# Patient Record
Sex: Male | Born: 1950 | Race: White | Hispanic: No | Marital: Married | Smoking: Current every day smoker
Health system: Southern US, Community
[De-identification: ages and names within clinical notes are randomized; demographics above are authoritative.]

## PROBLEM LIST (undated history)

## (undated) DIAGNOSIS — M1990 Unspecified osteoarthritis, unspecified site: Secondary | ICD-10-CM

## (undated) DIAGNOSIS — R1110 Vomiting, unspecified: Secondary | ICD-10-CM

## (undated) DIAGNOSIS — Z8049 Family history of malignant neoplasm of other genital organs: Secondary | ICD-10-CM

## (undated) DIAGNOSIS — S88112A Complete traumatic amputation at level between knee and ankle, left lower leg, initial encounter: Secondary | ICD-10-CM

## (undated) DIAGNOSIS — C801 Malignant (primary) neoplasm, unspecified: Secondary | ICD-10-CM

## (undated) DIAGNOSIS — R011 Cardiac murmur, unspecified: Secondary | ICD-10-CM

## (undated) DIAGNOSIS — D62 Acute posthemorrhagic anemia: Secondary | ICD-10-CM

## (undated) DIAGNOSIS — R739 Hyperglycemia, unspecified: Secondary | ICD-10-CM

## (undated) DIAGNOSIS — M2550 Pain in unspecified joint: Secondary | ICD-10-CM

## (undated) DIAGNOSIS — I10 Essential (primary) hypertension: Secondary | ICD-10-CM

## (undated) DIAGNOSIS — E119 Type 2 diabetes mellitus without complications: Secondary | ICD-10-CM

## (undated) HISTORY — PX: TONSILLECTOMY: SUR1361

## (undated) HISTORY — DX: Pain in unspecified joint: M25.50

## (undated) HISTORY — PX: ELBOW SURGERY: SHX618

## (undated) HISTORY — DX: Family history of malignant neoplasm of other genital organs: Z80.49

## (undated) HISTORY — DX: Vomiting, unspecified: R11.10

## (undated) HISTORY — DX: Essential (primary) hypertension: I10

## (undated) HISTORY — DX: Hyperglycemia, unspecified: R73.9

## (undated) HISTORY — DX: Acute posthemorrhagic anemia: D62

## (undated) HISTORY — DX: Complete traumatic amputation at level between knee and ankle, left lower leg, initial encounter: S88.112A

---

## 2006-12-21 ENCOUNTER — Emergency Department (HOSPITAL_COMMUNITY): Admission: EM | Admit: 2006-12-21 | Discharge: 2006-12-22 | Payer: Self-pay | Admitting: Emergency Medicine

## 2007-03-12 ENCOUNTER — Emergency Department (HOSPITAL_COMMUNITY): Admission: EM | Admit: 2007-03-12 | Discharge: 2007-03-12 | Payer: Self-pay | Admitting: Emergency Medicine

## 2008-02-29 ENCOUNTER — Observation Stay (HOSPITAL_COMMUNITY): Admission: EM | Admit: 2008-02-29 | Discharge: 2008-03-03 | Payer: Self-pay | Admitting: Emergency Medicine

## 2008-09-26 ENCOUNTER — Emergency Department (HOSPITAL_COMMUNITY): Admission: EM | Admit: 2008-09-26 | Discharge: 2008-09-26 | Payer: Self-pay | Admitting: Emergency Medicine

## 2011-02-07 NOTE — H&P (Signed)
NAME:  [name], [name] NO.:  [id number]   MEDICAL RECORD NO.:  [id number]          PATIENT TYPE:  EMS   LOCATION:  ED                            FACILITY:  [facility]   PHYSICIAN:  Osvaldo Shipper, MD     DATE OF BIRTH:  1951/01/12   DATE OF ADMISSION:  02/29/2008  DATE OF DISCHARGE:  LH                              HISTORY & PHYSICAL   PRIMARY CARE PHYSICIAN:  None.   ADMISSION DIAGNOSES:  1. Fall, resulting in left 7th rib fracture.  2. Possible bilateral pneumonia.   CHIEF COMPLAINT:  Fall and left-sided chest pain.   HISTORY OF PRESENT ILLNESS:  The patient is a 60 year old Caucasian male  who has really no past medical history.  He was in his usual state of  health yesterday when he was getting out of his bathtub and he slipped  and fell in his bathroom.  There was no syncopal episode associated.  He  fell on his left chest.  His pain started sometime overnight, and he  decided to come in today for further evaluation.  The patient admits to  having pain in the left side, about 8 or 10 in intensity.  This is worse  with deep breathing.  He admits to having a cough with expectoration.  He does admit to some shortness of breath.  Denies any fever or chills.  No sick contacts.   MEDICATIONS AT HOME:  None.   ALLERGIES:  NO KNOWN DRUG ALLERGIES.   PAST MEDICAL HISTORY:  Really none.  He has had surgery for a left elbow  fracture many years ago.   SOCIAL HISTORY:  He lives alone in [place].  He works as a Designer, fashion/clothing.  He smokes half a pack of cigarettes on a daily basis.  He consumes a 40  ounce bottle of beer on a daily basis; his last consumption was last  night.  He denies any illicit drug use.   FAMILY HISTORY:  Sister died of unknown cancer.   REVIEW OF SYSTEMS:  GENERAL:  Positive for weakness.  HEENT:  Unremarkable. CARDIOVASCULAR:  Unremarkable.  RESPIRATORY:  As in HPI.  GI:  Unremarkable.  GU:  Unremarkable.  MUSCULOSKELETAL:  Unremarkable.  NEUROLOGIC:  Unremarkable.  PSYCHIATRIC:  Unremarkable.   PHYSICAL EXAMINATION:  VITAL SIGNS:  Temperature 99.4, blood pressure  145/84, heart rate initially 124 and subsequently 91, respiratory rate  22, saturations 92% on room air and 99% on 2 liters.  GENERAL:  This is a well developed, well nourished white male in some  discomfort but in no distress.  HEENT:  There is no pallor.  No icterus.  Oral mucosa moist.  No oral  lesions are noted.  NECK:  Soft and supple.  No thyromegaly is appreciated.  LUNGS:  A few rhonchi bilaterally, but mostly clear to auscultation.  CARDIOVASCULAR:  S1 and S2 normal.  Regular.  No murmurs are  appreciated.  ABDOMEN:  Soft, nontender, nondistended.  Bowel sounds are present.  No  mass or organomegaly is appreciated.  EXTREMITIES:  Show no edema.  Peripheral pulses are palpable.  No  calf  tenderness is present.  NEUROLOGIC:  He is alert and oriented x3.  No focal neurological  deficits are present.   LABORATORY STUDIES:  White count 14,300, hemoglobin 16.6, platelet count  198, 90% neutrophils.  Sodium 134, glucose 188.  Chest x-ray showed mild  chronic bronchitic changes, nothing acute.  He had a CT done of his  abdomen and pelvis as well, which showed nondisplaced left lateral 7th  rib fracture, patchy airspace opacity in both lower lobes and in  appearance most compatible with pneumonia.   DIFFERENTIAL DIAGNOSIS:  Pulmonary contusion.  Minimal diffuse fatty  infiltration of the liver noted.  Moderate-sized right inguinal hernia  containing fluid.  No acute pelvic abnormalities.   ASSESSMENT AND PLAN:  This is a 60 year old Caucasian male who is fairly  healthy, though he does have some element of tobacco and alcohol abuse.  He fell yesterday on his left chest wall and was complaining of left  chest pain.  1. Evidence for left 7th rib fracture.  This is a result of the fall.      No pneumothorax is noted, though there could be some evidence  of      pulmonary contusion.  I think this warrants an observation      overnight to make sure he remains stable. A chest x-ray will be      repeated tomorrow morning, to make sure there is no pneumothorax.      Pain control with oxycodone will be provided.  Pain control will be      important to make sure he does not take shallow breaths, which      would only worsen his pneumonia. I will obviously put him on      Levaquin for this presumed pneumonia.  2. Alcoholism.  He consumes beer on a daily basis (40 ounce bottle).      I will put him on some Ativan and thiamine.  Will check his serum      alcohol levels.  3. Tobacco Abuse.  Put him on nicotine patch.   Sequential compressive devices will be utilized.  I hesitate in using  Lovenox because of the possibility of pulmonary contusion.  Labs will be  rechecked tomorrow morning.  Saturations will be checked on room air  tomorrow.   If he is stable, I think he can go home tomorrow.      Osvaldo Shipper, MD  Electronically Signed     GK/MEDQ  D:  02/29/2008  T:  02/29/2008  Job:  [id number]

## 2011-02-07 NOTE — Group Therapy Note (Signed)
NAME:  [name], [name] NO.:  [id number]   MEDICAL RECORD NO.:  [id number]          PATIENT TYPE:  OBV   LOCATION:  A308                          FACILITY:  [facility]   PHYSICIAN:  Margaretmary Dys, M.D.DATE OF BIRTH:  Dec 22, 1950   DATE OF PROCEDURE:  03/02/2008  DATE OF DISCHARGE:                                 PROGRESS NOTE   SUBJECTIVE:  The patient continues to have some pain and is having  difficulty coughing up sputum.  I am concerned that patient may develop  post atelectatic pneumonia if he does not improve his cough.   OBJECTIVE:  Conscious, alert, comfortable, not in acute distress.  Patient was generally on unkempt.  VITAL SIGNS:  Blood pressure is 127/83.  Pulse of 77.  Respiration 24.  Temperature 97 degrees Fahrenheit.  Oxygen saturation was 96% on room  air.  HEENT:  Normocephalic, atraumatic.  Oral mucosa was moist with no  exudates.  NECK:  Supple.  No JVD, no lymphadenopathy.  LUNGS:  Reduced air entry bilaterally.  Occasional crackles at the  bases.  HEART:  S1-S2 regular.  No S3, S4, gallops or rubs.  ABDOMEN:  Soft, nontender.  Bowel sounds positive.  EXTREMITIES:  No edema.   LABORATORIES/DIAGNOSTIC DATA:  White blood cell count is 9, hemoglobin  13.9, hematocrit 39.2, platelet count was 171, neutrophils 69%, sodium  136, potassium 3.3, chloride 100, CO2 was 31, glucose 170, BUN of 6,  creatinine was 0.65.  Blood cultures have remained negative thus far.   ASSESSMENT:  1. Bilateral lower lobe opacities from lung atelectasis due to      splinting of the chest after fracture ribs.  2. Chronic alcoholism and is generally unkempt patient.   PLAN:  1. We will continue antibiotic therapy with Levaquin.  His white blood      cell count has improved.  2. I again encouraged incentive spirometry.  3. We will continue with pain control.  I will continue with IV fluid      hydration.   DISPOSITION:  The patient will likely be discharged home  tomorrow  morning.      Margaretmary Dys, M.D.  Electronically Signed     AM/MEDQ  D:  03/02/2008  T:  03/02/2008  Job:  [id number]

## 2011-02-07 NOTE — Group Therapy Note (Signed)
NAME:  [name], [name] NO.:  [id number]   MEDICAL RECORD NO.:  [id number]          PATIENT TYPE:  OBV   LOCATION:  A308                          FACILITY:  [facility]   PHYSICIAN:  Margaretmary Dys, M.D.DATE OF BIRTH:  1951-03-05   DATE OF PROCEDURE:  03/01/2008  DATE OF DISCHARGE:                                 PROGRESS NOTE   SUBJECTIVE:  The patient feels slightly better.  He says there is some  more pain when he coughs.  The patient was admitted with a fractured  seventh rib and pneumonia.  The patient has significant atelectasis at  the bases which I suspect is due to splinting due to his fractured ribs.  The patient has been encouraged to use incentive spirometry.   OBJECTIVE:  Conscious, alert, comfortable, not in acute distress.  The  patient was generally unkempt.  VITAL SIGNS:  Blood pressure 137/72 with a pulse of 81, respirations 18,  temperature 98.2 degrees Fahrenheit, oxygen saturation ws 925 on room  air.  HEENT EXAM:  Normocephalic, atraumatic.  Oral mucosa was moist.  No  exudates.  NECK:  Supple.  No JVD or lymphadenopathy.  LUNGS:  Clear clinically.  HEART:  S1, S2, regular.  No S3, S4, gallops or rubs.  ABDOMEN:  Soft, nontender, bowel sounds are positive.  EXTREMITIES:  No edema.   LABORATORY/DIAGNOSTIC DATA:  His chest x-ray shows increasing bibasilar  opacities, possibly atelectasis.  White blood cell count is 15.7,  hemoglobin 14.5, hematocrit 40.6, platelet count was 178, with 81%  neutrophils.  Sodium was 137, potassium 3.7, chloride of 101, CO2 31,  glucose 185, BUN of 7, creatinine was 0.82.   ASSESSMENT AND PLAN:  1. Bilateral lower lobe opacities, possibly atelectasis from splinting      of the chest.  2. Chronic alcoholism.  3. The patient is generally unkempt.   PLAN:  1. Will continue current antibiotic therapy with Levaquin.  2. Will encourage incentive spirometry.  3. Will continue with pain control with oxycodone.  4.  Continue IV fluid hydration.  5. I discussed smoking cessation with him.   DISPOSITION:  The patient will likely be discharged home in the next 24-  48 hours.      Margaretmary Dys, M.D.  Electronically Signed     AM/MEDQ  D:  03/01/2008  T:  03/01/2008  Job:  [id number]

## 2011-04-22 ENCOUNTER — Emergency Department (HOSPITAL_COMMUNITY)
Admission: EM | Admit: 2011-04-22 | Discharge: 2011-04-22 | Disposition: A | Discharge status: Home / Self Care | Payer: Self-pay | Attending: Emergency Medicine | Admitting: Emergency Medicine

## 2011-04-22 ENCOUNTER — Encounter: Payer: Self-pay | Admitting: Emergency Medicine

## 2011-04-22 DIAGNOSIS — F17200 Nicotine dependence, unspecified, uncomplicated: Secondary | ICD-10-CM | POA: Insufficient documentation

## 2011-04-22 DIAGNOSIS — L723 Sebaceous cyst: Secondary | ICD-10-CM | POA: Insufficient documentation

## 2011-04-22 MED ORDER — DOXYCYCLINE HYCLATE 100 MG PO CAPS: 100.0000 mg | ORAL_CAPSULE | Freq: Two times a day (BID) | ORAL | Status: AC

## 2011-04-22 MED ORDER — DOXYCYCLINE HYCLATE 100 MG PO TABS
100.0000 mg | ORAL_TABLET | Freq: Once | ORAL | Status: AC
  Administered 2011-04-22: 100 mg via ORAL
  Filled 2011-04-22: qty 1

## 2011-04-22 NOTE — ED Notes (Signed)
Pt has a large abscess noted on left mid back. Pt first noted 2 weeks ago and has progressed into large painful abscess.

## 2011-04-22 NOTE — ED Provider Notes (Signed)
History     Chief Complaint  Patient presents with  . Abscess   Patient is a 60 y.o. male presenting with abscess. The history is provided by the patient. No language interpreter was used.  Abscess  This is a new problem. The current episode started more than one week ago. The onset was gradual. The problem has been gradually worsening. The abscess is present on the back. The problem is moderate. The abscess is characterized by swelling and painfulness. It is unknown what he was exposed to. The abscess first occurred at home.    History reviewed. No pertinent past medical history.  Past Surgical History  Procedure Date  . Elbow surgery     Bone graft to repair elbow    Family History  Problem Relation Age of Onset  . Cancer Sister     History  Substance Use Topics  . Smoking status: Current Everyday Smoker -- 0.5 packs/day    Types: Cigarettes  . Smokeless tobacco: Former Neurosurgeon    Types: Snuff    Quit date: 04/21/1989  . Alcohol Use: 7.2 oz/week    12 Cans of beer per week      Review of Systems  Skin:       Swollen area to L lower back.  All other systems reviewed and are negative.    Physical Exam  BP 154/96  Pulse 73  Temp(Src) 98 F (36.7 C) (Oral)  Resp 18  Ht 5\' 8"  (1.727 m)  Wt 194 lb (87.998 kg)  BMI 29.50 kg/m2  SpO2 97%  Physical Exam  Nursing note and vitals reviewed. Constitutional: He is oriented to person, place, and time. Vital signs are normal. He appears well-developed and well-nourished.  HENT:  Head: Normocephalic and atraumatic.  Right Ear: External ear normal.  Left Ear: External ear normal.  Nose: Nose normal.  Mouth/Throat: No oropharyngeal exudate.  Eyes: Conjunctivae and EOM are normal. Pupils are equal, round, and reactive to light. Right eye exhibits no discharge. Left eye exhibits no discharge. No scleral icterus.  Neck: Normal range of motion. Neck supple. No JVD present. No tracheal deviation present. No thyromegaly  present.  Cardiovascular: Normal rate, regular rhythm, normal heart sounds, intact distal pulses and normal pulses.  Exam reveals no gallop and no friction rub.   No murmur heard. Pulmonary/Chest: Effort normal and breath sounds normal. No stridor. No respiratory distress. He has no wheezes. He has no rales. He exhibits no tenderness.  Abdominal: Soft. Normal appearance and bowel sounds are normal. He exhibits no distension and no mass. There is no tenderness. There is no rebound and no guarding.  Musculoskeletal: Normal range of motion. He exhibits no edema and no tenderness.  Lymphadenopathy:    He has no cervical adenopathy.  Neurological: He is alert and oriented to person, place, and time. He has normal reflexes. No cranial nerve deficit. Coordination normal. GCS eye subscore is 4. GCS verbal subscore is 5. GCS motor subscore is 6.  Reflex Scores:      Tricep reflexes are 2+ on the right side and 2+ on the left side.      Bicep reflexes are 2+ on the right side and 2+ on the left side.      Brachioradialis reflexes are 2+ on the right side and 2+ on the left side.      Patellar reflexes are 2+ on the right side and 2+ on the left side.      Achilles reflexes are 2+ on  the right side and 2+ on the left side. Skin: Skin is warm and dry. No rash noted. He is not diaphoretic.     Psychiatric: He has a normal mood and affect. His speech is normal and behavior is normal. Judgment and thought content normal. Cognition and memory are normal.    ED Course  Procedures  MDM No distress      Worthy Rancher, PA 04/22/11 1818  Worthy Rancher, PA 04/22/11 1819  Worthy Rancher, PA 04/22/11 [phone]

## 2011-04-22 NOTE — ED Notes (Signed)
Patient c/o "knot" on back that appeared 1 week ago and has progressively gotten worse. Abscess not on mid left side of back. Patient denies any drainage or fevers. Area red and warm to touch.

## 2011-05-08 NOTE — ED Provider Notes (Signed)
Medical screening examination/treatment/procedure(s) were performed by non-physician practitioner and as supervising physician I was immediately available for consultation/collaboration.    Madicyn Mesina L Sherron Mapp, MD 05/08/11 0131 

## 2011-06-22 LAB — BASIC METABOLIC PANEL
CO2: 21
CO2: 31
Calcium: 8.6
Chloride: 101
Chloride: 102
Creatinine, Ser: 0.78
GFR calc Af Amer: >60
GFR calc Af Amer: >60
Glucose, Bld: 170 — ABNORMAL HIGH
Glucose, Bld: 188 — ABNORMAL HIGH
Potassium: 3.7
Sodium: 136
Sodium: 137

## 2011-06-22 LAB — CBC
HCT: 40.6
Hemoglobin: 13.9
Hemoglobin: 14.5
Hemoglobin: 16.6
MCHC: 35.5
MCHC: 36.1 — ABNORMAL HIGH
MCV: 91.8
MCV: 92.8
RBC: 4.37
RBC: 5.02
RDW: 13.3
RDW: 13.4
WBC: 15.7 — ABNORMAL HIGH

## 2011-06-22 LAB — DIFFERENTIAL
Basophils Absolute: 0.1
Basophils Relative: 0
Basophils Relative: 1
Eosinophils Absolute: 0
Eosinophils Absolute: 0.1
Eosinophils Relative: 1
Eosinophils Relative: 3
Lymphs Abs: 1.7
Monocytes Absolute: 0.8
Monocytes Absolute: 0.9
Monocytes Absolute: 1.2 — ABNORMAL HIGH
Monocytes Relative: 6
Monocytes Relative: 8
Neutro Abs: 6.2
Neutrophils Relative %: 81 — ABNORMAL HIGH

## 2011-06-22 LAB — CULTURE, BLOOD (ROUTINE X 2): Report Status: 6112009

## 2011-07-12 LAB — RAPID URINE DRUG SCREEN, HOSP PERFORMED
Opiates: NOT DETECTED
Tetrahydrocannabinol: NOT DETECTED

## 2011-07-12 LAB — POCT CARDIAC MARKERS
CKMB, poc: 3.4
Myoglobin, poc: 55.3
Operator id: 240821
Troponin i, poc: <0.05

## 2013-01-05 ENCOUNTER — Emergency Department (HOSPITAL_COMMUNITY)
Admission: EM | Admit: 2013-01-05 | Discharge: 2013-01-05 | Disposition: A | Discharge status: Home / Self Care | Payer: No Typology Code available for payment source | Attending: Emergency Medicine | Admitting: Emergency Medicine

## 2013-01-05 ENCOUNTER — Encounter (HOSPITAL_COMMUNITY): Payer: Self-pay | Admitting: *Deleted

## 2013-01-05 ENCOUNTER — Emergency Department (HOSPITAL_COMMUNITY): Payer: No Typology Code available for payment source

## 2013-01-05 DIAGNOSIS — R739 Hyperglycemia, unspecified: Secondary | ICD-10-CM

## 2013-01-05 DIAGNOSIS — R1110 Vomiting, unspecified: Secondary | ICD-10-CM | POA: Insufficient documentation

## 2013-01-05 DIAGNOSIS — I10 Essential (primary) hypertension: Secondary | ICD-10-CM

## 2013-01-05 DIAGNOSIS — M129 Arthropathy, unspecified: Secondary | ICD-10-CM | POA: Insufficient documentation

## 2013-01-05 DIAGNOSIS — Z9889 Other specified postprocedural states: Secondary | ICD-10-CM | POA: Insufficient documentation

## 2013-01-05 DIAGNOSIS — F17200 Nicotine dependence, unspecified, uncomplicated: Secondary | ICD-10-CM | POA: Insufficient documentation

## 2013-01-05 DIAGNOSIS — M1990 Unspecified osteoarthritis, unspecified site: Secondary | ICD-10-CM

## 2013-01-05 DIAGNOSIS — R7309 Other abnormal glucose: Secondary | ICD-10-CM | POA: Insufficient documentation

## 2013-01-05 DIAGNOSIS — R197 Diarrhea, unspecified: Secondary | ICD-10-CM | POA: Insufficient documentation

## 2013-01-05 DIAGNOSIS — R011 Cardiac murmur, unspecified: Secondary | ICD-10-CM

## 2013-01-05 LAB — CBC
MCH: 32.7 pg (ref 26.0–34.0)
MCHC: 35.3 g/dL (ref 30.0–36.0)
Platelets: 138 10*3/uL — ABNORMAL LOW (ref 150–400)
RBC: 5.6 MIL/uL (ref 4.22–5.81)
RDW: 14 % (ref 11.5–15.5)

## 2013-01-05 LAB — BASIC METABOLIC PANEL
Calcium: 9.5 mg/dL (ref 8.4–10.5)
Creatinine, Ser: 0.87 mg/dL (ref 0.50–1.35)
GFR calc Af Amer: >90 mL/min (ref >90)
GFR calc non Af Amer: >90 mL/min (ref >90)
Sodium: 137 mEq/L (ref 135–145)

## 2013-01-05 MED ORDER — HYDROCHLOROTHIAZIDE 12.5 MG PO CAPS
25.0000 mg | ORAL_CAPSULE | Freq: Every day | ORAL | Status: DC
  Filled 2013-01-05 (×2): qty 2

## 2013-01-05 MED ORDER — HYDROCHLOROTHIAZIDE 25 MG PO TABS: 25.0000 mg | ORAL_TABLET | Freq: Every day | ORAL | Status: DC

## 2013-01-05 MED ORDER — NAPROXEN 500 MG PO TABS: 500.0000 mg | ORAL_TABLET | Freq: Two times a day (BID) | ORAL | Status: DC

## 2013-01-05 MED ORDER — IBUPROFEN 400 MG PO TABS
600.0000 mg | ORAL_TABLET | Freq: Once | ORAL | Status: AC
  Administered 2013-01-05: 600 mg via ORAL
  Filled 2013-01-05: qty 2

## 2013-01-05 MED ORDER — HYDROCODONE-ACETAMINOPHEN 5-325 MG PO TABS: 1.0000 | ORAL_TABLET | ORAL | Status: DC | PRN

## 2013-01-05 MED ORDER — HYDROCHLOROTHIAZIDE 25 MG PO TABS
25.0000 mg | ORAL_TABLET | Freq: Once | ORAL | Status: AC
  Administered 2013-01-05: 25 mg via ORAL
  Filled 2013-01-05: qty 1

## 2013-01-05 MED ORDER — METFORMIN HCL 500 MG PO TABS: 500.0000 mg | ORAL_TABLET | Freq: Two times a day (BID) | ORAL | Status: DC

## 2013-01-05 MED ORDER — OXYCODONE-ACETAMINOPHEN 5-325 MG PO TABS
1.0000 | ORAL_TABLET | Freq: Once | ORAL | Status: AC
  Administered 2013-01-05: 1 via ORAL
  Filled 2013-01-05: qty 1

## 2013-01-05 NOTE — ED Provider Notes (Signed)
History  This chart was scribed for Lyanne Co, MD, by Candelaria Stagers, ED Scribe. This patient was seen in room APA12/AP[facility]12 and the patient's care was started at 1:23 PM   CSN: [id number]  Arrival date & time 01/05/13  1301   First MD Initiated Contact with Patient 01/05/13 1309      Chief Complaint  Patient presents with  . Joint Pain  . Emesis     The history is provided by the patient. No language interpreter was used.   [name] is a 62 y.o. male who presents to the Emergency Department complaining of bilateral knee pain, bilateral hip pain, and left shoulder pain that started several months ago.  Pt also reports experiencing diarrhea and vomiting over the last three weeks.  He denies any weight loss.  Pt reports the hip and knee pain is worse when getting up from a seated position.  Pt has h/o left elbow surgery with a bone graft from the right hip.  He has tried nothing to alleviate the pain.    History reviewed. No pertinent past medical history.  Past Surgical History  Procedure Laterality Date  . Elbow surgery      Bone graft to repair elbow    Family History  Problem Relation Age of Onset  . Cancer Sister     History  Substance Use Topics  . Smoking status: Current Every Day Smoker -- 0.50 packs/day    Types: Cigarettes  . Smokeless tobacco: Former Neurosurgeon    Types: Snuff    Quit date: 04/21/1989  . Alcohol Use: 7.2 oz/week    12 Cans of beer per week      Review of Systems  Gastrointestinal: Positive for vomiting and diarrhea.  Musculoskeletal: Positive for arthralgias (bilateral knee, bilateral hip, and left shoulder pain).  All other systems reviewed and are negative.    Allergies  Review of patient's allergies indicates no known allergies.  Home Medications  No current outpatient prescriptions on file.  BP 158/100  Pulse 96  Temp(Src) 98.2 F (36.8 C) (Oral)  Resp 20  Ht 5\' 11"  (1.803 m)  Wt 182 lb (82.555 kg)  BMI 25.4 kg/m2   SpO2 98%  Physical Exam  Nursing note and vitals reviewed. Constitutional: He is oriented to person, place, and time. He appears well-developed and well-nourished. No distress.  HENT:  Head: Normocephalic and atraumatic.  Eyes: EOM are normal. Pupils are equal, round, and reactive to light.  Neck: Neck supple. No tracheal deviation present.  Cardiovascular: Normal rate.   Murmur (systolic) heard. Pulmonary/Chest: Effort normal. No respiratory distress.  Abdominal: Soft. He exhibits no distension.  Musculoskeletal: Normal range of motion. He exhibits no edema.  Bull ROM of bilateral knee and bilateral hips.  No obvious joint effusion to bilateral knees. Good distal pulses.   Neurological: He is alert and oriented to person, place, and time. No sensory deficit.  Skin: Skin is warm and dry.  Psychiatric: He has a normal mood and affect. His behavior is normal.    ED Course  Procedures   DIAGNOSTIC STUDIES: Oxygen Saturation is 98% on room air, normal by my interpretation.    COORDINATION OF CARE:  1:28 PM Discussed course of care with pt which includes basic lab work, pain medication, pelvis and chest xray.  Pt understands and agrees.   2:16 PM Pt states that he ate ice cream about two hours ago.  Will discharge.  Images of pelvis are negative.  No lung  masses.   Labs Reviewed  CBC - Abnormal; Notable for the following:    Hemoglobin 18.3 (*)    Platelets 138 (*)    All other components within normal limits  BASIC METABOLIC PANEL - Abnormal; Notable for the following:    Glucose, Bld 299 (*)    All other components within normal limits   Dg Chest 2 View  01/05/2013  *RADIOLOGY REPORT*  Clinical Data: Smoker, nausea/vomiting, left hip pain  CHEST - 2 VIEW  Comparison: 03/01/2008  Findings: Lungs are clear. No pleural effusion or pneumothorax.  Cardiomediastinal silhouette is within normal limits.  Mild degenerative changes of the visualized thoracolumbar spine.  IMPRESSION: No  evidence of acute cardiopulmonary disease.   Original Report Authenticated By: Charline Bills, M.D.    Dg Pelvis 1-2 Views  01/05/2013  *RADIOLOGY REPORT*  Clinical Data: Left hip pain, prior right pelvic bone harvest  PELVIS - 1-2 VIEW  Comparison: None.  Findings: No fracture or dislocation is seen.  Visualized bony pelvis appears intact.  Mild symmetric joint space narrowing of the bilateral hips.  No changes of the lower lumbar spine.  Vascular calcifications.  IMPRESSION: No fracture or dislocation is seen.  Mild degenerative changes of the bilateral hips.   Original Report Authenticated By: Charline Bills, M.D.    I personally reviewed the imaging tests through PACS system I reviewed available ER/hospitalization records through the EMR   1. Arthritis   2. Hypertension   3. Hyperglycemia   4. Murmur, cardiac       MDM  The patient has not seen a physician in 40 years.  He appears to have uncontrolled hypertension with a blood pressure of 150/100.  He also appears to be a diabetic with a glucose of 299.  He did the 2 hours ago.  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  The patient will need a primary care physician.  Home on metformin and hydrochlorothiazide.  He understands the importance of her primary care physician.    I personally performed the services described in this documentation, which was scribed in my presence. The recorded information has been reviewed and is accurate.          Lyanne Co, MD 01/05/13 1440

## 2013-01-05 NOTE — ED Notes (Signed)
Pain to bilateral knees, hips and shoulders x 2 weeks. Vomiting intermittently x 1 week, usually vomiting twice a day. Vomited x 2 today with diarrhea today.

## 2013-01-30 ENCOUNTER — Encounter (HOSPITAL_COMMUNITY): Payer: Self-pay | Admitting: Dietician

## 2013-01-30 NOTE — Progress Notes (Signed)
[facility] Diabetes Class Completion  Date:Jan 30, 2013  Time: 1730  Pt attended [facility]'s Diabetes Group Education Class on Jan 30, 2013.   Patient was educated on the following topics: survival skills (signs and symptoms of hyperglycemia and hypoglycemia, treatment for hypoglycemia, ideal levels for fasting and postprandial blood sugars, goal Hgb A1c level, foot care basics), recommendations for physical activity, carbohydrate metabolism in relation to diabetes, and meal planning (sources of carbohydrate, carbohydrate counting, meal planning strategies, food label reading, and portion control).   Melody Haver, RD, LDN

## 2013-01-31 ENCOUNTER — Other Ambulatory Visit (HOSPITAL_COMMUNITY): Payer: Self-pay | Admitting: Cardiovascular Disease

## 2013-01-31 DIAGNOSIS — R011 Cardiac murmur, unspecified: Secondary | ICD-10-CM

## 2013-01-31 DIAGNOSIS — R0989 Other specified symptoms and signs involving the circulatory and respiratory systems: Secondary | ICD-10-CM

## 2013-02-05 ENCOUNTER — Other Ambulatory Visit (HOSPITAL_COMMUNITY): Payer: Self-pay | Admitting: Cardiovascular Disease

## 2013-02-05 DIAGNOSIS — R0989 Other specified symptoms and signs involving the circulatory and respiratory systems: Secondary | ICD-10-CM

## 2013-02-12 ENCOUNTER — Ambulatory Visit (HOSPITAL_COMMUNITY): Payer: No Typology Code available for payment source

## 2013-02-12 ENCOUNTER — Ambulatory Visit (HOSPITAL_COMMUNITY)
Admission: RE | Admit: 2013-02-12 | Discharge: 2013-02-12 | Disposition: A | Discharge status: Home / Self Care | Payer: No Typology Code available for payment source | Source: Ambulatory Visit | Attending: Cardiovascular Disease | Admitting: Cardiovascular Disease

## 2013-02-12 DIAGNOSIS — I359 Nonrheumatic aortic valve disorder, unspecified: Secondary | ICD-10-CM | POA: Insufficient documentation

## 2013-02-12 DIAGNOSIS — R011 Cardiac murmur, unspecified: Secondary | ICD-10-CM | POA: Insufficient documentation

## 2013-02-12 DIAGNOSIS — E119 Type 2 diabetes mellitus without complications: Secondary | ICD-10-CM | POA: Insufficient documentation

## 2013-02-12 DIAGNOSIS — R0989 Other specified symptoms and signs involving the circulatory and respiratory systems: Secondary | ICD-10-CM

## 2013-02-12 DIAGNOSIS — F17200 Nicotine dependence, unspecified, uncomplicated: Secondary | ICD-10-CM | POA: Insufficient documentation

## 2013-02-12 DIAGNOSIS — I517 Cardiomegaly: Secondary | ICD-10-CM | POA: Insufficient documentation

## 2013-02-12 DIAGNOSIS — I10 Essential (primary) hypertension: Secondary | ICD-10-CM | POA: Insufficient documentation

## 2013-02-12 DIAGNOSIS — I079 Rheumatic tricuspid valve disease, unspecified: Secondary | ICD-10-CM | POA: Insufficient documentation

## 2013-02-12 NOTE — Progress Notes (Signed)
Carotid Duplex Completed. Ephrem Carrick, RDMS, RVT  

## 2013-02-12 NOTE — Progress Notes (Signed)
[facility]   2D echo completed 02/12/2013.   Cindy Wynelle Dreier, RDCS  

## 2013-02-14 ENCOUNTER — Telehealth: Payer: Self-pay | Admitting: *Deleted

## 2013-02-14 NOTE — Telephone Encounter (Signed)
pts only contact # has been disconnected

## 2013-02-14 NOTE — Telephone Encounter (Signed)
Message copied by Vita Barley on Fri Feb 14, 2013  5:48 PM ------      Message from: Thurmon Fair      Created: Fri Feb 14, 2013  9:37 AM       Moderate aortic stenosis (new diagnosis). Will review yearly as long as asymptomatic (call us sooner for effort-related chest pain, dyspnea or syncope) ------

## 2013-02-21 ENCOUNTER — Telehealth: Payer: Self-pay | Admitting: Cardiovascular Disease

## 2013-02-21 NOTE — Telephone Encounter (Signed)
**  [facility] patient**  Call to [facility] at [place] office and chart pulled.  Stated Rx is for losartan-hctz 50-25mg .  Dr. Royann Shivers paged for clarification.  Page returned and Dr. Royann Shivers scrubbed in for a procedure.  Call to pharmacy and informed unable to give a response until Monday when provider in office.  Verbalized understanding.  Will forward message to Dr. Royann Shivers for review.

## 2013-02-21 NOTE — Telephone Encounter (Signed)
Office visit note on triage cart.

## 2013-02-21 NOTE — Telephone Encounter (Signed)
[name] from [facility] has a question about the dosage on Losartan hctz was written for a dosage that does not exist please call    Thanks

## 2013-02-24 MED ORDER — LOSARTAN POTASSIUM 50 MG PO TABS: 50.0000 mg | ORAL_TABLET | Freq: Every day | ORAL | Status: DC

## 2013-02-24 MED ORDER — HYDROCHLOROTHIAZIDE 25 MG PO TABS: 25.0000 mg | ORAL_TABLET | Freq: Every day | ORAL | Status: DC

## 2013-02-24 NOTE — Telephone Encounter (Signed)
Per Dr. Royann Shivers, send Rxs for losartan 50mg  daily and HCTZ 25mg  daily to pharmacy.  Call to pt and spoke w/ pt's daughter, Mat Carne, who stated pt does not have a phone.  Informed her of Rx changes and agreed to inform pt.  Daughter also with questions about [place] office closing.  Informed appts will need to be scheduled in the [place] office or we can help coordinate transferring records to another provider.  Verbalized understanding.  Rx sent to pharmacy.

## 2013-02-25 ENCOUNTER — Telehealth: Payer: Self-pay | Admitting: Cardiovascular Disease

## 2013-02-25 NOTE — Telephone Encounter (Signed)
They have a prescription for Losartan HCTZ  50/25-It does not come in that strength-Please call asap-pt is there waiting!

## 2013-02-26 ENCOUNTER — Other Ambulatory Visit: Payer: Self-pay | Admitting: *Deleted

## 2013-02-26 MED ORDER — LOSARTAN POTASSIUM 50 MG PO TABS: 50.0000 mg | ORAL_TABLET | Freq: Every day | ORAL | Status: DC

## 2013-02-26 MED ORDER — HYDROCHLOROTHIAZIDE 25 MG PO TABS: 25.0000 mg | ORAL_TABLET | Freq: Every day | ORAL | Status: DC

## 2013-02-26 NOTE — Telephone Encounter (Signed)
Medication was re-ordered and sent to the appropriate pharmacy

## 2013-02-26 NOTE — Telephone Encounter (Signed)
[facility] i[facility] calling-need strenght of his Losartan-Please call-pt needs his medicine!

## 2013-02-26 NOTE — Telephone Encounter (Signed)
rx sent to [facility] in [place]

## 2013-02-28 ENCOUNTER — Telehealth: Payer: Self-pay | Admitting: Cardiovascular Disease

## 2013-02-28 NOTE — Telephone Encounter (Signed)
Returning your call from 6-4-concerning test results! ca

## 2013-03-01 ENCOUNTER — Emergency Department (HOSPITAL_COMMUNITY)
Admission: EM | Admit: 2013-03-01 | Discharge: 2013-03-01 | Disposition: A | Discharge status: Home / Self Care | Payer: No Typology Code available for payment source | Attending: Emergency Medicine | Admitting: Emergency Medicine

## 2013-03-01 ENCOUNTER — Encounter (HOSPITAL_COMMUNITY): Payer: Self-pay | Admitting: *Deleted

## 2013-03-01 DIAGNOSIS — Z79899 Other long term (current) drug therapy: Secondary | ICD-10-CM | POA: Insufficient documentation

## 2013-03-01 DIAGNOSIS — L0291 Cutaneous abscess, unspecified: Secondary | ICD-10-CM

## 2013-03-01 DIAGNOSIS — R110 Nausea: Secondary | ICD-10-CM | POA: Insufficient documentation

## 2013-03-01 DIAGNOSIS — F17200 Nicotine dependence, unspecified, uncomplicated: Secondary | ICD-10-CM | POA: Insufficient documentation

## 2013-03-01 DIAGNOSIS — L02219 Cutaneous abscess of trunk, unspecified: Secondary | ICD-10-CM | POA: Insufficient documentation

## 2013-03-01 DIAGNOSIS — R011 Cardiac murmur, unspecified: Secondary | ICD-10-CM | POA: Insufficient documentation

## 2013-03-01 DIAGNOSIS — L03319 Cellulitis of trunk, unspecified: Secondary | ICD-10-CM | POA: Insufficient documentation

## 2013-03-01 DIAGNOSIS — E1169 Type 2 diabetes mellitus with other specified complication: Secondary | ICD-10-CM | POA: Insufficient documentation

## 2013-03-01 DIAGNOSIS — Z8739 Personal history of other diseases of the musculoskeletal system and connective tissue: Secondary | ICD-10-CM | POA: Insufficient documentation

## 2013-03-01 DIAGNOSIS — E119 Type 2 diabetes mellitus without complications: Secondary | ICD-10-CM

## 2013-03-01 HISTORY — DX: Unspecified osteoarthritis, unspecified site: M19.90

## 2013-03-01 HISTORY — DX: Type 2 diabetes mellitus without complications: E11.9

## 2013-03-01 HISTORY — DX: Cardiac murmur, unspecified: R01.1

## 2013-03-01 LAB — CBC WITH DIFFERENTIAL/PLATELET
Basophils Absolute: 0.1 10*3/uL (ref 0.0–0.1)
Basophils Relative: 1 % (ref 0–1)
Lymphocytes Relative: 20 % (ref 12–46)
MCHC: 34.6 g/dL (ref 30.0–36.0)
Neutro Abs: 7.2 10*3/uL (ref 1.7–7.7)
Platelets: 188 10*3/uL (ref 150–400)
RDW: 13.5 % (ref 11.5–15.5)
WBC: 10.6 10*3/uL — ABNORMAL HIGH (ref 4.0–10.5)

## 2013-03-01 LAB — BASIC METABOLIC PANEL
CO2: 24 mEq/L (ref 19–32)
Calcium: 9.3 mg/dL (ref 8.4–10.5)
Chloride: 102 mEq/L (ref 96–112)
Creatinine, Ser: 1.02 mg/dL (ref 0.50–1.35)
GFR calc Af Amer: >90 mL/min (ref >90)
Sodium: 136 mEq/L (ref 135–145)

## 2013-03-01 LAB — GLUCOSE, CAPILLARY: Glucose-Capillary: 257 mg/dL — ABNORMAL HIGH (ref 70–99)

## 2013-03-01 MED ORDER — LIDOCAINE HCL (PF) 1 % IJ SOLN
INTRAMUSCULAR | Status: AC
  Administered 2013-03-01: 23:00:00
  Filled 2013-03-01: qty 5

## 2013-03-01 MED ORDER — SULFAMETHOXAZOLE-TRIMETHOPRIM 800-160 MG PO TABS: ORAL_TABLET | ORAL | Status: DC

## 2013-03-01 NOTE — ED Notes (Signed)
MD at bedside. 

## 2013-03-01 NOTE — ED Provider Notes (Signed)
History    This chart was scribed for John Lennert, MD by Leone Payor, ED Scribe. This patient was seen in room APA11/[facility]11 and the patient's care was started 5:51 PM.   CSN: [id number]  Arrival date & time 03/01/13  1646   First MD Initiated Contact with Patient 03/01/13 1749      Chief Complaint  Patient presents with  . Hyperglycemia     The history is provided by the patient. No language interpreter was used.    HPI Comments: [name] is a 62 y.o. male who presents to the Emergency Department complaining of hyperglycemia that he noticed today. Pt reports he wasn't feeling well when he checked his blood sugar to be 560. He has associated nausea at this time. He also complains of an abscess to upper back. He denies fever, chills, cough, runny nose, abdominal pain. Pt has h/o DM. Pt is a current everyday smoker and occasional alcohol user.    Past Medical History  Diagnosis Date  . Diabetes mellitus without complication   . Heart murmur   . Arthritis     Past Surgical History  Procedure Laterality Date  . Elbow surgery      Bone graft to repair elbow    Family History  Problem Relation Age of Onset  . Cancer Sister     History  Substance Use Topics  . Smoking status: Current Every Day Smoker -- 0.50 packs/day    Types: Cigarettes  . Smokeless tobacco: Former Neurosurgeon    Types: Snuff    Quit date: 04/21/1989  . Alcohol Use: 7.2 oz/week    12 Cans of beer per week     Comment: 2-3 x per wk      Review of Systems  Constitutional: Negative for fever, chills, appetite change and fatigue.  HENT: Negative for congestion, sinus pressure and ear discharge.   Eyes: Negative for discharge.  Respiratory: Negative for cough.   Cardiovascular: Negative for chest pain.  Gastrointestinal: Positive for nausea. Negative for abdominal pain and diarrhea.  Genitourinary: Negative for frequency and hematuria.  Musculoskeletal: Negative for back pain.  Skin: Positive for  wound (abscess). Negative for rash.  Neurological: Negative for seizures and headaches.  Psychiatric/Behavioral: Negative for hallucinations.    Allergies  Review of patient's allergies indicates no known allergies.  Home Medications   Current Outpatient Rx  Name  Route  Sig  Dispense  Refill  . hydrochlorothiazide (HYDRODIURIL) 25 MG tablet   Oral   Take 1 tablet (25 mg total) by mouth daily.   30 tablet   6   . losartan (COZAAR) 50 MG tablet   Oral   Take 1 tablet (50 mg total) by mouth daily.   30 tablet   6   . metFORMIN (GLUCOPHAGE) 500 MG tablet   Oral   Take 1 tablet (500 mg total) by mouth 2 (two) times daily with a meal.   60 tablet   0     BP 101/83  Pulse 116  Temp(Src) 98.3 F (36.8 C) (Oral)  Resp 16  Ht 5\' 10"  (1.778 m)  Wt 180 lb (81.647 kg)  BMI 25.83 kg/m2  SpO2 96%  Physical Exam  Nursing note and vitals reviewed. Constitutional: He is oriented to person, place, and time. He appears well-developed.  HENT:  Head: Normocephalic.  Eyes: Conjunctivae and EOM are normal. No scleral icterus.  Neck: Neck supple. No thyromegaly present.  Cardiovascular: Normal rate and regular rhythm.  Exam reveals  no gallop and no friction rub.   No murmur heard. Pulmonary/Chest: No stridor. He has no wheezes. He has no rales. He exhibits no tenderness.  Abdominal: He exhibits no distension. There is no tenderness. There is no rebound.  Musculoskeletal: Normal range of motion. He exhibits no edema.  Lymphadenopathy:    He has no cervical adenopathy.  Neurological: He is oriented to person, place, and time. Coordination normal.  Skin: No rash noted. No erythema.  2 cm in diameter abscess that is reddened and tender to middle upper back.  Psychiatric: He has a normal mood and affect. His behavior is normal.    ED Course  INCISION AND DRAINAGE Date/Time: 03/01/2013 10:18 PM Performed by: Marlane Hirschmann L Authorized by: Bethann Berkshire L Comments: Pt had an  abscess to his upper back.  Pt numbed with lidocaine no epi.  #11 blade  Used to make incission and puss remove.  Packing placed #   (including critical care time)  DIAGNOSTIC STUDIES: Oxygen Saturation is 96% on room air, adequate by my interpretation.    COORDINATION OF CARE: 5:50 PM Discussed treatment plan with pt at bedside and pt agreed to plan.   Labs Reviewed  GLUCOSE, CAPILLARY - Abnormal; Notable for the following:    Glucose-Capillary 257 (*)    All other components within normal limits  CBC WITH DIFFERENTIAL - Abnormal; Notable for the following:    WBC 10.6 (*)    All other components within normal limits  BASIC METABOLIC PANEL - Abnormal; Notable for the following:    Glucose, Bld 346 (*)    GFR calc non Af Amer 78 (*)    All other components within normal limits   No results found.   No diagnosis found.    MDM     The chart was scribed for me under my direct supervision.  I personally performed the history, physical, and medical decision making and all procedures in the evaluation of this patient.John Lennert, MD 03/01/13 2219

## 2013-03-01 NOTE — ED Notes (Signed)
Approximately  3" round abscess mid back, reddened, tender.

## 2013-03-01 NOTE — ED Notes (Signed)
hyperglycemia today with home reading of 560.  Pt reports nausea at this time.  Also c/o abscess to mid back.

## 2013-03-02 ENCOUNTER — Telehealth: Payer: Self-pay | Admitting: *Deleted

## 2013-03-02 DIAGNOSIS — I350 Nonrheumatic aortic (valve) stenosis: Secondary | ICD-10-CM

## 2013-03-02 NOTE — Telephone Encounter (Signed)
Order placed for repeat echo on 1 year

## 2013-03-02 NOTE — Telephone Encounter (Signed)
Results reviewed with clay

## 2013-03-02 NOTE — Telephone Encounter (Signed)
Message copied by Marella Bile on Sun Mar 02, 2013  2:30 PM ------      Message from: Thurmon Fair      Created: Fri Feb 14, 2013  9:37 AM       Moderate aortic stenosis (new diagnosis). Will review yearly as long as asymptomatic (call us sooner for effort-related chest pain, dyspnea or syncope) ------

## 2013-03-03 ENCOUNTER — Emergency Department (HOSPITAL_COMMUNITY)
Admission: EM | Admit: 2013-03-03 | Discharge: 2013-03-03 | Disposition: A | Discharge status: Home / Self Care | Payer: No Typology Code available for payment source | Attending: Emergency Medicine | Admitting: Emergency Medicine

## 2013-03-03 ENCOUNTER — Encounter (HOSPITAL_COMMUNITY): Payer: Self-pay

## 2013-03-03 DIAGNOSIS — Z8739 Personal history of other diseases of the musculoskeletal system and connective tissue: Secondary | ICD-10-CM | POA: Insufficient documentation

## 2013-03-03 DIAGNOSIS — L0291 Cutaneous abscess, unspecified: Secondary | ICD-10-CM

## 2013-03-03 DIAGNOSIS — Y939 Activity, unspecified: Secondary | ICD-10-CM | POA: Insufficient documentation

## 2013-03-03 DIAGNOSIS — Y929 Unspecified place or not applicable: Secondary | ICD-10-CM | POA: Insufficient documentation

## 2013-03-03 DIAGNOSIS — F17200 Nicotine dependence, unspecified, uncomplicated: Secondary | ICD-10-CM | POA: Insufficient documentation

## 2013-03-03 DIAGNOSIS — S30860A Insect bite (nonvenomous) of lower back and pelvis, initial encounter: Secondary | ICD-10-CM | POA: Insufficient documentation

## 2013-03-03 DIAGNOSIS — Z4801 Encounter for change or removal of surgical wound dressing: Secondary | ICD-10-CM | POA: Insufficient documentation

## 2013-03-03 DIAGNOSIS — R011 Cardiac murmur, unspecified: Secondary | ICD-10-CM | POA: Insufficient documentation

## 2013-03-03 DIAGNOSIS — Z79899 Other long term (current) drug therapy: Secondary | ICD-10-CM | POA: Insufficient documentation

## 2013-03-03 DIAGNOSIS — W57XXXA Bitten or stung by nonvenomous insect and other nonvenomous arthropods, initial encounter: Secondary | ICD-10-CM | POA: Insufficient documentation

## 2013-03-03 DIAGNOSIS — E119 Type 2 diabetes mellitus without complications: Secondary | ICD-10-CM | POA: Insufficient documentation

## 2013-03-03 LAB — GLUCOSE, CAPILLARY: Glucose-Capillary: 220 mg/dL — ABNORMAL HIGH (ref 70–99)

## 2013-03-03 NOTE — ED Provider Notes (Signed)
History     CSN: [id number]  Arrival date & time 03/03/13  1243   First MD Initiated Contact with Patient 03/03/13 1317      Chief Complaint  Patient presents with  . packing removal     (Consider location/radiation/quality/duration/timing/severity/associated sxs/prior treatment) HPI Comments: [name] is a 62 y.o. male who presents to the Emergency Department requesting recheck and packing removal of an abscess that was drained 2 days ago.  States the pain to the area has improved.  He states he has not gotten the prescribed antibiotic filled yet but intends to do so today.  He denies fever, vomiting , increased redness or swelling ot his back.  He also c/o tick to his back and requests removal.  Pt has hx of DM, denies increased thirst, tachypnea, shortness of breath or generalized weakness.     Past Medical History  Diagnosis Date  . Diabetes mellitus without complication   . Heart murmur   . Arthritis     Past Surgical History  Procedure Laterality Date  . Elbow surgery      Bone graft to repair elbow    Family History  Problem Relation Age of Onset  . Cancer Sister     History  Substance Use Topics  . Smoking status: Current Every Day Smoker -- 0.50 packs/day    Types: Cigarettes  . Smokeless tobacco: Former Neurosurgeon    Types: Snuff    Quit date: 04/21/1989  . Alcohol Use: 7.2 oz/week    12 Cans of beer per week     Comment: daily      Review of Systems  Constitutional: Negative for fever and chills.  Gastrointestinal: Negative for nausea and vomiting.  Musculoskeletal: Negative for joint swelling and arthralgias.  Skin: Positive for color change.       Abscess   Hematological: Negative for adenopathy.  All other systems reviewed and are negative.    Allergies  Review of patient's allergies indicates no known allergies.  Home Medications   Current Outpatient Rx  Name  Route  Sig  Dispense  Refill  . hydrochlorothiazide (HYDRODIURIL) 25 MG  tablet   Oral   Take 1 tablet (25 mg total) by mouth daily.   30 tablet   6   . losartan (COZAAR) 50 MG tablet   Oral   Take 1 tablet (50 mg total) by mouth daily.   30 tablet   6   . metFORMIN (GLUCOPHAGE) 500 MG tablet   Oral   Take 1 tablet (500 mg total) by mouth 2 (two) times daily with a meal.   60 tablet   0   . sulfamethoxazole-trimethoprim (BACTRIM DS,SEPTRA DS) 800-160 MG per tablet      One po bid   14 tablet   0     BP 122/61  Pulse 89  Temp(Src) 97.8 F (36.6 C) (Oral)  Resp 17  Ht 5\' 10"  (1.778 m)  Wt 180 lb (81.647 kg)  BMI 25.83 kg/m2  SpO2 100%  Physical Exam  Nursing note and vitals reviewed. Constitutional: He is oriented to person, place, and time. He appears well-developed and well-nourished. No distress.  HENT:  Head: Normocephalic and atraumatic.  Cardiovascular: Normal rate, regular rhythm, normal heart sounds and intact distal pulses.   No murmur heard. Pulmonary/Chest: Effort normal and breath sounds normal. No respiratory distress.  Musculoskeletal: Normal range of motion.  Neurological: He is alert and oriented to person, place, and time. He exhibits normal muscle tone.  Coordination normal.  Skin: Skin is warm. There is erythema.  Abscess to the mid back.  Packing in place.  No drainage or surrounding erythema, no red streaks. Appears to be healing well.    ED Course  Procedures (including critical care time)  Labs Reviewed  GLUCOSE, CAPILLARY - Abnormal; Notable for the following:    Glucose-Capillary 220 (*)    All other components within normal limits     1. Abscess       MDM   Previous ED chart reviewed.     Packing to the abscess was removed by me w/o difficulty.  Pt tolerated procedure well.  Area was then bandaged.    Patient with abscess to the mid back and previous I&D.  Has not gotten antibiotics filled yet because "we haven't been back to town since we were here Saturday night."  States he will get the  medication filled today.  Agrees to warm water soaks.  return here if needed.  Abscess appears to be healing.  No surrounding erythema or red streaks.  No significant drainage.     Tick attached to skin of the back was removed by me completely using by fingers.  Viewed with magnification and no remaining tick parts seen.    Sevyn Markham L. Trisha Mangle, PA-C 03/03/13 2307

## 2013-03-03 NOTE — ED Notes (Signed)
Pt reports has not been able to get his antibiotics filled yet.  Also has tick on back beside dressing.

## 2013-03-03 NOTE — ED Notes (Signed)
Pt reports had abscess drained Saturday and here today for packing removal.

## 2013-03-03 NOTE — ED Notes (Signed)
Here to have packing removed from abscess to back,  Pt has not started the antibiotic.says he feels better.  Alert.NAD, A tick is present to back also

## 2013-03-04 NOTE — ED Provider Notes (Signed)
Medical screening examination/treatment/procedure(s) were performed by non-physician practitioner and as supervising physician I was immediately available for consultation/collaboration.   Glynn Octave, MD 03/04/13 [phone]

## 2015-05-20 ENCOUNTER — Encounter: Payer: Self-pay | Admitting: Cardiovascular Disease

## 2016-03-01 ENCOUNTER — Encounter (HOSPITAL_COMMUNITY): Payer: Self-pay | Admitting: Emergency Medicine

## 2016-03-01 ENCOUNTER — Emergency Department (HOSPITAL_COMMUNITY): Payer: No Typology Code available for payment source

## 2016-03-01 ENCOUNTER — Emergency Department (HOSPITAL_COMMUNITY)
Admission: EM | Admit: 2016-03-01 | Discharge: 2016-03-01 | Disposition: A | Discharge status: Home / Self Care | Payer: No Typology Code available for payment source | Attending: Emergency Medicine | Admitting: Emergency Medicine

## 2016-03-01 DIAGNOSIS — F10920 Alcohol use, unspecified with intoxication, uncomplicated: Secondary | ICD-10-CM

## 2016-03-01 DIAGNOSIS — F10220 Alcohol dependence with intoxication, uncomplicated: Secondary | ICD-10-CM | POA: Insufficient documentation

## 2016-03-01 DIAGNOSIS — L509 Urticaria, unspecified: Secondary | ICD-10-CM | POA: Insufficient documentation

## 2016-03-01 DIAGNOSIS — M1990 Unspecified osteoarthritis, unspecified site: Secondary | ICD-10-CM | POA: Insufficient documentation

## 2016-03-01 DIAGNOSIS — F17210 Nicotine dependence, cigarettes, uncomplicated: Secondary | ICD-10-CM | POA: Insufficient documentation

## 2016-03-01 DIAGNOSIS — E1165 Type 2 diabetes mellitus with hyperglycemia: Secondary | ICD-10-CM | POA: Insufficient documentation

## 2016-03-01 DIAGNOSIS — R739 Hyperglycemia, unspecified: Secondary | ICD-10-CM

## 2016-03-01 DIAGNOSIS — R4182 Altered mental status, unspecified: Secondary | ICD-10-CM | POA: Insufficient documentation

## 2016-03-01 DIAGNOSIS — Z7984 Long term (current) use of oral hypoglycemic drugs: Secondary | ICD-10-CM | POA: Insufficient documentation

## 2016-03-01 DIAGNOSIS — T7840XA Allergy, unspecified, initial encounter: Secondary | ICD-10-CM | POA: Insufficient documentation

## 2016-03-01 LAB — CBC WITH DIFFERENTIAL/PLATELET
BASOS ABS: 0.1 10*3/uL (ref 0.0–0.1)
BASOS PCT: 1 %
EOS ABS: 0.5 10*3/uL (ref 0.0–0.7)
EOS PCT: 3 %
HEMATOCRIT: 50 % (ref 39.0–52.0)
Hemoglobin: 17.3 g/dL — ABNORMAL HIGH (ref 13.0–17.0)
Lymphocytes Relative: 48 %
Lymphs Abs: 6.9 10*3/uL — ABNORMAL HIGH (ref 0.7–4.0)
MCH: 30.9 pg (ref 26.0–34.0)
MCHC: 34.6 g/dL (ref 30.0–36.0)
MCV: 89.3 fL (ref 78.0–100.0)
MONO ABS: 1.2 10*3/uL — AB (ref 0.1–1.0)
Monocytes Relative: 8 %
Neutro Abs: 5.8 10*3/uL (ref 1.7–7.7)
Neutrophils Relative %: 40 %
PLATELETS: 260 10*3/uL (ref 150–400)
RBC: 5.6 MIL/uL (ref 4.22–5.81)
RDW: 13.9 % (ref 11.5–15.5)
WBC: 14.5 10*3/uL — AB (ref 4.0–10.5)

## 2016-03-01 LAB — BASIC METABOLIC PANEL
Anion gap: 8 (ref 5–15)
BUN: 6 mg/dL (ref 6–20)
CALCIUM: 8.3 mg/dL — AB (ref 8.9–10.3)
CO2: 24 mmol/L (ref 22–32)
CREATININE: 0.85 mg/dL (ref 0.61–1.24)
Chloride: 106 mmol/L (ref 101–111)
Glucose, Bld: 390 mg/dL — ABNORMAL HIGH (ref 65–99)
Potassium: 3.4 mmol/L — ABNORMAL LOW (ref 3.5–5.1)
SODIUM: 138 mmol/L (ref 135–145)

## 2016-03-01 LAB — HEPATIC FUNCTION PANEL
ALBUMIN: 3.4 g/dL — AB (ref 3.5–5.0)
ALK PHOS: 87 U/L (ref 38–126)
ALT: 42 U/L (ref 17–63)
AST: 51 U/L — ABNORMAL HIGH (ref 15–41)
BILIRUBIN TOTAL: 0.5 mg/dL (ref 0.3–1.2)
Bilirubin, Direct: 0.1 mg/dL (ref 0.1–0.5)
Indirect Bilirubin: 0.4 mg/dL (ref 0.3–0.9)
TOTAL PROTEIN: 6.9 g/dL (ref 6.5–8.1)

## 2016-03-01 LAB — ETHANOL: ALCOHOL ETHYL (B): 215 mg/dL — AB (ref <5)

## 2016-03-01 LAB — RAPID URINE DRUG SCREEN, HOSP PERFORMED
AMPHETAMINES: NOT DETECTED
Barbiturates: NOT DETECTED
Benzodiazepines: NOT DETECTED
Cocaine: NOT DETECTED
OPIATES: NOT DETECTED
Tetrahydrocannabinol: POSITIVE — AB

## 2016-03-01 LAB — TROPONIN I: Troponin I: <0.03 ng/mL (ref <0.031)

## 2016-03-01 MED ORDER — SODIUM CHLORIDE 0.9 % IV BOLUS (SEPSIS)
1000.0000 mL | Freq: Once | INTRAVENOUS | Status: AC
  Administered 2016-03-01: 1000 mL via INTRAVENOUS

## 2016-03-01 MED ORDER — IPRATROPIUM-ALBUTEROL 0.5-2.5 (3) MG/3ML IN SOLN
3.0000 mL | Freq: Once | RESPIRATORY_TRACT | Status: AC
  Administered 2016-03-01: 3 mL via RESPIRATORY_TRACT
  Filled 2016-03-01: qty 3

## 2016-03-01 MED ORDER — FAMOTIDINE IN NACL 20-0.9 MG/50ML-% IV SOLN
20.0000 mg | Freq: Once | INTRAVENOUS | Status: AC
  Administered 2016-03-01: 20 mg via INTRAVENOUS
  Filled 2016-03-01: qty 50

## 2016-03-01 MED ORDER — THIAMINE HCL 100 MG/ML IJ SOLN
100.0000 mg | Freq: Every day | INTRAMUSCULAR | Status: DC
  Administered 2016-03-01: 100 mg via INTRAVENOUS
  Filled 2016-03-01: qty 2

## 2016-03-01 MED ORDER — LOSARTAN POTASSIUM 50 MG PO TABS: 50.0000 mg | ORAL_TABLET | Freq: Every day | ORAL | Status: DC

## 2016-03-01 MED ORDER — METHYLPREDNISOLONE SODIUM SUCC 125 MG IJ SOLR
125.0000 mg | Freq: Once | INTRAMUSCULAR | Status: AC
  Administered 2016-03-01: 125 mg via INTRAVENOUS
  Filled 2016-03-01: qty 2

## 2016-03-01 MED ORDER — INSULIN ASPART 100 UNIT/ML ~~LOC~~ SOLN
10.0000 [IU] | Freq: Once | SUBCUTANEOUS | Status: AC
  Administered 2016-03-01: 10 [IU] via INTRAVENOUS
  Filled 2016-03-01: qty 1

## 2016-03-01 MED ORDER — HYDROCHLOROTHIAZIDE 25 MG PO TABS: 25.0000 mg | ORAL_TABLET | Freq: Every day | ORAL | Status: DC

## 2016-03-01 MED ORDER — DIPHENHYDRAMINE HCL 50 MG/ML IJ SOLN
25.0000 mg | Freq: Once | INTRAMUSCULAR | Status: AC
  Administered 2016-03-01: 25 mg via INTRAVENOUS
  Filled 2016-03-01: qty 1

## 2016-03-01 MED ORDER — METFORMIN HCL 500 MG PO TABS: 500.0000 mg | ORAL_TABLET | Freq: Two times a day (BID) | ORAL | Status: DC

## 2016-03-01 NOTE — ED Notes (Signed)
Patient easily aroused with verbal stimuli. Breakfast provided.

## 2016-03-01 NOTE — ED Provider Notes (Signed)
CSN: [id number]     Arrival date & time 03/01/16  0510 History   First MD Initiated Contact with Patient 03/01/16 [phone]     Chief Complaint  Patient presents with  . Chest Pain     (Consider location/radiation/quality/duration/timing/severity/associated sxs/prior Treatment) HPI Comments: Brought to the emergency department from home. Patient initially called EMS for chest pain. EMS report upon arrival to the house, however, they ascertained that the patient was experiencing an allergic reaction. He was experiencing diffuse urticaria and had some wheezing. Patient was given Benadryl, albuterol and epinephrine during transport. At arrival to the ER patient is not answering questions appropriately as he is extremely intoxicated. Level V Caveat due to intoxication.  Patient is a 65 y.o. male presenting with chest pain.  Chest Pain   Past Medical History  Diagnosis Date  . Diabetes mellitus without complication ([place])   . Heart murmur   . Arthritis   . Hyperglycemia   . Joint pain   . Emesis    Past Surgical History  Procedure Laterality Date  . Elbow surgery      Bone graft to repair elbow   Family History  Problem Relation Age of Onset  . Cancer Sister    Social History  Substance Use Topics  . Smoking status: Current Every Day Smoker -- 0.50 packs/day    Types: Cigarettes  . Smokeless tobacco: Former Systems developer    Types: Snuff    Quit date: 04/21/1989  . Alcohol Use: 7.2 oz/week    12 Cans of beer per week     Comment: daily    Review of Systems  Unable to perform ROS: Mental status change  Cardiovascular: Positive for chest pain.      Allergies  Review of patient's allergies indicates no known allergies.  Home Medications   Prior to Admission medications   Medication Sig Start Date End Date Taking? Authorizing Provider  hydrochlorothiazide (HYDRODIURIL) 25 MG tablet Take 1 tablet (25 mg total) by mouth daily. 02/26/13   Mihai Croitoru, MD  losartan (COZAAR) 50 MG  tablet Take 1 tablet (50 mg total) by mouth daily. 02/26/13   Mihai Croitoru, MD  metFORMIN (GLUCOPHAGE) 500 MG tablet Take 1 tablet (500 mg total) by mouth 2 (two) times daily with a meal. 01/05/13   Jola Schmidt, MD  sulfamethoxazole-trimethoprim (BACTRIM DS,SEPTRA DS) 800-160 MG per tablet One po bid 03/01/13   Milton Ferguson, MD   BP 124/82 mmHg  Pulse 79  Resp 20  SpO2 93% Physical Exam  Constitutional: He appears well-developed and well-nourished. He appears listless. No distress.  HENT:  Head: Normocephalic and atraumatic.  Right Ear: Hearing normal.  Left Ear: Hearing normal.  Nose: Nose normal.  Mouth/Throat: Oropharynx is clear and moist and mucous membranes are normal.  Eyes: Conjunctivae and EOM are normal. Pupils are equal, round, and reactive to light.  Periorbital edema  Neck: Normal range of motion. Neck supple.  Cardiovascular: Regular rhythm, S1 normal and S2 normal.  Exam reveals no gallop and no friction rub.   No murmur heard. Pulmonary/Chest: Effort normal. No respiratory distress. He has wheezes. He exhibits no tenderness.  Abdominal: Soft. Normal appearance and bowel sounds are normal. There is no hepatosplenomegaly. There is no tenderness. There is no rebound, no guarding, no tenderness at McBurney's point and negative Murphy's sign. No hernia.  Musculoskeletal: Normal range of motion.  Neurological: He has normal strength. He appears listless. No cranial nerve deficit or sensory deficit. Coordination normal. GCS eye subscore  is 4. GCS verbal subscore is 4. GCS motor subscore is 6.  Skin: Skin is warm, dry and intact. Rash noted. Rash is urticarial. No cyanosis.  Psychiatric: His speech is slurred.  Nursing note and vitals reviewed.   ED Course  Procedures (including critical care time) Labs Review Labs Reviewed  CBC WITH DIFFERENTIAL/PLATELET - Abnormal; Notable for the following:    WBC 14.5 (*)    Hemoglobin 17.3 (*)    Lymphs Abs 6.9 (*)    Monocytes  Absolute 1.2 (*)    All other components within normal limits  BASIC METABOLIC PANEL - Abnormal; Notable for the following:    Potassium 3.4 (*)    Glucose, Bld 390 (*)    Calcium 8.3 (*)    All other components within normal limits  ETHANOL - Abnormal; Notable for the following:    Alcohol, Ethyl (B) 215 (*)    All other components within normal limits  HEPATIC FUNCTION PANEL - Abnormal; Notable for the following:    Albumin 3.4 (*)    AST 51 (*)    All other components within normal limits  TROPONIN I  URINE RAPID DRUG SCREEN, HOSP PERFORMED    Imaging Review Dg Chest Port 1 View  03/01/2016  CLINICAL DATA:  Initial evaluation for acute shortness of breath. EXAM: PORTABLE CHEST 1 VIEW COMPARISON:  Prior radiograph from 01/05/2013. FINDINGS: Cardiac and mediastinal silhouettes are stable in size and contour, and remain within normal limits. Lungs are mildly hypoinflated with elevation left hemidiaphragm. No focal infiltrate, pulmonary edema, or pleural effusion. No pneumothorax. No acute osseus abnormality. IMPRESSION: No active cardiopulmonary disease. Electronically Signed   By: Jeannine Boga M.D.   On: 03/01/2016 05:59   I have personally reviewed and evaluated these images and lab results as part of my medical decision-making.   EKG Interpretation   Date/Time:  Wednesday March 01 2016 [id number] EDT Ventricular Rate:  90 PR Interval:  134 QRS Duration: 84 QT Interval:  375 QTC Calculation: 459 R Axis:   47 Text Interpretation:  Sinus rhythm Normal ECG Confirmed by Tharon Bomar  MD,  Ramona Ruark [id number]) on 03/01/2016 5:32:17 AM      MDM   Final diagnoses:  Alcohol intoxication, uncomplicated (HCC)  Urticaria  Allergic reaction, initial encounter  Hyperglycemia    Patient presents to the ER for evaluation of allergic reaction. Patient has experienced urticaria and allergic reaction to unknown substance. He had facial swelling, diffuse urticaria, wheezing upon arrival of  EMS to his home. He was treated with Benadryl, albuterol and epinephrine. Patient administered additional Benadryl, Pepcid and Solu-Medrol here in the ER. He has been monitored and his urticaria have resolved and his facial swelling is also resolved. He has not had any hypotension.  Patient found to be hyperglycemic. He admits to being out of his medications. He will need refills. Patient missed her insulin and IV fluids for treatment of acute hyperglycemia. Bicarbonate is normal, no concern for DKA.  He did initially complain of chest pain, although at arrival to the ER he is not experiencing chest pain. It's not clear if he ever truly had chest pain. His EKG is normal. Troponin is negative.  She was intoxicated. He has a long history of alcohol abuse and has been drinking heavily for several days. He will be offered resources for help with his alcohol abuse.    Orpah Greek, MD 03/01/16 [phone]

## 2016-03-01 NOTE — ED Notes (Signed)
Patient with no complaints at this time. Respirations even and unlabored. Skin warm/dry. Discharge instructions reviewed with patient at this time. Patient given opportunity to voice concerns/ask questions. IV removed per policy and band-aid applied to site. Patient discharged at this time and left Emergency Department with steady gait.  

## 2016-03-01 NOTE — ED Provider Notes (Signed)
CSN: [id number]     Arrival date & time 03/01/16  0510 History   First MD Initiated Contact with Patient 03/01/16 [phone]     Chief Complaint  Patient presents with  . Chest Pain     (Consider location/radiation/quality/duration/timing/severity/associated sxs/prior Treatment) HPI  Past Medical History  Diagnosis Date  . Diabetes mellitus without complication ([place])   . Heart murmur   . Arthritis   . Hyperglycemia   . Joint pain   . Emesis    Past Surgical History  Procedure Laterality Date  . Elbow surgery      Bone graft to repair elbow   Family History  Problem Relation Age of Onset  . Cancer Sister    Social History  Substance Use Topics  . Smoking status: Current Every Day Smoker -- 0.50 packs/day    Types: Cigarettes  . Smokeless tobacco: Former Systems developer    Types: Snuff    Quit date: 04/21/1989  . Alcohol Use: 7.2 oz/week    12 Cans of beer per week     Comment: daily    Review of Systems    Allergies  Tea  Home Medications   Prior to Admission medications   Medication Sig Start Date End Date Taking? Authorizing Provider  aspirin EC 81 MG tablet Take 81 mg by mouth daily as needed (headache).   Yes Historical Provider, MD  hydrochlorothiazide (HYDRODIURIL) 25 MG tablet Take 1 tablet (25 mg total) by mouth daily. 03/01/16   Orpah Greek, MD  losartan (COZAAR) 50 MG tablet Take 1 tablet (50 mg total) by mouth daily. 03/01/16   Orpah Greek, MD  metFORMIN (GLUCOPHAGE) 500 MG tablet Take 1 tablet (500 mg total) by mouth 2 (two) times daily with a meal. 03/01/16   Orpah Greek, MD   BP 122/74 mmHg  Pulse 79  Resp 14  SpO2 93% Physical Exam  ED Course  Procedures (including critical care time) Labs Review Labs Reviewed  CBC WITH DIFFERENTIAL/PLATELET - Abnormal; Notable for the following:    WBC 14.5 (*)    Hemoglobin 17.3 (*)    Lymphs Abs 6.9 (*)    Monocytes Absolute 1.2 (*)    All other components within normal limits  BASIC  METABOLIC PANEL - Abnormal; Notable for the following:    Potassium 3.4 (*)    Glucose, Bld 390 (*)    Calcium 8.3 (*)    All other components within normal limits  ETHANOL - Abnormal; Notable for the following:    Alcohol, Ethyl (B) 215 (*)    All other components within normal limits  URINE RAPID DRUG SCREEN, HOSP PERFORMED - Abnormal; Notable for the following:    Tetrahydrocannabinol POSITIVE (*)    All other components within normal limits  HEPATIC FUNCTION PANEL - Abnormal; Notable for the following:    Albumin 3.4 (*)    AST 51 (*)    All other components within normal limits  TROPONIN I    Imaging Review Dg Chest Port 1 View  03/01/2016  CLINICAL DATA:  Initial evaluation for acute shortness of breath. EXAM: PORTABLE CHEST 1 VIEW COMPARISON:  Prior radiograph from 01/05/2013. FINDINGS: Cardiac and mediastinal silhouettes are stable in size and contour, and remain within normal limits. Lungs are mildly hypoinflated with elevation left hemidiaphragm. No focal infiltrate, pulmonary edema, or pleural effusion. No pneumothorax. No acute osseus abnormality. IMPRESSION: No active cardiopulmonary disease. Electronically Signed   By: Jeannine Boga M.D.   On: 03/01/2016 05:59  I have personally reviewed and evaluated these images and lab results as part of my medical decision-making.   EKG Interpretation   Date/Time:  Wednesday March 01 2016 [id number] EDT Ventricular Rate:  90 PR Interval:  134 QRS Duration: 84 QT Interval:  375 QTC Calculation: 459 R Axis:   47 Text Interpretation:  Sinus rhythm Normal ECG Confirmed by POLLINA  MD,  CHRISTOPHER ([id number]) on 03/01/2016 5:32:17 AM      MDM  Pt is now awake and alert.  He is able to eat and walk.  He feels ready to go.  He has no cp and his swelling has improved.   Final diagnoses:  Alcohol intoxication, uncomplicated ([place])  Urticaria  Allergic reaction, initial encounter  Hyperglycemia      Isla Pence, MD 03/01/16  [phone]

## 2016-03-01 NOTE — ED Notes (Signed)
Per ems pt has been drinking heavy since June 1st, pt c/o rash to the abd and chest pain. Pt's blood sugar was 450. Pt was given benadryl and albuterol enroute.

## 2016-03-01 NOTE — ED Notes (Signed)
Patient ate 100% of breakfast. Alert/oriented. States called wife and she is on way to get pt. Able to walk.

## 2016-03-01 NOTE — Discharge Instructions (Signed)
Allergies An allergy is an abnormal reaction to a substance by the body's defense system (immune system). Allergies can develop at any age. WHAT CAUSES ALLERGIES? An allergic reaction happens when the immune system mistakenly reacts to a normally harmless substance, called an allergen, as if it were harmful. The immune system releases antibodies to fight the substance. Antibodies eventually release a chemical called histamine into the bloodstream. The release of histamine is meant to protect the body from infection, but it also causes discomfort. An allergic reaction can be triggered by:  Eating an allergen.  Inhaling an allergen.  Touching an allergen. WHAT TYPES OF ALLERGIES ARE THERE? There are many types of allergies. Common types include:  Seasonal allergies. People with this type of allergy are usually allergic to substances that are only present during certain seasons, such as molds and pollens.  Food allergies.  Drug allergies.  Insect allergies.  Animal dander allergies. WHAT ARE SYMPTOMS OF ALLERGIES? Possible allergy symptoms include:  Swelling of the lips, face, tongue, mouth, or throat.  Sneezing, coughing, or wheezing.  Nasal congestion.  Tingling in the mouth.  Rash.  Itching.  Itchy, red, swollen areas of skin (hives).  Watery eyes.  Vomiting.  Diarrhea.  Dizziness.  Lightheadedness.  Fainting.  Trouble breathing or swallowing.  Chest tightness.  Rapid heartbeat. HOW ARE ALLERGIES DIAGNOSED? Allergies are diagnosed with a medical and family history and one or more of the following:  Skin tests.  Blood tests.  A food diary. A food diary is a record of all the foods and drinks you have in a day and of all the symptoms you experience.  The results of an elimination diet. An elimination diet involves eliminating foods from your diet and then adding them back in one by one to find out if a certain food causes an allergic reaction. HOW ARE  ALLERGIES TREATED? There is no cure for allergies, but allergic reactions can be treated with medicine. Severe reactions usually need to be treated at a hospital. HOW CAN REACTIONS BE PREVENTED? The best way to prevent an allergic reaction is by avoiding the substance you are allergic to. Allergy shots and medicines can also help prevent reactions in some cases. People with severe allergic reactions may be able to prevent a life-threatening reaction called anaphylaxis with a medicine given right after exposure to the allergen.   This information is not intended to replace advice given to you by your health care provider. Make sure you discuss any questions you have with your health care provider.   Document Released: 12/05/2002 Document Revised: 10/02/2014 Document Reviewed: 06/23/2014 Elsevier Interactive Patient Education 2016 [employer].  Alcohol Intoxication Alcohol intoxication occurs when the amount of alcohol that a person has consumed impairs his or her ability to mentally and physically function. Alcohol directly impairs the normal chemical activity of the brain. Drinking large amounts of alcohol can lead to changes in mental function and behavior, and it can cause many physical effects that can be harmful.  Alcohol intoxication can range in severity from mild to very severe. Various factors can affect the level of intoxication that occurs, such as the person's age, gender, weight, frequency of alcohol consumption, and the presence of other medical conditions (such as diabetes, seizures, or heart conditions). Dangerous levels of alcohol intoxication may occur when people drink large amounts of alcohol in a short period (binge drinking). Alcohol can also be especially dangerous when combined with certain prescription medicines or "recreational" drugs. SIGNS AND SYMPTOMS Some  common signs and symptoms of mild alcohol intoxication include:  Loss of coordination.  Changes in mood and  behavior.  Impaired judgment.  Slurred speech. As alcohol intoxication progresses to more severe levels, other signs and symptoms will appear. These may include:  Vomiting.  Confusion and impaired memory.  Slowed breathing.  Seizures.  Loss of consciousness. DIAGNOSIS  Your health care provider will take a medical history and perform a physical exam. You will be asked about the amount and type of alcohol you have consumed. Blood tests will be done to measure the concentration of alcohol in your blood. In many places, your blood alcohol level must be lower than 80 mg/dL (0.08%) to legally drive. However, many dangerous effects of alcohol can occur at much lower levels.  TREATMENT  People with alcohol intoxication often do not require treatment. Most of the effects of alcohol intoxication are temporary, and they go away as the alcohol naturally leaves the body. Your health care provider will monitor your condition until you are stable enough to go home. Fluids are sometimes given through an IV access tube to help prevent dehydration.  HOME CARE INSTRUCTIONS  Do not drive after drinking alcohol.  Stay hydrated. Drink enough water and fluids to keep your urine clear or pale yellow. Avoid caffeine.   Only take over-the-counter or prescription medicines as directed by your health care provider.  SEEK MEDICAL CARE IF:   You have persistent vomiting.   You do not feel better after a few days.  You have frequent alcohol intoxication. Your health care provider can help determine if you should see a substance use treatment counselor. SEEK IMMEDIATE MEDICAL CARE IF:   You become shaky or tremble when you try to stop drinking.   You shake uncontrollably (seizure).   You throw up (vomit) blood. This may be bright red or may look like black coffee grounds.   You have blood in your stool. This may be bright red or may appear as a black, tarry, bad smelling stool.   You become  lightheaded or faint.  MAKE SURE YOU:   Understand these instructions.  Will watch your condition.  Will get help right away if you are not doing well or get worse.   This information is not intended to replace advice given to you by your health care provider. Make sure you discuss any questions you have with your health care provider.   Document Released: 06/21/2005 Document Revised: 05/14/2013 Document Reviewed: 02/14/2013 Elsevier Interactive Patient Education 2016 Elsevier Inc.  Hyperglycemia High blood sugar (hyperglycemia) means that the level of sugar in your blood is higher than it should be. Signs of high blood sugar include:  Feeling thirsty.  Frequent peeing (urinating).  Feeling tired or sleepy.  Dry mouth.  Vision changes.  Feeling weak.  Feeling hungry but losing weight.  Numbness and tingling in your hands or feet.  Headache. When you ignore these signs, your blood sugar may keep going up. These problems may get worse, and other problems may begin. HOME CARE  Check your blood sugars as told by your doctor. Write down the numbers with the date and time.  Take the right amount of insulin or diabetes pills at the right time. Write down the dose with date and time.  Refill your insulin or diabetes pills before running out.  Watch what you eat. Follow your meal plan.  Drink liquids without sugar, such as water. Check with your doctor if you have kidney or heart  disease.  Follow your doctor's orders for exercise. Exercise at the same time of day.  Keep your doctor's appointments. GET HELP RIGHT AWAY IF:   You have trouble thinking or are confused.  You have fast breathing with fruity smelling breath.  You pass out (faint).  You have 2 to 3 days of high blood sugars and you do not know why.  You have chest pain.  You are feeling sick to your stomach (nauseous) or throwing up (vomiting).  You have sudden vision changes. MAKE SURE YOU:    Understand these instructions.  Will watch your condition.  Will get help right away if you are not doing well or get worse.   This information is not intended to replace advice given to you by your health care provider. Make sure you discuss any questions you have with your health care provider.   Document Released: 07/09/2009 Document Revised: 10/02/2014 Document Reviewed: 05/18/2015 Elsevier Interactive Patient Education 2016 [employer]. Substance Abuse Treatment Programs  Intensive Outpatient Programs [facility] Services     [address], [place]       [facility] [address] [place], [place]  [facility]     (Inpatient and outpatient)     [address]           [place] [phone] (Suboxone and Methadone)  [place], [place] [address]      [place] [address] [phone] [place], [place]  [facility] [place] (Outpatient/Inpatient, [place])    (insurance only) [phone]             [facility] ([place]) [place], [place]     [facility]     [address]     [name], [place]       [facility] (for caregivers and family) [phone] [place]een [place]. [place], [place]      Residential Treatment Programs [facility]      [address], [place], [place] [address]  [phone]       [facility] [address] [place], [place] [address] [phone]  [facility] [place]        [phone]       [facility] [place] [phone]  [facility] ([place]             [place], [place] or [place] of  [place], [address] [phone]  [facility]    [address]      [place], [place]       [facility] [address] [place], [place]  [place]   [address] [place], [place] [address]     [phone]      Admissions: 8am-3pm M-F  [facility] (RTS) [address] [place], [place]  [facility] Program: Residential Program [phone] Days)   [place], [place] or [phone]     ADATC: [facility] [place], [place] (Walk in Hours over the weekend or by referral)  [facility] [place], [place], [place] [address] [phone]  Crisis Mobile: [facility]:  [phone] (for crisis response 24 hours a day) [facility] Hotline:      [phone] Outpatient Psychiatry and Counseling  [facility]: Mobile Crisis Management 24 hours:  [phone]  [facility] of the [employer] sliding scale fee and walk in schedule: M-F 8am-12pm/1pm-3pm [place], [place] [address] [place], [place] [address] [phone]  [facility] ([facility] known as [facility])- new patient walk-in appointments available Monday - Friday 8am -3pm.          [address] [place], [place] [address] [phone] or crisis line- [facility]/ Intensive Outpatient Therapy Program [place], [place] [address] [place]      [phone] [address] [place], [place] [address]                 [place]   [facility] [phone]. [place], [place] [address]   [employer] of  Care          [address] Johnette Abraham  [place], [place] [address]       [phone]  [facility] [address], [place], [place] [address] [phone]  [facility]    [address] [place], [place] [address]     [place], [place] Joycelyn Man     [place] [address]     [phone]       [facility] [place] [address]  [facility]     [address], [place], [place], [place] [address] [place]     [address]     [place], [place] [address]     [phone]       [facility] [address] [place], [place] [address] [phone] Resources for Temporary Residential Assistance/Crisis [facility]) M-F 8am-3pm   [address] [place],  [address]   [phone] Services include: laundry, barbering, support groups, case management, phone  & computer access, showers, AA/NA mtgs, mental health/substance abuse nurse, job  skills class, disability information, VA assistance, spiritual classes, etc.   [facility]   [address], [place]     [place]              [facility] (women and children)       [place]. [place], [place] [address] [phone] [email or web address] for application and process Application Required  [facility]   [address]    [place] [address]     [phone]                    [place], [place] [address] [id number] application appt.) Application Required  [facility] (women only)    [address]     [place], [place] [address]     [phone]      Intake starts 6pm daily Need valid ID, SSC, & Police report [employer] [address] [place], [place] [id number] Application Required  [employer] (men only)     [place].      [place], [place]       [place] (Pregnant women only) [address] [place], [place]  [facility]      [place] [address].      [place], [place] [address]     [phone]             [facility] [address] [place], [place] 90 day commitment/SA/Application process  [facility](men only)     [address]     [place], [place]       Check-in at [facility] [address] [place], [place] [address] [phone] Men/Women/Women and Children must be there by 7 pm  [place], [place]

## 2018-04-23 ENCOUNTER — Emergency Department (HOSPITAL_COMMUNITY)
Admission: EM | Admit: 2018-04-23 | Discharge: 2018-04-24 | Disposition: A | Discharge status: Home / Self Care | Payer: Self-pay | Attending: Emergency Medicine | Admitting: Emergency Medicine

## 2018-04-23 ENCOUNTER — Other Ambulatory Visit: Payer: Self-pay

## 2018-04-23 ENCOUNTER — Emergency Department (HOSPITAL_COMMUNITY): Payer: Self-pay

## 2018-04-23 ENCOUNTER — Encounter (HOSPITAL_COMMUNITY): Payer: Self-pay | Admitting: Emergency Medicine

## 2018-04-23 DIAGNOSIS — M79672 Pain in left foot: Secondary | ICD-10-CM | POA: Insufficient documentation

## 2018-04-23 DIAGNOSIS — I10 Essential (primary) hypertension: Secondary | ICD-10-CM | POA: Insufficient documentation

## 2018-04-23 DIAGNOSIS — M5412 Radiculopathy, cervical region: Secondary | ICD-10-CM | POA: Insufficient documentation

## 2018-04-23 DIAGNOSIS — F17210 Nicotine dependence, cigarettes, uncomplicated: Secondary | ICD-10-CM | POA: Insufficient documentation

## 2018-04-23 DIAGNOSIS — E119 Type 2 diabetes mellitus without complications: Secondary | ICD-10-CM | POA: Insufficient documentation

## 2018-04-23 HISTORY — DX: Essential (primary) hypertension: I10

## 2018-04-23 LAB — BASIC METABOLIC PANEL WITH GFR
Anion gap: 8 (ref 5–15)
BUN: 5 mg/dL — ABNORMAL LOW (ref 8–23)
CO2: 27 mmol/L (ref 22–32)
Calcium: 8.9 mg/dL (ref 8.9–10.3)
Chloride: 108 mmol/L (ref 98–111)
Creatinine, Ser: 0.66 mg/dL (ref 0.61–1.24)
GFR calc Af Amer: >60 mL/min (ref >60)
GFR calc non Af Amer: >60 mL/min (ref >60)
Glucose, Bld: 121 mg/dL — ABNORMAL HIGH (ref 70–99)
Potassium: 3.7 mmol/L (ref 3.5–5.1)
Sodium: 143 mmol/L (ref 135–145)

## 2018-04-23 LAB — CBC WITH DIFFERENTIAL/PLATELET
BASOS PCT: 1 %
Basophils Absolute: 0 10*3/uL (ref 0.0–0.1)
EOS ABS: 0.2 10*3/uL (ref 0.0–0.7)
EOS PCT: 4 %
HCT: 43.9 % (ref 39.0–52.0)
HEMOGLOBIN: 15.3 g/dL (ref 13.0–17.0)
LYMPHS ABS: 3 10*3/uL (ref 0.7–4.0)
Lymphocytes Relative: 49 %
MCH: 31.5 pg (ref 26.0–34.0)
MCHC: 34.9 g/dL (ref 30.0–36.0)
MCV: 90.5 fL (ref 78.0–100.0)
MONOS PCT: 9 %
Monocytes Absolute: 0.5 10*3/uL (ref 0.1–1.0)
NEUTROS PCT: 37 %
Neutro Abs: 2.2 10*3/uL (ref 1.7–7.7)
PLATELETS: 202 10*3/uL (ref 150–400)
RBC: 4.85 MIL/uL (ref 4.22–5.81)
RDW: 14.6 % (ref 11.5–15.5)
WBC: 5.9 10*3/uL (ref 4.0–10.5)

## 2018-04-23 NOTE — ED Provider Notes (Signed)
[facility] EMERGENCY DEPARTMENT Provider Note   CSN: [id number] Arrival date & time: 04/23/18  2011     History   Chief Complaint Chief Complaint  Patient presents with  . Weakness    HPI [name] is a 67 y.o. male.  HPI  [name] is a 67 y.o. male who presents to the Emergency Department complaining of burning, tingling, and numbness of his left foot and tingling and stinging, tingling of his left neck that radiate into his left shoulder.  His neck and shoulder symptoms are associated with movement, but  does not radiate into his left arm or hand.  His symptoms have been present for one month.  He comes to the ER for evaluation tonight stating that his symptoms are worsening which prompted him to seek evaluation.  He denies known injury, headache, dizziness, visual changes, chest pain or shortness of breath.      Past Medical History:  Diagnosis Date  . Arthritis   . Diabetes mellitus without complication ([place])   . Emesis   . Heart murmur   . Hyperglycemia   . Hypertension   . Joint pain     There are no active problems to display for this patient.   Past Surgical History:  Procedure Laterality Date  . ELBOW SURGERY     Bone graft to repair elbow      Home Medications    Prior to Admission medications   Not on File    Family History Family History  Problem Relation Age of Onset  . Cancer Sister     Social History Social History   Tobacco Use  . Smoking status: Current Every Day Smoker    Packs/day: 0.50    Types: Cigarettes  . Smokeless tobacco: Former Systems developer    Types: Snuff    Quit date: 04/21/1989  Substance Use Topics  . Alcohol use: Yes    Alcohol/week: 7.2 oz    Types: 12 Cans of beer per week  . Drug use: No     Allergies   Tea   Review of Systems Review of Systems  Constitutional: Negative for chills and fever.  Eyes: Negative for visual disturbance.  Respiratory: Negative for cough, chest tightness and shortness  of breath.   Cardiovascular: Negative for chest pain.  Gastrointestinal: Negative for abdominal pain, nausea and vomiting.  Genitourinary: Negative for difficulty urinating and dysuria.  Musculoskeletal: Positive for arthralgias and neck pain. Negative for joint swelling and neck stiffness.  Skin: Negative for color change, rash and wound.  Neurological: Negative for dizziness, seizures, syncope, facial asymmetry, speech difficulty, weakness, numbness (numbness and tingling of left neck and left foot.  ) and headaches.  All other systems reviewed and are negative.    Physical Exam Updated Vital Signs BP 131/75 (BP Location: Right Arm)   Pulse 70   Temp 98.4 F (36.9 C) (Oral)   Resp (!) 21   Ht 5\' 11"  (1.803 m)   Wt 77.1 kg (170 lb)   SpO2 99%   BMI 23.71 kg/m   Physical Exam  Constitutional: He is oriented to person, place, and time. No distress.  Pt appears unkept   HENT:  Head: Atraumatic.  Mouth/Throat: Oropharynx is clear and moist.  Eyes: Pupils are equal, round, and reactive to light. EOM are normal.  Neck: Normal range of motion and phonation normal. No JVD present.    ttp of the left cervical paraspinal and trapezius muscles.  No bony tenderness  Cardiovascular: Normal  rate, regular rhythm and intact distal pulses.  No murmur heard. Pulmonary/Chest: Effort normal and breath sounds normal. No respiratory distress.  Abdominal: Soft. He exhibits no distension. There is no tenderness. There is no guarding.  Neurological: He is alert and oriented to person, place, and time. He has normal strength. No sensory deficit. Coordination normal. GCS eye subscore is 4. GCS verbal subscore is 5. GCS motor subscore is 6.  CN III-XII grossly intact.  Speech clear, no facial weakness,  no pronator drift, nml heel/shin, finger-nose testing.    Skin: Skin is warm. Capillary refill takes less than 2 seconds. No rash noted.  Psychiatric: He has a normal mood and affect.  Nursing note  and vitals reviewed.    ED Treatments / Results  Labs (all labs ordered are listed, but only abnormal results are displayed) Labs Reviewed  BASIC METABOLIC PANEL - Abnormal; Notable for the following components:      Result Value   Glucose, Bld 121 (*)    BUN 5 (*)    All other components within normal limits  CBC WITH DIFFERENTIAL/PLATELET    EKG None  Radiology Ct Cervical Spine Wo Contrast  Result Date: 04/23/2018 CLINICAL DATA:  Initial evaluation for left shoulder/neck tingling, numbness for 1 month. EXAM: CT CERVICAL SPINE WITHOUT CONTRAST TECHNIQUE: Multidetector CT imaging of the cervical spine was performed without intravenous contrast. Multiplanar CT image reconstructions were also generated. COMPARISON:  None. FINDINGS: Alignment: Straightening of the normal cervical lordosis. Trace anterolisthesis of C3 on C4. Skull base and vertebrae: Skull base intact normal C1-2 articulations are preserved in the dens is intact. Vertebral body heights maintained. No discrete or worrisome osseous lesions. No acute fracture. No discrete lytic or blastic osseous lesions. Soft tissues and spinal canal: Soft tissues of the neck demonstrate no acute finding. No abnormal prevertebral edema. Vascular calcifications present about the carotid bifurcations. Disc levels: C2-3: Mild uncovertebral and facet hypertrophy. No significant stenosis. C3-4: Trace anterolisthesis. Mild disc bulge. Severe left with mild right facet hypertrophy. No significant spinal stenosis. Moderate left with mild right C4 foraminal narrowing. C4-5: Mild disc bulge with uncovertebral hypertrophy, greater on the left. Advanced left-sided facet hypertrophy. Severe left C5 foraminal narrowing. Right neural foramen remains patent. Posterior disc osteophyte mildly effaces the ventral thecal sac without significant spinal stenosis. C5-6: Mild circumferential disc osteophyte with intervertebral disc space narrowing. Flattening of the  ventral thecal sac without significant spinal stenosis. Mild bilateral C6 foraminal stenosis. C6-7: Circumferential disc osteophyte with intervertebral disc space narrowing and bilateral uncovertebral hypertrophy. Mild facet hypertrophy. Flattening of the ventral thecal sac without significant spinal stenosis. Moderate bilateral C7 foraminal stenosis. C7-T1: Mild uncovertebral hypertrophy.  No stenosis. Upper chest: Visualized upper chest demonstrates no significant finding. Visualized lung apices are clear. Other: None. IMPRESSION: 1. No acute abnormality within the cervical spine. 2. Moderate to severe left C4 and C5 foraminal narrowing due to uncovertebral and facet hypertrophy, which could result in left-sided radicular symptoms. 3. Prominent left-sided facet arthrosis at C3-4 and C4-5, which could contribute to left-sided neck pain. Electronically Signed   By: Jeannine Boga M.D.   On: 04/23/2018 23:33    Procedures Procedures (including critical care time)  Medications Ordered in ED Medications - No data to display   Initial Impression / Assessment and Plan / ED Course  I have reviewed the triage vital signs and the nursing notes.  Pertinent labs & imaging results that were available during my care of the patient were reviewed by  me and considered in my medical decision making (see chart for details).     Pt with left sided radicular pain.  No concerning sx's for emergent neurological process.  NV intact.  No motor weakness on my exam.  Ambulatory with a steady gait.  Pt agrees to tx plan and given clinic referral info to establish primary care.  Return precautions discussed.   Final Clinical Impressions(s) / ED Diagnoses   Final diagnoses:  Cervical radicular pain  Left foot pain    ED Discharge Orders    None       Bufford Lope 04/24/18 Clayton, Nathan, MD 04/24/18 2318

## 2018-04-23 NOTE — ED Triage Notes (Signed)
Pt c/o right foot tingling/numbness, right shoulder/neck tingling /numbness x one month. Per ems pt's blood sugar was 132.

## 2018-04-24 MED ORDER — CYCLOBENZAPRINE HCL 5 MG PO TABS: 5.0000 mg | ORAL_TABLET | Freq: Three times a day (TID) | ORAL | 0 refills | Status: DC | PRN

## 2018-04-24 MED ORDER — PREDNISONE 10 MG PO TABS: ORAL_TABLET | ORAL | 0 refills | Status: DC

## 2018-04-24 NOTE — Discharge Instructions (Addendum)
Alternate ice and heat to your neck.  Call the clinic listed to arrange a follow-up appt.

## 2018-07-19 ENCOUNTER — Encounter (HOSPITAL_COMMUNITY): Payer: Self-pay | Admitting: *Deleted

## 2018-07-19 ENCOUNTER — Other Ambulatory Visit: Payer: Self-pay

## 2018-07-19 ENCOUNTER — Inpatient Hospital Stay (HOSPITAL_COMMUNITY)
Admission: EM | Admit: 2018-07-19 | Discharge: 2018-08-05 | DRG: 239 | Disposition: A | Discharge status: Rehabilitation Facility | Payer: Medicare Other | Attending: Family Medicine | Admitting: Family Medicine

## 2018-07-19 DIAGNOSIS — E8770 Fluid overload, unspecified: Secondary | ICD-10-CM | POA: Diagnosis not present

## 2018-07-19 DIAGNOSIS — K5900 Constipation, unspecified: Secondary | ICD-10-CM | POA: Diagnosis not present

## 2018-07-19 DIAGNOSIS — M79605 Pain in left leg: Secondary | ICD-10-CM

## 2018-07-19 DIAGNOSIS — Z6821 Body mass index (BMI) 21.0-21.9, adult: Secondary | ICD-10-CM

## 2018-07-19 DIAGNOSIS — E1152 Type 2 diabetes mellitus with diabetic peripheral angiopathy with gangrene: Principal | ICD-10-CM | POA: Diagnosis present

## 2018-07-19 DIAGNOSIS — E119 Type 2 diabetes mellitus without complications: Secondary | ICD-10-CM

## 2018-07-19 DIAGNOSIS — R9389 Abnormal findings on diagnostic imaging of other specified body structures: Secondary | ICD-10-CM

## 2018-07-19 DIAGNOSIS — I70208 Unspecified atherosclerosis of native arteries of extremities, other extremity: Secondary | ICD-10-CM | POA: Diagnosis present

## 2018-07-19 DIAGNOSIS — L03119 Cellulitis of unspecified part of limb: Secondary | ICD-10-CM

## 2018-07-19 DIAGNOSIS — E876 Hypokalemia: Secondary | ICD-10-CM | POA: Diagnosis not present

## 2018-07-19 DIAGNOSIS — Z720 Tobacco use: Secondary | ICD-10-CM

## 2018-07-19 DIAGNOSIS — E871 Hypo-osmolality and hyponatremia: Secondary | ICD-10-CM | POA: Diagnosis not present

## 2018-07-19 DIAGNOSIS — D62 Acute posthemorrhagic anemia: Secondary | ICD-10-CM

## 2018-07-19 DIAGNOSIS — F17210 Nicotine dependence, cigarettes, uncomplicated: Secondary | ICD-10-CM | POA: Diagnosis present

## 2018-07-19 DIAGNOSIS — I7092 Chronic total occlusion of artery of the extremities: Secondary | ICD-10-CM | POA: Diagnosis present

## 2018-07-19 DIAGNOSIS — L03116 Cellulitis of left lower limb: Secondary | ICD-10-CM | POA: Diagnosis present

## 2018-07-19 DIAGNOSIS — L0390 Cellulitis, unspecified: Secondary | ICD-10-CM | POA: Diagnosis present

## 2018-07-19 DIAGNOSIS — Z23 Encounter for immunization: Secondary | ICD-10-CM

## 2018-07-19 DIAGNOSIS — Z599 Problem related to housing and economic circumstances, unspecified: Secondary | ICD-10-CM

## 2018-07-19 DIAGNOSIS — R0602 Shortness of breath: Secondary | ICD-10-CM

## 2018-07-19 DIAGNOSIS — E43 Unspecified severe protein-calorie malnutrition: Secondary | ICD-10-CM | POA: Diagnosis present

## 2018-07-19 DIAGNOSIS — I998 Other disorder of circulatory system: Secondary | ICD-10-CM | POA: Diagnosis present

## 2018-07-19 DIAGNOSIS — R011 Cardiac murmur, unspecified: Secondary | ICD-10-CM | POA: Diagnosis not present

## 2018-07-19 DIAGNOSIS — I739 Peripheral vascular disease, unspecified: Secondary | ICD-10-CM

## 2018-07-19 DIAGNOSIS — I10 Essential (primary) hypertension: Secondary | ICD-10-CM | POA: Diagnosis present

## 2018-07-19 DIAGNOSIS — E11628 Type 2 diabetes mellitus with other skin complications: Secondary | ICD-10-CM | POA: Diagnosis present

## 2018-07-19 DIAGNOSIS — Z91018 Allergy to other foods: Secondary | ICD-10-CM

## 2018-07-19 DIAGNOSIS — J189 Pneumonia, unspecified organism: Secondary | ICD-10-CM

## 2018-07-19 DIAGNOSIS — J9601 Acute respiratory failure with hypoxia: Secondary | ICD-10-CM | POA: Diagnosis not present

## 2018-07-19 DIAGNOSIS — E785 Hyperlipidemia, unspecified: Secondary | ICD-10-CM | POA: Diagnosis present

## 2018-07-19 DIAGNOSIS — F17200 Nicotine dependence, unspecified, uncomplicated: Secondary | ICD-10-CM

## 2018-07-19 DIAGNOSIS — I96 Gangrene, not elsewhere classified: Secondary | ICD-10-CM | POA: Diagnosis present

## 2018-07-19 NOTE — ED Triage Notes (Signed)
States he feels like he has gangrene in his left leg

## 2018-07-20 ENCOUNTER — Encounter (HOSPITAL_COMMUNITY): Payer: Self-pay | Admitting: Internal Medicine

## 2018-07-20 ENCOUNTER — Emergency Department (HOSPITAL_COMMUNITY): Payer: Medicare Other

## 2018-07-20 DIAGNOSIS — I96 Gangrene, not elsewhere classified: Secondary | ICD-10-CM | POA: Diagnosis present

## 2018-07-20 DIAGNOSIS — L0390 Cellulitis, unspecified: Secondary | ICD-10-CM | POA: Diagnosis present

## 2018-07-20 DIAGNOSIS — E119 Type 2 diabetes mellitus without complications: Secondary | ICD-10-CM

## 2018-07-20 DIAGNOSIS — Z23 Encounter for immunization: Secondary | ICD-10-CM | POA: Diagnosis not present

## 2018-07-20 DIAGNOSIS — E871 Hypo-osmolality and hyponatremia: Secondary | ICD-10-CM | POA: Diagnosis not present

## 2018-07-20 DIAGNOSIS — M79605 Pain in left leg: Secondary | ICD-10-CM | POA: Diagnosis present

## 2018-07-20 DIAGNOSIS — Z6821 Body mass index (BMI) 21.0-21.9, adult: Secondary | ICD-10-CM | POA: Diagnosis not present

## 2018-07-20 DIAGNOSIS — J9601 Acute respiratory failure with hypoxia: Secondary | ICD-10-CM | POA: Diagnosis not present

## 2018-07-20 DIAGNOSIS — E11628 Type 2 diabetes mellitus with other skin complications: Secondary | ICD-10-CM

## 2018-07-20 DIAGNOSIS — Z599 Problem related to housing and economic circumstances, unspecified: Secondary | ICD-10-CM | POA: Diagnosis not present

## 2018-07-20 DIAGNOSIS — I998 Other disorder of circulatory system: Secondary | ICD-10-CM | POA: Diagnosis not present

## 2018-07-20 DIAGNOSIS — L03116 Cellulitis of left lower limb: Secondary | ICD-10-CM | POA: Diagnosis not present

## 2018-07-20 DIAGNOSIS — E43 Unspecified severe protein-calorie malnutrition: Secondary | ICD-10-CM | POA: Diagnosis not present

## 2018-07-20 DIAGNOSIS — I10 Essential (primary) hypertension: Secondary | ICD-10-CM | POA: Diagnosis not present

## 2018-07-20 DIAGNOSIS — E1152 Type 2 diabetes mellitus with diabetic peripheral angiopathy with gangrene: Secondary | ICD-10-CM | POA: Diagnosis not present

## 2018-07-20 DIAGNOSIS — F17210 Nicotine dependence, cigarettes, uncomplicated: Secondary | ICD-10-CM | POA: Diagnosis not present

## 2018-07-20 DIAGNOSIS — I739 Peripheral vascular disease, unspecified: Secondary | ICD-10-CM | POA: Diagnosis present

## 2018-07-20 DIAGNOSIS — L03119 Cellulitis of unspecified part of limb: Secondary | ICD-10-CM

## 2018-07-20 DIAGNOSIS — Z91018 Allergy to other foods: Secondary | ICD-10-CM | POA: Diagnosis not present

## 2018-07-20 DIAGNOSIS — E8770 Fluid overload, unspecified: Secondary | ICD-10-CM | POA: Diagnosis not present

## 2018-07-20 DIAGNOSIS — E876 Hypokalemia: Secondary | ICD-10-CM | POA: Diagnosis not present

## 2018-07-20 DIAGNOSIS — E785 Hyperlipidemia, unspecified: Secondary | ICD-10-CM | POA: Diagnosis not present

## 2018-07-20 DIAGNOSIS — K5900 Constipation, unspecified: Secondary | ICD-10-CM | POA: Diagnosis not present

## 2018-07-20 DIAGNOSIS — R011 Cardiac murmur, unspecified: Secondary | ICD-10-CM | POA: Diagnosis not present

## 2018-07-20 DIAGNOSIS — I7092 Chronic total occlusion of artery of the extremities: Secondary | ICD-10-CM | POA: Diagnosis not present

## 2018-07-20 DIAGNOSIS — I70208 Unspecified atherosclerosis of native arteries of extremities, other extremity: Secondary | ICD-10-CM | POA: Diagnosis not present

## 2018-07-20 HISTORY — DX: Type 2 diabetes mellitus with other skin complications: E11.628

## 2018-07-20 HISTORY — DX: Gangrene, not elsewhere classified: I96

## 2018-07-20 HISTORY — DX: Cellulitis, unspecified: L03.90

## 2018-07-20 HISTORY — DX: Type 2 diabetes mellitus without complications: E11.9

## 2018-07-20 LAB — I-STAT CG4 LACTIC ACID, ED
LACTIC ACID, VENOUS: 2.44 mmol/L — AB (ref 0.5–1.9)
Lactic Acid, Venous: 2.09 mmol/L (ref 0.5–1.9)

## 2018-07-20 LAB — BASIC METABOLIC PANEL
ANION GAP: 9 (ref 5–15)
BUN: 5 mg/dL — ABNORMAL LOW (ref 8–23)
CALCIUM: 8.6 mg/dL — AB (ref 8.9–10.3)
CO2: 22 mmol/L (ref 22–32)
Chloride: 109 mmol/L (ref 98–111)
Creatinine, Ser: 0.63 mg/dL (ref 0.61–1.24)
GFR calc Af Amer: >60 mL/min (ref >60)
GLUCOSE: 100 mg/dL — AB (ref 70–99)
Potassium: 3.6 mmol/L (ref 3.5–5.1)
Sodium: 140 mmol/L (ref 135–145)

## 2018-07-20 LAB — CBC WITH DIFFERENTIAL/PLATELET
ABS IMMATURE GRANULOCYTES: 0.03 10*3/uL (ref 0.00–0.07)
Basophils Absolute: 0.1 10*3/uL (ref 0.0–0.1)
Basophils Relative: 1 %
EOS ABS: 0.2 10*3/uL (ref 0.0–0.5)
Eosinophils Relative: 2 %
HEMATOCRIT: 46.8 % (ref 39.0–52.0)
HEMOGLOBIN: 15.8 g/dL (ref 13.0–17.0)
IMMATURE GRANULOCYTES: 0 %
LYMPHS ABS: 2.5 10*3/uL (ref 0.7–4.0)
Lymphocytes Relative: 28 %
MCH: 30.7 pg (ref 26.0–34.0)
MCHC: 33.8 g/dL (ref 30.0–36.0)
MCV: 90.9 fL (ref 80.0–100.0)
MONOS PCT: 8 %
Monocytes Absolute: 0.7 10*3/uL (ref 0.1–1.0)
NEUTROS PCT: 61 %
Neutro Abs: 5.3 10*3/uL (ref 1.7–7.7)
Platelets: 198 10*3/uL (ref 150–400)
RBC: 5.15 MIL/uL (ref 4.22–5.81)
RDW: 13.1 % (ref 11.5–15.5)
WBC: 8.7 10*3/uL (ref 4.0–10.5)
nRBC: 0 % (ref 0.0–0.2)

## 2018-07-20 LAB — GLUCOSE, CAPILLARY
Glucose-Capillary: 152 mg/dL — ABNORMAL HIGH (ref 70–99)
Glucose-Capillary: 154 mg/dL — ABNORMAL HIGH (ref 70–99)
Glucose-Capillary: 88 mg/dL (ref 70–99)

## 2018-07-20 LAB — COMPREHENSIVE METABOLIC PANEL
ALK PHOS: 85 U/L (ref 38–126)
ALT: 38 U/L (ref 0–44)
ANION GAP: 9 (ref 5–15)
AST: 39 U/L (ref 15–41)
Albumin: 3.6 g/dL (ref 3.5–5.0)
BUN: 7 mg/dL — ABNORMAL LOW (ref 8–23)
CALCIUM: 8.7 mg/dL — AB (ref 8.9–10.3)
CO2: 24 mmol/L (ref 22–32)
Chloride: 105 mmol/L (ref 98–111)
Creatinine, Ser: 0.6 mg/dL — ABNORMAL LOW (ref 0.61–1.24)
GFR calc Af Amer: >60 mL/min (ref >60)
GFR calc non Af Amer: >60 mL/min (ref >60)
GLUCOSE: 200 mg/dL — AB (ref 70–99)
Potassium: 3 mmol/L — ABNORMAL LOW (ref 3.5–5.1)
SODIUM: 138 mmol/L (ref 135–145)
Total Bilirubin: 0.6 mg/dL (ref 0.3–1.2)
Total Protein: 7.3 g/dL (ref 6.5–8.1)

## 2018-07-20 LAB — URINALYSIS, ROUTINE W REFLEX MICROSCOPIC
BACTERIA UA: NONE SEEN
Bilirubin Urine: NEGATIVE
Glucose, UA: >=500 mg/dL — AB
HGB URINE DIPSTICK: NEGATIVE
KETONES UR: NEGATIVE mg/dL
Leukocytes, UA: NEGATIVE
Nitrite: NEGATIVE
PH: 6 (ref 5.0–8.0)
PROTEIN: NEGATIVE mg/dL
Specific Gravity, Urine: 1.008 (ref 1.005–1.030)

## 2018-07-20 LAB — HEMOGLOBIN A1C
Hgb A1c MFr Bld: 7.3 % — ABNORMAL HIGH (ref 4.8–5.6)
Mean Plasma Glucose: 162.81 mg/dL

## 2018-07-20 LAB — HEPARIN LEVEL (UNFRACTIONATED): HEPARIN UNFRACTIONATED: 0.43 [IU]/mL (ref 0.30–0.70)

## 2018-07-20 LAB — PROTIME-INR
INR: 0.87
Prothrombin Time: 11.8 seconds (ref 11.4–15.2)

## 2018-07-20 LAB — CBG MONITORING, ED
GLUCOSE-CAPILLARY: 161 mg/dL — AB (ref 70–99)
GLUCOSE-CAPILLARY: 103 mg/dL — AB (ref 70–99)
Glucose-Capillary: 125 mg/dL — ABNORMAL HIGH (ref 70–99)
Glucose-Capillary: 109 mg/dL — ABNORMAL HIGH (ref 70–99)

## 2018-07-20 LAB — LACTIC ACID, PLASMA
LACTIC ACID, VENOUS: 1.2 mmol/L (ref 0.5–1.9)
LACTIC ACID, VENOUS: 1 mmol/L (ref 0.5–1.9)

## 2018-07-20 LAB — APTT: APTT: 35 s (ref 24–36)

## 2018-07-20 MED ORDER — INSULIN ASPART 100 UNIT/ML ~~LOC~~ SOLN
0.0000 [IU] | SUBCUTANEOUS | Status: DC
  Administered 2018-07-20: 1 [IU] via SUBCUTANEOUS
  Administered 2018-07-20 (×2): 2 [IU] via SUBCUTANEOUS
  Administered 2018-07-21: 3 [IU] via SUBCUTANEOUS
  Administered 2018-07-21 (×3): 2 [IU] via SUBCUTANEOUS

## 2018-07-20 MED ORDER — HYDRALAZINE HCL 20 MG/ML IJ SOLN
10.0000 mg | Freq: Three times a day (TID) | INTRAMUSCULAR | Status: DC | PRN
  Administered 2018-07-22 – 2018-07-26 (×3): 10 mg via INTRAVENOUS
  Filled 2018-07-20 (×2): qty 1

## 2018-07-20 MED ORDER — HEPARIN BOLUS VIA INFUSION
4000.0000 [IU] | Freq: Once | INTRAVENOUS | Status: AC
  Administered 2018-07-20: 4000 [IU] via INTRAVENOUS

## 2018-07-20 MED ORDER — ONDANSETRON HCL 4 MG/2ML IJ SOLN: 4.0000 mg | Freq: Four times a day (QID) | INTRAMUSCULAR | Status: DC | PRN

## 2018-07-20 MED ORDER — POTASSIUM CHLORIDE IN NACL 20-0.9 MEQ/L-% IV SOLN
INTRAVENOUS | Status: DC
  Administered 2018-07-20: 05:00:00 via INTRAVENOUS
  Filled 2018-07-20: qty 1000

## 2018-07-20 MED ORDER — TRAMADOL HCL 50 MG PO TABS
50.0000 mg | ORAL_TABLET | Freq: Four times a day (QID) | ORAL | Status: DC | PRN
  Administered 2018-07-22 – 2018-07-29 (×9): 50 mg via ORAL
  Filled 2018-07-20 (×11): qty 1

## 2018-07-20 MED ORDER — NICOTINE 21 MG/24HR TD PT24
21.0000 mg | MEDICATED_PATCH | Freq: Once | TRANSDERMAL | Status: DC
  Filled 2018-07-20: qty 1

## 2018-07-20 MED ORDER — SODIUM CHLORIDE 0.9 % IV SOLN
2.0000 g | Freq: Two times a day (BID) | INTRAVENOUS | Status: DC
  Administered 2018-07-20 – 2018-08-02 (×27): 2 g via INTRAVENOUS
  Filled 2018-07-20 (×28): qty 2

## 2018-07-20 MED ORDER — LACTATED RINGERS IV BOLUS
1000.0000 mL | Freq: Once | INTRAVENOUS | Status: AC
  Administered 2018-07-20: 1000 mL via INTRAVENOUS

## 2018-07-20 MED ORDER — CLINDAMYCIN PHOSPHATE 600 MG/50ML IV SOLN
600.0000 mg | Freq: Once | INTRAVENOUS | Status: AC
  Administered 2018-07-20: 600 mg via INTRAVENOUS
  Filled 2018-07-20: qty 50

## 2018-07-20 MED ORDER — SODIUM CHLORIDE 0.9 % IV SOLN
INTRAVENOUS | Status: DC
  Administered 2018-07-20 – 2018-07-24 (×7): via INTRAVENOUS

## 2018-07-20 MED ORDER — POTASSIUM CHLORIDE IN NACL 20-0.9 MEQ/L-% IV SOLN
INTRAVENOUS | Status: DC
  Administered 2018-07-20: 13:00:00 via INTRAVENOUS

## 2018-07-20 MED ORDER — HEPARIN (PORCINE) IN NACL 100-0.45 UNIT/ML-% IJ SOLN
1400.0000 [IU]/h | INTRAMUSCULAR | Status: DC
  Administered 2018-07-20: 1200 [IU]/h via INTRAVENOUS
  Administered 2018-07-21: 1400 [IU]/h via INTRAVENOUS
  Administered 2018-07-21: 1200 [IU]/h via INTRAVENOUS
  Administered 2018-07-22 – 2018-07-24 (×3): 1400 [IU]/h via INTRAVENOUS
  Filled 2018-07-20 (×8): qty 250

## 2018-07-20 MED ORDER — ACETAMINOPHEN 650 MG RE SUPP: 650.0000 mg | Freq: Four times a day (QID) | RECTAL | Status: DC | PRN

## 2018-07-20 MED ORDER — ALBUTEROL SULFATE HFA 108 (90 BASE) MCG/ACT IN AERS
2.0000 | INHALATION_SPRAY | Freq: Once | RESPIRATORY_TRACT | Status: DC
  Filled 2018-07-20: qty 6.7

## 2018-07-20 MED ORDER — VANCOMYCIN HCL IN DEXTROSE 1-5 GM/200ML-% IV SOLN
1000.0000 mg | Freq: Two times a day (BID) | INTRAVENOUS | Status: AC
  Administered 2018-07-20 – 2018-07-23 (×8): 1000 mg via INTRAVENOUS
  Filled 2018-07-20 (×8): qty 200

## 2018-07-20 MED ORDER — ACETAMINOPHEN 325 MG PO TABS
650.0000 mg | ORAL_TABLET | Freq: Four times a day (QID) | ORAL | Status: DC | PRN
  Administered 2018-07-23 – 2018-07-24 (×2): 650 mg via ORAL
  Filled 2018-07-20 (×2): qty 2

## 2018-07-20 NOTE — ED Provider Notes (Signed)
Emergency Department Provider Note   I have reviewed the triage vital signs and the nursing notes.   HISTORY  Chief Complaint Leg Pain   HPI John Westry Sr. is a 67 y.o. male who presents the emergency department today with left foot pain.  States that about a month ago he dropped a furnace on his left midfoot and had some pain initially but then over the last few days he has had some progressively worsening erythema around it did start to spread up his leg very tender.  No fevers or other systemic symptoms.  Has not seen anyone for the symptoms yet.  No other injuries.  Unknown tetanus. No other associated or modifying symptoms.    Past Medical History:  Diagnosis Date  . Arthritis   . Diabetes mellitus without complication ([place])   . Emesis   . Heart murmur   . Hyperglycemia   . Hypertension   . Joint pain     Patient Active Problem List   Diagnosis Date Noted  . PAD (peripheral artery disease) ([place]) 07/20/2018  . Cellulitis in diabetic foot ([place]) 07/20/2018  . Diabetes mellitus, type II ([place]) 07/20/2018  . Cellulitis 07/20/2018    Past Surgical History:  Procedure Laterality Date  . ELBOW SURGERY     Bone graft to repair elbow  . TONSILLECTOMY      Current Outpatient Rx  . Order #: [id number] Class: Print  . Order #: [id number] Class: Print    Allergies Tea  Family History  Problem Relation Age of Onset  . Cancer Sister     Social History Social History   Tobacco Use  . Smoking status: Current Every Day Smoker    Packs/day: 0.50    Types: Cigarettes  . Smokeless tobacco: Former Systems developer    Types: Snuff    Quit date: 04/21/1989  Substance Use Topics  . Alcohol use: Yes    Alcohol/week: 12.0 standard drinks    Types: 12 Cans of beer per week  . Drug use: No    Review of Systems  All other systems negative except as documented in the HPI. All pertinent positives and negatives as reviewed in the  HPI. ____________________________________________   PHYSICAL EXAM:  VITAL SIGNS: ED Triage Vitals  Enc Vitals Group     BP 07/19/18 2242 (!) 133/97     Pulse Rate 07/19/18 2242 (!) 105     Resp 07/19/18 2242 20     Temp 07/19/18 2242 97.8 F (36.6 C)     Temp Source 07/19/18 2242 Oral     SpO2 07/19/18 2242 100 %    Constitutional: Alert and oriented. Well appearing and in no acute distress. Eyes: Conjunctivae are normal. PERRL. EOMI. Head: Atraumatic. Nose: No congestion/rhinnorhea. Mouth/Throat: Mucous membranes are moist.  Oropharynx non-erythematous. Neck: No stridor.  No meningeal signs.   Cardiovascular: Normal rate, regular rhythm. Good peripheral circulation. Grossly normal heart sounds.   Respiratory: Normal respiratory effort.  No retractions. Lungs CTAB. Gastrointestinal: Soft and nontender. No distention.  Musculoskeletal: No lower extremity tenderness nor edema. No gross deformities of extremities. Neurologic:  Normal speech and language. No gross focal neurologic deficits are appreciated.  Skin:  Skin is warm, dry and intact. No rash noted.  See picture below for details on skin and foot exam.        ____________________________________________   LABS (all labs ordered are listed, but only abnormal results are displayed)  Labs Reviewed  COMPREHENSIVE METABOLIC PANEL - Abnormal; Notable for the following  components:      Result Value   Potassium 3.0 (*)    Glucose, Bld 200 (*)    BUN 7 (*)    Creatinine, Ser 0.60 (*)    Calcium 8.7 (*)    All other components within normal limits  URINALYSIS, ROUTINE W REFLEX MICROSCOPIC - Abnormal; Notable for the following components:   Glucose, UA >=500 (*)    All other components within normal limits  I-STAT CG4 LACTIC ACID, ED - Abnormal; Notable for the following components:   Lactic Acid, Venous 2.09 (*)    All other components within normal limits  I-STAT CG4 LACTIC ACID, ED - Abnormal; Notable for the  following components:   Lactic Acid, Venous 2.44 (*)    All other components within normal limits  CBG MONITORING, ED - Abnormal; Notable for the following components:   Glucose-Capillary 161 (*)    All other components within normal limits  CULTURE, BLOOD (ROUTINE X 2)  CULTURE, BLOOD (ROUTINE X 2)  CBC WITH DIFFERENTIAL/PLATELET  APTT  PROTIME-INR  HIV ANTIBODY (ROUTINE TESTING W REFLEX)  HEMOGLOBIN A1C   ____________________________________________  EKG   EKG Interpretation  Date/Time:  Saturday July 20 2018 00:23:06 EDT Ventricular Rate:  86 PR Interval:    QRS Duration: 88 QT Interval:  369 QTC Calculation: 442 R Axis:   20 Text Interpretation:  Sinus rhythm Atrial premature complex Borderline T wave abnormalities improved TWI in multiple leads compared to july 2019 Confirmed by Merrily Pew [id number]) on 07/20/2018 1:47:41 AM       ____________________________________________  RADIOLOGY  Dg Tibia/fibula Left  Result Date: 07/20/2018 CLINICAL DATA:  LEFT foot discoloration.  History of diabetes. EXAM: LEFT FOOT - COMPLETE 3+ VIEW; LEFT TIBIA AND FIBULA - 2 VIEW COMPARISON:  None. FINDINGS: LEFT tibia and fibula: No destructive bony lesions. Mild degenerative changes the included knee with faint periarticular calcifications. Moderate vascular calcifications. No destructive bony lesions. Soft tissue planes are non suspicious. LEFT foot: No fracture deformity or dislocation. 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 10 mm geographic lucent lesion first metatarsal head. Severe vascular calcifications. Soft tissue planes are non suspicious. IMPRESSION: 1. No fracture deformity or dislocation. No radiographic findings of osteomyelitis. 2. Cystic lesion first metatarsal head may reflect gout or geode. 3. Severe vascular calcifications. Electronically Signed   By: Elon Alas M.D.   On:  07/20/2018 00:35   Dg Foot Complete Left  Result Date: 07/20/2018 CLINICAL DATA:  LEFT foot discoloration.  History of diabetes. EXAM: LEFT FOOT - COMPLETE 3+ VIEW; LEFT TIBIA AND FIBULA - 2 VIEW COMPARISON:  None. FINDINGS: LEFT tibia and fibula: No destructive bony lesions. Mild degenerative changes the included knee with faint periarticular calcifications. Moderate vascular calcifications. No destructive bony lesions. Soft tissue planes are non suspicious. LEFT foot: No fracture deformity or dislocation. 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 10 mm geographic lucent lesion first metatarsal head. Severe vascular calcifications. Soft tissue planes are non suspicious. IMPRESSION: 1. No fracture deformity or dislocation. No radiographic findings of osteomyelitis. 2. Cystic lesion first metatarsal head may reflect gout or geode. 3. Severe vascular calcifications. Electronically Signed   By: Elon Alas M.D.   On: 07/20/2018 00:35    ____________________________________________   PROCEDURES  Procedure(s) performed:   Procedures  CRITICAL CARE Performed by: Merrily Pew Total critical care time: 35 minutes Critical care time was exclusive of  separately billable procedures and treating other patients. Critical care was necessary to treat or prevent imminent or life-threatening deterioration. Critical care was time spent personally by me on the following activities: development of treatment plan with patient and/or surrogate as well as nursing, discussions with consultants, evaluation of patient's response to treatment, examination of patient, obtaining history from patient or surrogate, ordering and performing treatments and interventions, ordering and review of laboratory studies, ordering and review of radiographic studies, pulse oximetry and re-evaluation of patient's  condition.  ____________________________________________   INITIAL IMPRESSION / ASSESSMENT AND PLAN / ED COURSE  67 year old male here with left foot pain.  Patient states that about a month ago he dropped something on it and a few days later started hurting so he thought it was related to that trauma.  He stated the pain is not gotten any better and seemed to get worse and started to spread up his leg.  After further discussion it does sound he has some claudication type symptoms for approximately a month prior to this.  States everything is been progressively worsening the last 3 to 4 weeks.  No dopplerable pulse in his left foot. at this time it seems like patient has chronic occlusion. Discussed with Dr. Carlis Abbott with vascular who recommends transfer to [facility] on medicine service for evaluation by vascular surgery for further management/workup. Discussed with hospitalist regarding the same.   Pertinent labs & imaging results that were available during my care of the patient were reviewed by me and considered in my medical decision making (see chart for details).  ____________________________________________  FINAL CLINICAL IMPRESSION(S) / ED DIAGNOSES  Final diagnoses:  Left leg pain     MEDICATIONS GIVEN DURING THIS VISIT:  Medications  heparin bolus via infusion 4,000 Units (4,000 Units Intravenous Bolus from Bag 07/20/18 0510)    Followed by  heparin ADULT infusion 100 units/mL (25000 units/233mL sodium chloride 0.45%) (1,200 Units/hr Intravenous New Bag/Given 07/20/18 0508)  0.9 % NaCl with KCl 20 mEq/ L  infusion ( Intravenous New Bag/Given 07/20/18 0504)  acetaminophen (TYLENOL) tablet 650 mg (has no administration in time range)    Or  acetaminophen (TYLENOL) suppository 650 mg (has no administration in time range)  traMADol (ULTRAM) tablet 50 mg (has no administration in time range)  ondansetron (ZOFRAN) injection 4 mg (has no administration in time range)  insulin aspart  (novoLOG) injection 0-9 Units (2 Units Subcutaneous Given 07/20/18 0530)  vancomycin (VANCOCIN) IVPB 1000 mg/200 mL premix (1,000 mg Intravenous New Bag/Given 07/20/18 0537)  ceFEPIme (MAXIPIME) 2 g in sodium chloride 0.9 % 100 mL IVPB (2 g Intravenous New Bag/Given 07/20/18 0532)  lactated ringers bolus 1,000 mL (0 mLs Intravenous Stopped 07/20/18 0156)  lactated ringers bolus 1,000 mL (0 mLs Intravenous Stopped 07/20/18 0411)  clindamycin (CLEOCIN) IVPB 600 mg (0 mg Intravenous Stopped 07/20/18 0303)     NEW OUTPATIENT MEDICATIONS STARTED DURING THIS VISIT:  New Prescriptions   No medications on file    Note:  This note was prepared with assistance of Dragon voice recognition software. Occasional wrong-word or sound-a-like substitutions may have occurred due to the inherent limitations of voice recognition software.   Abrial Arrighi, Corene Cornea, MD 07/20/18 [phone]

## 2018-07-20 NOTE — ED Notes (Signed)
Hospitalist at bedside 

## 2018-07-20 NOTE — Progress Notes (Signed)
[place] for heparin Indication: arterial occlusion  Allergies  Allergen Reactions  . Tea Itching and Rash    Patient Measurements: Height: 5\' 11"  (180.3 cm) Weight: 165 lb (74.8 kg) IBW/kg (Calculated) : 75.3 HEPARIN DW (KG): 74.8   Vital Signs: BP: 169/77 (10/26 1107) Pulse Rate: 69 (10/26 1107)  Labs: Recent Labs    07/20/18 0039 07/20/18 1227  HGB 15.8  --   HCT 46.8  --   PLT 198  --   APTT 35  --   LABPROT 11.8  --   INR 0.87  --   HEPARINUNFRC  --  0.43  CREATININE 0.60*  --    Estimated Creatinine Clearance: 96.1 mL/min (A) (by C-G formula based on SCr of 0.6 mg/dL (L)).  Medical History: Past Medical History:  Diagnosis Date  . Arthritis   . Diabetes mellitus without complication ([place])   . Emesis   . Heart murmur   . Hyperglycemia   . Hypertension   . Joint pain     Medications:  Scheduled:  . insulin aspart  0-9 Units Subcutaneous Q4H   Infusions:  . 0.9 % NaCl with KCl 20 mEq / L 65 mL/hr at 07/20/18 1249  . ceFEPime (MAXIPIME) IV Stopped (07/20/18 2992)  . heparin 1,200 Units/hr (07/20/18 0508)  . vancomycin Stopped (07/20/18 0711)   PRN:   Assessment: 67 yo male presented with left foot pain thought to be from trauma of dropping furnace on it a month ago.  No dopplerable pulse in foot, starting heparin for arterial occlusion and transfer to [facility] for vascular surgery consult.    Goal of Therapy:  Heparin level 0.3-0.7 units/ml  Monitor platelets by anticoagulation protocol: Yes   Plan:  Continue heparin infusion at 1200 units/hr Check anti-Xa level daily while on heparin Continue to monitor H&H and platelets.  Margot Ables, PharmD Clinical Pharmacist 07/20/2018 1:17 PM

## 2018-07-20 NOTE — Progress Notes (Signed)
ANTICOAGULATION CONSULT NOTE - Preliminary  Pharmacy Consult for heparin Indication: arterial occlusion  Allergies  Allergen Reactions  . Tea Itching and Rash    Patient Measurements: Height: 5\' 11"  (180.3 cm) Weight: 165 lb (74.8 kg) IBW/kg (Calculated) : 75.3 HEPARIN DW (KG): 74.8   Vital Signs: Temp: 97.8 F (36.6 C) (10/25 2242) Temp Source: Oral (10/25 2242) BP: 173/89 (10/26 0330) Pulse Rate: 88 (10/26 0213)  Labs: Recent Labs    07/20/18 0039  HGB 15.8  HCT 46.8  PLT 198  CREATININE 0.60*   Estimated Creatinine Clearance: 96.1 mL/min (A) (by C-G formula based on SCr of 0.6 mg/dL (L)).  Medical History: Past Medical History:  Diagnosis Date  . Arthritis   . Diabetes mellitus without complication ([place])   . Emesis   . Heart murmur   . Hyperglycemia   . Hypertension   . Joint pain     Medications:  Scheduled:  . heparin  4,000 Units Intravenous Once   Infusions:  . heparin     PRN:   Assessment: 67 yo male presented with left foot pain thought to be from trauma of dropping furnace on it a month ago.  No dopplerable pulse in foot, starting heparin for arterial occlusion and transfer to [facility] for vascular surgery consult.    Goal of Therapy:  Heparin level 0.3-0.7 units/ml   Plan:  Give 4000 units bolus x 1 Start heparin infusion at 1200 units/hr Check anti-Xa level in 6 hours and daily while on heparin Continue to monitor H&H and platelets Dorris Vangorder Scarlett, RPH 07/20/2018,4:15 AM

## 2018-07-20 NOTE — Progress Notes (Signed)
ANTIBIOTIC CONSULT NOTE-Preliminary  Pharmacy Consult for vancomycin and cefepime Indication: cellulitis  Allergies  Allergen Reactions  . Tea Itching and Rash    Patient Measurements: Height: 5\' 11"  (180.3 cm) Weight: 165 lb (74.8 kg) IBW/kg (Calculated) : 75.3 Adjusted Body Weight:   Vital Signs: Temp: 97.8 F (36.6 C) (10/25 2242) Temp Source: Oral (10/25 2242) BP: 178/95 (10/26 0511) Pulse Rate: 85 (10/26 0511)  Labs: Recent Labs    07/20/18 0039  WBC 8.7  HGB 15.8  PLT 198  CREATININE 0.60*    Estimated Creatinine Clearance: 96.1 mL/min (A) (by C-G formula based on SCr of 0.6 mg/dL (L)).  No results for input(s): VANCOTROUGH, VANCOPEAK, VANCORANDOM, GENTTROUGH, GENTPEAK, GENTRANDOM, TOBRATROUGH, TOBRAPEAK, TOBRARND, AMIKACINPEAK, AMIKACINTROU, AMIKACIN in the last 72 hours.   Microbiology: No results found for this or any previous visit (from the past 720 hour(s)).  Medical History: Past Medical History:  Diagnosis Date  . Arthritis   . Diabetes mellitus without complication ([place])   . Emesis   . Heart murmur   . Hyperglycemia   . Hypertension   . Joint pain     Medications:  Anti-infectives (From admission, onward)   Start     Dose/Rate Route Frequency Ordered Stop   07/20/18 0515  vancomycin (VANCOCIN) IVPB 1000 mg/200 mL premix     1,000 mg 200 mL/hr over 60 Minutes Intravenous Every 12 hours 07/20/18 0501     07/20/18 0515  ceFEPIme (MAXIPIME) 2 g in sodium chloride 0.9 % 100 mL IVPB     2 g 200 mL/hr over 30 Minutes Intravenous Every 12 hours 07/20/18 0501     07/20/18 0245  clindamycin (CLEOCIN) IVPB 600 mg     600 mg 100 mL/hr over 30 Minutes Intravenous  Once 07/20/18 0232 07/20/18 0303      Assessment: 67 yo male with T2DM came to ED complaining of left foot pain he attributed to dropping furnace on foot several months ago.  XRay shows severe vascular calcifications.  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  Vascular surgery consulted.  Pt is being transferred to [facility] for vascular consult.    Goal of Therapy:  Vancomycin trough level 10-15 mcg/ml  Plan:  Vancomycin 1 gram Q12 Cefepime 2 grams Q12  Shelanda Duvall Scarlett, RPH 07/20/2018,5:29 AM

## 2018-07-20 NOTE — Progress Notes (Signed)
1715 Received pt from [facility] via [place]. Pt is A&O x4. Left great toe grayish to black, cool to touch, redness noted at top of left foot. Faint pedal pulse with the use of doppler. Numbness noted to left toes.

## 2018-07-20 NOTE — Progress Notes (Signed)
07/19/2018 10:59 PM  07/20/2018 11:58 AM  John Laurence Sr. was seen and examined.  The H&P by the admitting provider, orders, imaging was reviewed.  The patient is waiting for transfer to [facility] for subspecialty consultation.  He is in no distress and appears comfortable.    Vitals reviewed and stable.   Vitals:   07/20/18 0800 07/20/18 1107  BP: (!) 166/85 (!) 169/77  Pulse: 80 69  Resp: (!) 22 18  Temp:    SpO2:  96%   Results for orders placed or performed during the hospital encounter of 07/19/18  Blood Culture (routine x 2)  Result Value Ref Range   Specimen Description BLOOD    Special Requests NONE    Culture      NO GROWTH < 12 HOURS Performed at [facility], [address] [place], [place] [address]    Report Status PENDING   Blood Culture (routine x 2)  Result Value Ref Range   Specimen Description BLOOD    Special Requests NONE    Culture      NO GROWTH < 12 HOURS Performed at [facility], [address] [place],  [address]    Report Status PENDING   Comprehensive metabolic panel  Result Value Ref Range   Sodium 138 135 - 145 mmol/L   Potassium 3.0 (L) 3.5 - 5.1 mmol/L   Chloride 105 98 - 111 mmol/L   CO2 24 22 - 32 mmol/L   Glucose, Bld 200 (H) 70 - 99 mg/dL   BUN 7 (L) 8 - 23 mg/dL   Creatinine, Ser 0.60 (L) 0.61 - 1.24 mg/dL   Calcium 8.7 (L) 8.9 - 10.3 mg/dL   Total Protein 7.3 6.5 - 8.1 g/dL   Albumin 3.6 3.5 - 5.0 g/dL   AST 39 15 - 41 U/L   ALT 38 0 - 44 U/L   Alkaline Phosphatase 85 38 - 126 U/L   Total Bilirubin 0.6 0.3 - 1.2 mg/dL   GFR calc non Af Amer >60 >60 mL/min   GFR calc Af Amer >60 >60 mL/min   Anion gap 9 5 - 15  CBC WITH DIFFERENTIAL  Result Value Ref Range   WBC 8.7 4.0 - 10.5 K/uL   RBC 5.15 4.22 - 5.81 MIL/uL   Hemoglobin 15.8 13.0 - 17.0 g/dL   HCT 46.8 39.0 - 52.0 %   MCV 90.9 80.0 - 100.0 fL   MCH 30.7 26.0 - 34.0 pg   MCHC 33.8 30.0 - 36.0 g/dL   RDW 13.1 11.5 - 15.5 %   Platelets 198 150 - 400 K/uL    nRBC 0.0 0.0 - 0.2 %   Neutrophils Relative % 61 %   Neutro Abs 5.3 1.7 - 7.7 K/uL   Lymphocytes Relative 28 %   Lymphs Abs 2.5 0.7 - 4.0 K/uL   Monocytes Relative 8 %   Monocytes Absolute 0.7 0.1 - 1.0 K/uL   Eosinophils Relative 2 %   Eosinophils Absolute 0.2 0.0 - 0.5 K/uL   Basophils Relative 1 %   Basophils Absolute 0.1 0.0 - 0.1 K/uL   Immature Granulocytes 0 %   Abs Immature Granulocytes 0.03 0.00 - 0.07 K/uL  Urinalysis, Routine w reflex microscopic  Result Value Ref Range   Color, Urine YELLOW YELLOW   APPearance CLEAR CLEAR   Specific Gravity, Urine 1.008 1.005 - 1.030   pH 6.0 5.0 - 8.0   Glucose, UA >=500 (A) NEGATIVE mg/dL   Hgb urine dipstick  NEGATIVE NEGATIVE   Bilirubin Urine NEGATIVE NEGATIVE   Ketones, ur NEGATIVE NEGATIVE mg/dL   Protein, ur NEGATIVE NEGATIVE mg/dL   Nitrite NEGATIVE NEGATIVE   Leukocytes, UA NEGATIVE NEGATIVE   RBC / HPF 0-5 0 - 5 RBC/hpf   WBC, UA 0-5 0 - 5 WBC/hpf   Bacteria, UA NONE SEEN NONE SEEN  APTT  Result Value Ref Range   aPTT 35 24 - 36 seconds  Protime-INR  Result Value Ref Range   Prothrombin Time 11.8 11.4 - 15.2 seconds   INR 0.87   Hemoglobin A1c  Result Value Ref Range   Hgb A1c MFr Bld 7.3 (H) 4.8 - 5.6 %   Mean Plasma Glucose 162.81 mg/dL  I-Stat CG4 Lactic Acid, ED  (not at  [facility])  Result Value Ref Range   Lactic Acid, Venous 2.09 (HH) 0.5 - 1.9 mmol/L   Comment NOTIFIED PHYSICIAN   I-Stat CG4 Lactic Acid, ED  (not at  [facility])  Result Value Ref Range   Lactic Acid, Venous 2.44 (HH) 0.5 - 1.9 mmol/L   Comment NOTIFIED PHYSICIAN   CBG monitoring, ED  Result Value Ref Range   Glucose-Capillary 161 (H) 70 - 99 mg/dL   Comment 1 Notify RN   CBG monitoring, ED  Result Value Ref Range   Glucose-Capillary 125 (H) 70 - 99 mg/dL    Murvin Natal, MD [facility]

## 2018-07-20 NOTE — ED Notes (Signed)
Date and time results received: 07/20/18 0053   Test: I-Stat Lactic Critical Value: 2.09  Name of Provider Notified: [place], MD

## 2018-07-20 NOTE — H&P (Signed)
TRH H&P   Patient Demographics:    [name], is a 67 y.o. male  MRN: [id number]   DOB - [birth year]  Admit Date - 07/19/2018  Outpatient Primary MD for the patient is Patient, No Pcp Per  Referring MD/NP/PA: Merrily Pew  Outpatient Specialists:      Patient coming from: home  Chief Complaint  Patient presents with  . Leg Pain      HPI:    [name]  is a 67 y.o. male, w dm2, who c/o left foot pain x 3 months with tip of 1st digit left foot black and discolored.  Pt apparently presented to ED due to redness on the dorsum of the left foot. Pt notes trauma about 67months ago when he dropped something on his foot.  Pt denies fever, chills, cp, palp, sob, n/v, diarrhea, brbpr.  Pt can't afford diabetic medication but can apparently afford cigarettes.    In ED,  T 97.8 P 88 Bp 173/89  Pox 98% on RA  Xray L foot IMPRESSION: 1. No fracture deformity or dislocation. No radiographic findings of osteomyelitis. 2. Cystic lesion first metatarsal head may reflect gout or geode. 3. Severe vascular calcifications.  Na 138, K 3.0, Bun 7, Creatinine 0.60 Ast 39, Alt 38  Glucose 200 Lactic acid 2.09 Urinalysis negative  Wbc 8.7, Hgb 15.8, Plt 198  Pt will be admitted for cellulitis and dry gangrene tip of 1st toe left foot due to presumed PAD   Review of systems:    In addition to the HPI above,  No Fever-chills, No Headache, No changes with Vision or hearing, No problems swallowing food or Liquids, No Chest pain, Cough or Shortness of Breath, No Abdominal pain, No Nausea or Vommitting, Bowel movements are regular, No Blood in stool or Urine, No dysuria,  , No new joints pains-aches,  No new weakness, tingling, numbness in any extremity, No recent weight gain or loss, No polyuria, polydypsia or polyphagia, No significant Mental Stressors.  A full 10 point  Review of Systems was done, except as stated above, all other Review of Systems were negative.   With Past History of the following :    Past Medical History:  Diagnosis Date  . Arthritis   . Diabetes mellitus without complication ([place])   . Emesis   . Heart murmur   . Hyperglycemia   . Hypertension   . Joint pain       Past Surgical History:  Procedure Laterality Date  . ELBOW SURGERY     Bone graft to repair elbow  . TONSILLECTOMY        Social History:     Social History   Tobacco Use  . Smoking status: Current Every Day Smoker    Packs/day: 0.50    Types: Cigarettes  . Smokeless tobacco: Former Systems developer    Types: Snuff    Quit  date: 04/21/1989  Substance Use Topics  . Alcohol use: Yes    Alcohol/week: 12.0 standard drinks    Types: 12 Cans of beer per week     Lives - at home  Mobility - walks by self   Family History :     Family History  Problem Relation Age of Onset  . Cancer Sister        Home Medications:   Prior to Admission medications   Medication Sig Start Date End Date Taking? Authorizing Provider  cyclobenzaprine (FLEXERIL) 5 MG tablet Take 1 tablet (5 mg total) by mouth 3 (three) times daily as needed. 04/24/18   Triplett, Tammy, PA-C  predniSONE (DELTASONE) 10 MG tablet Take 6 tablets day one, 5 tablets day two, 4 tablets day three, 3 tablets day four, 2 tablets day five, then 1 tablet day six 04/24/18   Triplett, Tammy, PA-C     Allergies:     Allergies  Allergen Reactions  . Tea Itching and Rash     Physical Exam:   Vitals  Blood pressure (!) 173/89, pulse 88, temperature 97.8 F (36.6 C), temperature source Oral, resp. rate (!) 26, height 5\' 11"  (1.803 m), weight 74.8 kg, SpO2 98 %.   1. General  lying in bed in NAD,   2. Normal affect and insight, Not Suicidal or Homicidal, Awake Alert, Oriented X 3.  3. No F.N deficits, ALL C.Nerves Intact, Strength 5/5 all 4 extremities, Sensation intact all 4 extremities, Plantars  down going.  4. Ears and Eyes appear Normal, Conjunctivae clear, PERRLA. Moist Oral Mucosa.  5. Supple Neck, No JVD, No cervical lymphadenopathy appriciated, No Carotid Bruits.  6. Symmetrical Chest wall movement, Good air movement bilaterally, CTAB.  7. RRR, No Gallops, Rubs or Murmurs, No Parasternal Heave.  8. Positive Bowel Sounds, Abdomen Soft, No tenderness, No organomegaly appriciated,No rebound -guarding or rigidity.  9.  No Cyanosis,  dp pulse not palpable, pt pulse not palpable  Erythema over the dorsum of the left foot   10. Good muscle tone,  joints appear normal , no effusions, Normal ROM.  11. No Palpable Lymph Nodes in Neck or Axillae     Data Review:    CBC Recent Labs  Lab 07/20/18 0039  WBC 8.7  HGB 15.8  HCT 46.8  PLT 198  MCV 90.9  MCH 30.7  MCHC 33.8  RDW 13.1  LYMPHSABS 2.5  MONOABS 0.7  EOSABS 0.2  BASOSABS 0.1   ------------------------------------------------------------------------------------------------------------------  Chemistries  Recent Labs  Lab 07/20/18 0039  NA 138  K 3.0*  CL 105  CO2 24  GLUCOSE 200*  BUN 7*  CREATININE 0.60*  CALCIUM 8.7*  AST 39  ALT 38  ALKPHOS 85  BILITOT 0.6   ------------------------------------------------------------------------------------------------------------------ estimated creatinine clearance is 96.1 mL/min (A) (by C-G formula based on SCr of 0.6 mg/dL (L)). ------------------------------------------------------------------------------------------------------------------ No results for input(s): TSH, T4TOTAL, T3FREE, THYROIDAB in the last 72 hours.  Invalid input(s): FREET3  Coagulation profile No results for input(s): INR, PROTIME in the last 168 hours. ------------------------------------------------------------------------------------------------------------------- No results for input(s): DDIMER in the last 72  hours. -------------------------------------------------------------------------------------------------------------------  Cardiac Enzymes No results for input(s): CKMB, TROPONINI, MYOGLOBIN in the last 168 hours.  Invalid input(s): CK ------------------------------------------------------------------------------------------------------------------ No results found for: BNP   ---------------------------------------------------------------------------------------------------------------  Urinalysis    Component Value Date/Time   COLORURINE YELLOW 07/20/2018 0210   APPEARANCEUR CLEAR 07/20/2018 0210   LABSPEC 1.008 07/20/2018 0210   PHURINE 6.0 07/20/2018 0210   GLUCOSEU >=500 (  A) 07/20/2018 0210   HGBUR NEGATIVE 07/20/2018 0210   BILIRUBINUR NEGATIVE 07/20/2018 0210   KETONESUR NEGATIVE 07/20/2018 0210   PROTEINUR NEGATIVE 07/20/2018 0210   NITRITE NEGATIVE 07/20/2018 0210   LEUKOCYTESUR NEGATIVE 07/20/2018 0210    ----------------------------------------------------------------------------------------------------------------   Imaging Results:    Dg Tibia/fibula Left  Result Date: 07/20/2018 CLINICAL DATA:  LEFT foot discoloration.  History of diabetes. EXAM: LEFT FOOT - COMPLETE 3+ VIEW; LEFT TIBIA AND FIBULA - 2 VIEW COMPARISON:  None. FINDINGS: LEFT tibia and fibula: No destructive bony lesions. Mild degenerative changes the included knee with faint periarticular calcifications. Moderate vascular calcifications. No destructive bony lesions. Soft tissue planes are non suspicious. LEFT foot: No fracture deformity or dislocation. 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 10 mm geographic lucent lesion first metatarsal head. Severe vascular calcifications. Soft tissue planes are non suspicious. IMPRESSION: 1. No fracture deformity or dislocation. No radiographic findings of osteomyelitis. 2. Cystic  lesion first metatarsal head may reflect gout or geode. 3. Severe vascular calcifications. Electronically Signed   By: Elon Alas M.D.   On: 07/20/2018 00:35   Dg Foot Complete Left  Result Date: 07/20/2018 CLINICAL DATA:  LEFT foot discoloration.  History of diabetes. EXAM: LEFT FOOT - COMPLETE 3+ VIEW; LEFT TIBIA AND FIBULA - 2 VIEW COMPARISON:  None. FINDINGS: LEFT tibia and fibula: No destructive bony lesions. Mild degenerative changes the included knee with faint periarticular calcifications. Moderate vascular calcifications. No destructive bony lesions. Soft tissue planes are non suspicious. LEFT foot: No fracture deformity or dislocation. 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 10 mm geographic lucent lesion first metatarsal head. Severe vascular calcifications. Soft tissue planes are non suspicious. IMPRESSION: 1. No fracture deformity or dislocation. No radiographic findings of osteomyelitis. 2. Cystic lesion first metatarsal head may reflect gout or geode. 3. Severe vascular calcifications. Electronically Signed   By: Elon Alas M.D.   On: 07/20/2018 00:35    ekg nsr at 85, nl axis, no st-t changes c/w ischemia   Assessment & Plan:    Principal Problem:   PAD (peripheral artery disease) (HCC) Active Problems:   Cellulitis in diabetic foot ([place])   Diabetes mellitus, type II ([place])    Cellulitis Blood culture x2 vanco iv, cefepime iv pharmacy to dose  Ischemic 1st toe left foot, dry gangrene PAD (presumed) NPO Start heparin iv pharmacy to dose Please call vascular surgery when patient arrives. They have been consulted by ED,   Hypertension uncontrolled Hydralazine 10mg  iv q6h prn   Dm2 fsbs q4h, ISS   DVT Prophylaxis Heparin -  SCDs   AM Labs Ordered, also please review Full Orders  Family Communication: Admission, patients condition and plan of care including tests being  ordered have been discussed with the patient  who indicate understanding and agree with the plan and Code Status.  Code Status FULL CODE  Likely DC to  home  Condition GUARDED    Consults called: vascular by ED, please call them once patient arrives at [facility], and they will consult  Admission status: observation, pt will be hospitalized for iv abx, and for management of ischemic 1st toe left foot by vascular surgery, depending upon clinical progress and w/up by vascular surgery may require inpatient status  Time spent in minutes : 70   Jani Gravel M.D on 07/20/2018 at 4:24 AM  Between 7am to 7pm - Pager - [phone]  After 7pm go to [email or web address] - password [facility]  [facility] - Office  [phone]

## 2018-07-20 NOTE — Consult Note (Signed)
Hospital Consult    Reason for Consult:  PAD left leg Referring Physician:  [name] MRN #:  [id number]  History of Present Illness: This is a 67 y.o. male with history of hypertension, hyperlipidemia, diabetes that presents as a transfer from [facility] for evaluation of a left foot wound and concern for underlying PAD.  Patient reportedly was seen in the ED early this morning for evaluation of left foot pain.  Patient states he has had left foot pain for nearly 2 months with  numbness in the foot.  Prior to this he had claudication.  He states his left toe and the top of his foot became discolored over a month ago and he has not sought any medical care.  At the time of my evaluation he has some numbness in the foot but states he can still move the foot and the numbness has been present for several months.  He was placed on heparin drip prior to transfer given there was initial concern that he had no signals in his foot.  He does smoke 1 PPD.  Denies any prior lower extremity revascularizations.   Past Medical History:  Diagnosis Date  . Arthritis   . Diabetes mellitus without complication ([place])   . Emesis   . Heart murmur   . Hyperglycemia   . Hypertension   . Joint pain     Past Surgical History:  Procedure Laterality Date  . ELBOW SURGERY     Bone graft to repair elbow  . TONSILLECTOMY      Allergies  Allergen Reactions  . Tea Itching and Rash    Prior to Admission medications   Not on File    Social History   Socioeconomic History  . Marital status: Married    Spouse name: Not on file  . Number of children: Not on file  . Years of education: Not on file  . Highest education level: Not on file  Occupational History  . Not on file  Social Needs  . Financial resource strain: Not on file  . Food insecurity:    Worry: Not on file    Inability: Not on file  . Transportation needs:    Medical: Not on file    Non-medical: Not on file  Tobacco Use  . Smoking  status: Current Every Day Smoker    Packs/day: 0.50    Types: Cigarettes  . Smokeless tobacco: Former Systems developer    Types: Snuff    Quit date: 04/21/1989  Substance and Sexual Activity  . Alcohol use: Yes    Alcohol/week: 12.0 standard drinks    Types: 12 Cans of beer per week  . Drug use: No  . Sexual activity: Not Currently  Lifestyle  . Physical activity:    Days per week: Not on file    Minutes per session: Not on file  . Stress: Not on file  Relationships  . Social connections:    Talks on phone: Not on file    Gets together: Not on file    Attends religious service: Not on file    Active member of club or organization: Not on file    Attends meetings of clubs or organizations: Not on file    Relationship status: Not on file  . Intimate partner violence:    Fear of current or ex partner: Not on file    Emotionally abused: Not on file    Physically abused: Not on file    Forced sexual activity:  Not on file  Other Topics Concern  . Not on file  Social History Narrative  . Not on file     Family History  Problem Relation Age of Onset  . Cancer Sister     ROS: [x]  Positive   [ ]  Negative   [ ]  All sytems reviewed and are negative  Cardiovascular: []  chest pain/pressure []  palpitations []  SOB lying flat []  DOE []  pain in legs while walking [x]  pain in legs at rest []  pain in legs at night [x]  non-healing ulcers []  hx of DVT []  swelling in legs  Pulmonary: []  productive cough []  asthma/wheezing []  home O2  Neurologic: []  weakness in []  arms []  legs []  numbness in []  arms []  legs []  hx of CVA []  mini stroke [] difficulty speaking or slurred speech []  temporary loss of vision in one eye []  dizziness  Hematologic: []  hx of cancer []  bleeding problems []  problems with blood clotting easily  Endocrine:   []  diabetes []  thyroid disease  GI []  vomiting blood []  blood in stool  GU: []  CKD/renal failure []  HD--[]  M/W/F or []  T/T/S []  burning with  urination []  blood in urine  Psychiatric: []  anxiety []  depression  Musculoskeletal: []  arthritis []  joint pain  Integumentary: []  rashes []  ulcers  Constitutional: []  fever []  chills   Physical Examination  Vitals:   07/20/18 1600 07/20/18 1718  BP: (!) 172/75 (!) 171/94  Pulse: 65 69  Resp: 16 18  Temp:    SpO2: 99% 100%   Body mass index is 19.99 kg/m.  General:  NAD, resting in bed Gait: Not observed HENT: WNL, normocephalic, poor dentition Pulmonary: normal non-labored breathing, without Rales, rhonchi,  wheezing Cardiac: regular, without  Murmurs, rubs or gallops Abdomen: soft, NT/ND, no masses      Vascular Exam/Pulses:  Right Left  Radial 2+ (normal) 2+ (normal)  Ulnar    Femoral 2+ (normal) 2+ (normal)  Popliteal    DP 2+ (normal) Monophasic signal  PT Triphasic signal Not audible   Extremities: with ischemic changes left great toe and top of foot with cellulitis Musculoskeletal: no muscle wasting or atrophy  Neurologic: A&O X 3; Appropriate Affect ; SENSATION: numbness to left foot; MOTOR FUNCTION:  moving all extremities equally. Speech is fluent/normal   CBC    Component Value Date/Time   WBC 8.7 07/20/2018 0039   RBC 5.15 07/20/2018 0039   HGB 15.8 07/20/2018 0039   HCT 46.8 07/20/2018 0039   PLT 198 07/20/2018 0039   MCV 90.9 07/20/2018 0039   MCH 30.7 07/20/2018 0039   MCHC 33.8 07/20/2018 0039   RDW 13.1 07/20/2018 0039   LYMPHSABS 2.5 07/20/2018 0039   MONOABS 0.7 07/20/2018 0039   EOSABS 0.2 07/20/2018 0039   BASOSABS 0.1 07/20/2018 0039    BMET    Component Value Date/Time   NA 138 07/20/2018 0039   K 3.0 (L) 07/20/2018 0039   CL 105 07/20/2018 0039   CO2 24 07/20/2018 0039   GLUCOSE 200 (H) 07/20/2018 0039   BUN 7 (L) 07/20/2018 0039   CREATININE 0.60 (L) 07/20/2018 0039   CALCIUM 8.7 (L) 07/20/2018 0039   GFRNONAA >60 07/20/2018 0039   GFRAA >60 07/20/2018 0039    COAGS: Lab Results  Component Value Date     INR 0.87 07/20/2018     Non-Invasive Vascular Imaging:    None  ASSESSMENT/PLAN: This is a 67 y.o. male with multiple medical comorbidities that presents with critical limb ischemia  of his left lower extremity with tissue loss.  On my evaluation patient has a monophasic dorsalis pedis signal in the left foot and his foot has been chronically worsening over the last several months.  Would be fine if you want to continue heparin for now if he feels that offer some symptomatic improvement.  We will plan for left lower extremity arteriogram this week and he will need revascularization prior to any amputation if he is going to have any chance of wound healing.  Discussed with patient he is at least going to lose his left great toe if not his midfoot and possibly his leg.  Underlying tissue loss may be severe enough at this point that the foot is not salvageable.  Would recommend continuing IV antibiotics for now and we will see how his foot improves.  Marty Heck, MD [facility] Office: [phone] Pager: [place]

## 2018-07-20 NOTE — ED Notes (Signed)
Confirmed with Dr. Wynetta Emery that he did not want APTT drawn.

## 2018-07-20 NOTE — ED Notes (Signed)
Report given to [facility]. 

## 2018-07-20 NOTE — ED Notes (Signed)
Pt given ice chips at this time per RN approval

## 2018-07-21 ENCOUNTER — Encounter (HOSPITAL_COMMUNITY): Payer: Self-pay

## 2018-07-21 ENCOUNTER — Observation Stay (HOSPITAL_COMMUNITY): Payer: Medicare Other

## 2018-07-21 ENCOUNTER — Other Ambulatory Visit: Payer: Self-pay

## 2018-07-21 DIAGNOSIS — I7092 Chronic total occlusion of artery of the extremities: Secondary | ICD-10-CM | POA: Diagnosis present

## 2018-07-21 DIAGNOSIS — J9601 Acute respiratory failure with hypoxia: Secondary | ICD-10-CM | POA: Diagnosis not present

## 2018-07-21 DIAGNOSIS — Z4781 Encounter for orthopedic aftercare following surgical amputation: Secondary | ICD-10-CM | POA: Diagnosis present

## 2018-07-21 DIAGNOSIS — E785 Hyperlipidemia, unspecified: Secondary | ICD-10-CM | POA: Diagnosis present

## 2018-07-21 DIAGNOSIS — Z91018 Allergy to other foods: Secondary | ICD-10-CM | POA: Diagnosis not present

## 2018-07-21 DIAGNOSIS — E1152 Type 2 diabetes mellitus with diabetic peripheral angiopathy with gangrene: Secondary | ICD-10-CM | POA: Diagnosis present

## 2018-07-21 DIAGNOSIS — G547 Phantom limb syndrome without pain: Secondary | ICD-10-CM | POA: Diagnosis present

## 2018-07-21 DIAGNOSIS — Z6821 Body mass index (BMI) 21.0-21.9, adult: Secondary | ICD-10-CM | POA: Diagnosis not present

## 2018-07-21 DIAGNOSIS — D62 Acute posthemorrhagic anemia: Secondary | ICD-10-CM | POA: Diagnosis present

## 2018-07-21 DIAGNOSIS — Z23 Encounter for immunization: Secondary | ICD-10-CM | POA: Diagnosis present

## 2018-07-21 DIAGNOSIS — I70208 Unspecified atherosclerosis of native arteries of extremities, other extremity: Secondary | ICD-10-CM | POA: Diagnosis present

## 2018-07-21 DIAGNOSIS — I998 Other disorder of circulatory system: Secondary | ICD-10-CM | POA: Diagnosis not present

## 2018-07-21 DIAGNOSIS — I70202 Unspecified atherosclerosis of native arteries of extremities, left leg: Secondary | ICD-10-CM | POA: Diagnosis present

## 2018-07-21 DIAGNOSIS — K5900 Constipation, unspecified: Secondary | ICD-10-CM | POA: Diagnosis not present

## 2018-07-21 DIAGNOSIS — Z89512 Acquired absence of left leg below knee: Secondary | ICD-10-CM | POA: Diagnosis not present

## 2018-07-21 DIAGNOSIS — F17210 Nicotine dependence, cigarettes, uncomplicated: Secondary | ICD-10-CM | POA: Diagnosis present

## 2018-07-21 DIAGNOSIS — E871 Hypo-osmolality and hyponatremia: Secondary | ICD-10-CM | POA: Diagnosis not present

## 2018-07-21 DIAGNOSIS — L03116 Cellulitis of left lower limb: Secondary | ICD-10-CM | POA: Diagnosis present

## 2018-07-21 DIAGNOSIS — E1151 Type 2 diabetes mellitus with diabetic peripheral angiopathy without gangrene: Secondary | ICD-10-CM | POA: Diagnosis present

## 2018-07-21 DIAGNOSIS — E1142 Type 2 diabetes mellitus with diabetic polyneuropathy: Secondary | ICD-10-CM | POA: Diagnosis present

## 2018-07-21 DIAGNOSIS — I10 Essential (primary) hypertension: Secondary | ICD-10-CM | POA: Diagnosis present

## 2018-07-21 DIAGNOSIS — R011 Cardiac murmur, unspecified: Secondary | ICD-10-CM | POA: Diagnosis not present

## 2018-07-21 DIAGNOSIS — E8770 Fluid overload, unspecified: Secondary | ICD-10-CM | POA: Diagnosis not present

## 2018-07-21 DIAGNOSIS — E43 Unspecified severe protein-calorie malnutrition: Secondary | ICD-10-CM | POA: Diagnosis present

## 2018-07-21 DIAGNOSIS — Z599 Problem related to housing and economic circumstances, unspecified: Secondary | ICD-10-CM | POA: Diagnosis not present

## 2018-07-21 DIAGNOSIS — I739 Peripheral vascular disease, unspecified: Secondary | ICD-10-CM

## 2018-07-21 DIAGNOSIS — E876 Hypokalemia: Secondary | ICD-10-CM | POA: Diagnosis present

## 2018-07-21 DIAGNOSIS — M79605 Pain in left leg: Secondary | ICD-10-CM | POA: Diagnosis present

## 2018-07-21 LAB — BASIC METABOLIC PANEL
ANION GAP: 7 (ref 5–15)
BUN: 10 mg/dL (ref 8–23)
CO2: 23 mmol/L (ref 22–32)
Calcium: 8.5 mg/dL — ABNORMAL LOW (ref 8.9–10.3)
Chloride: 110 mmol/L (ref 98–111)
Creatinine, Ser: 0.74 mg/dL (ref 0.61–1.24)
GFR calc Af Amer: >60 mL/min (ref >60)
GLUCOSE: 151 mg/dL — AB (ref 70–99)
POTASSIUM: 3.6 mmol/L (ref 3.5–5.1)
Sodium: 140 mmol/L (ref 135–145)

## 2018-07-21 LAB — GLUCOSE, CAPILLARY
GLUCOSE-CAPILLARY: 210 mg/dL — AB (ref 70–99)
Glucose-Capillary: 172 mg/dL — ABNORMAL HIGH (ref 70–99)
Glucose-Capillary: 158 mg/dL — ABNORMAL HIGH (ref 70–99)
Glucose-Capillary: 136 mg/dL — ABNORMAL HIGH (ref 70–99)
Glucose-Capillary: 172 mg/dL — ABNORMAL HIGH (ref 70–99)

## 2018-07-21 LAB — CBC
HCT: 42.2 % (ref 39.0–52.0)
HEMOGLOBIN: 14.1 g/dL (ref 13.0–17.0)
MCH: 30.5 pg (ref 26.0–34.0)
MCHC: 33.4 g/dL (ref 30.0–36.0)
MCV: 91.1 fL (ref 80.0–100.0)
Platelets: 186 10*3/uL (ref 150–400)
RBC: 4.63 MIL/uL (ref 4.22–5.81)
RDW: 13.2 % (ref 11.5–15.5)
WBC: 7.3 10*3/uL (ref 4.0–10.5)
nRBC: 0 % (ref 0.0–0.2)

## 2018-07-21 LAB — HIV ANTIBODY (ROUTINE TESTING W REFLEX): HIV Screen 4th Generation wRfx: NONREACTIVE

## 2018-07-21 LAB — HEMOGLOBIN A1C
Hgb A1c MFr Bld: 7.4 % — ABNORMAL HIGH (ref 4.8–5.6)
Mean Plasma Glucose: 165.68 mg/dL

## 2018-07-21 LAB — HEPARIN LEVEL (UNFRACTIONATED)
HEPARIN UNFRACTIONATED: 0.15 [IU]/mL — AB (ref 0.30–0.70)
Heparin Unfractionated: 0.6 IU/mL (ref 0.30–0.70)

## 2018-07-21 MED ORDER — INSULIN ASPART 100 UNIT/ML ~~LOC~~ SOLN
0.0000 [IU] | Freq: Three times a day (TID) | SUBCUTANEOUS | Status: DC
  Administered 2018-07-21 – 2018-07-24 (×8): 1 [IU] via SUBCUTANEOUS
  Administered 2018-07-25: 2 [IU] via SUBCUTANEOUS
  Administered 2018-07-26: 1 [IU] via SUBCUTANEOUS
  Administered 2018-07-26 – 2018-07-27 (×3): 2 [IU] via SUBCUTANEOUS
  Administered 2018-07-28: 1 [IU] via SUBCUTANEOUS
  Administered 2018-07-28 – 2018-07-29 (×2): 2 [IU] via SUBCUTANEOUS
  Administered 2018-07-29: 1 [IU] via SUBCUTANEOUS
  Administered 2018-07-29: 2 [IU] via SUBCUTANEOUS
  Administered 2018-07-30: 1 [IU] via SUBCUTANEOUS
  Administered 2018-07-31 – 2018-08-02 (×6): 2 [IU] via SUBCUTANEOUS
  Administered 2018-08-03: 3 [IU] via SUBCUTANEOUS

## 2018-07-21 MED ORDER — HEPARIN BOLUS VIA INFUSION
2000.0000 [IU] | Freq: Once | INTRAVENOUS | Status: AC
  Administered 2018-07-21: 2000 [IU] via INTRAVENOUS
  Filled 2018-07-21: qty 2000

## 2018-07-21 MED ORDER — INFLUENZA VAC SPLIT HIGH-DOSE 0.5 ML IM SUSY
0.5000 mL | PREFILLED_SYRINGE | INTRAMUSCULAR | Status: AC
  Administered 2018-07-26: 0.5 mL via INTRAMUSCULAR
  Filled 2018-07-21 (×2): qty 0.5

## 2018-07-21 MED ORDER — PNEUMOCOCCAL VAC POLYVALENT 25 MCG/0.5ML IJ INJ
0.5000 mL | INJECTION | INTRAMUSCULAR | Status: AC
  Administered 2018-07-26: 0.5 mL via INTRAMUSCULAR
  Filled 2018-07-21: qty 0.5

## 2018-07-21 NOTE — Progress Notes (Signed)
[facility] for heparin Indication: critical limb ischemia  Allergies  Allergen Reactions  . Tea Itching and Rash    Patient Measurements: Height: 5\' 11"  (180.3 cm) Weight: 152 lb 1.9 oz (69 kg) IBW/kg (Calculated) : 75.3 HEPARIN DW (KG): 65   Vital Signs: Temp: 98.6 F (37 C) (10/26 2354) Temp Source: Oral (10/26 2354) BP: 139/52 (10/27 0425) Pulse Rate: 65 (10/27 0425)  Labs: Recent Labs    07/20/18 0039 07/20/18 1227 07/20/18 1832 07/21/18 0715 07/21/18 0803  HGB 15.8  --   --  14.1  --   HCT 46.8  --   --  42.2  --   PLT 198  --   --  186  --   APTT 35  --   --   --   --   LABPROT 11.8  --   --   --   --   INR 0.87  --   --   --   --   HEPARINUNFRC  --  0.43  --   --  0.15*  CREATININE 0.60*  --  0.63 0.74  --    Estimated Creatinine Clearance: 88.6 mL/min (by C-G formula based on SCr of 0.74 mg/dL).  Medical History: Past Medical History:  Diagnosis Date  . Arthritis   . Diabetes mellitus without complication ([place])   . Emesis   . Heart murmur   . Hyperglycemia   . Hypertension   . Joint pain     Medications:  Scheduled:  . heparin  2,000 Units Intravenous Once  . [START ON 07/22/2018] Influenza vac split quadrivalent PF  0.5 mL Intramuscular Tomorrow-1000  . insulin aspart  0-9 Units Subcutaneous Q4H  . [START ON 07/22/2018] pneumococcal 23 valent vaccine  0.5 mL Intramuscular Tomorrow-1000   Infusions:  . sodium chloride 100 mL/hr at 07/21/18 0738  . ceFEPime (MAXIPIME) IV 2 g (07/21/18 0405)  . heparin 1,200 Units/hr (07/21/18 0140)  . vancomycin 1,000 mg (07/21/18 0449)   PRN:   Assessment: 67 yo male presented with left foot pain thought to be from trauma of dropping furnace on it a month ago. He continues anticoagulation with heparin for critical limb ischemia.  Note plans for LLE arteriogram on Wednesday. His heparin level is subtherapeutic.  No reports of heparin interruption per RN. His CBC  remains stable with no bleeding noted.  Goal of Therapy:  Heparin level 0.3-0.7 units/ml  Monitor platelets by anticoagulation protocol: Yes   Plan:  Give heparin 2000 units IV bolus Increase heparin infusion to 1400 units/hr Check heparin level in 6hr Daily Heparin level and CBC  Legrand Como, Pharm.D., BCPS, BCIDP Clinical Pharmacist Phone: [phone] Please check AMION for all [place] numbers 07/21/2018, 9:58 AM

## 2018-07-21 NOTE — Progress Notes (Signed)
PROGRESS NOTE    John Bertsch Sr.  VVO:[id number] DOB: 1951-02-16 DOA: 07/19/2018 PCP: Patient, No Pcp Per   Brief Narrative: [name]  is a 67 y.o. male, w dm2, who c/o left foot pain x 3 months with tip of 1st digit left foot black and discolored.  Pt apparently presented to ED due to redness on the dorsum of the left foot. Pt notes trauma about 11months ago when he dropped something on his foot.  Pt denies fever, chills, cp, palp, sob, n/v, diarrhea, brbpr.  Pt can't afford diabetic medication but can apparently afford cigarettes.    Xray L foot IMPRESSION: 1. No fracture deformity or dislocation. No radiographic findings of osteomyelitis. 2. Cystic lesion first metatarsal head may reflect gout or geode. 3. Severe vascular calcifications.  Pt will be admitted for cellulitis and dry gangrene tip of 1st toe left foot due to presumed PAD   Assessment & Plan:   Principal Problem:   PAD (peripheral artery disease) (HCC) Active Problems:   Cellulitis in diabetic foot ([place])   Diabetes mellitus, type II ([place])   Cellulitis   Dry gangrene ([place])  1-Left LE  Chronic ischemia, first toe superimpose cellulitis Continue with IV antibiotics.  On Heparin Gtt Appreciate vascular. Plan for arteriogram on Wednesday.  Follow blood culture;  2-HTN; PRN hydralazine.  If continue to be elevated will consider start Norvasc.   3-DM; SSI; check HBA1c.   4-Tobacco use; counseling provided.      DVT prophylaxis: heparin  Code Status:  Full code.  Family Communication: no family at bedside.  Disposition Plan: remain inpatient for IV antibiotics and heparin gtt  Consultants:   Vascular.    Procedures:   ABI   Antimicrobials:   Vancomycin  cefepime   Subjective: Denies worsening pain . Redness mildly improved.   Objective: Vitals:   07/20/18 1913 07/20/18 2354 07/21/18 0425 07/21/18 0459  BP: (!) 193/92 (!) 151/78 (!) 139/52   Pulse: 70 76 65   Resp: 18       Temp: 98.3 F (36.8 C) 98.6 F (37 C)    TempSrc: Oral Oral    SpO2: 98% 98% 98%   Weight:    69 kg  Height:        Intake/Output Summary (Last 24 hours) at 07/21/2018 0742 Last data filed at 07/21/2018 0500 Gross per 24 hour  Intake 300 ml  Output 600 ml  Net -300 ml   Filed Weights   07/20/18 0030 07/20/18 1710 07/21/18 0459  Weight: 74.8 kg 65 kg 69 kg    Examination:  General exam: Appears calm and comfortable  Respiratory system: Clear to auscultation. Respiratory effort normal. Cardiovascular system: S1 & S2 heard, RRR. No JVD, murmurs, rubs, gallops or clicks. No pedal edema. Gastrointestinal system: Abdomen is nondistended, soft and nontender. No organomegaly or masses felt. Normal bowel sounds heard. Central nervous system: Alert and oriented. No focal neurological deficits. Extremities: Symmetric 5 x 5 power. Skin: let first toe with discoloration, necrosis, anterior aspect of left foot with improved redness Psychiatry: Judgement and insight appear normal. Mood & affect appropriate.     Data Reviewed: I have personally reviewed following labs and imaging studies  CBC: Recent Labs  Lab 07/20/18 0039 07/21/18 0715  WBC 8.7 7.3  NEUTROABS 5.3  --   HGB 15.8 14.1  HCT 46.8 42.2  MCV 90.9 91.1  PLT 198 269   Basic Metabolic Panel: Recent Labs  Lab 07/20/18 0039 07/20/18 1832  NA  138 140  K 3.0* 3.6  CL 105 109  CO2 24 22  GLUCOSE 200* 100*  BUN 7* 5*  CREATININE 0.60* 0.63  CALCIUM 8.7* 8.6*   GFR: Estimated Creatinine Clearance: 88.6 mL/min (by C-G formula based on SCr of 0.63 mg/dL). Liver Function Tests: Recent Labs  Lab 07/20/18 0039  AST 39  ALT 38  ALKPHOS 85  BILITOT 0.6  PROT 7.3  ALBUMIN 3.6   No results for input(s): LIPASE, AMYLASE in the last 168 hours. No results for input(s): AMMONIA in the last 168 hours. Coagulation Profile: Recent Labs  Lab 07/20/18 0039  INR 0.87   Cardiac Enzymes: No results for input(s):  CKTOTAL, CKMB, CKMBINDEX, TROPONINI in the last 168 hours. BNP (last 3 results) No results for input(s): PROBNP in the last 8760 hours. HbA1C: Recent Labs    07/20/18 0039  HGBA1C 7.3*   CBG: Recent Labs  Lab 07/20/18 1606 07/20/18 1719 07/20/18 2113 07/20/18 2357 07/21/18 0430  GLUCAP 109* 88 154* 152* 172*   Lipid Profile: No results for input(s): CHOL, HDL, LDLCALC, TRIG, CHOLHDL, LDLDIRECT in the last 72 hours. Thyroid Function Tests: No results for input(s): TSH, T4TOTAL, FREET4, T3FREE, THYROIDAB in the last 72 hours. Anemia Panel: No results for input(s): VITAMINB12, FOLATE, FERRITIN, TIBC, IRON, RETICCTPCT in the last 72 hours. Sepsis Labs: Recent Labs  Lab 07/20/18 0051 07/20/18 0213 07/20/18 1832 07/20/18 2121  LATICACIDVEN 2.09* 2.44* 1.2 1.0    Recent Results (from the past 240 hour(s))  Blood Culture (routine x 2)     Status: None (Preliminary result)   Collection Time: 07/20/18 12:39 AM  Result Value Ref Range Status   Specimen Description RIGHT ANTECUBITAL  Final   Special Requests   Final    BOTTLES DRAWN AEROBIC AND ANAEROBIC Blood Culture adequate volume   Culture   Final    NO GROWTH 1 DAY Performed at [facility], [address] [place], [place] [address]    Report Status PENDING  Incomplete  Blood Culture (routine x 2)     Status: None (Preliminary result)   Collection Time: 07/20/18 12:42 AM  Result Value Ref Range Status   Specimen Description RIGHT ANTECUBITAL  Final   Special Requests   Final    BOTTLES DRAWN AEROBIC AND ANAEROBIC Blood Culture adequate volume   Culture   Final    NO GROWTH 1 DAY Performed at [facility], [address], [place], [place] [address]    Report Status PENDING  Incomplete         Radiology Studies: Dg Tibia/fibula Left  Result Date: 07/20/2018 CLINICAL DATA:  LEFT foot discoloration.  History of diabetes. EXAM: LEFT FOOT - COMPLETE 3+ VIEW; LEFT TIBIA AND FIBULA - 2 VIEW COMPARISON:  None.  FINDINGS: LEFT tibia and fibula: No destructive bony lesions. Mild degenerative changes the included knee with faint periarticular calcifications. Moderate vascular calcifications. No destructive bony lesions. Soft tissue planes are non suspicious. LEFT foot: No fracture deformity or dislocation. 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 10 mm geographic lucent lesion first metatarsal head. Severe vascular calcifications. Soft tissue planes are non suspicious. IMPRESSION: 1. No fracture deformity or dislocation. No radiographic findings of osteomyelitis. 2. Cystic lesion first metatarsal head may reflect gout or geode. 3. Severe vascular calcifications. Electronically Signed   By: Elon Alas M.D.   On: 07/20/2018 00:35   Dg Foot Complete Left  Result Date: 07/20/2018 CLINICAL DATA:  LEFT foot discoloration.  History of diabetes. EXAM: LEFT FOOT - COMPLETE 3+ VIEW; LEFT TIBIA AND FIBULA - 2 VIEW COMPARISON:  None. FINDINGS: LEFT tibia and fibula: No destructive bony lesions. Mild degenerative changes the included knee with faint periarticular calcifications. Moderate vascular calcifications. No destructive bony lesions. Soft tissue planes are non suspicious. LEFT foot: No fracture deformity or dislocation. 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 10 mm geographic lucent lesion first metatarsal head. Severe vascular calcifications. Soft tissue planes are non suspicious. IMPRESSION: 1. No fracture deformity or dislocation. No radiographic findings of osteomyelitis. 2. Cystic lesion first metatarsal head may reflect gout or geode. 3. Severe vascular calcifications. Electronically Signed   By: Elon Alas M.D.   On: 07/20/2018 00:35        Scheduled Meds: . [START ON 07/22/2018] Influenza vac split quadrivalent PF  0.5 mL Intramuscular  Tomorrow-1000  . insulin aspart  0-9 Units Subcutaneous Q4H  . [START ON 07/22/2018] pneumococcal 23 valent vaccine  0.5 mL Intramuscular Tomorrow-1000   Continuous Infusions: . sodium chloride 100 mL/hr at 07/21/18 0738  . ceFEPime (MAXIPIME) IV 2 g (07/21/18 0405)  . heparin 1,200 Units/hr (07/21/18 0140)  . vancomycin 1,000 mg (07/21/18 0449)     LOS: 0 days    Time spent: 35 minutes.     Elmarie Shiley, MD [facility] Pager [phone]  If 7PM-7AM, please contact night-coverage [email or web address] Password [facility] 07/21/2018, 7:42 AM

## 2018-07-21 NOTE — Progress Notes (Signed)
VASCULAR LAB PRELIMINARY  ARTERIAL  ABI completed:    RIGHT    LEFT    PRESSURE WAVEFORM  PRESSURE WAVEFORM  BRACHIAL 188 Triphasic BRACHIAL 160 Triphasic  DP 177 Biphasic DP 57 Severely Dampened monophasic  PT 176 Triphasic PT 42 Severely Dampened monophasic     CALF 51   GREAT TOE 63 NA GREAT TOE 0 NA    RIGHT LEFT  ABI 0.94/0.34 0.30/0   Right ABI indicate normal arterial flow at rest. TBI is abnormal. Left - Technically difficult due to venous signal interference. ABI indicates a severe reduction in arterial flow at rest . Calf pressure is severely reduced. CBI is 0.28. TBI could not be obtained due to absent waveform.  Tobie Perdue, RVS 07/21/2018, 3:00 PM

## 2018-07-21 NOTE — Progress Notes (Signed)
[facility] [place]  Subjective  - No acute events.  Left leg unchanged.   Objective (!) 139/52 65 98.6 F (37 C) (Oral) 18 98%  Intake/Output Summary (Last 24 hours) at 07/21/2018 0951 Last data filed at 07/21/2018 0500 Gross per 24 hour  Intake 300 ml  Output 600 ml  Net -300 ml    General: Palpable femoral pulses Palpable R DP Monophasic left DP  Laboratory Lab Results: Recent Labs    07/20/18 0039 07/21/18 0715  WBC 8.7 7.3  HGB 15.8 14.1  HCT 46.8 42.2  PLT 198 186   BMET Recent Labs    07/20/18 1832 07/21/18 0715  NA 140 140  K 3.6 3.6  CL 109 110  CO2 22 23  GLUCOSE 100* 151*  BUN 5* 10  CREATININE 0.63 0.74  CALCIUM 8.6* 8.5*    COAG Lab Results  Component Value Date   INR 0.87 07/20/2018   No results found for: PTT  Assessment/Planning: 67 yo 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  Will get BLE ABI's today, has monophasic L DP on exam.  Plan for LLE arteriogram Wednesday.  Continue antibiotics.  Pending how his foot responds to antibiotics will likely need more than just simple toe amputation and discussed at risk for leg amputation.  Marty Heck 07/21/2018 9:51 AM --

## 2018-07-21 NOTE — Progress Notes (Signed)
[place] for heparin Indication: critical limb ischemia  Allergies  Allergen Reactions  . Tea Itching and Rash    Patient Measurements: Height: 5\' 11"  (180.3 cm) Weight: 152 lb 1.9 oz (69 kg) IBW/kg (Calculated) : 75.3 HEPARIN DW (KG): 65   Vital Signs: Temp: 97.7 F (36.5 C) (10/27 1500) Temp Source: Oral (10/27 1500) BP: 148/76 (10/27 1500) Pulse Rate: 68 (10/27 1500)  Labs: Recent Labs    07/20/18 0039 07/20/18 1227 07/20/18 1832 07/21/18 0715 07/21/18 0803 07/21/18 1615  HGB 15.8  --   --  14.1  --   --   HCT 46.8  --   --  42.2  --   --   PLT 198  --   --  186  --   --   APTT 35  --   --   --   --   --   LABPROT 11.8  --   --   --   --   --   INR 0.87  --   --   --   --   --   HEPARINUNFRC  --  0.43  --   --  0.15* 0.60  CREATININE 0.60*  --  0.63 0.74  --   --    Estimated Creatinine Clearance: 88.6 mL/min (by C-G formula based on SCr of 0.74 mg/dL).  Medical History: Past Medical History:  Diagnosis Date  . Arthritis   . Diabetes mellitus without complication ([place])   . Emesis   . Heart murmur   . Hyperglycemia   . Hypertension   . Joint pain     Medications:  Scheduled:  . [START ON 07/22/2018] Influenza vac split quadrivalent PF  0.5 mL Intramuscular Tomorrow-1000  . insulin aspart  0-9 Units Subcutaneous TID WC  . [START ON 07/22/2018] pneumococcal 23 valent vaccine  0.5 mL Intramuscular Tomorrow-1000   Infusions:  . sodium chloride 100 mL/hr at 07/21/18 0738  . ceFEPime (MAXIPIME) IV 2 g (07/21/18 0405)  . heparin 1,400 Units/hr (07/21/18 1003)  . vancomycin 1,000 mg (07/21/18 0449)   PRN:   Assessment: 67 yo male presented with left foot pain thought to be from trauma of dropping furnace on it a month ago. He continues anticoagulation with heparin for critical limb ischemia. Note plans for LLE arteriogram on Wednesday.  Heparin level therapeutic. CBC remains stable with no bleeding  documented.  Goal of Therapy:  Heparin level 0.3-0.7 units/ml  Monitor platelets by anticoagulation protocol: Yes   Plan:  Continue heparin infusion to 1400 units/hr Confirmatory heparin level with AM labs Monitor daily heparin level and CBC, s/sx bleeding LEE arteriogram planned for Melody [place], PharmD, BCPS Please check AMION for all [place] contact numbers Clinical Pharmacist 07/21/2018 5:06 PM

## 2018-07-22 LAB — BASIC METABOLIC PANEL
Anion gap: 6 (ref 5–15)
BUN: 6 mg/dL — ABNORMAL LOW (ref 8–23)
CALCIUM: 8.7 mg/dL — AB (ref 8.9–10.3)
CO2: 26 mmol/L (ref 22–32)
Chloride: 108 mmol/L (ref 98–111)
Creatinine, Ser: 0.55 mg/dL — ABNORMAL LOW (ref 0.61–1.24)
GLUCOSE: 151 mg/dL — AB (ref 70–99)
Potassium: 3.3 mmol/L — ABNORMAL LOW (ref 3.5–5.1)
Sodium: 140 mmol/L (ref 135–145)

## 2018-07-22 LAB — GLUCOSE, CAPILLARY
GLUCOSE-CAPILLARY: 150 mg/dL — AB (ref 70–99)
Glucose-Capillary: 117 mg/dL — ABNORMAL HIGH (ref 70–99)
Glucose-Capillary: 129 mg/dL — ABNORMAL HIGH (ref 70–99)
Glucose-Capillary: 143 mg/dL — ABNORMAL HIGH (ref 70–99)
Glucose-Capillary: 277 mg/dL — ABNORMAL HIGH (ref 70–99)

## 2018-07-22 LAB — CBC
HCT: 43.8 % (ref 39.0–52.0)
Hemoglobin: 14.7 g/dL (ref 13.0–17.0)
MCH: 30.1 pg (ref 26.0–34.0)
MCHC: 33.6 g/dL (ref 30.0–36.0)
MCV: 89.6 fL (ref 80.0–100.0)
NRBC: 0 % (ref 0.0–0.2)
PLATELETS: 180 10*3/uL (ref 150–400)
RBC: 4.89 MIL/uL (ref 4.22–5.81)
RDW: 13.2 % (ref 11.5–15.5)
WBC: 7 10*3/uL (ref 4.0–10.5)

## 2018-07-22 LAB — HEPARIN LEVEL (UNFRACTIONATED): HEPARIN UNFRACTIONATED: 0.55 [IU]/mL (ref 0.30–0.70)

## 2018-07-22 MED ORDER — AMLODIPINE BESYLATE 5 MG PO TABS
5.0000 mg | ORAL_TABLET | Freq: Every day | ORAL | Status: DC
  Administered 2018-07-22 – 2018-07-25 (×4): 5 mg via ORAL
  Filled 2018-07-22 (×4): qty 1

## 2018-07-22 MED ORDER — NICOTINE 14 MG/24HR TD PT24
14.0000 mg | MEDICATED_PATCH | Freq: Every day | TRANSDERMAL | Status: DC
  Administered 2018-07-22 – 2018-08-05 (×15): 14 mg via TRANSDERMAL
  Filled 2018-07-22 (×15): qty 1

## 2018-07-22 MED ORDER — POTASSIUM CHLORIDE CRYS ER 20 MEQ PO TBCR
40.0000 meq | EXTENDED_RELEASE_TABLET | Freq: Two times a day (BID) | ORAL | Status: AC
  Administered 2018-07-22 (×2): 40 meq via ORAL
  Filled 2018-07-22 (×2): qty 2

## 2018-07-22 NOTE — Progress Notes (Signed)
[place] for heparin Indication: critical limb ischemia  Allergies  Allergen Reactions  . Tea Itching and Rash    Patient Measurements: Height: 5\' 11"  (180.3 cm) Weight: 150 lb 12.7 oz (68.4 kg) IBW/kg (Calculated) : 75.3 HEPARIN DW (KG): 65   Vital Signs: Temp: 98.5 F (36.9 C) (10/28 0741) Temp Source: Oral (10/28 0741) BP: 177/86 (10/28 0741) Pulse Rate: 64 (10/28 0741)  Labs: Recent Labs    07/20/18 0039  07/20/18 1832 07/21/18 0715 07/21/18 0803 07/21/18 1615 07/22/18 0430  HGB 15.8  --   --  14.1  --   --  14.7  HCT 46.8  --   --  42.2  --   --  43.8  PLT 198  --   --  186  --   --  180  APTT 35  --   --   --   --   --   --   LABPROT 11.8  --   --   --   --   --   --   INR 0.87  --   --   --   --   --   --   HEPARINUNFRC  --    < >  --   --  0.15* 0.60 0.55  CREATININE 0.60*  --  0.63 0.74  --   --  0.55*   < > = values in this interval not displayed.   Estimated Creatinine Clearance: 87.9 mL/min (A) (by C-G formula based on SCr of 0.55 mg/dL (L)).  Medical History: Past Medical History:  Diagnosis Date  . Arthritis   . Diabetes mellitus without complication ([place])   . Emesis   . Heart murmur   . Hyperglycemia   . Hypertension   . Joint pain     Medications:  Scheduled:  . Influenza vac split quadrivalent PF  0.5 mL Intramuscular Tomorrow-1000  . insulin aspart  0-9 Units Subcutaneous TID WC  . pneumococcal 23 valent vaccine  0.5 mL Intramuscular Tomorrow-1000  . potassium chloride  40 mEq Oral BID WC   Infusions:  . sodium chloride 100 mL/hr at 07/22/18 0621  . ceFEPime (MAXIPIME) IV 2 g (07/22/18 0559)  . heparin 1,400 Units/hr (07/21/18 1813)  . vancomycin 1,000 mg (07/22/18 0452)   PRN:   Assessment: 67 yo male presented with left foot pain thought to be from trauma of dropping furnace on it a month ago. LLL critical limb ischemia, on heparin pending arteriogram. Last Heparin level therapeutic at  0.55. Hgb, PLTC wnl, stable, no bleeding noted  Goal of Therapy:  Heparin level 0.3-0.7 units/ml  Monitor platelets by anticoagulation protocol: Yes   Plan:  Continue heparin infusion to 1,400 units/hr Monitor daily heparin level and CBC, s/sx bleeding LEE arteriogram planned for [place], PharmD, [facility] Clinical Pharmacist Phone number [phone] 07/22/2018 10:24 AM

## 2018-07-22 NOTE — Progress Notes (Signed)
NP notified that patient wants a nicotine patch.

## 2018-07-22 NOTE — Plan of Care (Signed)

## 2018-07-22 NOTE — Progress Notes (Signed)
PROGRESS NOTE    [name].  NAT:[id number] DOB: May 22, 1951 DOA: 07/19/2018 PCP: Patient, No Pcp Per   Brief Narrative: [name]  is a 67 y.o. male, w dm2, who c/o left foot pain x 3 months with tip of 1st digit left foot black and discolored.  Pt apparently presented to ED due to redness on the dorsum of the left foot. Pt notes trauma about 50months ago when he dropped something on his foot.  Pt denies fever, chills, cp, palp, sob, n/v, diarrhea, brbpr.  Pt can't afford diabetic medication but can apparently afford cigarettes.    Xray L foot IMPRESSION: 1. No fracture deformity or dislocation. No radiographic findings of osteomyelitis. 2. Cystic lesion first metatarsal head may reflect gout or geode. 3. Severe vascular calcifications.  Pt will be admitted for cellulitis and dry gangrene tip of 1st toe left foot due to presumed PAD   Assessment & Plan:   Principal Problem:   PAD (peripheral artery disease) (HCC) Active Problems:   Cellulitis in diabetic foot ([place])   Diabetes mellitus, type II ([place])   Cellulitis   Dry gangrene ([place])  1-Left LE  Chronic ischemia, first toe superimpose cellulitis Continue with IV antibiotics.  On Heparin Gtt Appreciate vascular. Plan for arteriogram on Wednesday.  Follow blood culture; no growth to date.  Per vascular calf pressure appears inadequate for even BKA>   2-HTN; PRN hydralazine.  Will start norvasc.    3-DM; SSI; check HBA1c. 7.4 Will need medications at discharge.  4-Tobacco use; counseling provided.   Hypokalemia; replete with oral KCL.   DVT prophylaxis: heparin  Code Status:  Full code.  Family Communication: no family at bedside.  Disposition Plan: remain inpatient for IV antibiotics and heparin gtt  Consultants:   Vascular.    Procedures:   ABI   Antimicrobials:   Vancomycin  cefepime   Subjective: No new complaints.    Objective: Vitals:   07/21/18 2012 07/22/18 0447 07/22/18  0600 07/22/18 0741  BP: (!) 185/68 (!) 175/72  (!) 177/86  Pulse: 73 (!) 54  64  Resp:  16    Temp: 98 F (36.7 C) 98 F (36.7 C)  98.5 F (36.9 C)  TempSrc: Oral Oral  Oral  SpO2: 97% 95%  99%  Weight:   68.4 kg   Height:        Intake/Output Summary (Last 24 hours) at 07/22/2018 1429 Last data filed at 07/22/2018 4270 Gross per 24 hour  Intake 360 ml  Output -  Net 360 ml   Filed Weights   07/20/18 1710 07/21/18 0459 07/22/18 0600  Weight: 65 kg 69 kg 68.4 kg    Examination:  General exam: NAD Respiratory system: CTA Cardiovascular system: S 1, S 2 RRR Gastrointestinal system: BS present, NT Central nervous system: alert, non focal.  Extremities: symmetric power.  Skin:first toe with discoloration, necrosis, foot with redness,      Data Reviewed: I have personally reviewed following labs and imaging studies  CBC: Recent Labs  Lab 07/20/18 0039 07/21/18 0715 07/22/18 0430  WBC 8.7 7.3 7.0  NEUTROABS 5.3  --   --   HGB 15.8 14.1 14.7  HCT 46.8 42.2 43.8  MCV 90.9 91.1 89.6  PLT 198 186 623   Basic Metabolic Panel: Recent Labs  Lab 07/20/18 0039 07/20/18 1832 07/21/18 0715 07/22/18 0430  NA 138 140 140 140  K 3.0* 3.6 3.6 3.3*  CL 105 109 110 108  CO2 24 22  23 26  GLUCOSE 200* 100* 151* 151*  BUN 7* 5* 10 6*  CREATININE 0.60* 0.63 0.74 0.55*  CALCIUM 8.7* 8.6* 8.5* 8.7*   GFR: Estimated Creatinine Clearance: 87.9 mL/min (A) (by C-G formula based on SCr of 0.55 mg/dL (L)). Liver Function Tests: Recent Labs  Lab 07/20/18 0039  AST 39  ALT 38  ALKPHOS 85  BILITOT 0.6  PROT 7.3  ALBUMIN 3.6   No results for input(s): LIPASE, AMYLASE in the last 168 hours. No results for input(s): AMMONIA in the last 168 hours. Coagulation Profile: Recent Labs  Lab 07/20/18 0039  INR 0.87   Cardiac Enzymes: No results for input(s): CKTOTAL, CKMB, CKMBINDEX, TROPONINI in the last 168 hours. BNP (last 3 results) No results for input(s): PROBNP in  the last 8760 hours. HbA1C: Recent Labs    07/20/18 0039 07/21/18 0715  HGBA1C 7.3* 7.4*   CBG: Recent Labs  Lab 07/21/18 1619 07/21/18 2016 07/22/18 0045 07/22/18 0740 07/22/18 1140  GLUCAP 136* 172* 277* 150* 129*   Lipid Profile: No results for input(s): CHOL, HDL, LDLCALC, TRIG, CHOLHDL, LDLDIRECT in the last 72 hours. Thyroid Function Tests: No results for input(s): TSH, T4TOTAL, FREET4, T3FREE, THYROIDAB in the last 72 hours. Anemia Panel: No results for input(s): VITAMINB12, FOLATE, FERRITIN, TIBC, IRON, RETICCTPCT in the last 72 hours. Sepsis Labs: Recent Labs  Lab 07/20/18 0051 07/20/18 0213 07/20/18 1832 07/20/18 2121  LATICACIDVEN 2.09* 2.44* 1.2 1.0    Recent Results (from the past 240 hour(s))  Blood Culture (routine x 2)     Status: None (Preliminary result)   Collection Time: 07/20/18 12:39 AM  Result Value Ref Range Status   Specimen Description RIGHT ANTECUBITAL  Final   Special Requests   Final    BOTTLES DRAWN AEROBIC AND ANAEROBIC Blood Culture adequate volume   Culture   Final    NO GROWTH 2 DAYS Performed at [facility] [address] [place], [place] [address]    Report Status PENDING  Incomplete  Blood Culture (routine x 2)     Status: None (Preliminary result)   Collection Time: 07/20/18 12:42 AM  Result Value Ref Range Status   Specimen Description RIGHT ANTECUBITAL  Final   Special Requests   Final    BOTTLES DRAWN AEROBIC AND ANAEROBIC Blood Culture adequate volume   Culture   Final    NO GROWTH 2 DAYS Performed at [facility], [address] [place], [place] [address]    Report Status PENDING  Incomplete         Radiology Studies: No results found.      Scheduled Meds: . amLODipine  5 mg Oral Daily  . Influenza vac split quadrivalent PF  0.5 mL Intramuscular Tomorrow-1000  . insulin aspart  0-9 Units Subcutaneous TID WC  . pneumococcal 23 valent vaccine  0.5 mL Intramuscular Tomorrow-1000  . potassium chloride   40 mEq Oral BID WC   Continuous Infusions: . sodium chloride 100 mL/hr at 07/22/18 0621  . ceFEPime (MAXIPIME) IV 2 g (07/22/18 0559)  . heparin 1,400 Units/hr (07/22/18 1226)  . vancomycin 1,000 mg (07/22/18 0452)     LOS: 1 day    Time spent: 35 minutes.     Elmarie Shiley, MD [facility] Pager [phone]  If 7PM-7AM, please contact night-coverage [email or web address] Password [email or web address] 07/22/2018, 2:29 PM

## 2018-07-22 NOTE — Progress Notes (Signed)
[facility] [place]  Subjective  - No acute events.   Objective (!) 177/86 64 98.5 F (36.9 C) (Oral) 16 99%  Intake/Output Summary (Last 24 hours) at 07/22/2018 1052 Last data filed at 07/22/2018 0832 Gross per 24 hour  Intake 360 ml  Output -  Net 360 ml    General: Palpable femoral pulses Palpable R DP Monophasic left DP  Laboratory Lab Results: Recent Labs    07/21/18 0715 07/22/18 0430  WBC 7.3 7.0  HGB 14.1 14.7  HCT 42.2 43.8  PLT 186 180   BMET Recent Labs    07/21/18 0715 07/22/18 0430  NA 140 140  K 3.6 3.3*  CL 110 108  CO2 23 26  GLUCOSE 151* 151*  BUN 10 6*  CREATININE 0.74 0.55*  CALCIUM 8.5* 8.7*    COAG Lab Results  Component Value Date   INR 0.87 07/20/2018   No results found for: PTT  Assessment/Planning: 67 yo 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  Monophasic L DP on exam.  Plan for LLE arteriogram Wednesday.  Continue antibiotics.  Pending how his foot responds to antibiotics will likely need more than just simple toe amputation and discussed at risk for leg amputation.  Calf pressure yesterday appears inadequate for even BKA.    Marty Heck 07/22/2018 10:52 AM --

## 2018-07-22 NOTE — Plan of Care (Signed)

## 2018-07-23 LAB — CBC
HCT: 47.6 % (ref 39.0–52.0)
Hemoglobin: 16 g/dL (ref 13.0–17.0)
MCH: 30 pg (ref 26.0–34.0)
MCHC: 33.6 g/dL (ref 30.0–36.0)
MCV: 89.3 fL (ref 80.0–100.0)
NRBC: 0 % (ref 0.0–0.2)
PLATELETS: 212 10*3/uL (ref 150–400)
RBC: 5.33 MIL/uL (ref 4.22–5.81)
RDW: 13.1 % (ref 11.5–15.5)
WBC: 7.6 10*3/uL (ref 4.0–10.5)

## 2018-07-23 LAB — BASIC METABOLIC PANEL
Anion gap: 9 (ref 5–15)
CHLORIDE: 105 mmol/L (ref 98–111)
CO2: 24 mmol/L (ref 22–32)
CREATININE: 0.63 mg/dL (ref 0.61–1.24)
Calcium: 9.2 mg/dL (ref 8.9–10.3)
GFR calc Af Amer: >60 mL/min (ref >60)
GLUCOSE: 105 mg/dL — AB (ref 70–99)
Potassium: 3.7 mmol/L (ref 3.5–5.1)
SODIUM: 138 mmol/L (ref 135–145)

## 2018-07-23 LAB — HEPARIN LEVEL (UNFRACTIONATED): Heparin Unfractionated: 0.41 IU/mL (ref 0.30–0.70)

## 2018-07-23 LAB — GLUCOSE, CAPILLARY
GLUCOSE-CAPILLARY: 147 mg/dL — AB (ref 70–99)
GLUCOSE-CAPILLARY: 195 mg/dL — AB (ref 70–99)
Glucose-Capillary: 128 mg/dL — ABNORMAL HIGH (ref 70–99)
Glucose-Capillary: 131 mg/dL — ABNORMAL HIGH (ref 70–99)

## 2018-07-23 LAB — VANCOMYCIN, TROUGH: Vancomycin Tr: 10 ug/mL — ABNORMAL LOW (ref 15–20)

## 2018-07-23 LAB — MAGNESIUM: MAGNESIUM: 1.9 mg/dL (ref 1.7–2.4)

## 2018-07-23 MED ORDER — ASPIRIN EC 81 MG PO TBEC
81.0000 mg | DELAYED_RELEASE_TABLET | Freq: Every day | ORAL | Status: DC
  Administered 2018-07-23 – 2018-08-05 (×14): 81 mg via ORAL
  Filled 2018-07-23 (×14): qty 1

## 2018-07-23 MED ORDER — ATORVASTATIN CALCIUM 20 MG PO TABS
20.0000 mg | ORAL_TABLET | Freq: Every day | ORAL | Status: DC
  Administered 2018-07-23 – 2018-08-05 (×14): 20 mg via ORAL
  Filled 2018-07-23 (×14): qty 1

## 2018-07-23 MED ORDER — VANCOMYCIN HCL 10 G IV SOLR
1250.0000 mg | Freq: Two times a day (BID) | INTRAVENOUS | Status: DC
  Administered 2018-07-24 (×2): 1250 mg via INTRAVENOUS
  Filled 2018-07-23 (×4): qty 1250

## 2018-07-23 NOTE — Progress Notes (Signed)
PROGRESS NOTE    John Calandra Sr.  ZTI:[id number] DOB: 10-02-1950 DOA: 07/19/2018 PCP: Patient, No Pcp Per   Brief Narrative: Abdulkarim Eberlin  is a 67 y.o. male, w dm2, who c/o left foot pain x 3 months with tip of 1st digit left foot black and discolored.  Pt apparently presented to ED due to redness on the dorsum of the left foot. Pt notes trauma about 67months ago when he dropped something on his foot.  Pt denies fever, chills, cp, palp, sob, n/v, diarrhea, brbpr.  Pt can't afford diabetic medication but can apparently afford cigarettes.    Xray L foot IMPRESSION: 1. No fracture deformity or dislocation. No radiographic findings of osteomyelitis. 2. Cystic lesion first metatarsal head may reflect gout or geode. 3. Severe vascular calcifications.  Patient was admitted for cellulitis and dry gangrene tip of 1st toe left foot due to presumed PAD. He was evaluated by vascular, plan is for arteriogram 10-30. He might required amputation.    Assessment & Plan:   Principal Problem:   PAD (peripheral artery disease) (HCC) Active Problems:   Cellulitis in diabetic foot (HCC)   Diabetes mellitus, type II (HCC)   Cellulitis   Dry gangrene ([place])  1-Left LE  Chronic ischemia, first toe superimpose cellulitis Continue with IV antibiotics.  On Heparin Gtt Appreciate vascular. Plan for arteriogram on Wednesday.  Follow blood culture; no growth to date.  Per vascular calf pressure appears inadequate for even BKA>  Will start aspirin and statins.  Check lipid panel.   2-HTN; PRN hydralazine.  Started  norvasc.    3-DM; SSI; check HBA1c. 7.4 Will need medications at discharge.  4-Tobacco use; counseling provided.   Hypokalemia; replaced/    DVT prophylaxis: heparin  Code Status:  Full code.  Family Communication: no family at bedside.  Disposition Plan: remain inpatient for IV antibiotics and heparin gtt  Consultants:   Vascular.    Procedures:    ABI   Antimicrobials:   Vancomycin  cefepime   Subjective: No new complaints. Denies chest pain or dyspnea.    Objective: Vitals:   07/22/18 2100 07/22/18 2217 07/22/18 2326 07/23/18 0627  BP: (!) 174/88 (!) 160/85 (!) 174/78 (!) 165/90  Pulse: (!) 57 64  73  Resp: 18     Temp: 98.8 F (37.1 C)   98.2 F (36.8 C)  TempSrc: Oral   Oral  SpO2: 100%   100%  Weight:      Height:        Intake/Output Summary (Last 24 hours) at 07/23/2018 1428 Last data filed at 07/22/2018 1949 Gross per 24 hour  Intake -  Output 0 ml  Net 0 ml   Filed Weights   07/20/18 1710 07/21/18 0459 07/22/18 0600  Weight: 65 kg 69 kg 68.4 kg    Examination:  General exam: NAD Respiratory system: CTA Cardiovascular system: S 1, S 2 RRR Gastrointestinal system: BS present, soft, nt Central nervous system: Alert, non focal.  Extremities: Symmetric power.   Skin :first toe with discoloration, necrosis, foot with redness,      Data Reviewed: I have personally reviewed following labs and imaging studies  CBC: Recent Labs  Lab 07/20/18 0039 07/21/18 0715 07/22/18 0430 07/23/18 0339  WBC 8.7 7.3 7.0 7.6  NEUTROABS 5.3  --   --   --   HGB 15.8 14.1 14.7 16.0  HCT 46.8 42.2 43.8 47.6  MCV 90.9 91.1 89.6 89.3  PLT 198 186 180 212  Basic Metabolic Panel: Recent Labs  Lab 07/20/18 0039 07/20/18 1832 07/21/18 0715 07/22/18 0430 07/23/18 0339  NA 138 140 140 140 138  K 3.0* 3.6 3.6 3.3* 3.7  CL 105 109 110 108 105  CO2 24 22 23 26 24   GLUCOSE 200* 100* 151* 151* 105*  BUN 7* 5* 10 6* <5*  CREATININE 0.60* 0.63 0.74 0.55* 0.63  CALCIUM 8.7* 8.6* 8.5* 8.7* 9.2  MG  --   --   --   --  1.9   GFR: Estimated Creatinine Clearance: 87.9 mL/min (by C-G formula based on SCr of 0.63 mg/dL). Liver Function Tests: Recent Labs  Lab 07/20/18 0039  AST 39  ALT 38  ALKPHOS 85  BILITOT 0.6  PROT 7.3  ALBUMIN 3.6   No results for input(s): LIPASE, AMYLASE in the last 168  hours. No results for input(s): AMMONIA in the last 168 hours. Coagulation Profile: Recent Labs  Lab 07/20/18 0039  INR 0.87   Cardiac Enzymes: No results for input(s): CKTOTAL, CKMB, CKMBINDEX, TROPONINI in the last 168 hours. BNP (last 3 results) No results for input(s): PROBNP in the last 8760 hours. HbA1C: Recent Labs    07/21/18 0715  HGBA1C 7.4*   CBG: Recent Labs  Lab 07/22/18 1140 07/22/18 1621 07/22/18 2106 07/23/18 0748 07/23/18 1213  GLUCAP 129* 143* 117* 147* 128*   Lipid Profile: No results for input(s): CHOL, HDL, LDLCALC, TRIG, CHOLHDL, LDLDIRECT in the last 72 hours. Thyroid Function Tests: No results for input(s): TSH, T4TOTAL, FREET4, T3FREE, THYROIDAB in the last 72 hours. Anemia Panel: No results for input(s): VITAMINB12, FOLATE, FERRITIN, TIBC, IRON, RETICCTPCT in the last 72 hours. Sepsis Labs: Recent Labs  Lab 07/20/18 0051 07/20/18 0213 07/20/18 1832 07/20/18 2121  LATICACIDVEN 2.09* 2.44* 1.2 1.0    Recent Results (from the past 240 hour(s))  Blood Culture (routine x 2)     Status: None (Preliminary result)   Collection Time: 07/20/18 12:39 AM  Result Value Ref Range Status   Specimen Description RIGHT ANTECUBITAL  Final   Special Requests   Final    BOTTLES DRAWN AEROBIC AND ANAEROBIC Blood Culture adequate volume   Culture   Final    NO GROWTH 3 DAYS Performed at [facility], [address] [place], [place] [address]    Report Status PENDING  Incomplete  Blood Culture (routine x 2)     Status: None (Preliminary result)   Collection Time: 07/20/18 12:42 AM  Result Value Ref Range Status   Specimen Description RIGHT ANTECUBITAL  Final   Special Requests   Final    BOTTLES DRAWN AEROBIC AND ANAEROBIC Blood Culture adequate volume   Culture   Final    NO GROWTH 3 DAYS Performed at [facility], [address], [place],  [address]    Report Status PENDING  Incomplete         Radiology Studies: Vas [place] Abi With/wo  Tbi  Result Date: 07/21/2018 LOWER EXTREMITY DOPPLER STUDY Indications: Peripheral artery disease.  Performing Technologist: Birdena Crandall, [place] [facility]  Examination Guidelines: A complete evaluation includes at minimum, Doppler waveform signals and systolic blood pressure reading at the level of bilateral brachial, anterior tibial, and posterior tibial arteries, when vessel segments are accessible. Bilateral testing is considered an integral part of a complete examination. Photoelectric Plethysmograph (PPG) waveforms and toe systolic pressure readings are included as required and additional duplex testing as needed. Limited examinations for reoccurring indications may be performed as noted.  ABI Findings: +---------+------------------+-----+---------+--------+  Right    Rt Pressure (mmHg)IndexWaveform Comment  +---------+------------------+-----+---------+--------+ Brachial 188                    triphasic         +---------+------------------+-----+---------+--------+ PTA      176               0.94 triphasic         +---------+------------------+-----+---------+--------+ DP       177               0.94 triphasic         +---------+------------------+-----+---------+--------+ Great Toe63                0.34                   +---------+------------------+-----+---------+--------+ +---------+-----------------+-----+---------------------------+----------------+ Left     Lt Pressure      IndexWaveform                   Comment                   (mmHg)                                                            +---------+-----------------+-----+---------------------------+----------------+ Brachial 160                   triphasic                                   +---------+-----------------+-----+---------------------------+----------------+ PTA      42               0.22 severely dampened mnophasic                  +---------+-----------------+-----+---------------------------+----------------+ DP       57               0.30 severely dampened                                                          monophasic                                  +---------+-----------------+-----+---------------------------+----------------+ Great Toe0                0.00                            absent waveforms +---------+-----------------+-----+---------------------------+----------------+ Calf     51               0.27 severely dampened                                                          monophasic                                  +---------+-----------------+-----+---------------------------+----------------+ +-------+-----------+-----------+------------+------------+  ABI/TBIToday's ABIToday's TBIPrevious ABIPrevious TBI +-------+-----------+-----------+------------+------------+ Right  0.94       0.34                                +-------+-----------+-----------+------------+------------+ Left   0.30       0.00                                +-------+-----------+-----------+------------+------------+ Technically difficult to evaluate Doppler waveforms on the left due to venous flow interference.  Summary: See technical note listed above Right: Resting right ankle-brachial index is within normal range. No evidence of significant right lower extremity arterial disease. The right toe-brachial index is abnormal. Left: Resting left ankle-brachial index indicates severe left lower extremity arterial disease. The left toe-brachial index is abnormal.  *See table(s) above for measurements and observations.  Electronically signed by Monica Martinez MD on 07/21/2018 at 4:00:50 PM.    Final         Scheduled Meds: . amLODipine  5 mg Oral Daily  . aspirin EC  81 mg Oral Daily  . atorvastatin  20 mg Oral q1800  . Influenza vac split quadrivalent PF  0.5 mL Intramuscular Tomorrow-1000    . insulin aspart  0-9 Units Subcutaneous TID WC  . nicotine  14 mg Transdermal Daily  . pneumococcal 23 valent vaccine  0.5 mL Intramuscular Tomorrow-1000   Continuous Infusions: . sodium chloride 100 mL/hr at 07/22/18 1644  . ceFEPime (MAXIPIME) IV 2 g (07/23/18 0659)  . heparin 1,400 Units/hr (07/23/18 0844)  . vancomycin 1,000 mg (07/23/18 0658)     LOS: 2 days    Time spent: 35 minutes.     Elmarie Shiley, MD [facility] Pager [phone]  If 7PM-7AM, please contact night-coverage [email or web address] Password [email or web address] 07/23/2018, 2:28 PM

## 2018-07-23 NOTE — Plan of Care (Signed)

## 2018-07-23 NOTE — Progress Notes (Signed)
[facility]  Subjective  - No acute events.   Objective (!) 165/90 73 98.2 F (36.8 C) (Oral) 18 100%  Intake/Output Summary (Last 24 hours) at 07/23/2018 0820 Last data filed at 07/22/2018 1949 Gross per 24 hour  Intake 720 ml  Output 0 ml  Net 720 ml    General: Palpable femoral pulses Palpable R DP Monophasic left DP  Laboratory Lab Results: Recent Labs    07/22/18 0430 07/23/18 0339  WBC 7.0 7.6  HGB 14.7 16.0  HCT 43.8 47.6  PLT 180 212   BMET Recent Labs    07/21/18 0715 07/22/18 0430  NA 140 140  K 3.6 3.3*  CL 110 108  CO2 23 26  GLUCOSE 151* 151*  BUN 10 6*  CREATININE 0.74 0.55*  CALCIUM 8.5* 8.7*    COAG Lab Results  Component Value Date   INR 0.87 07/20/2018   No results found for: PTT  Assessment/Planning: 67 yo 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  Monophasic L DP on exam.  Plan for LLE arteriogram tomorrow.  Make sure he has aspirin and statin.  Needs to stop smoking and discussed with patient.  Pending revascularization findings will then need amp - possible toe, TMA vs BKA.  Calf pressure 50's and likely inadequate for even BKA - hopeful we can revascularize in PV lab tomorrow.  Marty Heck 07/23/2018 8:20 AM --

## 2018-07-23 NOTE — Progress Notes (Signed)
Pharmacy Antibiotic Note  John Sparling Sr. is a 67 y.o. male admitted on 07/19/2018 with cellulitis / dry gangrene.  Pharmacy has been consulted for vancomycin and cefepime dosing.  VT today was drawn early and resulted at 92. Will plan to shoot for 10-55mcg/mL.   Plan: Increase vancomycin to 1,250 mg IV every 12 hours Continue cefepime 2000 mg IV every 12 hours. Monitor clinical picture, renal function, VT prn F/U C&S, abx deescalation / LOT  Height: 5\' 11"  (180.3 cm) Weight: 150 lb 12.7 oz (68.4 kg) IBW/kg (Calculated) : 75.3  Temp (24hrs), Avg:98.5 F (36.9 C), Min:98.2 F (36.8 C), Max:98.8 F (37.1 C)  Recent Labs  Lab 07/20/18 0039 07/20/18 0051 07/20/18 0213 07/20/18 1832 07/20/18 2121 07/21/18 0715 07/22/18 0430 07/23/18 0339 07/23/18 1653  WBC 8.7  --   --   --   --  7.3 7.0 7.6  --   CREATININE 0.60*  --   --  0.63  --  0.74 0.55* 0.63  --   LATICACIDVEN  --  2.09* 2.44* 1.2 1.0  --   --   --   --   VANCOTROUGH  --   --   --   --   --   --   --   --  10*    Estimated Creatinine Clearance: 87.9 mL/min (by C-G formula based on SCr of 0.63 mg/dL).    Allergies  Allergen Reactions  . Tea Itching and Rash    Thank you for allowing pharmacy to be a part of this patient's care.  [name] 07/23/2018 6:11 PM

## 2018-07-23 NOTE — Progress Notes (Signed)
[name] for heparin Indication: critical limb ischemia  Allergies  Allergen Reactions  . Tea Itching and Rash    Patient Measurements: Height: 5\' 11"  (180.3 cm) Weight: 150 lb 12.7 oz (68.4 kg) IBW/kg (Calculated) : 75.3 HEPARIN DW (KG): 65   Vital Signs: Temp: 98.2 F (36.8 C) (10/29 0627) Temp Source: Oral (10/29 0627) BP: 165/90 (10/29 0627) Pulse Rate: 73 (10/29 0627)  Labs: Recent Labs    07/21/18 0715  07/21/18 1615 07/22/18 0430 07/23/18 0339  HGB 14.1  --   --  14.7 16.0  HCT 42.2  --   --  43.8 47.6  PLT 186  --   --  180 212  HEPARINUNFRC  --    < > 0.60 0.55 0.41  CREATININE 0.74  --   --  0.55* 0.63   < > = values in this interval not displayed.   Estimated Creatinine Clearance: 87.9 mL/min (by C-G formula based on SCr of 0.63 mg/dL).  Medical History: Past Medical History:  Diagnosis Date  . Arthritis   . Diabetes mellitus without complication ([place])   . Emesis   . Heart murmur   . Hyperglycemia   . Hypertension   . Joint pain     Medications:  Scheduled:  . amLODipine  5 mg Oral Daily  . Influenza vac split quadrivalent PF  0.5 mL Intramuscular Tomorrow-1000  . insulin aspart  0-9 Units Subcutaneous TID WC  . nicotine  14 mg Transdermal Daily  . pneumococcal 23 valent vaccine  0.5 mL Intramuscular Tomorrow-1000   Infusions:  . sodium chloride 100 mL/hr at 07/22/18 1644  . ceFEPime (MAXIPIME) IV 2 g (07/23/18 0659)  . heparin 1,400 Units/hr (07/23/18 0844)  . vancomycin 1,000 mg (07/23/18 5035)   PRN:   Assessment: 67 yo male presented with left foot pain thought to be from trauma of dropping furnace on it a month ago. LLL critical limb ischemia, on heparin pending arteriogram. Heparin level therapeutic at 0.41. Hgb, PLTC wnl, stable, no bleeding reported per RN   Goal of Therapy:  Heparin level 0.3-0.7 units/ml  Monitor platelets by anticoagulation protocol: Yes   Plan:  Continue heparin  infusion to 1,400 units/hr Monitor daily heparin level and CBC, s/sx bleeding LEE arteriogram planned for [place], PharmD., BCPS Clinical Pharmacist Clinical phone for 07/23/18 until 3:30pm: [id number] If after 3:30pm, please refer to [facility] for unit-specific pharmacist

## 2018-07-24 ENCOUNTER — Inpatient Hospital Stay (HOSPITAL_COMMUNITY): Payer: Medicare Other

## 2018-07-24 ENCOUNTER — Encounter (HOSPITAL_COMMUNITY): Payer: Self-pay | Admitting: Vascular Surgery

## 2018-07-24 ENCOUNTER — Encounter (HOSPITAL_COMMUNITY)
Admission: EM | Disposition: A | Discharge status: Rehabilitation Facility | Payer: Self-pay | Source: Home / Self Care | Attending: Internal Medicine

## 2018-07-24 DIAGNOSIS — I5030 Unspecified diastolic (congestive) heart failure: Secondary | ICD-10-CM

## 2018-07-24 DIAGNOSIS — I998 Other disorder of circulatory system: Secondary | ICD-10-CM

## 2018-07-24 HISTORY — PX: PERIPHERAL VASCULAR INTERVENTION: CATH118257

## 2018-07-24 HISTORY — PX: LOWER EXTREMITY ANGIOGRAPHY: CATH118251

## 2018-07-24 LAB — CBC
HCT: 43 % (ref 39.0–52.0)
HEMOGLOBIN: 14 g/dL (ref 13.0–17.0)
MCH: 29.1 pg (ref 26.0–34.0)
MCHC: 32.6 g/dL (ref 30.0–36.0)
MCV: 89.4 fL (ref 80.0–100.0)
NRBC: 0 % (ref 0.0–0.2)
PLATELETS: 189 10*3/uL (ref 150–400)
RBC: 4.81 MIL/uL (ref 4.22–5.81)
RDW: 13.1 % (ref 11.5–15.5)
WBC: 6.4 10*3/uL (ref 4.0–10.5)

## 2018-07-24 LAB — BASIC METABOLIC PANEL
ANION GAP: 8 (ref 5–15)
BUN: 6 mg/dL — ABNORMAL LOW (ref 8–23)
CALCIUM: 8.8 mg/dL — AB (ref 8.9–10.3)
CO2: 22 mmol/L (ref 22–32)
Chloride: 108 mmol/L (ref 98–111)
Creatinine, Ser: 0.55 mg/dL — ABNORMAL LOW (ref 0.61–1.24)
GFR calc non Af Amer: >60 mL/min (ref >60)
Glucose, Bld: 116 mg/dL — ABNORMAL HIGH (ref 70–99)
POTASSIUM: 3.7 mmol/L (ref 3.5–5.1)
Sodium: 138 mmol/L (ref 135–145)

## 2018-07-24 LAB — POCT ACTIVATED CLOTTING TIME: Activated Clotting Time: 329 seconds

## 2018-07-24 LAB — GLUCOSE, CAPILLARY
GLUCOSE-CAPILLARY: 107 mg/dL — AB (ref 70–99)
GLUCOSE-CAPILLARY: 116 mg/dL — AB (ref 70–99)
Glucose-Capillary: 133 mg/dL — ABNORMAL HIGH (ref 70–99)
Glucose-Capillary: 157 mg/dL — ABNORMAL HIGH (ref 70–99)

## 2018-07-24 LAB — LIPID PANEL
Cholesterol: 103 mg/dL (ref 0–200)
HDL: 40 mg/dL — ABNORMAL LOW (ref >40)
LDL Cholesterol: 52 mg/dL (ref 0–99)
Total CHOL/HDL Ratio: 2.6 RATIO
Triglycerides: 53 mg/dL (ref <150)
VLDL: 11 mg/dL (ref 0–40)

## 2018-07-24 LAB — ECHOCARDIOGRAM COMPLETE
HEIGHTINCHES: 71 in
WEIGHTICAEL: 2412.71 [oz_av]

## 2018-07-24 LAB — HEPARIN LEVEL (UNFRACTIONATED): HEPARIN UNFRACTIONATED: 0.61 [IU]/mL (ref 0.30–0.70)

## 2018-07-24 SURGERY — LOWER EXTREMITY ANGIOGRAPHY
Anesthesia: LOCAL

## 2018-07-24 MED ORDER — HYDRALAZINE HCL 20 MG/ML IJ SOLN
5.0000 mg | INTRAMUSCULAR | Status: AC | PRN
  Administered 2018-07-25 (×2): 5 mg via INTRAVENOUS
  Filled 2018-07-24 (×2): qty 1

## 2018-07-24 MED ORDER — CLOPIDOGREL BISULFATE 300 MG PO TABS
ORAL_TABLET | ORAL | Status: AC
  Filled 2018-07-24: qty 1

## 2018-07-24 MED ORDER — HEPARIN (PORCINE) IN NACL 1000-0.9 UT/500ML-% IV SOLN
INTRAVENOUS | Status: AC
  Filled 2018-07-24: qty 1000

## 2018-07-24 MED ORDER — LIDOCAINE HCL (PF) 1 % IJ SOLN
INTRAMUSCULAR | Status: AC
  Filled 2018-07-24: qty 30

## 2018-07-24 MED ORDER — FENTANYL CITRATE (PF) 100 MCG/2ML IJ SOLN
INTRAMUSCULAR | Status: AC
  Filled 2018-07-24: qty 2

## 2018-07-24 MED ORDER — LABETALOL HCL 5 MG/ML IV SOLN: 10.0000 mg | INTRAVENOUS | Status: DC | PRN

## 2018-07-24 MED ORDER — FENTANYL CITRATE (PF) 100 MCG/2ML IJ SOLN
INTRAMUSCULAR | Status: DC | PRN
  Administered 2018-07-24: 25 ug via INTRAVENOUS

## 2018-07-24 MED ORDER — MIDAZOLAM HCL 2 MG/2ML IJ SOLN
INTRAMUSCULAR | Status: AC
  Filled 2018-07-24: qty 2

## 2018-07-24 MED ORDER — HEPARIN (PORCINE) IN NACL 1000-0.9 UT/500ML-% IV SOLN
INTRAVENOUS | Status: DC | PRN
  Administered 2018-07-24: 500 mL

## 2018-07-24 MED ORDER — SODIUM CHLORIDE 0.9 % IV SOLN
250.0000 mL | INTRAVENOUS | Status: DC | PRN
  Administered 2018-07-25 – 2018-08-04 (×2): 250 mL via INTRAVENOUS

## 2018-07-24 MED ORDER — MIDAZOLAM HCL 2 MG/2ML IJ SOLN
INTRAMUSCULAR | Status: DC | PRN
  Administered 2018-07-24: 2 mg via INTRAVENOUS

## 2018-07-24 MED ORDER — CLOPIDOGREL BISULFATE 300 MG PO TABS
ORAL_TABLET | ORAL | Status: DC | PRN
  Administered 2018-07-24: 300 mg via ORAL

## 2018-07-24 MED ORDER — IODIXANOL 320 MG/ML IV SOLN
INTRAVENOUS | Status: DC | PRN
  Administered 2018-07-24: 105 mL via INTRA_ARTERIAL

## 2018-07-24 MED ORDER — ACETAMINOPHEN 325 MG PO TABS
650.0000 mg | ORAL_TABLET | ORAL | Status: DC | PRN
  Administered 2018-07-24 – 2018-07-26 (×4): 650 mg via ORAL
  Filled 2018-07-24 (×4): qty 2

## 2018-07-24 MED ORDER — OXYCODONE HCL 5 MG PO TABS
5.0000 mg | ORAL_TABLET | ORAL | Status: DC | PRN
  Administered 2018-07-25 (×3): 10 mg via ORAL
  Administered 2018-07-25: 5 mg via ORAL
  Administered 2018-07-26 – 2018-08-01 (×15): 10 mg via ORAL
  Filled 2018-07-24 (×19): qty 2

## 2018-07-24 MED ORDER — HEPARIN SODIUM (PORCINE) 1000 UNIT/ML IJ SOLN
INTRAMUSCULAR | Status: DC | PRN
  Administered 2018-07-24: 7000 [IU] via INTRAVENOUS

## 2018-07-24 MED ORDER — ONDANSETRON HCL 4 MG/2ML IJ SOLN: 4.0000 mg | Freq: Four times a day (QID) | INTRAMUSCULAR | Status: DC | PRN

## 2018-07-24 MED ORDER — CLOPIDOGREL BISULFATE 75 MG PO TABS
75.0000 mg | ORAL_TABLET | Freq: Every day | ORAL | Status: DC
  Administered 2018-07-25 – 2018-08-05 (×12): 75 mg via ORAL
  Filled 2018-07-24 (×12): qty 1

## 2018-07-24 MED ORDER — LIDOCAINE HCL (PF) 1 % IJ SOLN
INTRAMUSCULAR | Status: DC | PRN
  Administered 2018-07-24: 15 mL

## 2018-07-24 MED ORDER — SODIUM CHLORIDE 0.9 % WEIGHT BASED INFUSION
1.0000 mL/kg/h | INTRAVENOUS | Status: AC
  Administered 2018-07-24: 1 mL/kg/h via INTRAVENOUS

## 2018-07-24 MED ORDER — SODIUM CHLORIDE 0.9% FLUSH
3.0000 mL | Freq: Two times a day (BID) | INTRAVENOUS | Status: DC
  Administered 2018-07-24 – 2018-07-29 (×9): 3 mL via INTRAVENOUS

## 2018-07-24 MED ORDER — SODIUM CHLORIDE 0.9% FLUSH: 3.0000 mL | INTRAVENOUS | Status: DC | PRN

## 2018-07-24 SURGICAL SUPPLY — 25 items
BALLN MUSTANG 5X150X135 (BALLOONS) ×3
BALLN MUSTANG 5X80X135 (BALLOONS) ×3
BALLOON MUSTANG 5X150X135 (BALLOONS) ×2 IMPLANT
BALLOON MUSTANG 5X80X135 (BALLOONS) ×2 IMPLANT
CATH OMNI FLUSH 5F 65CM (CATHETERS) ×3 IMPLANT
CATH QUICKCROSS .035X135CM (MICROCATHETER) ×3 IMPLANT
CLOSURE MYNX CONTROL 6F/7F (Vascular Products) ×3 IMPLANT
DEVICE TORQUE .025-.038 (MISCELLANEOUS) ×3 IMPLANT
GLIDEWIRE ADV .035X260CM (WIRE) ×3 IMPLANT
GUIDEWIRE ANGLED .035X150CM (WIRE) ×3 IMPLANT
KIT ENCORE 26 ADVANTAGE (KITS) ×3 IMPLANT
KIT MICROPUNCTURE NIT STIFF (SHEATH) ×3 IMPLANT
KIT PV (KITS) ×3 IMPLANT
SHEATH FLEX ANSEL ANG 6F 45CM (SHEATH) ×3 IMPLANT
SHEATH FLEXOR ANSEL 1 7F 45CM (SHEATH) ×3 IMPLANT
SHEATH PINNACLE 5F 10CM (SHEATH) ×3 IMPLANT
SHEATH PINNACLE 7F 10CM (SHEATH) ×3 IMPLANT
SHEATH PINNACLE MP 6F 45CM (SHEATH) ×3 IMPLANT
SHEATH PROBE COVER 6X72 (BAG) ×3 IMPLANT
STENT ELUVIA 6X80X130 (Permanent Stent) ×3 IMPLANT
STENT EXPRESS LD 9X25X75 (Permanent Stent) ×3 IMPLANT
SYR MEDRAD MARK V 150ML (SYRINGE) ×3 IMPLANT
TRANSDUCER W/STOPCOCK (MISCELLANEOUS) ×3 IMPLANT
TRAY PV CATH (CUSTOM PROCEDURE TRAY) ×3 IMPLANT
WIRE BENTSON .035X145CM (WIRE) ×3 IMPLANT

## 2018-07-24 NOTE — Progress Notes (Addendum)
Posted tomorrow for L great toe amp vs TMA.  Will need to continue Plavix since L CIA and SFA stent. Can stop heparin drip.  Marty Heck, MD [facility] [place] Office: [phone] Pager: [place]

## 2018-07-24 NOTE — Progress Notes (Signed)
Nutrition Consult - Education Note  RD consulted for nutrition education regarding diabetes.   Lab Results  Component Value Date   HGBA1C 7.4 (H) 07/21/2018   Dietitian-student provided "Type 2 Diabetes Nutrition Therapy" handout from the Academy of [employer] and Dietetics. Discussed different food groups and their effects on blood sugar, emphasizing carbohydrate-containing foods. Provided list of carbohydrates and recommended serving sizes of common foods.   Discussed importance of controlled and consistent carbohydrate intake throughout the day. Provided examples of ways to balance meals/snacks and encouraged intake of high-fiber, whole grain complex carbohydrates. Teach back method used.  Expect fair compliance. Pt currently consuming diet high in refined carbohydrates including soda, beer, white bread, and sweets. Pt currently without insurance and reports that he at one time used glucose meter but that the battery died and he hasn't replaced it. Pt with no PCP and reports taking no home meds for diabetes. Pt reports willingness to participate in diabetes Donny Heffern-management once insurance established.   Body mass index is 21.03 kg/m. Pt currently has normal BMI.   Current diet order is Carb modified. Pt consuming 90-100% of meals at this time. Labs and medications reviewed. No further nutrition interventions warranted at this time. If additional nutrition issues arise, please re-consult RD.   [place], Dietetic Intern [phone]

## 2018-07-24 NOTE — Progress Notes (Signed)
  Echocardiogram 2D Echocardiogram has been performed.  [name] 07/24/2018, 3:43 PM

## 2018-07-24 NOTE — Plan of Care (Signed)
  Problem: Activity: Goal: Risk for activity intolerance will decrease Outcome: Progressing   Problem: Coping: Goal: Level of anxiety will decrease Outcome: Progressing   Problem: Safety: Goal: Ability to remain free from injury will improve Outcome: Progressing   

## 2018-07-24 NOTE — Op Note (Signed)
Patient name: John Heber Sr. MRN: [id number] DOB: Nov 09, 1950 Sex: male  07/24/2018 Pre-operative Diagnosis: Critical limb ischemia of the left lower extremity with tissue loss Post-operative diagnosis:  Same Surgeon:  Marty Heck, MD Procedure Performed: 1.  Ultrasound-guided access of the right common femoral artery 2.  Aortogram with left lower extremity arteriogram including selection of secondary branches 3.  Recanalization of left SFA/above knee popliteal artery chronic total occlusion with balloon angioplasty and stent placement (5 mm x 150 mm Mustang and 6 mm x 80 mm Eluvia drug-coated stent) 4.  Left common iliac angioplasty with stent placement (9 mm x 25 mm express LD) 5.  Mynx closure of the right common femoral artery 6.  77 minutes of monitored moderate conscious sedation time  Indications: Patient is a 67 year old male who was transferred here this weekend with initial concern for acute limb ischemia.  Ultimately it was found that he had critical limb ischemia with tissue loss that had been ongoing for two months with a monophasic dorsalis pedis signal in his left foot.  He presents today for left lower extremity arteriogram for revascularization and also understands he will need to undergo at least a toe amputation if not a TMA or BKA of his left foot.  Findings: Aortogram revealed that the patient had heavily calcified iliac arteries bilaterally.  On the left which was the affected extremity he had a greater than 80% stenosis of his left common iliac artery that was heavily calcified.  This lesion was treated with a 9 mm x 25 mm express LD balloon mounted stent.  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  Ultimately the chronic total occlusion was crossed and  this was balloon angioplastied and stented with a self-expanding drug-eluting Eluvia 6 mm x 80 mm stent given a short focal dissection.  At the completion of the case the patient had a palpable dorsalis pedis pulse in the left foot.   Procedure:  The patient was identified in the holding area and taken to room 8.  The patient was then placed supine on the table and prepped and draped in the usual sterile fashion.  A time out was called.  Ultrasound was used to evaluate the right common femoral artery.  It was patent .  A digital ultrasound image was acquired.  A micropuncture needle was used to access the right common femoral artery under ultrasound guidance.  An 018 wire was advanced without resistance and a micropuncture sheath was placed.  The 018 wire was removed and a benson wire was placed.  The micropuncture sheath was exchanged for a 5 french sheath.  An omniflush catheter was advanced over the wire to the level of L-1.  An abdominal angiogram was obtained.  Next, using the omniflush catheter and a benson wire, the aortic bifurcation was crossed and the catheter was placed into theleft external iliac artery and left runoff was obtained.   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  At this point in time we made the decision to intervene on his left lower extremity and a Glidewire advantage was placed through our right  groin sheath into the left SFA.  We exchanged for a long 45 cm 6 French Ansell sheath in the right groin.  Patient was given 8000 units of IV heparin at this time.  Then used a Glidewire advantage and a quick cross catheter to cross the chronic total occlusion of his left distal SFA into the above-knee popliteal artery.  Our quick cross catheter was injected to confirm we were in the true  lumen distally.  This was then angioplastied with a 5 mm x 150 mm Mustang balloon.  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  I elected to treat this with a 6 mm x 80 mm self expanding drug coated Eluvia stent.  I then post ballooned this with a 6 mm by 80 mm Mustang and a final arteriogram pictures showed no residual stenosis and a widely patent SFA with preserved 2 vessel runoff via the peroneal and anterior tibial artery.  I then pulled my sheath back at the aortic bifurcation and obtained an iliac arteriogram and elected to treat his left common iliac artery inflow disease.  This artery measured 9 mm in normal diameter above the calcified lesion.  As a result I selected a 9 mm x 25 mm balloon expandable express LD.  In order to deploy the stent we had to exchange for a 7 [place] Ansell sheath in the right groin.  We then positioned our stent in the left common iliac artery and this was deployed to nominal pressure.  A final arteriogram injection through the sheath showed a widely patent left iliac artery stent with preserved runoff to the left external iliac artery.  Patient had a great left femoral pulse and left dorsalis pedis pulse.  At that point in time we exchanged for a short 7 French sheath in the right groin.  A mynx closure device was deployed in the right groin and he will be taken the PACU in stable condition.  Condition: Stable  Contrast: 105 mL   Marty Heck, MD [facility] [place] Office: [phone] Pager: [place]

## 2018-07-24 NOTE — Progress Notes (Signed)
PROGRESS NOTE    John Coles Sr.  GGY:[id number] DOB: 10-27-1950 DOA: 07/19/2018 PCP: Patient, No Pcp Per   Brief Narrative: [name]  is a 67 y.o. male, w dm2, who c/o left foot pain x 3 months with tip of 1st digit left foot black and discolored.  Pt apparently presented to ED due to redness on the dorsum of the left foot. Pt notes trauma about 5months ago when he dropped something on his foot.  Pt denies fever, chills, cp, palp, sob, n/v, diarrhea, brbpr.  Pt can't afford diabetic medication but can apparently afford cigarettes.  He came to the hospital he was diagnosed with left first toe gangrene highly suspicious for ischemic injury, vascular surgery was consulted and he was placed on empiric antibiotics along with heparin drip.   Subjective - patient in bed, appears comfortable, denies any headache, no fever, no chest pain or pressure, no shortness of breath , no abdominal pain. No focal weakness.  Continues to have mild left foot pain.  Assessment & Plan:   1. Left LE  Chronic ischemia, first toe gangrene with some surrounding cellulitis.  Continue with IV antibiotics.  Cultures negative, started on Dilantin continue heparin drip, going for arteriogram on 07/24/2018 by vascular surgery.  Most likely will require AKA versus BKA.  Continue combination of aspirin and statin as well.  Strictly counseled to quit smoking.  2. HTN; PRN hydralazine. Started  norvasc.   3. DM 2; SSI; check HBA1c. 7.4 - DM education ordered.  4. Tobacco use; counseling provided.   5. Hypokalemia; replaced & stable.  6.  Loud harsh systolic murmur mostly in the aortic valve area.  Check echocardiogram.  He is asymptomatic from the standpoint, EKG is nonacute, can proceed for surgery.     DVT prophylaxis: heparin  Code Status:  Full code.  Family Communication: family at bedside.  Disposition Plan: remain inpatient for IV antibiotics and heparin gtt  Consultants:   Vascular.     Procedures:   ABI  Angiogram - 07/24/18   Antimicrobials:   Vancomycin  cefepime   Objective:  Blood pressure (!) 159/99, pulse 64, temperature 98 F (36.7 C), temperature source Oral, resp. rate 14, height 5\' 11"  (1.803 m), weight 68.4 kg, SpO2 100 %.  Examination:  Awake Alert, Oriented X 3, No new F.N deficits, Normal affect [place].AT,PERRAL Supple Neck,No JVD, No cervical lymphadenopathy appriciated.  Symmetrical Chest wall movement, Good air movement bilaterally, CTAB RRR,No Gallops, Rubs, extremely loud pansystolic murmur in both mitral valve and aortic valve area, No Parasternal Heave +ve B.Sounds, Abd Soft, No tenderness, No organomegaly appriciated, No rebound - guarding or rigidity. No Cyanosis, Clubbing or edema, left foot and more so left first toe severely cyanotic, dry gangrenous ulcer at the tip of the first toe.   Data Reviewed: I have personally reviewed following labs and imaging studies  CBC: Recent Labs  Lab 07/20/18 0039 07/21/18 0715 07/22/18 0430 07/23/18 0339 07/24/18 0441  WBC 8.7 7.3 7.0 7.6 6.4  NEUTROABS 5.3  --   --   --   --   HGB 15.8 14.1 14.7 16.0 14.0  HCT 46.8 42.2 43.8 47.6 43.0  MCV 90.9 91.1 89.6 89.3 89.4  PLT 198 186 180 212 035   Basic Metabolic Panel: Recent Labs  Lab 07/20/18 1832 07/21/18 0715 07/22/18 0430 07/23/18 0339 07/24/18 0441  NA 140 140 140 138 138  K 3.6 3.6 3.3* 3.7 3.7  CL 109 110 108 105 108  CO2  22 23 26 24 22   GLUCOSE 100* 151* 151* 105* 116*  BUN 5* 10 6* <5* 6*  CREATININE 0.63 0.74 0.55* 0.63 0.55*  CALCIUM 8.6* 8.5* 8.7* 9.2 8.8*  MG  --   --   --  1.9  --    GFR: Estimated Creatinine Clearance: 87.9 mL/min (A) (by C-G formula based on SCr of 0.55 mg/dL (L)). Liver Function Tests: Recent Labs  Lab 07/20/18 0039  AST 39  ALT 38  ALKPHOS 85  BILITOT 0.6  PROT 7.3  ALBUMIN 3.6   No results for input(s): LIPASE, AMYLASE in the last 168 hours. No results for input(s): AMMONIA in  the last 168 hours. Coagulation Profile: Recent Labs  Lab 07/20/18 0039  INR 0.87   Cardiac Enzymes: No results for input(s): CKTOTAL, CKMB, CKMBINDEX, TROPONINI in the last 168 hours. BNP (last 3 results) No results for input(s): PROBNP in the last 8760 hours. HbA1C: No results for input(s): HGBA1C in the last 72 hours. CBG: Recent Labs  Lab 07/23/18 0748 07/23/18 1213 07/23/18 1639 07/23/18 2046 07/24/18 0643  GLUCAP 147* 128* 131* 195* 107*   Lipid Profile: Recent Labs    07/24/18 0441  CHOL 103  HDL 40*  LDLCALC 52  TRIG 53  CHOLHDL 2.6   Thyroid Function Tests: No results for input(s): TSH, T4TOTAL, FREET4, T3FREE, THYROIDAB in the last 72 hours. Anemia Panel: No results for input(s): VITAMINB12, FOLATE, FERRITIN, TIBC, IRON, RETICCTPCT in the last 72 hours. Sepsis Labs: Recent Labs  Lab 07/20/18 0051 07/20/18 0213 07/20/18 1832 07/20/18 2121  LATICACIDVEN 2.09* 2.44* 1.2 1.0    Recent Results (from the past 240 hour(s))  Blood Culture (routine x 2)     Status: None (Preliminary result)   Collection Time: 07/20/18 12:39 AM  Result Value Ref Range Status   Specimen Description RIGHT ANTECUBITAL  Final   Special Requests   Final    BOTTLES DRAWN AEROBIC AND ANAEROBIC Blood Culture adequate volume   Culture   Final    NO GROWTH 4 DAYS Performed at [facility], [address], [place], [place] [address]    Report Status PENDING  Incomplete  Blood Culture (routine x 2)     Status: None (Preliminary result)   Collection Time: 07/20/18 12:42 AM  Result Value Ref Range Status   Specimen Description RIGHT ANTECUBITAL  Final   Special Requests   Final    BOTTLES DRAWN AEROBIC AND ANAEROBIC Blood Culture adequate volume   Culture   Final    NO GROWTH 4 DAYS Performed at [facility], [address] [place], [place] [address]    Report Status PENDING  Incomplete     Radiology Studies: No results found.  Scheduled Meds: . amLODipine  5 mg Oral Daily   . aspirin EC  81 mg Oral Daily  . atorvastatin  20 mg Oral q1800  . Influenza vac split quadrivalent PF  0.5 mL Intramuscular Tomorrow-1000  . insulin aspart  0-9 Units Subcutaneous TID WC  . nicotine  14 mg Transdermal Daily  . pneumococcal 23 valent vaccine  0.5 mL Intramuscular Tomorrow-1000   Continuous Infusions: . sodium chloride 100 mL/hr at 07/24/18 0021  . ceFEPime (MAXIPIME) IV 2 g (07/24/18 0535)  . heparin 1,400 Units/hr (07/24/18 0406)  . vancomycin 1,250 mg (07/24/18 0023)     LOS: 3 days   Time spent: 35 minutes.   Signature  Lala Lund M.D on 07/24/2018 at 10:38 AM   -  To  page go to [email or web address] - password [facility]

## 2018-07-24 NOTE — Progress Notes (Signed)
[facility]  Subjective  - No acute events.   Objective (!) 159/99 64 98 F (36.7 C) (Oral) 14 100% No intake or output data in the 24 hours ending 07/24/18 1012  General: Palpable femoral pulses Palpable R DP Monophasic left DP  Laboratory Lab Results: Recent Labs    07/23/18 0339 07/24/18 0441  WBC 7.6 6.4  HGB 16.0 14.0  HCT 47.6 43.0  PLT 212 189   BMET Recent Labs    07/23/18 0339 07/24/18 0441  NA 138 138  K 3.7 3.7  CL 105 108  CO2 24 22  GLUCOSE 105* 116*  BUN <5* 6*  CREATININE 0.63 0.55*  CALCIUM 9.2 8.8*    COAG Lab Results  Component Value Date   INR 0.87 07/20/2018   No results found for: PTT  Assessment/Planning: 67 yo 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  Monophasic L DP on exam.  Plan for LLE arteriogram today.  M Pending revascularization findings will then need amp - possible toe, TMA vs BKA.  Calf pressure 50's and likely inadequate for even BKA.  Marty Heck 07/24/2018 10:12 AM --

## 2018-07-25 ENCOUNTER — Encounter (HOSPITAL_COMMUNITY): Payer: Self-pay | Admitting: Orthopedic Surgery

## 2018-07-25 ENCOUNTER — Encounter (HOSPITAL_COMMUNITY)
Admission: EM | Disposition: A | Discharge status: Rehabilitation Facility | Payer: Self-pay | Source: Home / Self Care | Attending: Internal Medicine

## 2018-07-25 ENCOUNTER — Inpatient Hospital Stay (HOSPITAL_COMMUNITY): Payer: Medicare Other | Admitting: Certified Registered Nurse Anesthetist

## 2018-07-25 HISTORY — PX: TRANSMETATARSAL AMPUTATION: SHX6197

## 2018-07-25 LAB — GLUCOSE, CAPILLARY
GLUCOSE-CAPILLARY: 191 mg/dL — AB (ref 70–99)
GLUCOSE-CAPILLARY: 137 mg/dL — AB (ref 70–99)
Glucose-Capillary: 113 mg/dL — ABNORMAL HIGH (ref 70–99)
Glucose-Capillary: 126 mg/dL — ABNORMAL HIGH (ref 70–99)
Glucose-Capillary: 124 mg/dL — ABNORMAL HIGH (ref 70–99)
Glucose-Capillary: 129 mg/dL — ABNORMAL HIGH (ref 70–99)

## 2018-07-25 LAB — CBC
HEMATOCRIT: 43.3 % (ref 39.0–52.0)
HEMOGLOBIN: 14.2 g/dL (ref 13.0–17.0)
MCH: 29.3 pg (ref 26.0–34.0)
MCHC: 32.8 g/dL (ref 30.0–36.0)
MCV: 89.3 fL (ref 80.0–100.0)
Platelets: 193 10*3/uL (ref 150–400)
RBC: 4.85 MIL/uL (ref 4.22–5.81)
RDW: 13 % (ref 11.5–15.5)
WBC: 6.8 10*3/uL (ref 4.0–10.5)
nRBC: 0 % (ref 0.0–0.2)

## 2018-07-25 LAB — CULTURE, BLOOD (ROUTINE X 2)
CULTURE: NO GROWTH
CULTURE: NO GROWTH
SPECIAL REQUESTS: ADEQUATE
Special Requests: ADEQUATE

## 2018-07-25 SURGERY — Surgical Case
Anesthesia: *Unknown

## 2018-07-25 SURGERY — AMPUTATION, FOOT, TRANSMETATARSAL
Anesthesia: General | Site: Foot | Laterality: Left

## 2018-07-25 MED ORDER — SODIUM CHLORIDE 0.9 % IV SOLN
INTRAVENOUS | Status: AC
  Administered 2018-07-25: 13:00:00 via INTRAVENOUS

## 2018-07-25 MED ORDER — DEXAMETHASONE SODIUM PHOSPHATE 10 MG/ML IJ SOLN
INTRAMUSCULAR | Status: AC
  Filled 2018-07-25: qty 1

## 2018-07-25 MED ORDER — FENTANYL CITRATE (PF) 250 MCG/5ML IJ SOLN
INTRAMUSCULAR | Status: DC | PRN
  Administered 2018-07-25: 50 ug via INTRAVENOUS

## 2018-07-25 MED ORDER — LIDOCAINE 2% (20 MG/ML) 5 ML SYRINGE
INTRAMUSCULAR | Status: AC
  Filled 2018-07-25: qty 5

## 2018-07-25 MED ORDER — CEFAZOLIN SODIUM-DEXTROSE 2-3 GM-%(50ML) IV SOLR
INTRAVENOUS | Status: DC | PRN
  Administered 2018-07-25: 2 g via INTRAVENOUS

## 2018-07-25 MED ORDER — FENTANYL CITRATE (PF) 250 MCG/5ML IJ SOLN
INTRAMUSCULAR | Status: AC
  Filled 2018-07-25: qty 5

## 2018-07-25 MED ORDER — PROPOFOL 10 MG/ML IV BOLUS
INTRAVENOUS | Status: DC | PRN
  Administered 2018-07-25: 150 mg via INTRAVENOUS

## 2018-07-25 MED ORDER — LIDOCAINE 2% (20 MG/ML) 5 ML SYRINGE
INTRAMUSCULAR | Status: DC | PRN
  Administered 2018-07-25: 100 mg via INTRAVENOUS

## 2018-07-25 MED ORDER — FENTANYL CITRATE (PF) 100 MCG/2ML IJ SOLN
INTRAMUSCULAR | Status: AC
  Filled 2018-07-25: qty 2

## 2018-07-25 MED ORDER — 0.9 % SODIUM CHLORIDE (POUR BTL) OPTIME
TOPICAL | Status: DC | PRN
  Administered 2018-07-25: 1000 mL

## 2018-07-25 MED ORDER — OXYCODONE HCL 5 MG PO TABS
5.0000 mg | ORAL_TABLET | Freq: Once | ORAL | Status: AC | PRN
  Administered 2018-07-25: 5 mg via ORAL

## 2018-07-25 MED ORDER — PROMETHAZINE HCL 25 MG/ML IJ SOLN: 6.2500 mg | INTRAMUSCULAR | Status: DC | PRN

## 2018-07-25 MED ORDER — OXYCODONE HCL 5 MG/5ML PO SOLN: 5.0000 mg | Freq: Once | ORAL | Status: AC | PRN

## 2018-07-25 MED ORDER — MIDAZOLAM HCL 2 MG/2ML IJ SOLN
INTRAMUSCULAR | Status: DC | PRN
  Administered 2018-07-25: 1 mg via INTRAVENOUS

## 2018-07-25 MED ORDER — PROPOFOL 10 MG/ML IV BOLUS
INTRAVENOUS | Status: AC
  Filled 2018-07-25: qty 20

## 2018-07-25 MED ORDER — ONDANSETRON HCL 4 MG/2ML IJ SOLN
INTRAMUSCULAR | Status: AC
  Filled 2018-07-25: qty 2

## 2018-07-25 MED ORDER — ONDANSETRON HCL 4 MG/2ML IJ SOLN
INTRAMUSCULAR | Status: DC | PRN
  Administered 2018-07-25: 4 mg via INTRAVENOUS

## 2018-07-25 MED ORDER — MEPERIDINE HCL 50 MG/ML IJ SOLN: 6.2500 mg | INTRAMUSCULAR | Status: DC | PRN

## 2018-07-25 MED ORDER — FENTANYL CITRATE (PF) 100 MCG/2ML IJ SOLN
25.0000 ug | INTRAMUSCULAR | Status: DC | PRN
  Administered 2018-07-25: 50 ug via INTRAVENOUS

## 2018-07-25 MED ORDER — MIDAZOLAM HCL 2 MG/2ML IJ SOLN
INTRAMUSCULAR | Status: AC
  Filled 2018-07-25: qty 2

## 2018-07-25 MED ORDER — LACTATED RINGERS IV SOLN
INTRAVENOUS | Status: DC
  Administered 2018-07-25: 09:00:00 via INTRAVENOUS

## 2018-07-25 MED ORDER — VANCOMYCIN HCL 10 G IV SOLR
1250.0000 mg | Freq: Two times a day (BID) | INTRAVENOUS | Status: DC
  Administered 2018-07-25 – 2018-08-02 (×16): 1250 mg via INTRAVENOUS
  Filled 2018-07-25 (×19): qty 1250

## 2018-07-25 MED ORDER — OXYCODONE HCL 5 MG PO TABS
ORAL_TABLET | ORAL | Status: AC
  Filled 2018-07-25: qty 1

## 2018-07-25 SURGICAL SUPPLY — 33 items
BANDAGE ACE 4X5 VEL STRL LF (GAUZE/BANDAGES/DRESSINGS) ×3 IMPLANT
BANDAGE ELASTIC 4 VELCRO ST LF (GAUZE/BANDAGES/DRESSINGS) ×3 IMPLANT
BLADE AVERAGE 25MMX9MM (BLADE)
BLADE AVERAGE 25X9 (BLADE) IMPLANT
BNDG GAUZE ELAST 4 BULKY (GAUZE/BANDAGES/DRESSINGS) ×3 IMPLANT
CANISTER SUCT 3000ML PPV (MISCELLANEOUS) ×3 IMPLANT
COVER SURGICAL LIGHT HANDLE (MISCELLANEOUS) ×3 IMPLANT
COVER WAND RF STERILE (DRAPES) ×3 IMPLANT
DRAPE HALF SHEET 40X57 (DRAPES) ×3 IMPLANT
DRAPE ORTHO SPLIT 77X108 STRL (DRAPES) ×2
DRAPE SURG ORHT 6 SPLT 77X108 (DRAPES) ×1 IMPLANT
DRSG ADAPTIC 3X8 NADH LF (GAUZE/BANDAGES/DRESSINGS) ×3 IMPLANT
ELECT REM PT RETURN 9FT ADLT (ELECTROSURGICAL) ×3
ELECTRODE REM PT RTRN 9FT ADLT (ELECTROSURGICAL) ×1 IMPLANT
GAUZE SPONGE 4X4 12PLY STRL (GAUZE/BANDAGES/DRESSINGS) ×3 IMPLANT
GAUZE SPONGE 4X4 12PLY STRL LF (GAUZE/BANDAGES/DRESSINGS) ×3 IMPLANT
GLOVE BIO SURGEON STRL SZ7.5 (GLOVE) ×3 IMPLANT
GLOVE BIOGEL PI IND STRL 8 (GLOVE) ×1 IMPLANT
GLOVE BIOGEL PI INDICATOR 8 (GLOVE) ×2
GOWN STRL REUS W/ TWL LRG LVL3 (GOWN DISPOSABLE) ×1 IMPLANT
GOWN STRL REUS W/ TWL XL LVL3 (GOWN DISPOSABLE) ×1 IMPLANT
GOWN STRL REUS W/TWL LRG LVL3 (GOWN DISPOSABLE) ×2
GOWN STRL REUS W/TWL XL LVL3 (GOWN DISPOSABLE) ×2
KIT BASIN OR (CUSTOM PROCEDURE TRAY) ×3 IMPLANT
KIT TURNOVER KIT B (KITS) ×3 IMPLANT
NS IRRIG 1000ML POUR BTL (IV SOLUTION) ×3 IMPLANT
PACK GENERAL/GYN (CUSTOM PROCEDURE TRAY) ×3 IMPLANT
PAD ARMBOARD 7.5X6 YLW CONV (MISCELLANEOUS) ×6 IMPLANT
SUT ETHILON 3 0 PS 1 (SUTURE) ×3 IMPLANT
TOWEL GREEN STERILE (TOWEL DISPOSABLE) ×6 IMPLANT
TOWEL GREEN STERILE FF (TOWEL DISPOSABLE) ×3 IMPLANT
UNDERPAD 30X30 (UNDERPADS AND DIAPERS) ×3 IMPLANT
WATER STERILE IRR 1000ML POUR (IV SOLUTION) ×3 IMPLANT

## 2018-07-25 NOTE — Progress Notes (Signed)
[facility]  Subjective  - No acute events.  LLE revascularization in cath lab yesterday.  Palpable pulse in left foot now.   Objective (!) 168/88 80 98.6 F (37 C) (Oral) 16 99%  Intake/Output Summary (Last 24 hours) at 07/25/2018 0743 Last data filed at 07/25/2018 0500 Gross per 24 hour  Intake 1978.75 ml  Output 2950 ml  Net -971.25 ml    General: Palpable femoral pulses Palpable R DP Palpable L DP Right groin ok  Laboratory Lab Results: Recent Labs    07/24/18 0441 07/25/18 0342  WBC 6.4 6.8  HGB 14.0 14.2  HCT 43.0 43.3  PLT 189 193   BMET Recent Labs    07/23/18 0339 07/24/18 0441  NA 138 138  K 3.7 3.7  CL 105 108  CO2 24 22  GLUCOSE 105* 116*  BUN <5* 6*  CREATININE 0.63 0.55*  CALCIUM 9.2 8.8*    COAG Lab Results  Component Value Date   INR 0.87 07/20/2018   No results found for: PTT  Assessment/Planning: 67 yo 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  S/P L CIA stent and L SFA revascularization.  Now palpable pulse in left DP.  OR today for left great toe amp vs TMA.  Continue aspirin/Plavix.  Marty Heck 07/25/2018 7:43 AM --

## 2018-07-25 NOTE — Transfer of Care (Signed)
Immediate Anesthesia Transfer of Care Note  Patient: John Laurence Sr.  Procedure(s) Performed: LEFT GREAT TOE AMPUTATION (Left Foot)  Patient Location: PACU  Anesthesia Type:General  Level of Consciousness: drowsy and patient cooperative  Airway & Oxygen Therapy: Patient Spontanous Breathing and Patient connected to face mask oxygen  Post-op Assessment: Report given to RN and Post -op Vital signs reviewed and stable  Post vital signs: Reviewed and stable  Last Vitals:  Vitals Value Taken Time  BP 169/87 07/25/2018 11:37 AM  Temp    Pulse 82 07/25/2018 11:39 AM  Resp 11 07/25/2018 11:39 AM  SpO2 99 % 07/25/2018 11:39 AM  Vitals shown include unvalidated device data.  Last Pain:  Vitals:   07/25/18 0430  TempSrc: Oral  PainSc:       Patients Stated Pain Goal: 3 (03/35/33 1740)  Complications: No apparent anesthesia complications

## 2018-07-25 NOTE — Anesthesia Preprocedure Evaluation (Addendum)
Anesthesia Evaluation  Patient identified by MRN, date of birth, ID band Patient awake    Reviewed: Allergy & Precautions, NPO status , Patient's Chart, lab work & pertinent test results  Airway Mallampati: II  TM Distance: >3 FB Neck ROM: Full    Dental  (+) Dental Advisory Given, Poor Dentition, Missing, Loose   Pulmonary neg pulmonary ROS, Current Smoker,    Pulmonary exam normal breath sounds clear to auscultation       Cardiovascular hypertension, Pt. on medications + Peripheral Vascular Disease  Normal cardiovascular exam+ Valvular Problems/Murmurs  Rhythm:Regular Rate:Normal  Echo 07/24/2018 - Left ventricle: The cavity size was normal. Wall thickness was increased in a pattern of mild LVH. Systolic function was normal. The estimated ejection fraction was in the range of 60% to 65%. Wall motion was normal; there were no regional wall motion abnormalities. Doppler parameters are consistent with abnormal left ventricular relaxation (grade 1 diastolic dysfunction). - Aortic valve: Moderately to severely calcified annulus.   Moderately thickened, moderately calcified leaflets. There was moderate to severe stenosis. Valve area (VTI): 1.06 cm^2. Valve area (Vmean): 1.02 cm^2. - Mitral valve: Mildly calcified annulus.    Neuro/Psych negative neurological ROS  negative psych ROS   GI/Hepatic negative GI ROS, Neg liver ROS,   Endo/Other  diabetes, Poorly Controlled, Type 2  Renal/GU negative Renal ROS     Musculoskeletal  (+) Arthritis ,   Abdominal   Peds  Hematology negative hematology ROS (+)   Anesthesia Other Findings   Reproductive/Obstetrics negative OB ROS                            Anesthesia Physical Anesthesia Plan  ASA: III  Anesthesia Plan: General   Post-op Pain Management:    Induction: Intravenous  PONV Risk Score and Plan: 2 and Ondansetron, Midazolam and Treatment  may vary due to age or medical condition  Airway Management Planned: LMA  Additional Equipment:   Intra-op Plan:   Post-operative Plan: Extubation in OR  Informed Consent: I have reviewed the patients History and Physical, chart, labs and discussed the procedure including the risks, benefits and alternatives for the proposed anesthesia with the patient or authorized representative who has indicated his/her understanding and acceptance.   Dental advisory given  Plan Discussed with: CRNA  Anesthesia Plan Comments:         Anesthesia Quick Evaluation

## 2018-07-25 NOTE — Progress Notes (Addendum)
Inpatient Diabetes Program Recommendations  AACE/ADA: New Consensus Statement on Inpatient Glycemic Control (2015)  Target Ranges:  Prepandial:   less than 140 mg/dL      Peak postprandial:   less than 180 mg/dL (1-2 hours)      Critically ill patients:  140 - 180 mg/dL   Lab Results  Component Value Date   GLUCAP 129 (H) 07/25/2018   HGBA1C 7.4 (H) 07/21/2018    Review of Glycemic ControlResults for [name], John SR. (MRN [id number]) as of 07/25/2018 13:35  Ref. Range 07/24/2018 21:07 07/25/2018 05:55 07/25/2018 09:06 07/25/2018 11:48 07/25/2018 12:42  Glucose-Capillary Latest Ref Range: 70 - 99 mg/dL 157 (H) 113 (H) 126 (H) 124 (H) 129 (H)    Diabetes history: DM 2 Outpatient Diabetes medications:  None Current orders for Inpatient glycemic control:  Novolog sensitive tid with meals  Inpatient Diabetes Program Recommendations:    Blood sugars currently well controlled.  A1C =7.4%.    Thanks,  Adah Perl, RN, BC-ADM Inpatient Diabetes Coordinator Pager [phone]: Briefly spoke with patient regarding DM management. He has not been taking medications or checking his blood sugars.  A1C close to goal however patient has not been following diabetes diet or lifestyle.  He will need f/u with PCP regarding elevated A1C but would not add oral agent at this time due to risk for hypoglycemia.  However he does need to monitor blood sugars and follow closely with PCP.  Will place case management consult for PCP and resources.

## 2018-07-25 NOTE — Progress Notes (Signed)
PROGRESS NOTE    John Sondgeroth Sr.  OYD:[id number] DOB: [birth year] DOA: 07/19/2018 PCP: Patient, No Pcp Per   Brief Narrative: [name]  is a 67 y.o. male, w dm2, who c/o left foot pain x 3 months with tip of 1st digit left foot black and discolored.  Pt apparently presented to ED due to redness on the dorsum of the left foot. Pt notes trauma about 79months ago when he dropped something on his foot.  Pt denies fever, chills, cp, palp, sob, n/v, diarrhea, brbpr.  Pt can't afford diabetic medication but can apparently afford cigarettes.  He came to the hospital he was diagnosed with left first toe gangrene highly suspicious for ischemic injury, vascular surgery was consulted and he was placed on empiric antibiotics along with heparin drip.   Subjective - Patient in bed, appears comfortable, denies any headache, no fever, no chest pain or pressure, no shortness of breath , no abdominal pain. No focal weakness,  Continues to have mild left foot pain.  Assessment & Plan:   1. Left LE  Chronic ischemia, first toe gangrene with some surrounding cellulitis.  Continue with IV antibiotics.  Cultures negative, started on statin, aspirin and Plavix, was also on heparin drip, underwent angiogram on 07/24/2018 by vascular surgery with  left common iliac artery stent placement, left SFA above/knee popliteal artery occlusion balloon angioplasty with DES stent placement.  After surgery proceeding for left foot amputation, AKA versus BKA on 07/25/2018.  2. HTN; PRN hydralazine. Started  norvasc.   3. DM 2; SSI; check HBA1c. 7.4 - DM education ordered.  CBG (last 3)  Recent Labs    07/24/18 2107 07/25/18 0555 07/25/18 0906  GLUCAP 157* 113* 126*     4. Tobacco use; counseling provided.   5. Hypokalemia; replaced & stable.  6.  Loud harsh systolic murmur mostly in the aortic valve area.  Cardiogram shows a preserved EF of 60 to 65% with moderate to severe aortic stenosis, outpatient follow-up  with cardiology and cardiothoracic surgery, hemodynamically he is compensated this is chronic.     DVT prophylaxis: heparin  Code Status:  Full code.  Family Communication: family at bedside.  Disposition Plan: remain inpatient for IV antibiotics and heparin gtt  Consultants:   Vascular.    Procedures:    Angiogram - 07/24/18 -this post left common iliac artery stent placement, left SFA above/knee popliteal artery occlusion balloon angioplasty with DES stent placement.  TTE -  - Left ventricle: The cavity size was normal. Wall thickness was increased in a pattern of mild LVH. Systolic function was normal. The estimated ejection fraction was in the range of 60% to 65%.  Wall motion was normal; there were no regional wall motion abnormalities. Doppler parameters are consistent with abnormal left ventricular relaxation (grade 1 diastolic dysfunction). - Aortic valve: Moderately to severely calcified annulus.  Moderately thickened, moderately calcified leaflets. There was moderate to severe stenosis. Valve area (VTI): 1.06 cm^2. Valve  area (Vmean): 1.02 cm^2. - Mitral valve: Mildly calcified annulus.    Antimicrobials:   Vancomycin  cefepime   Objective:  Blood pressure (!) 159/99, pulse 64, temperature 98 F (36.7 C), temperature source Oral, resp. rate 14, height 5\' 11"  (1.803 m), weight 68.4 kg, SpO2 100 %.  Examination:  Awake Alert, Oriented X 3, No new F.N deficits, Normal affect [place].AT,PERRAL Supple Neck,No JVD, No cervical lymphadenopathy appriciated.  Symmetrical Chest wall movement, Good air movement bilaterally, CTAB RRR,No Gallops, Rubs, positive aortic systolic murmur, No  Parasternal Heave +ve B.Sounds, Abd Soft, No tenderness, No organomegaly appriciated, No rebound - guarding or rigidity. No Cyanosis, left foot and more so left first toe severely cyanotic, dry gangrenous ulcer at the tip of the first toe.   Data Reviewed: I have personally reviewed  following labs and imaging studies  CBC: Recent Labs  Lab 07/20/18 0039 07/21/18 0715 07/22/18 0430 07/23/18 0339 07/24/18 0441 07/25/18 0342  WBC 8.7 7.3 7.0 7.6 6.4 6.8  NEUTROABS 5.3  --   --   --   --   --   HGB 15.8 14.1 14.7 16.0 14.0 14.2  HCT 46.8 42.2 43.8 47.6 43.0 43.3  MCV 90.9 91.1 89.6 89.3 89.4 89.3  PLT 198 186 180 212 189 948   Basic Metabolic Panel: Recent Labs  Lab 07/20/18 1832 07/21/18 0715 07/22/18 0430 07/23/18 0339 07/24/18 0441  NA 140 140 140 138 138  K 3.6 3.6 3.3* 3.7 3.7  CL 109 110 108 105 108  CO2 22 23 26 24 22   GLUCOSE 100* 151* 151* 105* 116*  BUN 5* 10 6* <5* 6*  CREATININE 0.63 0.74 0.55* 0.63 0.55*  CALCIUM 8.6* 8.5* 8.7* 9.2 8.8*  MG  --   --   --  1.9  --    GFR: Estimated Creatinine Clearance: 84.3 mL/min (A) (by C-G formula based on SCr of 0.55 mg/dL (L)). Liver Function Tests: Recent Labs  Lab 07/20/18 0039  AST 39  ALT 38  ALKPHOS 85  BILITOT 0.6  PROT 7.3  ALBUMIN 3.6   No results for input(s): LIPASE, AMYLASE in the last 168 hours. No results for input(s): AMMONIA in the last 168 hours. Coagulation Profile: Recent Labs  Lab 07/20/18 0039  INR 0.87   Cardiac Enzymes: No results for input(s): CKTOTAL, CKMB, CKMBINDEX, TROPONINI in the last 168 hours. BNP (last 3 results) No results for input(s): PROBNP in the last 8760 hours. HbA1C: No results for input(s): HGBA1C in the last 72 hours. CBG: Recent Labs  Lab 07/24/18 1329 07/24/18 1613 07/24/18 2107 07/25/18 0555 07/25/18 0906  GLUCAP 116* 133* 157* 113* 126*   Lipid Profile: Recent Labs    07/24/18 0441  CHOL 103  HDL 40*  LDLCALC 52  TRIG 53  CHOLHDL 2.6   Thyroid Function Tests: No results for input(s): TSH, T4TOTAL, FREET4, T3FREE, THYROIDAB in the last 72 hours. Anemia Panel: No results for input(s): VITAMINB12, FOLATE, FERRITIN, TIBC, IRON, RETICCTPCT in the last 72 hours. Sepsis Labs: Recent Labs  Lab 07/20/18 0051 07/20/18 0213  07/20/18 1832 07/20/18 2121  LATICACIDVEN 2.09* 2.44* 1.2 1.0    Recent Results (from the past 240 hour(s))  Blood Culture (routine x 2)     Status: None   Collection Time: 07/20/18 12:39 AM  Result Value Ref Range Status   Specimen Description RIGHT ANTECUBITAL  Final   Special Requests   Final    BOTTLES DRAWN AEROBIC AND ANAEROBIC Blood Culture adequate volume   Culture   Final    NO GROWTH 5 DAYS Performed at [facility], [address] [place], [place] [address]    Report Status 07/25/2018 FINAL  Final  Blood Culture (routine x 2)     Status: None   Collection Time: 07/20/18 12:42 AM  Result Value Ref Range Status   Specimen Description RIGHT ANTECUBITAL  Final   Special Requests   Final    BOTTLES DRAWN AEROBIC AND ANAEROBIC Blood Culture adequate volume   Culture   Final  NO GROWTH 5 DAYS Performed at [facility], [address], [place], [place] [address]    Report Status 07/25/2018 FINAL  Final     Radiology Studies: No results found.  Scheduled Meds: . [MAR Hold] amLODipine  5 mg Oral Daily  . [MAR Hold] aspirin EC  81 mg Oral Daily  . [MAR Hold] atorvastatin  20 mg Oral q1800  . [MAR Hold] clopidogrel  75 mg Oral Q breakfast  . [MAR Hold] Influenza vac split quadrivalent PF  0.5 mL Intramuscular Tomorrow-1000  . [MAR Hold] insulin aspart  0-9 Units Subcutaneous TID WC  . [MAR Hold] nicotine  14 mg Transdermal Daily  . [MAR Hold] pneumococcal 23 valent vaccine  0.5 mL Intramuscular Tomorrow-1000  . [MAR Hold] sodium chloride flush  3 mL Intravenous Q12H   Continuous Infusions: . [MAR Hold] sodium chloride 250 mL (07/25/18 0459)  . sodium chloride    . [MAR Hold] ceFEPime (MAXIPIME) IV 2 g (07/25/18 0450)  . [MAR Hold] vancomycin 1,250 mg (07/24/18 2354)     LOS: 4 days   Time spent: 35 minutes.   Signature  Lala Lund M.D on 07/25/2018 at 11:14 AM   -  To page go to [email or web address] - password [email or web address]

## 2018-07-25 NOTE — Anesthesia Postprocedure Evaluation (Deleted)
Anesthesia Post Note  Patient: John Laurence Sr.  Procedure(s) Performed: LEFT GREAT TOE AMPUTATION (Left Foot)     Patient location during evaluation: PACU Anesthesia Type: General Level of consciousness: sedated and patient cooperative Pain management: pain level controlled Vital Signs Assessment: post-procedure vital signs reviewed and stable Respiratory status: spontaneous breathing Cardiovascular status: stable Anesthetic complications: no    Last Vitals:  Vitals:   07/24/18 1944 07/25/18 0430  BP: 134/79 (!) 168/88  Pulse: 82 80  Resp: (!) 23 16  Temp: 37.2 C 37 C  SpO2: 97% 99%    Last Pain:  Vitals:   07/25/18 0430  TempSrc: Oral  PainSc:                  Nolon Nations

## 2018-07-25 NOTE — Anesthesia Procedure Notes (Signed)
Procedure Name: LMA Insertion Date/Time: 07/25/2018 10:55 AM Performed by: Bryson Corona, CRNA Pre-anesthesia Checklist: Patient identified, Emergency Drugs available, Suction available and Patient being monitored Patient Re-evaluated:Patient Re-evaluated prior to induction Oxygen Delivery Method: Circle System Utilized Preoxygenation: Pre-oxygenation with 100% oxygen Induction Type: IV induction Ventilation: Mask ventilation without difficulty LMA: LMA inserted LMA Size: 5.0 Number of attempts: 1 Placement Confirmation: positive ETCO2 Tube secured with: Tape Dental Injury: Teeth and Oropharynx as per pre-operative assessment

## 2018-07-25 NOTE — Progress Notes (Signed)
PT Cancellation Note  Patient Details Name: John Poitras Sr. MRN: [id number] DOB: 14-Apr-1951   Cancelled Treatment:    Reason Eval/Treat Not Completed: Other (comment)(order received with pt POD#0 for ray amp. Await orders for weight bearing or any post op shoe prior to mobility)   Rayleen Wyrick B Suprina [place] 07/25/2018, 1:23 PM  Maryjayne Kleven Pam Drown, PT Acute Rehabilitation Services Pager: [phone] Office: [phone]

## 2018-07-25 NOTE — Op Note (Signed)
Date: July 25, 2018  Preoperative diagnosis: Critical limb ischemia of the left lower extremity with tissue loss  Postoperative diagnosis: Same  Procedure: Left great toe amputation (ray amputation)  Surgeon: Dr. Marty Heck, MD  Indications: Patient is a 67 year old male who was admitted over the weekend with severe tissue loss in his left lower extremity with underlying critical limb ischemia.  He underwent revascularization yesterday with left iliac and SFA stenting.  He presents today for a left lower extremity toe amp after risks and benefits were discussed.  Findings: Amputation of the left great toe with removal of a portion of the head of the metatarsal head.  This is a high risk wound that that the family understands but we will see if it makes any progress toward healing.  Complications: None  Details: Patient was taken to the operating room after informed consent was obtained.  He was placed on operative table in supine position.  His left leg was prepped and draped in standard sterile fashion.  A tennis racquet incision was then made at the base of the left great toe with a 15 blade scalpel.  Using blunt dissection and Metzenbaum scissors the great toe was then transected from the head of the metatarsal.  There was some oozing here which was encouraging.  We then took the head of the metatarsal with a rongeur and passed the specimen off the field.  The wound was irrigated.  I placed several loose nylons to close the tissue.  Taken to PACU in stable condition.  Sterile dry dressing applied.  Marty Heck, MD [facility] Office: [phone] Pager: [place]

## 2018-07-25 NOTE — Progress Notes (Addendum)
3:30 pm Surgical site bled through dressing, gauze and ace bandage changed. Will continue to monitor.   6 pm Surgical site dressing changed again

## 2018-07-26 ENCOUNTER — Encounter (HOSPITAL_COMMUNITY): Payer: Self-pay | Admitting: Vascular Surgery

## 2018-07-26 LAB — BASIC METABOLIC PANEL
Anion gap: 6 (ref 5–15)
BUN: 7 mg/dL — AB (ref 8–23)
CHLORIDE: 101 mmol/L (ref 98–111)
CO2: 27 mmol/L (ref 22–32)
CREATININE: 0.75 mg/dL (ref 0.61–1.24)
Calcium: 9.1 mg/dL (ref 8.9–10.3)
GFR calc Af Amer: >60 mL/min (ref >60)
GFR calc non Af Amer: >60 mL/min (ref >60)
Glucose, Bld: 123 mg/dL — ABNORMAL HIGH (ref 70–99)
Potassium: 3.4 mmol/L — ABNORMAL LOW (ref 3.5–5.1)
Sodium: 134 mmol/L — ABNORMAL LOW (ref 135–145)

## 2018-07-26 LAB — CBC
HEMATOCRIT: 43.9 % (ref 39.0–52.0)
HEMOGLOBIN: 14.6 g/dL (ref 13.0–17.0)
MCH: 30 pg (ref 26.0–34.0)
MCHC: 33.3 g/dL (ref 30.0–36.0)
MCV: 90.1 fL (ref 80.0–100.0)
Platelets: 216 10*3/uL (ref 150–400)
RBC: 4.87 MIL/uL (ref 4.22–5.81)
RDW: 13.1 % (ref 11.5–15.5)
WBC: 10 10*3/uL (ref 4.0–10.5)
nRBC: 0 % (ref 0.0–0.2)

## 2018-07-26 LAB — GLUCOSE, CAPILLARY
GLUCOSE-CAPILLARY: 155 mg/dL — AB (ref 70–99)
GLUCOSE-CAPILLARY: 98 mg/dL (ref 70–99)
Glucose-Capillary: 124 mg/dL — ABNORMAL HIGH (ref 70–99)
Glucose-Capillary: 180 mg/dL — ABNORMAL HIGH (ref 70–99)

## 2018-07-26 LAB — MAGNESIUM: Magnesium: 1.8 mg/dL (ref 1.7–2.4)

## 2018-07-26 LAB — VANCOMYCIN, TROUGH: VANCOMYCIN TR: 12 ug/mL — AB (ref 15–20)

## 2018-07-26 MED ORDER — HYDRALAZINE HCL 25 MG PO TABS
25.0000 mg | ORAL_TABLET | Freq: Three times a day (TID) | ORAL | Status: DC
  Administered 2018-07-26 – 2018-07-27 (×4): 25 mg via ORAL
  Filled 2018-07-26 (×4): qty 1

## 2018-07-26 MED ORDER — CARVEDILOL 6.25 MG PO TABS
6.2500 mg | ORAL_TABLET | Freq: Two times a day (BID) | ORAL | Status: DC
  Administered 2018-07-26 – 2018-08-05 (×21): 6.25 mg via ORAL
  Filled 2018-07-26 (×21): qty 1

## 2018-07-26 MED ORDER — AMLODIPINE BESYLATE 10 MG PO TABS
10.0000 mg | ORAL_TABLET | Freq: Every day | ORAL | Status: DC
  Administered 2018-07-26 – 2018-08-05 (×11): 10 mg via ORAL
  Filled 2018-07-26 (×11): qty 1

## 2018-07-26 NOTE — Progress Notes (Addendum)
Pharmacy Antibiotic Note  John Pennypacker Sr. is a 67 y.o. male admitted on 07/19/2018 with cellulitis / dry gangrene.  Pharmacy has been consulted for vancomycin and cefepime dosing - day #7. S/p LLE revascularization 10/30, L great toe amputation 10/31. Renal function stable.  Plan: Vancomycin 1,250 mg IV every 12 hrs. Goal vancomycin trough 10-27mcg/mL  Cefepime 2g IV every 12 hours. Monitor clinical picture, renal function, vancomycin trough at new SS at 1230 today  ADDENDUM:  Vancomycin trough therapeutic at 12. No changes needed.  Height: 5' 10.98" (180.3 cm) Weight: 152 lb 12.5 oz (69.3 kg) IBW/kg (Calculated) : 75.26  Temp (24hrs), Avg:98.6 F (37 C), Min:97.3 F (36.3 C), Max:100.2 F (37.9 C)  Recent Labs  Lab 07/20/18 0051 07/20/18 0213 07/20/18 1832 07/20/18 2121 07/21/18 0715 07/22/18 0430 07/23/18 0339 07/23/18 1653 07/24/18 0441 07/25/18 0342 07/26/18 0606  WBC  --   --   --   --  7.3 7.0 7.6  --  6.4 6.8 10.0  CREATININE  --   --  0.63  --  0.74 0.55* 0.63  --  0.55*  --  0.75  LATICACIDVEN 2.09* 2.44* 1.2 1.0  --   --   --   --   --   --   --   VANCOTROUGH  --   --   --   --   --   --   --  10*  --   --   --     Estimated Creatinine Clearance: 89 mL/min (by C-G formula based on SCr of 0.75 mg/dL).    Allergies  Allergen Reactions  . Tea Itching and Rash   Vanco 10/26 >>    10/29 VT = 10 (drawn early, change dose to 1,250mg  Q12 and start maintenance earlier)     11/1 VT = 12 Cefepime 10/26 >>  10/26 Bcx: neg  Elicia Lamp, PharmD, BCPS Clinical Pharmacist Clinical phone [phone] Please check AMION for all [place] contact numbers 07/26/2018 10:35 AM

## 2018-07-26 NOTE — Anesthesia Postprocedure Evaluation (Signed)
Anesthesia Post Note  Patient: John Laurence Sr.  Procedure(s) Performed: LEFT GREAT TOE AMPUTATION (Left Foot)     Patient location during evaluation: PACU Anesthesia Type: General Level of consciousness: sedated and patient cooperative Pain management: pain level controlled Vital Signs Assessment: post-procedure vital signs reviewed and stable Respiratory status: spontaneous breathing Cardiovascular status: stable Anesthetic complications: no    Last Vitals:  Vitals:   07/26/18 1140 07/26/18 1420  BP: 110/70 106/89  Pulse: 82   Resp: (!) 33   Temp: 37.2 C   SpO2:      Last Pain:  Vitals:   07/26/18 1140  TempSrc: Oral  PainSc:                  Nolon Nations

## 2018-07-26 NOTE — Evaluation (Signed)
Occupational Therapy Evaluation Patient Details Name: John Berent Sr. MRN: [id number] DOB: 12-01-1950 Today's Date: 07/26/2018    History of Present Illness 67 yo admitted with necrotic left toe s/p revascularization 10/30 and ray amputation 10/31. PMhx: HTN, DM, arthritis   Clinical Impression   Pt admitted with the above diagnoses and presents with below problem list. Pt will benefit from continued acute OT to address the below listed deficits and maximize independence with basic ADLs prior to d/c home. PTA pt was mod I with ADLs. Pt is currently min A with LB ADLs, functional mobility, and toilet transfers. Pt noted to have some unsteadiness during in-room ambulation. Noted 2-3 LOB needing min A to steady. Pt did endorse some dizziness which appeared to resolve with time.       Follow Up Recommendations  Home health OT;Supervision/Assistance - 24 hour    Equipment Recommendations  3 in 1 bedside commode    Recommendations for Other Services       Precautions / Restrictions Precautions Precautions: Fall Restrictions Weight Bearing Restrictions: Yes LLE Weight Bearing: Weight bearing as tolerated Other Position/Activity Restrictions: WBAT in post op shoe      Mobility Bed Mobility               General bed mobility comments: up in chair  Transfers Overall transfer level: Needs assistance Equipment used: Straight cane;Rolling walker (2 wheeled) Transfers: Sit to/from Stand Sit to Stand: Min guard;Min assist         General transfer comment: pt reporting some dizziness on initial stand, Instructed to wait for dizziness to subside prior to further ambulation.    Balance Overall balance assessment: Needs assistance         Standing balance support: Single extremity supported;Bilateral upper extremity supported Standing balance-Leahy Scale: Poor Standing balance comment: stood at toilet to urinate with close min guard, 2-3 LOB during ambulation with cane  needing min A to correct                           ADL either performed or assessed with clinical judgement   ADL Overall ADL's : Needs assistance/impaired Eating/Feeding: Set up;Sitting   Grooming: Minimal assistance;Standing   Upper Body Bathing: Set up;Sitting   Lower Body Bathing: Minimal assistance   Upper Body Dressing : Set up;Sitting   Lower Body Dressing: Minimal assistance   Toilet Transfer: Minimal assistance   Toileting- Clothing Manipulation and Hygiene: Minimal assistance   Tub/ Shower Transfer: Minimal assistance   Functional mobility during ADLs: Minimal assistance;Cane;Rolling walker;Min guard General ADL Comments: Pt completed mobility to/from bathroom. Pt with 2-3 LOB during ambulation in the room needing min A to steady, 1x mod A. Pt requested to try rw and finished walking back to chair min guard.     Vision         Perception     Praxis      Pertinent Vitals/Pain Pain Assessment: Faces Faces Pain Scale: Hurts even more Pain Location: L foot Pain Descriptors / Indicators: Tingling Pain Intervention(s): Monitored during session;Repositioned;Limited activity within patient's tolerance     Hand Dominance     Extremity/Trunk Assessment Upper Extremity Assessment Upper Extremity Assessment: Overall WFL for tasks assessed   Lower Extremity Assessment Lower Extremity Assessment: Defer to PT evaluation       Communication Communication Communication: No difficulties   Cognition Arousal/Alertness: Awake/alert Behavior During Therapy: WFL for tasks assessed/performed Overall Cognitive Status: Within Functional Limits for tasks  assessed                                     General Comments       Exercises     Shoulder Instructions      Home Living Family/patient expects to be discharged to:: Private residence Living Arrangements: Spouse/significant other Available Help at Discharge: Available  PRN/intermittently Type of Home: House Home Access: Stairs to enter [employer] of Steps: 5 Entrance Stairs-Rails: Can reach both Home Layout: One level     Bathroom Shower/Tub: Tub only   Biochemist, clinical: Standard Bathroom Accessibility: Yes How Accessible: Accessible via walker Home Equipment: Cane - single point          Prior Functioning/Environment Level of Independence: Independent with assistive device(s)        Comments: tub only no shower so he does sponge baths        OT Problem List: Decreased activity tolerance;Impaired balance (sitting and/or standing);Decreased knowledge of use of DME or AE;Decreased knowledge of precautions;Pain      OT Treatment/Interventions: Self-care/ADL training;DME and/or AE instruction;Therapeutic activities;Balance training;Patient/family education    OT Goals(Current goals can be found in the care plan section) Acute Rehab OT Goals Patient Stated Goal: none stated OT Goal Formulation: With patient Time For Goal Achievement: 08/02/18 Potential to Achieve Goals: Good ADL Goals Pt Will Perform Lower Body Bathing: with modified independence;sit to/from stand Pt Will Perform Lower Body Dressing: with modified independence;sit to/from stand Pt Will Transfer to Toilet: with modified independence;ambulating Pt Will Perform Toileting - Clothing Manipulation and hygiene: with modified independence;sit to/from stand  OT Frequency: Min 2X/week   Barriers to D/C:            Co-evaluation              AM-PAC PT "6 Clicks" Daily Activity     Outcome Measure Help from another person eating meals?: None Help from another person taking care of personal grooming?: None Help from another person toileting, which includes using toliet, bedpan, or urinal?: A Little Help from another person bathing (including washing, rinsing, drying)?: A Little Help from another person to put on and taking off regular upper body clothing?:  None Help from another person to put on and taking off regular lower body clothing?: A Little 6 Click Score: 21   End of Session Equipment Utilized During Treatment: Gait belt;Rolling walker;Other (comment)(cane, postop shoe) Nurse Communication: Mobility status;Other (comment)(dizziness at begining of mobility)  Activity Tolerance: Patient tolerated treatment well;Other (comment)(dizziness upon standing) Patient left: in chair;with call bell/phone within reach  OT Visit Diagnosis: Unsteadiness on feet (R26.81);Pain;Other abnormalities of gait and mobility (R26.89) Pain - Right/Left: Left Pain - part of body: Ankle and joints of foot                Time: 6294-7654 OT Time Calculation (min): 14 min Charges:  OT General Charges $OT Visit: 1 Visit OT Evaluation $OT Eval Low Complexity: [place], OT Acute Rehabilitation Services Pager: [phone] Office: [phone]   Hortencia Pilar 07/26/2018, 2:16 PM

## 2018-07-26 NOTE — Progress Notes (Addendum)
[facility]   Subjective  - Doing well with decrease pain in the left foot   Objective (!) 166/80 (!) 102 99.1 F (37.3 C) (Oral) 17 98%  Intake/Output Summary (Last 24 hours) at 07/26/2018 0746 Last data filed at 07/26/2018 0410 Gross per 24 hour  Intake 750 ml  Output 765 ml  Net -15 ml    Left foot dressing clean and dry Left leg warm to touch above dressing with intact active range of motion. Palpable DP B pulses    Assessment/Planning: S/P L CIA stent and L SFA revascularization. POD # 1 Left great toe amputation (ray amputation)  Plan to change his dressing tomorrow.  There is risk of him needing further amputation pending the healing of this amputation wound.  We will watch it over the weekend.  Heel weight bearing in hard sole shoe. Continue Plavix and he was started on Lipitor 20 mg, Norvasc this admission.      Roxy Horseman 07/26/2018 7:46 AM --  Laboratory Lab Results: Recent Labs    07/25/18 0342 07/26/18 0606  WBC 6.8 10.0  HGB 14.2 14.6  HCT 43.3 43.9  PLT 193 216   BMET Recent Labs    07/24/18 0441 07/26/18 0606  NA 138 134*  K 3.7 3.4*  CL 108 101  CO2 22 27  GLUCOSE 116* 123*  BUN 6* 7*  CREATININE 0.55* 0.75  CALCIUM 8.8* 9.1    COAG Lab Results  Component Value Date   INR 0.87 07/20/2018   No results found for: PTT  I have seen and evaluated the patient. I agree with the PA note as documented above. L DP palpable after revascularization.  L ray amp of great toe yesterday.  Will remove dressing tomorrow.  Discussed with family high risk for failure given appearance of tissue of dorsum of foot, but reasonable to try.  Will need higher level amp if this fails.  Marty Heck, MD [facility] Office: [phone] Pager: [phone]

## 2018-07-26 NOTE — Progress Notes (Signed)
PROGRESS NOTE    John Caselli Sr.  GMW:[id number] DOB: 11/02/1950 DOA: 07/19/2018 PCP: Patient, No Pcp Per   Brief Narrative: Juergen Hardenbrook  is a 67 y.o. male, w dm2, who c/o left foot pain x 3 months with tip of 1st digit left foot black and discolored.  Pt apparently presented to ED due to redness on the dorsum of the left foot. Pt notes trauma about 7months ago when he dropped something on his foot.  Pt denies fever, chills, cp, palp, sob, n/v, diarrhea, brbpr.  Pt can't afford diabetic medication but can apparently afford cigarettes.  He came to the hospital he was diagnosed with left first toe gangrene highly suspicious for ischemic injury, vascular surgery was consulted and he was placed on empiric antibiotics along with heparin drip.   Subjective - Patient in bed, appears comfortable, denies any headache, no fever, no chest pain or pressure, no shortness of breath , no abdominal pain. No focal weakness, mild left foot pain.  Assessment & Plan:   1. Left LE  Chronic ischemia, first toe gangrene with some surrounding cellulitis.  Continue with IV antibiotics.  Cultures negative, started on statin, aspirin and Plavix, was also on heparin drip, underwent angiogram on 07/24/2018 by vascular surgery with  left common iliac artery stent placement, left SFA above/knee popliteal artery occlusion balloon angioplasty with DES stent placement, he has subsequently undergone left foot first toe ray amputation on 07/25/2018, still has surrounding cellulitis hence antibiotics will be continued, had a low-grade fever morning of 07/26/2018, he still may end up with left BKA, will defer this to vascular surgery and monitor closely.  Discussed with vascular surgeon Dr. Monica Martinez on 07/26/2018.  2. HTN; PRN hydralazine. Started  norvasc.   3. DM 2; SSI; check HBA1c. 7.4 - DM education ordered.  CBG (last 3)  Recent Labs    07/25/18 1721 07/25/18 2130 07/26/18 0632  GLUCAP 191* 137* 124*      4. Tobacco use; counseling provided.   5. Hypokalemia; replaced & stable.  6.  Loud harsh systolic murmur mostly in the aortic valve area.  Echocardiogram noted which shows a preserved EF of 60 to 65% with moderate to severe aortic stenosis, outpatient follow-up with cardiology and cardiothoracic surgery, hemodynamically he is compensated this is chronic.     DVT prophylaxis: heparin  Code Status:  Full code.  Family Communication: family at bedside.  Disposition Plan: remain inpatient for IV antibiotics and heparin gtt  Consultants:   Vascular.    Procedures:   Underwent left foot first toe ray amputation on 08/24/2018.    Angiogram - 07/24/18 -this post left common iliac artery stent placement, left SFA above/knee popliteal artery occlusion balloon angioplasty with DES stent placement.   TTE -  - Left ventricle: The cavity size was normal. Wall thickness was increased in a pattern of mild LVH. Systolic function was normal. The estimated ejection fraction was in the range of 60% to 65%.  Wall motion was normal; there were no regional wall motion abnormalities. Doppler parameters are consistent with abnormal left ventricular relaxation (grade 1 diastolic dysfunction). - Aortic valve: Moderately to severely calcified annulus.  Moderately thickened, moderately calcified leaflets. There was moderate to severe stenosis. Valve area (VTI): 1.06 cm^2. Valve  area (Vmean): 1.02 cm^2. - Mitral valve: Mildly calcified annulus.    Antimicrobials:   Vancomycin  cefepime   Objective:  Blood pressure (!) 159/99, pulse 64, temperature 98 F (36.7 C), temperature source Oral, resp. rate  14, height 5\' 11"  (1.803 m), weight 68.4 kg, SpO2 100 %.  Examination:  Awake Alert, Oriented X 3, No new F.N deficits, Normal affect [place].AT,PERRAL Supple Neck,No JVD, No cervical lymphadenopathy appriciated.  Symmetrical Chest wall movement, Good air movement bilaterally, CTAB RRR,No  Gallops, Rubs or new Murmurs, No Parasternal Heave +ve B.Sounds, Abd Soft, No tenderness, No organomegaly appriciated, No rebound - guarding or rigidity. No Cyanosis, Clubbing or edema,  left foot post 1st toe ray amputation under bandage with some surrounding cellulitis.   Data Reviewed: I have personally reviewed following labs and imaging studies  CBC:  Recent Labs  Lab 07/20/18 0039  07/22/18 0430 07/23/18 0339 07/24/18 0441 07/25/18 0342 07/26/18 0606  WBC 8.7   < > 7.0 7.6 6.4 6.8 10.0  NEUTROABS 5.3  --   --   --   --   --   --   HGB 15.8   < > 14.7 16.0 14.0 14.2 14.6  HCT 46.8   < > 43.8 47.6 43.0 43.3 43.9  MCV 90.9   < > 89.6 89.3 89.4 89.3 90.1  PLT 198   < > 180 212 189 193 216   < > = values in this interval not displayed.   Basic Metabolic Panel:  Recent Labs  Lab 07/21/18 0715 07/22/18 0430 07/23/18 0339 07/24/18 0441 07/26/18 0606  NA 140 140 138 138 134*  K 3.6 3.3* 3.7 3.7 3.4*  CL 110 108 105 108 101  CO2 23 26 24 22 27   GLUCOSE 151* 151* 105* 116* 123*  BUN 10 6* <5* 6* 7*  CREATININE 0.74 0.55* 0.63 0.55* 0.75  CALCIUM 8.5* 8.7* 9.2 8.8* 9.1  MG  --   --  1.9  --  1.8   GFR: Estimated Creatinine Clearance: 89 mL/min (by C-G formula based on SCr of 0.75 mg/dL). Liver Function Tests: Recent Labs  Lab 07/20/18 0039  AST 39  ALT 38  ALKPHOS 85  BILITOT 0.6  PROT 7.3  ALBUMIN 3.6   No results for input(s): LIPASE, AMYLASE in the last 168 hours. No results for input(s): AMMONIA in the last 168 hours. Coagulation Profile: Recent Labs  Lab 07/20/18 0039  INR 0.87   Cardiac Enzymes: No results for input(s): CKTOTAL, CKMB, CKMBINDEX, TROPONINI in the last 168 hours. BNP (last 3 results) No results for input(s): PROBNP in the last 8760 hours. HbA1C: No results for input(s): HGBA1C in the last 72 hours. CBG: Recent Labs  Lab 07/25/18 1148 07/25/18 1242 07/25/18 1721 07/25/18 2130 07/26/18 0632  GLUCAP 124* 129* 191* 137* 124*    Lipid Profile: Recent Labs    07/24/18 0441  CHOL 103  HDL 40*  LDLCALC 52  TRIG 53  CHOLHDL 2.6   Thyroid Function Tests: No results for input(s): TSH, T4TOTAL, FREET4, T3FREE, THYROIDAB in the last 72 hours. Anemia Panel: No results for input(s): VITAMINB12, FOLATE, FERRITIN, TIBC, IRON, RETICCTPCT in the last 72 hours. Sepsis Labs: Recent Labs  Lab 07/20/18 0051 07/20/18 0213 07/20/18 1832 07/20/18 2121  LATICACIDVEN 2.09* 2.44* 1.2 1.0    Recent Results (from the past 240 hour(s))  Blood Culture (routine x 2)     Status: None   Collection Time: 07/20/18 12:39 AM  Result Value Ref Range Status   Specimen Description RIGHT ANTECUBITAL  Final   Special Requests   Final    BOTTLES DRAWN AEROBIC AND ANAEROBIC Blood Culture adequate volume   Culture   Final    NO GROWTH  5 DAYS Performed at [facility], [address], [place], [place] [address]    Report Status 07/25/2018 FINAL  Final  Blood Culture (routine x 2)     Status: None   Collection Time: 07/20/18 12:42 AM  Result Value Ref Range Status   Specimen Description RIGHT ANTECUBITAL  Final   Special Requests   Final    BOTTLES DRAWN AEROBIC AND ANAEROBIC Blood Culture adequate volume   Culture   Final    NO GROWTH 5 DAYS Performed at [facility], [address] [place], [place] [address]    Report Status 07/25/2018 FINAL  Final     Radiology Studies:  No results found.  Scheduled Meds: . amLODipine  10 mg Oral Daily  . aspirin EC  81 mg Oral Daily  . atorvastatin  20 mg Oral q1800  . carvedilol  6.25 mg Oral BID WC  . clopidogrel  75 mg Oral Q breakfast  . hydrALAZINE  25 mg Oral Q8H  . Influenza vac split quadrivalent PF  0.5 mL Intramuscular Tomorrow-1000  . insulin aspart  0-9 Units Subcutaneous TID WC  . nicotine  14 mg Transdermal Daily  . sodium chloride flush  3 mL Intravenous Q12H   Continuous Infusions: . sodium chloride 10 mL/hr at 07/26/18 0117  . ceFEPime (MAXIPIME) IV 2 g  (07/26/18 0410)  . vancomycin 1,250 mg (07/26/18 0119)     LOS: 5 days   Time spent: 35 minutes.   Signature  Lala Lund M.D on 07/26/2018 at 10:16 AM   -  To page go to [email or web address] - password [facility]

## 2018-07-26 NOTE — Evaluation (Signed)
Physical Therapy Evaluation Patient Details Name: [name]. MRN: [id number] DOB: 06-13-1951 Today's Date: 07/26/2018   History of Present Illness  67 yo admitted with necrotic left toe s/p revascularization 10/30 and ray amputation 10/31. PMhx: HTN, DM, arthritis  Clinical Impression  Pt presenting with decreased functional mobility 2/2 pain in LLE from ray amputation on 10/31.  Able to mobilize in room with min guard overall, improved balance and decreased need for assist with practice.  Would benefit from acute PT while admitted to progress functional mobility and safety prior to d/c home.      Follow Up Recommendations Home health PT    Equipment Recommendations  None recommended by PT    Recommendations for Other Services       Precautions / Restrictions Restrictions Weight Bearing Restrictions: Yes LLE Weight Bearing: Weight bearing as tolerated Other Position/Activity Restrictions: WBAT in post op shoe      Mobility  Bed Mobility Overal bed mobility: Independent                Transfers Overall transfer level: Needs assistance Equipment used: Straight cane Transfers: Sit to/from Stand Sit to Stand: Min guard            Ambulation/Gait Ambulation/Gait assistance: Min guard Gait Distance (Feet): 20 Feet Assistive device: Straight cane Gait Pattern/deviations: Step-to pattern;Antalgic     General Gait Details: cues for weight bearing through heel, pt gaining steadiness in gait with practice  Stairs            Wheelchair Mobility    Modified Rankin (Stroke Patients Only)       Balance Overall balance assessment: Mild deficits observed, not formally tested                                           Pertinent Vitals/Pain Pain Assessment: 0-10 Pain Score: 8  Pain Location: L foot Pain Descriptors / Indicators: Aching Pain Intervention(s): Limited activity within patient's tolerance;Monitored during session     Home Living Family/patient expects to be discharged to:: Private residence Living Arrangements: Spouse/significant other Available Help at Discharge: Available PRN/intermittently Type of Home: House Home Access: Stairs to enter Entrance Stairs-Rails: Can reach both Entrance Stairs-Number of Steps: 5 Home Layout: One level Home Equipment: Cane - single point      Prior Function Level of Independence: Independent with assistive device(s)               Hand Dominance        Extremity/Trunk Assessment        Lower Extremity Assessment Lower Extremity Assessment: Overall WFL for tasks assessed(5/5 at hip and knee bilaterally, 5/5 in R ankle)       Communication   Communication: No difficulties  Cognition Arousal/Alertness: Awake/alert Behavior During Therapy: WFL for tasks assessed/performed Overall Cognitive Status: Within Functional Limits for tasks assessed                                        General Comments      Exercises     Assessment/Plan    PT Assessment Patient needs continued PT services  PT Problem List Decreased balance;Decreased mobility       PT Treatment Interventions Gait training;DME instruction;Stair training;Balance training    PT Goals (Current goals can  be found in the Care Plan section)  Acute Rehab PT Goals Patient Stated Goal: none stated Time For Goal Achievement: 08/02/18 Potential to Achieve Goals: Good    Frequency Min 3X/week   Barriers to discharge        Co-evaluation               AM-PAC PT "6 Clicks" Daily Activity  Outcome Measure Difficulty turning over in bed (including adjusting bedclothes, sheets and blankets)?: None Difficulty moving from lying on back to sitting on the side of the bed? : None Difficulty sitting down on and standing up from a chair with arms (e.g., wheelchair, bedside commode, etc,.)?: A Little Help needed moving to and from a bed to chair (including a  wheelchair)?: A Little Help needed walking in hospital room?: A Little Help needed climbing 3-5 steps with a railing? : A Little 6 Click Score: 20    End of Session   Activity Tolerance: Patient tolerated treatment well Patient left: in bed;with nursing/sitter in room Nurse Communication: Mobility status PT Visit Diagnosis: Unsteadiness on feet (R26.81)    Time: 1610-9604 PT Time Calculation (min) (ACUTE ONLY): 10 min   Charges:   PT Evaluation $PT Eval Low Complexity: 1 Low          Michel Santee 07/26/2018, 10:26 AM

## 2018-07-27 LAB — GLUCOSE, CAPILLARY
GLUCOSE-CAPILLARY: 164 mg/dL — AB (ref 70–99)
Glucose-Capillary: 131 mg/dL — ABNORMAL HIGH (ref 70–99)
Glucose-Capillary: 166 mg/dL — ABNORMAL HIGH (ref 70–99)
Glucose-Capillary: 116 mg/dL — ABNORMAL HIGH (ref 70–99)

## 2018-07-27 LAB — BASIC METABOLIC PANEL
Anion gap: 3 — ABNORMAL LOW (ref 5–15)
BUN: 7 mg/dL — AB (ref 8–23)
CO2: 28 mmol/L (ref 22–32)
CREATININE: 0.71 mg/dL (ref 0.61–1.24)
Calcium: 8.9 mg/dL (ref 8.9–10.3)
Chloride: 101 mmol/L (ref 98–111)
GFR calc Af Amer: >60 mL/min (ref >60)
GFR calc non Af Amer: >60 mL/min (ref >60)
GLUCOSE: 156 mg/dL — AB (ref 70–99)
Potassium: 4.5 mmol/L (ref 3.5–5.1)
Sodium: 132 mmol/L — ABNORMAL LOW (ref 135–145)

## 2018-07-27 MED ORDER — SODIUM CHLORIDE 0.9 % IV BOLUS
500.0000 mL | Freq: Once | INTRAVENOUS | Status: AC
  Administered 2018-07-27: 500 mL via INTRAVENOUS

## 2018-07-27 MED ORDER — HYDRALAZINE HCL 50 MG PO TABS
50.0000 mg | ORAL_TABLET | Freq: Three times a day (TID) | ORAL | Status: DC
  Administered 2018-07-27 – 2018-08-05 (×26): 50 mg via ORAL
  Filled 2018-07-27 (×26): qty 1

## 2018-07-27 MED ORDER — ACETAMINOPHEN 325 MG PO TABS
650.0000 mg | ORAL_TABLET | Freq: Four times a day (QID) | ORAL | Status: DC | PRN
  Administered 2018-07-27 – 2018-08-02 (×10): 650 mg via ORAL
  Filled 2018-07-27 (×10): qty 2

## 2018-07-27 NOTE — Progress Notes (Signed)
PROGRESS NOTE    John Dimarco Sr.  FTD:[id number] DOB: Oct 11, 1950 DOA: 07/19/2018 PCP: Patient, No Pcp Per   Brief Narrative: [name]  is a 67 y.o. male, w dm2, who c/o left foot pain x 3 months with tip of 1st digit left foot black and discolored.  Pt apparently presented to ED due to redness on the dorsum of the left foot. Pt notes trauma about 41months ago when he dropped something on his foot.  Pt denies fever, chills, cp, palp, sob, n/v, diarrhea, brbpr.  Pt can't afford diabetic medication but can apparently afford cigarettes.  He came to the hospital he was diagnosed with left first toe gangrene highly suspicious for ischemic injury, vascular surgery was consulted and he was placed on empiric antibiotics along with heparin drip.   Subjective -  Patient in bed, appears comfortable, denies any headache, no fever, no chest pain or pressure, no shortness of breath , no abdominal pain. No focal weakness. Mild L foot pain.   Assessment & Plan:   1. Left LE  Chronic ischemia, first toe gangrene with some surrounding cellulitis.  Continue with IV antibiotics. Cultures negative, started on statin, aspirin and Plavix, was also on heparin drip, underwent angiogram on 07/24/2018 by vascular surgery with  left common iliac artery stent placement, left SFA above/knee popliteal artery occlusion balloon angioplasty with DES stent placement, he has subsequently undergone left foot first toe ray amputation on 07/25/2018, still has surrounding cellulitis hence antibiotics will be continued, his amputated site is healing but still has poor circulation, I question if he will end up with BKA.  Vascular surgery following.  2. HTN; added on Norvasc, added hydralazine dose increased for better control.  3. DM 2; SSI; check HBA1c. 7.4 - DM education ordered.  CBG (last 3)  Recent Labs    07/26/18 1727 07/26/18 2201 07/27/18 0621  GLUCAP 180* 98 131*    4. Tobacco use; counseling provided.    5. Hypokalemia; replaced & stable.  6.  Loud harsh systolic murmur mostly in the aortic valve area.  Echocardiogram noted which shows a preserved EF of 60 to 65% with moderate to severe aortic stenosis, outpatient follow-up with cardiology and cardiothoracic surgery, hemodynamically he is compensated this is chronic.    DVT prophylaxis: heparin  Code Status:  Full code.  Family Communication: family at bedside.  Disposition Plan: remain inpatient for IV antibiotics and heparin gtt  Consultants:   Vascular.    Procedures:     Angiogram - 07/24/18 -this post left common iliac artery stent placement, left SFA above/knee popliteal artery occlusion balloon angioplasty with DES stent placement.  -  Left foot first toe ray amputation on 08/24/2018.   TTE -  - Left ventricle: The cavity size was normal. Wall thickness was increased in a pattern of mild LVH. Systolic function was normal. The estimated ejection fraction was in the range of 60% to 65%.  Wall motion was normal; there were no regional wall motion abnormalities. Doppler parameters are consistent with abnormal left ventricular relaxation (grade 1 diastolic dysfunction). - Aortic valve: Moderately to severely calcified annulus.  Moderately thickened, moderately calcified leaflets. There was moderate to severe stenosis. Valve area (VTI): 1.06 cm^2. Valve  area (Vmean): 1.02 cm^2. - Mitral valve: Mildly calcified annulus.    Antimicrobials:   Vancomycin  cefepime   Objective:  Blood pressure (!) 144/79, pulse 91, temperature 99.2 F (37.3 C), temperature source Oral, resp. rate 17, height 5' 10.98" (1.803 m), weight  71.6 kg, SpO2 94 %.   Examination:  Awake Alert, Oriented X 3, No new F.N deficits, Normal affect [place].AT,PERRAL Supple Neck,No JVD, No cervical lymphadenopathy appriciated.  Symmetrical Chest wall movement, Good air movement bilaterally, CTAB RRR,No Gallops, Rubs or new Murmurs, No Parasternal  Heave +ve B.Sounds, Abd Soft, No tenderness, No organomegaly appriciated, No rebound - guarding or rigidity. No Cyanosis, Clubbing or edema, left foot post 1st toe ray amputation under bandage with some surrounding cellulitis.   Data Reviewed: I have personally reviewed following labs and imaging studies  CBC:  Recent Labs  Lab 07/22/18 0430 07/23/18 0339 07/24/18 0441 07/25/18 0342 07/26/18 0606  WBC 7.0 7.6 6.4 6.8 10.0  HGB 14.7 16.0 14.0 14.2 14.6  HCT 43.8 47.6 43.0 43.3 43.9  MCV 89.6 89.3 89.4 89.3 90.1  PLT 180 212 189 193 992   Basic Metabolic Panel:  Recent Labs  Lab 07/22/18 0430 07/23/18 0339 07/24/18 0441 07/26/18 0606 07/27/18 0356  NA 140 138 138 134* 132*  K 3.3* 3.7 3.7 3.4* 4.5  CL 108 105 108 101 101  CO2 26 24 22 27 28   GLUCOSE 151* 105* 116* 123* 156*  BUN 6* <5* 6* 7* 7*  CREATININE 0.55* 0.63 0.55* 0.75 0.71  CALCIUM 8.7* 9.2 8.8* 9.1 8.9  MG  --  1.9  --  1.8  --    GFR: Estimated Creatinine Clearance: 92 mL/min (by C-G formula based on SCr of 0.71 mg/dL). Liver Function Tests: No results for input(s): AST, ALT, ALKPHOS, BILITOT, PROT, ALBUMIN in the last 168 hours. No results for input(s): LIPASE, AMYLASE in the last 168 hours. No results for input(s): AMMONIA in the last 168 hours. Coagulation Profile: No results for input(s): INR, PROTIME in the last 168 hours. Cardiac Enzymes: No results for input(s): CKTOTAL, CKMB, CKMBINDEX, TROPONINI in the last 168 hours. BNP (last 3 results) No results for input(s): PROBNP in the last 8760 hours. HbA1C: No results for input(s): HGBA1C in the last 72 hours. CBG: Recent Labs  Lab 07/26/18 0632 07/26/18 1200 07/26/18 1727 07/26/18 2201 07/27/18 0621  GLUCAP 124* 155* 180* 98 131*   Lipid Profile: No results for input(s): CHOL, HDL, LDLCALC, TRIG, CHOLHDL, LDLDIRECT in the last 72 hours. Thyroid Function Tests: No results for input(s): TSH, T4TOTAL, FREET4, T3FREE, THYROIDAB in the last  72 hours. Anemia Panel: No results for input(s): VITAMINB12, FOLATE, FERRITIN, TIBC, IRON, RETICCTPCT in the last 72 hours. Sepsis Labs: Recent Labs  Lab 07/20/18 1832 07/20/18 2121  LATICACIDVEN 1.2 1.0    Recent Results (from the past 240 hour(s))  Blood Culture (routine x 2)     Status: None   Collection Time: 07/20/18 12:39 AM  Result Value Ref Range Status   Specimen Description RIGHT ANTECUBITAL  Final   Special Requests   Final    BOTTLES DRAWN AEROBIC AND ANAEROBIC Blood Culture adequate volume   Culture   Final    NO GROWTH 5 DAYS Performed at [facility], [address] [place], [place] [address]    Report Status 07/25/2018 FINAL  Final  Blood Culture (routine x 2)     Status: None   Collection Time: 07/20/18 12:42 AM  Result Value Ref Range Status   Specimen Description RIGHT ANTECUBITAL  Final   Special Requests   Final    BOTTLES DRAWN AEROBIC AND ANAEROBIC Blood Culture adequate volume   Culture   Final    NO GROWTH 5 DAYS Performed at [facility], [address], [place], [place] [address]    Report Status 07/25/2018 FINAL  Final     Radiology Studies:  No results found.  Scheduled Meds: . amLODipine  10 mg Oral Daily  . aspirin EC  81 mg Oral Daily  . atorvastatin  20 mg Oral q1800  . carvedilol  6.25 mg Oral BID WC  . clopidogrel  75 mg Oral Q breakfast  . hydrALAZINE  25 mg Oral Q8H  . insulin aspart  0-9 Units Subcutaneous TID WC  . nicotine  14 mg Transdermal Daily  . sodium chloride flush  3 mL Intravenous Q12H   Continuous Infusions: . sodium chloride 10 mL/hr at 07/26/18 0117  . ceFEPime (MAXIPIME) IV 2 g (07/27/18 0557)  . vancomycin 1,250 mg (07/27/18 0034)     LOS: 6 days   Time spent: 35 minutes.   Signature  Lala Lund M.D on 07/27/2018 at 11:11 AM   -  To page go to [email or web address] - password [email or web address]

## 2018-07-27 NOTE — Progress Notes (Addendum)
  Progress Note    07/27/2018 8:25 AM 2 Days Post-Op  Subjective:  No complaints  Tm 100 now 99.2  HR 80's-100's  789'F-810'F systolic 75% RA  Vitals:   07/27/18 0624 07/27/18 0757  BP: (!) 145/78 (!) 144/79  Pulse: (!) 101 91  Resp: 15 17  Temp: 99.8 F (37.7 C) 99.2 F (37.3 C)  SpO2: 94% 94%    Physical Exam: Cardiac:  regular Lungs:  Non labored  Incisions:      CBC    Component Value Date/Time   WBC 10.0 07/26/2018 0606   RBC 4.87 07/26/2018 0606   HGB 14.6 07/26/2018 0606   HCT 43.9 07/26/2018 0606   PLT 216 07/26/2018 0606   MCV 90.1 07/26/2018 0606   MCH 30.0 07/26/2018 0606   MCHC 33.3 07/26/2018 0606   RDW 13.1 07/26/2018 0606   LYMPHSABS 2.5 07/20/2018 0039   MONOABS 0.7 07/20/2018 0039   EOSABS 0.2 07/20/2018 0039   BASOSABS 0.1 07/20/2018 0039    BMET    Component Value Date/Time   NA PENDING 07/27/2018 0356   K PENDING 07/27/2018 0356   CL PENDING 07/27/2018 0356   CO2 PENDING 07/27/2018 0356   GLUCOSE 156 (H) 07/27/2018 0356   BUN 7 (L) 07/27/2018 0356   CREATININE 0.71 07/27/2018 0356   CALCIUM 8.9 07/27/2018 0356   GFRNONAA >60 07/27/2018 0356   GFRAA >60 07/27/2018 0356    INR    Component Value Date/Time   INR 0.87 07/20/2018 0039     Intake/Output Summary (Last 24 hours) at 07/27/2018 0825 Last data filed at 07/27/2018 0600 Gross per 24 hour  Intake 120 ml  Output 975 ml  Net -855 ml     Assessment:  67 y.o. male is s/p:  Left great toe amputation  2 Days Post-Op  Plan: -pt's amputation is tenuous.  Will need to demarcate, but may need more proximal amputation.  Will continue to monitor. -has a flat bottom shoe-will order a darco shoe for pt; heel weight bearing only   Leontine Locket, PA-C [facility] [phone] 07/27/2018 8:25 AM  I have examined the patient, reviewed and agree with above.  Discussed with patient.  He does have a palpable dorsalis pedis pulse.  Dusky on the dorsum  with probable full-thickness loss at this area.  We will continue to allow to demarcate. Curt Jews, MD 07/27/2018 8:41 AM

## 2018-07-27 NOTE — Progress Notes (Signed)
Orthopedic Tech Progress Note Patient Details:  John Lynk Sr. 09/27/1950 [id number]  Ortho Devices Type of Ortho Device: Darco shoe Ortho Device/Splint Interventions: Application   Post Interventions Patient Tolerated: Well Instructions Provided: Care of device   [name] 07/27/2018, 6:08 PM

## 2018-07-27 NOTE — Progress Notes (Signed)
Patient had minimal bleeding to toe amputation site. New dressing to site and encouraged him to elevate for now and rest incision after initial dressing removal/change. He agrees.

## 2018-07-28 LAB — BASIC METABOLIC PANEL
ANION GAP: 11 (ref 5–15)
BUN: 5 mg/dL — ABNORMAL LOW (ref 8–23)
CO2: 26 mmol/L (ref 22–32)
Calcium: 8.5 mg/dL — ABNORMAL LOW (ref 8.9–10.3)
Chloride: 98 mmol/L (ref 98–111)
Creatinine, Ser: 0.64 mg/dL (ref 0.61–1.24)
GFR calc Af Amer: >60 mL/min (ref >60)
GLUCOSE: 121 mg/dL — AB (ref 70–99)
Potassium: 3.3 mmol/L — ABNORMAL LOW (ref 3.5–5.1)
Sodium: 135 mmol/L (ref 135–145)

## 2018-07-28 LAB — GLUCOSE, CAPILLARY
GLUCOSE-CAPILLARY: 169 mg/dL — AB (ref 70–99)
GLUCOSE-CAPILLARY: 119 mg/dL — AB (ref 70–99)
Glucose-Capillary: 141 mg/dL — ABNORMAL HIGH (ref 70–99)
Glucose-Capillary: 166 mg/dL — ABNORMAL HIGH (ref 70–99)

## 2018-07-28 NOTE — Progress Notes (Signed)
PROGRESS NOTE    John Magnan Sr.  NUU:[id number] DOB: [birth year] DOA: 07/19/2018 PCP: Patient, No Pcp Per   Brief Narrative: [name]  is a 67 y.o. male, w dm2, who c/o left foot pain x 3 months with tip of 1st digit left foot black and discolored.  Pt apparently presented to ED due to redness on the dorsum of the left foot. Pt notes trauma about 65months ago when he dropped something on his foot.  Pt denies fever, chills, cp, palp, sob, n/v, diarrhea, brbpr.  Pt can't afford diabetic medication but can apparently afford cigarettes.  He came to the hospital he was diagnosed with left first toe gangrene highly suspicious for ischemic injury, vascular surgery was consulted and he was placed on empiric antibiotics along with heparin drip.   Subjective -  Patient in bed, appears comfortable, denies any headache, no fever, no chest pain or pressure, no shortness of breath , no abdominal pain. No focal weakness. Mild L foot pain.   Assessment & Plan:   1. Left LE  Chronic ischemia, first toe gangrene with some surrounding cellulitis.  Continue with IV antibiotics. Cultures negative, started on statin, aspirin and Plavix, was also on heparin drip, underwent angiogram on 07/24/2018 by vascular surgery with  left common iliac artery stent placement, left SFA above/knee popliteal artery occlusion balloon angioplasty with DES stent placement, he has subsequently undergone left foot first toe ray amputation on 07/25/2018, still has surrounding cellulitis hence antibiotics will be continued, his amputated site is healing but still has poor circulation, I question if he will end up with BKA.  Vascular surgery following.  Darco to has been provided to the patient.  2. HTN; added on Norvasc, added hydralazine dose increased for better control.  3. DM 2; SSI; check HBA1c. 7.4 - DM education ordered.  CBG (last 3)  Recent Labs    07/27/18 1604 07/27/18 2206 07/28/18 0618  GLUCAP 116* 164* 141*     4. Tobacco use; counseling provided.   5. Hypokalemia; replaced & stable.  6.  Loud harsh systolic murmur mostly in the aortic valve area.  Echocardiogram noted which shows a preserved EF of 60 to 65% with moderate to severe aortic stenosis, outpatient follow-up with cardiology and cardiothoracic surgery, hemodynamically he is compensated this is chronic.    DVT prophylaxis: heparin  Code Status:  Full code.  Family Communication: family at bedside.  Disposition Plan: remain inpatient for IV antibiotics and heparin gtt  Consultants:   Vascular.    Procedures:     Angiogram - 07/24/18 -this post left common iliac artery stent placement, left SFA above/knee popliteal artery occlusion balloon angioplasty with DES stent placement.  -  Left foot first toe ray amputation on 08/24/2018.   TTE -  - Left ventricle: The cavity size was normal. Wall thickness was increased in a pattern of mild LVH. Systolic function was normal. The estimated ejection fraction was in the range of 60% to 65%.  Wall motion was normal; there were no regional wall motion abnormalities. Doppler parameters are consistent with abnormal left ventricular relaxation (grade 1 diastolic dysfunction). - Aortic valve: Moderately to severely calcified annulus.  Moderately thickened, moderately calcified leaflets. There was moderate to severe stenosis. Valve area (VTI): 1.06 cm^2. Valve  area (Vmean): 1.02 cm^2. - Mitral valve: Mildly calcified annulus.    Antimicrobials:   Vancomycin  cefepime   Objective:  Blood pressure (!) 144/79, pulse 91, temperature 99.2 F (37.3 C), temperature source Oral,  resp. rate 17, height 5' 10.98" (1.803 m), weight 71.6 kg, SpO2 94 %.   Examination:  Awake Alert, Oriented X 3, No new F.N deficits, Normal affect [place].AT,PERRAL Supple Neck,No JVD, No cervical lymphadenopathy appriciated.  Symmetrical Chest wall movement, Good air movement bilaterally, CTAB RRR,No Gallops,  Rubs or new Murmurs, No Parasternal Heave +ve B.Sounds, Abd Soft, No tenderness, No organomegaly appriciated, No rebound - guarding or rigidity. No Cyanosis, Clubbing or edema, left foot post 1st toe ray amputation under bandage with some surrounding cellulitis.   Data Reviewed: I have personally reviewed following labs and imaging studies  CBC:  Recent Labs  Lab 07/22/18 0430 07/23/18 0339 07/24/18 0441 07/25/18 0342 07/26/18 0606  WBC 7.0 7.6 6.4 6.8 10.0  HGB 14.7 16.0 14.0 14.2 14.6  HCT 43.8 47.6 43.0 43.3 43.9  MCV 89.6 89.3 89.4 89.3 90.1  PLT 180 212 189 193 456   Basic Metabolic Panel:  Recent Labs  Lab 07/22/18 0430 07/23/18 0339 07/24/18 0441 07/26/18 0606 07/27/18 0356  NA 140 138 138 134* 132*  K 3.3* 3.7 3.7 3.4* 4.5  CL 108 105 108 101 101  CO2 26 24 22 27 28   GLUCOSE 151* 105* 116* 123* 156*  BUN 6* <5* 6* 7* 7*  CREATININE 0.55* 0.63 0.55* 0.75 0.71  CALCIUM 8.7* 9.2 8.8* 9.1 8.9  MG  --  1.9  --  1.8  --    GFR: Estimated Creatinine Clearance: 87.9 mL/min (by C-G formula based on SCr of 0.71 mg/dL). Liver Function Tests: No results for input(s): AST, ALT, ALKPHOS, BILITOT, PROT, ALBUMIN in the last 168 hours. No results for input(s): LIPASE, AMYLASE in the last 168 hours. No results for input(s): AMMONIA in the last 168 hours. Coagulation Profile: No results for input(s): INR, PROTIME in the last 168 hours. Cardiac Enzymes: No results for input(s): CKTOTAL, CKMB, CKMBINDEX, TROPONINI in the last 168 hours. BNP (last 3 results) No results for input(s): PROBNP in the last 8760 hours. HbA1C: No results for input(s): HGBA1C in the last 72 hours. CBG: Recent Labs  Lab 07/27/18 0621 07/27/18 1129 07/27/18 1604 07/27/18 2206 07/28/18 0618  GLUCAP 131* 166* 116* 164* 141*   Lipid Profile: No results for input(s): CHOL, HDL, LDLCALC, TRIG, CHOLHDL, LDLDIRECT in the last 72 hours. Thyroid Function Tests: No results for input(s): TSH,  T4TOTAL, FREET4, T3FREE, THYROIDAB in the last 72 hours. Anemia Panel: No results for input(s): VITAMINB12, FOLATE, FERRITIN, TIBC, IRON, RETICCTPCT in the last 72 hours. Sepsis Labs: No results for input(s): PROCALCITON, LATICACIDVEN in the last 168 hours.  Recent Results (from the past 240 hour(s))  Blood Culture (routine x 2)     Status: None   Collection Time: 07/20/18 12:39 AM  Result Value Ref Range Status   Specimen Description RIGHT ANTECUBITAL  Final   Special Requests   Final    BOTTLES DRAWN AEROBIC AND ANAEROBIC Blood Culture adequate volume   Culture   Final    NO GROWTH 5 DAYS Performed at [facility], [address], [place], [place] [address]    Report Status 07/25/2018 FINAL  Final  Blood Culture (routine x 2)     Status: None   Collection Time: 07/20/18 12:42 AM  Result Value Ref Range Status   Specimen Description RIGHT ANTECUBITAL  Final   Special Requests   Final    BOTTLES DRAWN AEROBIC AND ANAEROBIC Blood Culture adequate volume   Culture   Final    NO GROWTH 5 DAYS  Performed at [facility], [address], [place], [place] [address]    Report Status 07/25/2018 FINAL  Final     Radiology Studies:  No results found.  Scheduled Meds: . amLODipine  10 mg Oral Daily  . aspirin EC  81 mg Oral Daily  . atorvastatin  20 mg Oral q1800  . carvedilol  6.25 mg Oral BID WC  . clopidogrel  75 mg Oral Q breakfast  . hydrALAZINE  50 mg Oral Q8H  . insulin aspart  0-9 Units Subcutaneous TID WC  . nicotine  14 mg Transdermal Daily  . sodium chloride flush  3 mL Intravenous Q12H   Continuous Infusions: . sodium chloride 10 mL/hr at 07/26/18 0117  . ceFEPime (MAXIPIME) IV 2 g (07/28/18 0522)  . vancomycin Stopped (07/28/18 0143)     LOS: 7 days   Time spent: 35 minutes.   Signature  Lala Lund M.D on 07/28/2018 at 9:07 AM   -  To page go to [email or web address] - password [email or web address]

## 2018-07-28 NOTE — Progress Notes (Signed)
Subjective: Interval History: none.. Comfortable.  Mild discomfort in his foot  Objective: Vital signs in last 24 hours: Temp:  [97.9 F (36.6 C)-102 F (38.9 C)] 100 F (37.8 C) (11/03 0810) Pulse Rate:  [85-96] 92 (11/03 0529) Resp:  [18-27] 20 (11/03 0810) BP: (145-161)/(70-80) 145/70 (11/03 0810) SpO2:  [94 %-96 %] 94 % (11/03 0810) Weight:  [68.4 kg] 68.4 kg (11/03 0529)  Intake/Output from previous day: 11/02 0701 - 11/03 0700 In: 2070 [P.O.:1320; IV Piggyback:750] Out: 3101 [Urine:3100; Stool:1] Intake/Output this shift: No intake/output data recorded.  Left foot continues to heal.  Continues to have approximately 2 to 3 cm area on the dorsum of his foot at the base of his second toe which will probably be full-thickness loss.  All of the erythema that was extending up onto his ankle and pretibial area has completely resolved.  Again edges of the right amputation appear viable.  Continues to have easily palpable dorsalis pedis pulse  Lab Results: Recent Labs    07/26/18 0606  WBC 10.0  HGB 14.6  HCT 43.9  PLT 216   BMET Recent Labs    07/26/18 0606 07/27/18 0356  NA 134* 132*  K 3.4* 4.5  CL 101 101  CO2 27 28  GLUCOSE 123* 156*  BUN 7* 7*  CREATININE 0.75 0.71  CALCIUM 9.1 8.9    Studies/Results: Dg Tibia/fibula Left  Result Date: 07/20/2018 CLINICAL DATA:  LEFT foot discoloration.  History of diabetes. EXAM: LEFT FOOT - COMPLETE 3+ VIEW; LEFT TIBIA AND FIBULA - 2 VIEW COMPARISON:  None. FINDINGS: LEFT tibia and fibula: No destructive bony lesions. Mild degenerative changes the included knee with faint periarticular calcifications. Moderate vascular calcifications. No destructive bony lesions. Soft tissue planes are non suspicious. LEFT foot: No fracture deformity or dislocation. 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 10 mm geographic lucent lesion first metatarsal head.  Severe vascular calcifications. Soft tissue planes are non suspicious. IMPRESSION: 1. No fracture deformity or dislocation. No radiographic findings of osteomyelitis. 2. Cystic lesion first metatarsal head may reflect gout or geode. 3. Severe vascular calcifications. Electronically Signed   By: Elon Alas M.D.   On: 07/20/2018 00:35   Dg Foot Complete Left  Result Date: 07/20/2018 CLINICAL DATA:  LEFT foot discoloration.  History of diabetes. EXAM: LEFT FOOT - COMPLETE 3+ VIEW; LEFT TIBIA AND FIBULA - 2 VIEW COMPARISON:  None. FINDINGS: LEFT tibia and fibula: No destructive bony lesions. Mild degenerative changes the included knee with faint periarticular calcifications. Moderate vascular calcifications. No destructive bony lesions. Soft tissue planes are non suspicious. LEFT foot: No fracture deformity or dislocation. 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 10 mm geographic lucent lesion first metatarsal head. Severe vascular calcifications. Soft tissue planes are non suspicious. IMPRESSION: 1. No fracture deformity or dislocation. No radiographic findings of osteomyelitis. 2. Cystic lesion first metatarsal head may reflect gout or geode. 3. Severe vascular calcifications. Electronically Signed   By: Elon Alas M.D.   On: 07/20/2018 00:35   Vas [place] Burnard Bunting With/wo Tbi  Result Date: 07/21/2018 LOWER EXTREMITY DOPPLER STUDY Indications: Peripheral artery disease.  Performing Technologist: Birdena Crandall, [place] [facility]  Examination Guidelines: A complete evaluation includes at minimum, Doppler waveform signals and systolic blood pressure reading at the level of bilateral brachial, anterior tibial, and posterior tibial arteries, when vessel segments are accessible. Bilateral testing is considered an integral part of a  complete examination. Photoelectric Plethysmograph (PPG) waveforms and toe systolic pressure readings are  included as required and additional duplex testing as needed. Limited examinations for reoccurring indications may be performed as noted.  ABI Findings: +---------+------------------+-----+---------+--------+ Right    Rt Pressure (mmHg)IndexWaveform Comment  +---------+------------------+-----+---------+--------+ Brachial 188                    triphasic         +---------+------------------+-----+---------+--------+ PTA      176               0.94 triphasic         +---------+------------------+-----+---------+--------+ DP       177               0.94 triphasic         +---------+------------------+-----+---------+--------+ Great Toe63                0.34                   +---------+------------------+-----+---------+--------+ +---------+-----------------+-----+---------------------------+----------------+ Left     Lt Pressure      IndexWaveform                   Comment                   (mmHg)                                                            +---------+-----------------+-----+---------------------------+----------------+ Brachial 160                   triphasic                                   +---------+-----------------+-----+---------------------------+----------------+ PTA      42               0.22 severely dampened mnophasic                 +---------+-----------------+-----+---------------------------+----------------+ DP       57               0.30 severely dampened                                                          monophasic                                  +---------+-----------------+-----+---------------------------+----------------+ Great Toe0                0.00                            absent waveforms +---------+-----------------+-----+---------------------------+----------------+ Calf     51               0.27 severely dampened  monophasic                                   +---------+-----------------+-----+---------------------------+----------------+ +-------+-----------+-----------+------------+------------+ ABI/TBIToday's ABIToday's TBIPrevious ABIPrevious TBI +-------+-----------+-----------+------------+------------+ Right  0.94       0.34                                +-------+-----------+-----------+------------+------------+ Left   0.30       0.00                                +-------+-----------+-----------+------------+------------+ Technically difficult to evaluate Doppler waveforms on the left due to venous flow interference.  Summary: See technical note listed above Right: Resting right ankle-brachial index is within normal range. No evidence of significant right lower extremity arterial disease. The right toe-brachial index is abnormal. Left: Resting left ankle-brachial index indicates severe left lower extremity arterial disease. The left toe-brachial index is abnormal.  *See table(s) above for measurements and observations.  Electronically signed by Monica Martinez MD on 07/21/2018 at 4:00:50 PM.    Final    Anti-infectives: Anti-infectives (From admission, onward)   Start     Dose/Rate Route Frequency Ordered Stop   07/25/18 1256  vancomycin (VANCOCIN) 1,250 mg in sodium chloride 0.9 % 250 mL IVPB     1,250 mg 166.7 mL/hr over 90 Minutes Intravenous Every 12 hours 07/25/18 1256     07/24/18 0000  vancomycin (VANCOCIN) 1,250 mg in sodium chloride 0.9 % 250 mL IVPB  Status:  Discontinued     1,250 mg 166.7 mL/hr over 90 Minutes Intravenous Every 12 hours 07/23/18 1814 07/25/18 1256   07/20/18 0515  vancomycin (VANCOCIN) IVPB 1000 mg/200 mL premix     1,000 mg 200 mL/hr over 60 Minutes Intravenous Every 12 hours 07/20/18 0501 07/23/18 1835   07/20/18 0515  ceFEPIme (MAXIPIME) 2 g in sodium chloride 0.9 % 100 mL IVPB     2 g 200 mL/hr over 30 Minutes Intravenous Every 12 hours 07/20/18 0501      07/20/18 0245  clindamycin (CLEOCIN) IVPB 600 mg     600 mg 100 mL/hr over 30 Minutes Intravenous  Once 07/20/18 0232 07/20/18 0303      Assessment/Plan: s/p Procedure(s): LEFT GREAT TOE AMPUTATION (Left) Continue current therapy.  Okay to walk with Darco shoe.  Again discussed my concern regarding probable full-thickness area of loss on the dorsum of his foot.  Continue to monitor   LOS: 7 days     07/28/2018, 9:14 AM

## 2018-07-29 LAB — CBC
HEMATOCRIT: 34.8 % — AB (ref 39.0–52.0)
Hemoglobin: 11.6 g/dL — ABNORMAL LOW (ref 13.0–17.0)
MCH: 29.7 pg (ref 26.0–34.0)
MCHC: 33.3 g/dL (ref 30.0–36.0)
MCV: 89 fL (ref 80.0–100.0)
NRBC: 0 % (ref 0.0–0.2)
PLATELETS: 211 10*3/uL (ref 150–400)
RBC: 3.91 MIL/uL — AB (ref 4.22–5.81)
RDW: 12.8 % (ref 11.5–15.5)
WBC: 8.6 10*3/uL (ref 4.0–10.5)

## 2018-07-29 LAB — BASIC METABOLIC PANEL
ANION GAP: 5 (ref 5–15)
BUN: 6 mg/dL — ABNORMAL LOW (ref 8–23)
CALCIUM: 8.3 mg/dL — AB (ref 8.9–10.3)
CO2: 27 mmol/L (ref 22–32)
Chloride: 104 mmol/L (ref 98–111)
Creatinine, Ser: 0.62 mg/dL (ref 0.61–1.24)
Glucose, Bld: 140 mg/dL — ABNORMAL HIGH (ref 70–99)
POTASSIUM: 3.3 mmol/L — AB (ref 3.5–5.1)
Sodium: 136 mmol/L (ref 135–145)

## 2018-07-29 LAB — MAGNESIUM: Magnesium: 2 mg/dL (ref 1.7–2.4)

## 2018-07-29 LAB — GLUCOSE, CAPILLARY
GLUCOSE-CAPILLARY: 151 mg/dL — AB (ref 70–99)
GLUCOSE-CAPILLARY: 149 mg/dL — AB (ref 70–99)
Glucose-Capillary: 163 mg/dL — ABNORMAL HIGH (ref 70–99)
Glucose-Capillary: 193 mg/dL — ABNORMAL HIGH (ref 70–99)

## 2018-07-29 MED ORDER — POTASSIUM CHLORIDE CRYS ER 20 MEQ PO TBCR
40.0000 meq | EXTENDED_RELEASE_TABLET | Freq: Once | ORAL | Status: AC
  Administered 2018-07-29: 40 meq via ORAL
  Filled 2018-07-29: qty 2

## 2018-07-29 NOTE — Progress Notes (Signed)
Occupational Therapy Treatment Patient Details Name: John Virginia Sr. MRN: [id number] DOB: 24-Jun-1951 Today's Date: 07/29/2018    History of present illness 67 yo admitted with necrotic left toe s/p revascularization 10/30 and ray amputation 10/31. Pt to have lt transmet amputation on 11/5 or 11/6. PMhx: HTN, DM, arthritis   OT comments  This 67 yo male admitted and underwent above presents to acute OT with making progress with overall mobility with still needing min guard-min A when up on his feet. He will continue to benefit from acute OT with follow up [place] post D/C. Will continue to reassess post transmetatarsal amputation.  Follow Up Recommendations  Home health OT;Supervision/Assistance - 24 hour    Equipment Recommendations  3 in 1 bedside commode       Precautions / Restrictions Precautions Precautions: Fall Restrictions Weight Bearing Restrictions: Yes LLE Weight Bearing: Weight bearing as tolerated Other Position/Activity Restrictions: WBAT in Darco       Mobility Bed Mobility Overal bed mobility: Independent                Transfers Overall transfer level: Needs assistance Equipment used: Rolling walker (2 wheeled) Transfers: Sit to/from Stand Sit to Stand: Min guard         General transfer comment: VCs for safe hand placement    Balance Overall balance assessment: Needs assistance Sitting-balance support: No upper extremity supported;Feet supported Sitting balance-Leahy Scale: Good     Standing balance support: No upper extremity supported;During functional activity Standing balance-Leahy Scale: Poor Standing balance comment: however pt did lean up against sink while standing to wash his hands                           ADL either performed or assessed with clinical judgement   ADL Overall ADL's : Needs assistance/impaired     Grooming: Wash/dry hands;Set up;Supervision/safety Grooming Details (indicate cue type and reason):  minimal A for standing balance at sink             Lower Body Dressing: Set up;Supervision/safety;Bed level Lower Body Dressing Details (indicate cue type and reason): to don left Darco shoe and right post op shoe (had him use this to see if it gave him more stabilty with ambulation--pt stated he could tell a difference Toilet Transfer: Ambulation;RW;BSC;Min Psychiatric nurse Details (indicate cue type and reason): over toilet                 Vision Patient Visual Report: No change from baseline            Cognition Arousal/Alertness: Awake/alert Behavior During Therapy: WFL for tasks assessed/performed Overall Cognitive Status: Within Functional Limits for tasks assessed                                                     Pertinent Vitals/ Pain       Pain Assessment: 0-10 Pain Score: 9  Faces Pain Scale: Hurts even more Pain Location: L foot Pain Descriptors / Indicators: Grimacing;Guarding;Throbbing Pain Intervention(s): Limited activity within patient's tolerance;Monitored during session;Patient requesting pain meds-RN notified         Frequency  Min 2X/week        Progress Toward Goals  OT Goals(current goals can now be found in the care plan section)  Progress towards  OT goals: Progressing toward goals     Plan Discharge plan remains appropriate       AM-PAC PT "6 Clicks" Daily Activity     Outcome Measure   Help from another person eating meals?: None Help from another person taking care of personal grooming?: A Little Help from another person toileting, which includes using toliet, bedpan, or urinal?: A Little Help from another person bathing (including washing, rinsing, drying)?: A Little Help from another person to put on and taking off regular upper body clothing?: A Little Help from another person to put on and taking off regular lower body clothing?: A Little 6 Click Score: 19    End of Session Equipment  Utilized During Treatment: Gait belt;Rolling walker;Other (comment)(left Darco shoe and right post op shoe (worn as shoe))  OT Visit Diagnosis: Unsteadiness on feet (R26.81);Pain;Other abnormalities of gait and mobility (R26.89) Pain - Right/Left: Left Pain - part of body: Ankle and joints of foot   Activity Tolerance Patient tolerated treatment well   Patient Left in chair;with call bell/phone within reach;with chair alarm set   Nurse Communication Mobility status        Time: 7673-4193 OT Time Calculation (min): 22 min  Charges: OT General Charges $OT Visit: 1 Visit OT Treatments $Self Care/Home Management : 8-22 mins  [place], OTR/L Acute [employer] Pager [phone] Office [phone]      Almon Register 07/29/2018, 5:18 PM

## 2018-07-29 NOTE — Progress Notes (Signed)
PROGRESS NOTE    [name].  RDE:[id number] DOB: Feb 14, 1951 DOA: 07/19/2018 PCP: Patient, No Pcp Per   Brief Narrative: [name]  is a 67 y.o. male, w dm2, who c/o left foot pain x 3 months with tip of 1st digit left foot black and discolored.  Pt apparently presented to ED due to redness on the dorsum of the left foot. Pt notes trauma about 46months ago when he dropped something on his foot.  Pt denies fever, chills, cp, palp, sob, n/v, diarrhea, brbpr.  Pt can't afford diabetic medication but can apparently afford cigarettes.  He came to the hospital he was diagnosed with left first toe gangrene highly suspicious for ischemic injury, vascular surgery was consulted and he was placed on empiric antibiotics along with heparin drip.   Subjective -  Patient in bed, appears comfortable, denies any headache, no fever, no chest pain or pressure, no shortness of breath , no abdominal pain. No focal weakness. Mild L foot pain.   Assessment & Plan:   1. Left LE  Chronic ischemia, first toe gangrene with some surrounding cellulitis.  Continue with IV antibiotics. Cultures negative, started on statin, aspirin and Plavix, was also on heparin drip, underwent angiogram on 07/24/2018 by vascular surgery with  left common iliac artery stent placement, left SFA above/knee popliteal artery occlusion balloon angioplasty with DES stent placement, he has subsequently undergone left foot first toe ray amputation on 07/25/2018, vascular surgery now planning to do left transmetatarsal amputation on coming Wednesday, surrounding cellulitis is improving, will continue IV antibiotics till surgery is done thereafter possible oral doxycycline for another 3 to 4 days.  2. HTN; added on Norvasc, added hydralazine dose increased for better control.  3. DM 2; SSI; check HBA1c. 7.4 - DM education ordered.  CBG (last 3)  Recent Labs    07/28/18 1623 07/28/18 2225 07/29/18 0613  GLUCAP 119* 166* 151*    4.  Tobacco use; counseling provided.   5. Hypokalemia; replaced will monitor.  6.  Loud harsh systolic murmur mostly in the aortic valve area.  Echocardiogram noted which shows a preserved EF of 60 to 65% with moderate to severe aortic stenosis, outpatient follow-up with cardiology and cardiothoracic surgery, hemodynamically he is compensated this is chronic.    DVT prophylaxis: heparin  Code Status:  Full code.  Family Communication: family at bedside.  Disposition Plan: remain inpatient for IV antibiotics and heparin gtt  Consultants:   Vascular.    Procedures:     Angiogram - 07/24/18 -this post left common iliac artery stent placement, left SFA above/knee popliteal artery occlusion balloon angioplasty with DES stent placement.  -  Left foot first toe ray amputation on 08/24/2018.   TTE -  - Left ventricle: The cavity size was normal. Wall thickness was increased in a pattern of mild LVH. Systolic function was normal. The estimated ejection fraction was in the range of 60% to 65%.  Wall motion was normal; there were no regional wall motion abnormalities. Doppler parameters are consistent with abnormal left ventricular relaxation (grade 1 diastolic dysfunction). - Aortic valve: Moderately to severely calcified annulus.  Moderately thickened, moderately calcified leaflets. There was moderate to severe stenosis. Valve area (VTI): 1.06 cm^2. Valve  area (Vmean): 1.02 cm^2. - Mitral valve: Mildly calcified annulus.    Antimicrobials:   Vancomycin  cefepime   Objective:  Blood pressure (!) 144/79, pulse 91, temperature 99.2 F (37.3 C), temperature source Oral, resp. rate 17, height 5' 10.98" (1.803  m), weight 71.6 kg, SpO2 94 %.   Examination:   Awake Alert, Oriented X 3, No new F.N deficits, Normal affect [place].AT,PERRAL Supple Neck,No JVD, No cervical lymphadenopathy appriciated.  Symmetrical Chest wall movement, Good air movement bilaterally, CTAB RRR,No Gallops, Rubs  or new Murmurs, No Parasternal Heave +ve B.Sounds, Abd Soft, No tenderness, No organomegaly appriciated, No rebound - guarding or rigidity. No Cyanosis, Clubbing or edema, left foot post 1st toe ray amputation under bandage with some surrounding cellulitis.   Data Reviewed: I have personally reviewed following labs and imaging studies  CBC:  Recent Labs  Lab 07/23/18 0339 07/24/18 0441 07/25/18 0342 07/26/18 0606 07/29/18 0421  WBC 7.6 6.4 6.8 10.0 8.6  HGB 16.0 14.0 14.2 14.6 11.6*  HCT 47.6 43.0 43.3 43.9 34.8*  MCV 89.3 89.4 89.3 90.1 89.0  PLT 212 189 193 216 761   Basic Metabolic Panel:  Recent Labs  Lab 07/23/18 0339 07/24/18 0441 07/26/18 0606 07/27/18 0356 07/28/18 0340 07/29/18 0421  NA 138 138 134* 132* 135 136  K 3.7 3.7 3.4* 4.5 3.3* 3.3*  CL 105 108 101 101 98 104  CO2 24 22 27 28 26 27   GLUCOSE 105* 116* 123* 156* 121* 140*  BUN <5* 6* 7* 7* 5* 6*  CREATININE 0.63 0.55* 0.75 0.71 0.64 0.62  CALCIUM 9.2 8.8* 9.1 8.9 8.5* 8.3*  MG 1.9  --  1.8  --   --  2.0   GFR: Estimated Creatinine Clearance: 87.5 mL/min (by C-G formula based on SCr of 0.62 mg/dL). Liver Function Tests: No results for input(s): AST, ALT, ALKPHOS, BILITOT, PROT, ALBUMIN in the last 168 hours. No results for input(s): LIPASE, AMYLASE in the last 168 hours. No results for input(s): AMMONIA in the last 168 hours. Coagulation Profile: No results for input(s): INR, PROTIME in the last 168 hours. Cardiac Enzymes: No results for input(s): CKTOTAL, CKMB, CKMBINDEX, TROPONINI in the last 168 hours. BNP (last 3 results) No results for input(s): PROBNP in the last 8760 hours. HbA1C: No results for input(s): HGBA1C in the last 72 hours. CBG: Recent Labs  Lab 07/28/18 0618 07/28/18 1119 07/28/18 1623 07/28/18 2225 07/29/18 0613  GLUCAP 141* 169* 119* 166* 151*   Lipid Profile: No results for input(s): CHOL, HDL, LDLCALC, TRIG, CHOLHDL, LDLDIRECT in the last 72 hours. Thyroid  Function Tests: No results for input(s): TSH, T4TOTAL, FREET4, T3FREE, THYROIDAB in the last 72 hours. Anemia Panel: No results for input(s): VITAMINB12, FOLATE, FERRITIN, TIBC, IRON, RETICCTPCT in the last 72 hours. Sepsis Labs: No results for input(s): PROCALCITON, LATICACIDVEN in the last 168 hours.  Recent Results (from the past 240 hour(s))  Blood Culture (routine x 2)     Status: None   Collection Time: 07/20/18 12:39 AM  Result Value Ref Range Status   Specimen Description RIGHT ANTECUBITAL  Final   Special Requests   Final    BOTTLES DRAWN AEROBIC AND ANAEROBIC Blood Culture adequate volume   Culture   Final    NO GROWTH 5 DAYS Performed at [facility], [address] [place], [place] [address]    Report Status 07/25/2018 FINAL  Final  Blood Culture (routine x 2)     Status: None   Collection Time: 07/20/18 12:42 AM  Result Value Ref Range Status   Specimen Description RIGHT ANTECUBITAL  Final   Special Requests   Final    BOTTLES DRAWN AEROBIC AND ANAEROBIC Blood Culture adequate volume   Culture   Final  NO GROWTH 5 DAYS Performed at [facility], [address] [place], [place] [address]    Report Status 07/25/2018 FINAL  Final     Radiology Studies:  No results found.  Scheduled Meds: . amLODipine  10 mg Oral Daily  . aspirin EC  81 mg Oral Daily  . atorvastatin  20 mg Oral q1800  . carvedilol  6.25 mg Oral BID WC  . clopidogrel  75 mg Oral Q breakfast  . hydrALAZINE  50 mg Oral Q8H  . insulin aspart  0-9 Units Subcutaneous TID WC  . nicotine  14 mg Transdermal Daily  . potassium chloride  40 mEq Oral Once   Continuous Infusions: . sodium chloride 10 mL/hr at 07/26/18 0117  . ceFEPime (MAXIPIME) IV 2 g (07/29/18 0428)  . vancomycin Stopped (07/29/18 0303)     LOS: 8 days   Time spent: 35 minutes.   Signature  Lala Lund M.D on 07/29/2018 at 10:33 AM   -  To page go to [email or web address] - password [facility]

## 2018-07-29 NOTE — Progress Notes (Signed)
Physical Therapy Treatment Patient Details Name: [name]. MRN: [id number] DOB: Nov 29, 1950 Today's Date: 07/29/2018    History of Present Illness 67 yo admitted with necrotic left toe s/p revascularization 10/30 and ray amputation 10/31. Pt to have lt transmet amputation on 11/5 or 11/6. PMhx: HTN, DM, arthritis    PT Comments    Pt making steady progress with mobility. Pt needs encouragement to mobilize. Pt to have lt transmetatarsal amputation on 11/5 or 11/6.    Follow Up Recommendations  Home health PT;Supervision - Intermittent     Equipment Recommendations  Rolling walker with 5" wheels    Recommendations for Other Services       Precautions / Restrictions Precautions Precautions: Fall Restrictions Weight Bearing Restrictions: Yes LLE Weight Bearing: Weight bearing as tolerated Other Position/Activity Restrictions: WBAT in [place]    Mobility  Bed Mobility Overal bed mobility: Independent                Transfers Overall transfer level: Needs assistance Equipment used: Rolling walker (2 wheeled) Transfers: Sit to/from Stand Sit to Stand: Min guard         General transfer comment: Assist for balance  Ambulation/Gait Ambulation/Gait assistance: Min guard Gait Distance (Feet): 40 Feet Assistive device: Rolling walker (2 wheeled) Gait Pattern/deviations: Step-to pattern;Decreased stance time - left;Decreased weight shift to left;Trunk flexed Gait velocity: decr Gait velocity interpretation: <1.31 ft/sec, indicative of household ambulator General Gait Details: Assist for safety. Verbal cues for gait sequence. Explained to pt he didn't have to be NWB and that he could place weight on lt heel.   Stairs             Wheelchair Mobility    Modified Rankin (Stroke Patients Only)       Balance Overall balance assessment: Needs assistance Sitting-balance support: No upper extremity supported;Feet supported Sitting balance-Leahy Scale:  Good     Standing balance support: Bilateral upper extremity supported Standing balance-Leahy Scale: Poor Standing balance comment: walker for static standing                            Cognition Arousal/Alertness: Awake/alert Behavior During Therapy: WFL for tasks assessed/performed Overall Cognitive Status: Within Functional Limits for tasks assessed                                        Exercises      General Comments        Pertinent Vitals/Pain Pain Assessment: Faces Faces Pain Scale: Hurts even more Pain Location: L foot Pain Descriptors / Indicators: Grimacing Pain Intervention(s): Limited activity within patient's tolerance;Repositioned    Home Living                      Prior Function            PT Goals (current goals can now be found in the care plan section) Acute Rehab PT Goals Time For Goal Achievement: 08/12/18 Potential to Achieve Goals: Good Progress towards PT goals: Progressing toward goals    Frequency    Min 3X/week      PT Plan Current plan remains appropriate    Co-evaluation              AM-PAC PT "6 Clicks" Daily Activity  Outcome Measure  Difficulty turning over in bed (including adjusting bedclothes, sheets  and blankets)?: None Difficulty moving from lying on back to sitting on the side of the bed? : None Difficulty sitting down on and standing up from a chair with arms (e.g., wheelchair, bedside commode, etc,.)?: Unable Help needed moving to and from a bed to chair (including a wheelchair)?: A Little Help needed walking in hospital room?: A Little Help needed climbing 3-5 steps with a railing? : A Little 6 Click Score: 18    End of Session   Activity Tolerance: Patient tolerated treatment well Patient left: in bed;with call bell/phone within reach Nurse Communication: Mobility status PT Visit Diagnosis: Unsteadiness on feet (R26.81)     Time: 4401-0272 PT Time Calculation  (min) (ACUTE ONLY): 14 min  Charges:  $Gait Training: 8-22 mins                     [place] Pager [phone] Office [place] 07/29/2018, 5:11 PM

## 2018-07-29 NOTE — Progress Notes (Signed)
Pharmacy Antibiotic Note  John Tanzi Sr. is a 67 y.o. male on day # 10 Vancomycin and Cefepime for cellulitis and dry gangrene. S/p LLE revascularization on 10/30 and left great toe amputation on 10/31.  Now planning left transmetatarsal amputation on 11/5.      Vancomycin trough level 12 mcg/ml on 11/1 (goal 15-20 mcg/ml).   Renal function stable.  Plan:  Continue Vancomycin 1250 mg IV q12hrs  Continue Cefepime 2gm IV q12hrs  Follow up post-op antibiotic plans; noted plan for possible change to oral doxycycline post-op.  Height: (charted wrong pt.) Weight: 150 lb 3.2 oz (68.1 kg) IBW/kg (Calculated) : 75.26  Temp (24hrs), Avg:99.9 F (37.7 C), Min:99 F (37.2 C), Max:101.2 F (38.4 C)  Recent Labs  Lab 07/23/18 0339 07/23/18 1653 07/24/18 0441 07/25/18 0342 07/26/18 0606 07/26/18 1157 07/27/18 0356 07/28/18 0340 07/29/18 0421  WBC 7.6  --  6.4 6.8 10.0  --   --   --  8.6  CREATININE 0.63  --  0.55*  --  0.75  --  0.71 0.64 0.62  VANCOTROUGH  --  10*  --   --   --  12*  --   --   --     Estimated Creatinine Clearance: 87.5 mL/min (by C-G formula based on SCr of 0.62 mg/dL).    Allergies  Allergen Reactions  . Tea Itching and Rash    Antimicrobials this admission:  Vancomycin 10/26 >>  Cefepime 10/26>>  Clindamycin x 1 on 10/26  Dose adjustments this admission:     10/29 Vanc trough = 10 on 1gm IV q12hrs> incr 1250 q12h     11/1 Vanc trough = 12 on 1250 mg IV q12h, no change  Microbiology results:  10/26 blood x 2 - negative  Thank you for allowing pharmacy to be a part of this patient's care.  Arty Baumgartner, [place] Pager: [phone] or phone: [phone] 07/29/2018 11:00 AM

## 2018-07-29 NOTE — Progress Notes (Addendum)
[facility]  Subjective  - No new complaints.   Objective (!) 152/72 92 99 F (37.2 C) (Oral) (!) 21 95%  Intake/Output Summary (Last 24 hours) at 07/29/2018 6384 Last data filed at 07/29/2018 0433 Gross per 24 hour  Intake 1070 ml  Output 1650 ml  Net -580 ml    Left forefoot with ischemic changes below amputation site Palpable DP pulse left foot Heart RRR Lungs non labored breathing   Assessment/Planning: S/P Left GT amputation  Plan to return to the [place]. 07/31/2018 for transmetatarsal amputation.  This is a good likely hood to heal secondary to palpable DP on the left foot.  Small vessels occlusion disease left foot.  Roxy Horseman 07/29/2018 7:22 AM --  Laboratory Lab Results: Recent Labs    07/29/18 0421  WBC 8.6  HGB 11.6*  HCT 34.8*  PLT 211   BMET Recent Labs    07/28/18 0340 07/29/18 0421  NA 135 136  K 3.3* 3.3*  CL 98 104  CO2 26 27  GLUCOSE 121* 140*  BUN 5* 6*  CREATININE 0.64 0.62  CALCIUM 8.5* 8.3*    COAG Lab Results  Component Value Date   INR 0.87 07/20/2018   No results found for: PTT  I have seen and evaluated the patient. I agree with the PA note as documented above. Continues to have palpable pulse in foot.  Some continued cellulitis on dorsum of foot and full thickness loss.  Do not think he can heal this at home.  Discussed L TMA Wednesday.  May try and add him on tomorrow if availability so NPO after midnight.  Patient agreeable.    Marty Heck, MD [facility] Office: [phone] Pager: [place]

## 2018-07-30 ENCOUNTER — Inpatient Hospital Stay (HOSPITAL_COMMUNITY): Payer: Medicare Other | Admitting: Certified Registered Nurse Anesthetist

## 2018-07-30 ENCOUNTER — Encounter (HOSPITAL_COMMUNITY)
Admission: EM | Disposition: A | Discharge status: Rehabilitation Facility | Payer: Self-pay | Source: Home / Self Care | Attending: Internal Medicine

## 2018-07-30 HISTORY — PX: TRANSMETATARSAL AMPUTATION: SHX6197

## 2018-07-30 LAB — GLUCOSE, CAPILLARY
Glucose-Capillary: 143 mg/dL — ABNORMAL HIGH (ref 70–99)
Glucose-Capillary: 109 mg/dL — ABNORMAL HIGH (ref 70–99)
Glucose-Capillary: 119 mg/dL — ABNORMAL HIGH (ref 70–99)
Glucose-Capillary: 116 mg/dL — ABNORMAL HIGH (ref 70–99)
Glucose-Capillary: 162 mg/dL — ABNORMAL HIGH (ref 70–99)

## 2018-07-30 SURGERY — AMPUTATION, FOOT, TRANSMETATARSAL
Anesthesia: Monitor Anesthesia Care | Site: Foot | Laterality: Left

## 2018-07-30 MED ORDER — POTASSIUM CHLORIDE IN NACL 20-0.9 MEQ/L-% IV SOLN
INTRAVENOUS | Status: DC
  Administered 2018-07-30: 09:00:00 via INTRAVENOUS
  Filled 2018-07-30: qty 1000

## 2018-07-30 MED ORDER — LACTATED RINGERS IV SOLN
INTRAVENOUS | Status: DC
  Administered 2018-07-30: 13:00:00 via INTRAVENOUS

## 2018-07-30 MED ORDER — PROPOFOL 500 MG/50ML IV EMUL
INTRAVENOUS | Status: DC | PRN
  Administered 2018-07-30: 75 ug/kg/min via INTRAVENOUS

## 2018-07-30 MED ORDER — MORPHINE SULFATE (PF) 2 MG/ML IV SOLN
2.0000 mg | INTRAVENOUS | Status: AC | PRN
  Administered 2018-07-31: 2 mg via INTRAVENOUS
  Filled 2018-07-30: qty 1

## 2018-07-30 MED ORDER — FENTANYL CITRATE (PF) 100 MCG/2ML IJ SOLN
100.0000 ug | Freq: Once | INTRAMUSCULAR | Status: AC
  Administered 2018-07-30: 100 ug via INTRAVENOUS

## 2018-07-30 MED ORDER — MIDAZOLAM HCL 2 MG/2ML IJ SOLN
INTRAMUSCULAR | Status: AC
  Administered 2018-07-30: 2 mg via INTRAVENOUS
  Filled 2018-07-30: qty 2

## 2018-07-30 MED ORDER — 0.9 % SODIUM CHLORIDE (POUR BTL) OPTIME
TOPICAL | Status: DC | PRN
  Administered 2018-07-30: 1000 mL

## 2018-07-30 MED ORDER — PHENYLEPHRINE HCL 10 MG/ML IJ SOLN
INTRAMUSCULAR | Status: DC | PRN
  Administered 2018-07-30: 120 ug via INTRAVENOUS

## 2018-07-30 MED ORDER — MIDAZOLAM HCL 2 MG/2ML IJ SOLN
2.0000 mg | Freq: Once | INTRAMUSCULAR | Status: AC
  Administered 2018-07-30: 2 mg via INTRAVENOUS

## 2018-07-30 MED ORDER — FENTANYL CITRATE (PF) 100 MCG/2ML IJ SOLN
INTRAMUSCULAR | Status: AC
  Administered 2018-07-30: 100 ug via INTRAVENOUS
  Filled 2018-07-30: qty 2

## 2018-07-30 MED ORDER — ONDANSETRON HCL 4 MG/2ML IJ SOLN
INTRAMUSCULAR | Status: DC | PRN
  Administered 2018-07-30: 4 mg via INTRAVENOUS

## 2018-07-30 SURGICAL SUPPLY — 35 items
BANDAGE ACE 4X5 VEL STRL LF (GAUZE/BANDAGES/DRESSINGS) ×3 IMPLANT
BANDAGE ELASTIC 3 VELCRO ST LF (GAUZE/BANDAGES/DRESSINGS) ×3 IMPLANT
BLADE AVERAGE 25MMX9MM (BLADE) ×1
BLADE AVERAGE 25X9 (BLADE) ×2 IMPLANT
BLADE SAW SGTL 81X20 HD (BLADE) IMPLANT
BNDG CONFORM 3 STRL LF (GAUZE/BANDAGES/DRESSINGS) IMPLANT
BNDG GAUZE ELAST 4 BULKY (GAUZE/BANDAGES/DRESSINGS) ×3 IMPLANT
CANISTER SUCT 3000ML PPV (MISCELLANEOUS) ×3 IMPLANT
COVER SURGICAL LIGHT HANDLE (MISCELLANEOUS) ×3 IMPLANT
COVER WAND RF STERILE (DRAPES) ×3 IMPLANT
DRAPE HALF SHEET 40X57 (DRAPES) ×3 IMPLANT
DRSG ADAPTIC 3X8 NADH LF (GAUZE/BANDAGES/DRESSINGS) ×3 IMPLANT
ELECT REM PT RETURN 9FT ADLT (ELECTROSURGICAL) ×3
ELECTRODE REM PT RTRN 9FT ADLT (ELECTROSURGICAL) ×1 IMPLANT
GAUZE SPONGE 4X4 12PLY STRL (GAUZE/BANDAGES/DRESSINGS) ×3 IMPLANT
GAUZE SPONGE 4X4 12PLY STRL LF (GAUZE/BANDAGES/DRESSINGS) ×3 IMPLANT
GLOVE BIO SURGEON STRL SZ7.5 (GLOVE) ×3 IMPLANT
GOWN STRL REUS W/ TWL LRG LVL3 (GOWN DISPOSABLE) ×2 IMPLANT
GOWN STRL REUS W/ TWL XL LVL3 (GOWN DISPOSABLE) ×1 IMPLANT
GOWN STRL REUS W/TWL LRG LVL3 (GOWN DISPOSABLE) ×4
GOWN STRL REUS W/TWL XL LVL3 (GOWN DISPOSABLE) ×2
KIT BASIN OR (CUSTOM PROCEDURE TRAY) ×3 IMPLANT
KIT TURNOVER KIT B (KITS) ×3 IMPLANT
NEEDLE HYPO 25GX1X1/2 BEV (NEEDLE) IMPLANT
NS IRRIG 1000ML POUR BTL (IV SOLUTION) ×3 IMPLANT
PACK GENERAL/GYN (CUSTOM PROCEDURE TRAY) ×3 IMPLANT
PAD ABD 8X10 STRL (GAUZE/BANDAGES/DRESSINGS) ×3 IMPLANT
PAD ARMBOARD 7.5X6 YLW CONV (MISCELLANEOUS) ×6 IMPLANT
SPECIMEN JAR SMALL (MISCELLANEOUS) ×3 IMPLANT
SUT ETHILON 3 0 PS 1 (SUTURE) ×3 IMPLANT
SYR CONTROL 10ML LL (SYRINGE) IMPLANT
TOWEL GREEN STERILE (TOWEL DISPOSABLE) ×6 IMPLANT
TOWEL GREEN STERILE FF (TOWEL DISPOSABLE) ×3 IMPLANT
UNDERPAD 30X30 (UNDERPADS AND DIAPERS) ×3 IMPLANT
WATER STERILE IRR 1000ML POUR (IV SOLUTION) ×3 IMPLANT

## 2018-07-30 NOTE — Progress Notes (Signed)
   Discussed with patient left tma and he agrees to proceed. No family currently available.   [name] C. Donzetta Matters, MD [facility] [place] Office: [phone] Pager: [phone]

## 2018-07-30 NOTE — Progress Notes (Signed)
[facility] [place]  Subjective  - No new complaints.   Objective (!) 145/65 80 99.3 F (37.4 C) (Oral) 20 93%  Intake/Output Summary (Last 24 hours) at 07/30/2018 0813 Last data filed at 07/30/2018 0440 Gross per 24 hour  Intake 200 ml  Output 400 ml  Net -200 ml    Left forefoot with ischemic changes around amputation site of great toe - looks full thickness Palpable DP pulse left foot Heart RRR Lungs non labored breathing   Assessment/Planning:  OR today for L TMA.  Still has palpable DP in LLE s/p revascularization but feel tissue loss on dorsum of foot looks full thickness after great toe amputation and best chance of healing will be TMA.  Surgery today with Dr. Donzetta Matters.   Marty Heck, MD [facility] Office: [phone] Pager: [place]

## 2018-07-30 NOTE — Plan of Care (Signed)
  Problem: Health Behavior/Discharge Planning: Goal: Ability to manage health-related needs will improve Outcome: Progressing   Problem: Clinical Measurements: Goal: Ability to maintain clinical measurements within normal limits will improve Outcome: Progressing   Problem: Activity: Goal: Risk for activity intolerance will decrease Outcome: Progressing   

## 2018-07-30 NOTE — Anesthesia Preprocedure Evaluation (Addendum)
Anesthesia Evaluation  Patient identified by MRN, date of birth, ID band Patient awake    Reviewed: Allergy & Precautions, NPO status , Patient's Chart, lab work & pertinent test results  History of Anesthesia Complications Negative for: history of anesthetic complications  Airway Mallampati: I  TM Distance: >3 FB Neck ROM: Full    Dental  (+) Poor Dentition, Missing, Loose   Pulmonary neg pulmonary ROS, Current Smoker,    breath sounds clear to auscultation       Cardiovascular hypertension, Pt. on medications + Peripheral Vascular Disease  Normal cardiovascular exam+ Valvular Problems/Murmurs   Echo 07/24/2018 - Left ventricle: The cavity size was normal. Wall thickness was increased in a pattern of mild LVH. Systolic function was normal. The estimated ejection fraction was in the range of 60% to 65%. Wall motion was normal; there were no regional wall motion abnormalities. Doppler parameters are consistent with abnormal left ventricular relaxation (grade 1 diastolic dysfunction). - Aortic valve: Moderately to severely calcified annulus.   Moderately thickened, moderately calcified leaflets. There was moderate to severe stenosis. Valve area (VTI): 1.06 cm^2. Valve area (Vmean): 1.02 cm^2. - Mitral valve: Mildly calcified annulus.    Neuro/Psych negative neurological ROS  negative psych ROS   GI/Hepatic negative GI ROS, Neg liver ROS,   Endo/Other  diabetes, Poorly Controlled, Type 2  Renal/GU negative Renal ROS     Musculoskeletal  (+) Arthritis ,   Abdominal   Peds  Hematology negative hematology ROS (+)   Anesthesia Other Findings   Reproductive/Obstetrics negative OB ROS                            Anesthesia Physical  Anesthesia Plan  ASA: III  Anesthesia Plan: MAC   Post-op Pain Management:    Induction: Intravenous  PONV Risk Score and Plan: 2 and Ondansetron and  Dexamethasone  Airway Management Planned: Natural Airway and Simple Face Mask  Additional Equipment:   Intra-op Plan:   Post-operative Plan:   Informed Consent: I have reviewed the patients History and Physical, chart, labs and discussed the procedure including the risks, benefits and alternatives for the proposed anesthesia with the patient or authorized representative who has indicated his/her understanding and acceptance.   Dental advisory given  Plan Discussed with: CRNA, Anesthesiologist and Surgeon  Anesthesia Plan Comments:        Anesthesia Quick Evaluation

## 2018-07-30 NOTE — Progress Notes (Signed)
Consulted to bedside for midline placement. Patient with vancomycin orders for unknown length of time. Patient with newly placed PIV, +blood return, infusing without difficulty. Denies pain. If more stable access needed for vancomycin administration PICC most appropriate device. Bedside nurse updated.

## 2018-07-30 NOTE — Op Note (Signed)
    Patient name: John Cihlar Sr. MRN: [id number] DOB: Oct 17, 1950 Sex: male  07/30/2018 Pre-operative Diagnosis: Critical left lower extremity ischemia Post-operative diagnosis:  Same Surgeon:  Erlene Quan C. Donzetta Matters, MD Procedure Performed:  Transmetatarsal amputation left foot  Indications: 67 year old male presented with acute on chronic left lower extremity ischemia.  He is undergone left lower extremity revascularization with palpable dorsalis pedis pulse the foot.  He is also undergone left great toe amputation which has now failed to heal and he is indicated for more proximal amputation.  Findings: The medial aspect of the plantar flap appeared nonviable initially.  We took this back past the metatarsal bones.  We did have marginally viable tissue and the wound was closed.   Procedure:  The patient was identified in the holding area and taken to the operating room where is placed supine on the operating table MAC anesthesia was induced.  A left lower extremity blockage previously been placed.  He was sterilely prepped and draped in left lower extremity antibiotics were administered and a timeout was called.  We began with a fishmouth type incision overlying the metatarsal bones.  We dissected back initially transected the metatarsal bones.  Unfortunately the medial aspect of the plantar flap did not appear viable we began taking this further back until viable tissue was encountered.  This required removing back to the level of the tarsometatarsal joint.  We used a rondure on the joint capsule smooth this with rasp.  Hemostasis was obtained and we thoroughly irrigated the wound and closed the skin with interrupted 3-0 nylon sutures.  He was then allowed away from anesthesia having tolerated procedure well without immediate complication.  All counts were correct at completion.    EBL 25 cc    John Risden C. Donzetta Matters, MD [facility] [place] Office: [phone] Pager:  [phone]

## 2018-07-30 NOTE — Anesthesia Postprocedure Evaluation (Signed)
Anesthesia Post Note  Patient: John Laurence Sr.  Procedure(s) Performed: TRANSMETATARSAL AMPUTATION (Left Foot)     Patient location during evaluation: PACU Anesthesia Type: Regional and MAC Level of consciousness: awake and alert Pain management: pain level controlled Vital Signs Assessment: post-procedure vital signs reviewed and stable Respiratory status: spontaneous breathing, nonlabored ventilation and respiratory function stable Cardiovascular status: stable and blood pressure returned to baseline Postop Assessment: no apparent nausea or vomiting Anesthetic complications: no    Last Vitals:  Vitals:   07/30/18 1515 07/30/18 1530  BP: 116/69 123/75  Pulse: 68 67  Resp: 18 20  Temp:  36.4 C  SpO2: 100% 100%    Last Pain:  Vitals:   07/30/18 1530  TempSrc:   PainSc: 0-No pain    LLE Motor Response: Purposeful movement;Responds to commands(d/t block ) (07/30/18 1530) LLE Sensation: Decreased;Numbness (07/30/18 1530)          Demetrios Byron,W. EDMOND

## 2018-07-30 NOTE — Progress Notes (Signed)
Responded to PIV consult. PIV present on arrival and RN states no longer needs consult.

## 2018-07-30 NOTE — Anesthesia Procedure Notes (Signed)
Anesthesia Regional Block: Popliteal block   Pre-Anesthetic Checklist: ,, timeout performed, Correct Patient, Correct Site, Correct Laterality, Correct Procedure, Correct Position, site marked, Risks and benefits discussed,  Surgical consent,  Pre-op evaluation,  At surgeon's request and post-op pain management  Laterality: Left  Prep: chloraprep       Needles:  Injection technique: Single-shot  Needle Type: Echogenic Stimulator Needle          Additional Needles:   Narrative:  Start time: 07/30/2018 12:58 PM End time: 07/30/2018 1:08 PM Injection made incrementally with aspirations every 5 mL.  Performed by: Personally  Anesthesiologist: Duane Boston, MD  Additional Notes: A functioning IV was confirmed and monitors were applied.  Sterile prep and drape, hand hygiene and sterile gloves were used.  Negative aspiration and test dose prior to incremental administration of local anesthetic. The patient tolerated the procedure well.Ultrasound  guidance: relevant anatomy identified, needle position confirmed, local anesthetic spread visualized around nerve(s), vascular puncture avoided.  Image printed for medical record. ACB supplementation.

## 2018-07-30 NOTE — Progress Notes (Signed)
PROGRESS NOTE    John Lafauci Sr.  ZOX:[id number] DOB: Aug 11, 1951 DOA: 07/19/2018 PCP: Patient, No Pcp Per   Brief Narrative: [name]  is a 67 y.o. male, w dm2, who c/o left foot pain x 3 months with tip of 1st digit left foot black and discolored.  Pt apparently presented to ED due to redness on the dorsum of the left foot. Pt notes trauma about 18months ago when he dropped something on his foot.  Pt denies fever, chills, cp, palp, sob, n/v, diarrhea, brbpr.  Pt can't afford diabetic medication but can apparently afford cigarettes.  He came to the hospital he was diagnosed with left first toe gangrene highly suspicious for ischemic injury, vascular surgery was consulted and he was placed on empiric antibiotics along with heparin drip.   Subjective -  Patient in bed, appears comfortable, denies any headache, no fever, no chest pain or pressure, no shortness of breath , no abdominal pain. No focal weakness. Mild L foot pain.   Assessment & Plan:   1. Left LE  Chronic ischemia, first toe gangrene with some surrounding cellulitis.  Continue with IV antibiotics. Cultures negative, started on statin, aspirin and Plavix, was also on heparin drip, underwent angiogram on 07/24/2018 by vascular surgery with  left common iliac artery stent placement, left SFA above/knee popliteal artery occlusion balloon angioplasty with DES stent placement, he has subsequently undergone left foot first toe ray amputation on 07/25/2018, vascular surgery now planning to do left transmetatarsal amputation today on 07/30/18, gentle IVF while NPO, surrounding cellulitis is improving, will continue IV antibiotics till surgery is done thereafter possible oral doxycycline for another 3 to 4 days.  2. HTN; added on Norvasc + PO hydralazine  for better control.  3. DM 2; SSI; check HBA1c. 7.4 - DM & Insulin education ordered.  CBG (last 3)  Recent Labs    07/29/18 1633 07/29/18 2134 07/30/18 0634  GLUCAP 149* 193*  143*    4. Tobacco use; counseling provided.   5. Hypokalemia; replaced will monitor.  6.  Loud harsh systolic murmur mostly in the aortic valve area.  Echocardiogram noted which shows a preserved EF of 60 to 65% with moderate to severe aortic stenosis, outpatient follow-up with cardiology and cardiothoracic surgery, hemodynamically he is compensated this is chronic.    DVT prophylaxis: heparin  Code Status:  Full code.  Family Communication: family at bedside.  Disposition Plan: remain inpatient for IV antibiotics and heparin gtt  Consultants:   Vascular.    Procedures:     Angiogram - 07/24/18 -this post left common iliac artery stent placement, left SFA above/knee popliteal artery occlusion balloon angioplasty with DES stent placement.  -  Left foot first toe ray amputation on 08/24/2018.   TTE -  - Left ventricle: The cavity size was normal. Wall thickness was increased in a pattern of mild LVH. Systolic function was normal. The estimated ejection fraction was in the range of 60% to 65%.  Wall motion was normal; there were no regional wall motion abnormalities. Doppler parameters are consistent with abnormal left ventricular relaxation (grade 1 diastolic dysfunction). - Aortic valve: Moderately to severely calcified annulus.  Moderately thickened, moderately calcified leaflets. There was moderate to severe stenosis. Valve area (VTI): 1.06 cm^2. Valve  area (Vmean): 1.02 cm^2. - Mitral valve: Mildly calcified annulus.    Antimicrobials:   Vancomycin  cefepime   Objective:  Blood pressure (!) 144/79, pulse 91, temperature 99.2 F (37.3 C), temperature source Oral, resp.  rate 17, height 5' 10.98" (1.803 m), weight 71.6 kg, SpO2 94 %.   Examination:   Awake Alert, Oriented X 3, No new F.N deficits, Normal affect [place].AT,PERRAL Supple Neck,No JVD, No cervical lymphadenopathy appriciated.  Symmetrical Chest wall movement, Good air movement bilaterally, CTAB RRR,No  Gallops, Rubs or new Murmurs, No Parasternal Heave +ve B.Sounds, Abd Soft, No tenderness, No organomegaly appriciated, No rebound - guarding or rigidity. No Cyanosis, Clubbing or edema, left foot post 1st toe ray amputation under bandage with some surrounding cellulitis.   Data Reviewed: I have personally reviewed following labs and imaging studies  CBC:  Recent Labs  Lab 07/24/18 0441 07/25/18 0342 07/26/18 0606 07/29/18 0421  WBC 6.4 6.8 10.0 8.6  HGB 14.0 14.2 14.6 11.6*  HCT 43.0 43.3 43.9 34.8*  MCV 89.4 89.3 90.1 89.0  PLT 189 193 216 009   Basic Metabolic Panel:  Recent Labs  Lab 07/24/18 0441 07/26/18 0606 07/27/18 0356 07/28/18 0340 07/29/18 0421  NA 138 134* 132* 135 136  K 3.7 3.4* 4.5 3.3* 3.3*  CL 108 101 101 98 104  CO2 22 27 28 26 27   GLUCOSE 116* 123* 156* 121* 140*  BUN 6* 7* 7* 5* 6*  CREATININE 0.55* 0.75 0.71 0.64 0.62  CALCIUM 8.8* 9.1 8.9 8.5* 8.3*  MG  --  1.8  --   --  2.0   GFR: Estimated Creatinine Clearance: 89.3 mL/min (by C-G formula based on SCr of 0.62 mg/dL). Liver Function Tests: No results for input(s): AST, ALT, ALKPHOS, BILITOT, PROT, ALBUMIN in the last 168 hours. No results for input(s): LIPASE, AMYLASE in the last 168 hours. No results for input(s): AMMONIA in the last 168 hours. Coagulation Profile: No results for input(s): INR, PROTIME in the last 168 hours. Cardiac Enzymes: No results for input(s): CKTOTAL, CKMB, CKMBINDEX, TROPONINI in the last 168 hours. BNP (last 3 results) No results for input(s): PROBNP in the last 8760 hours. HbA1C: No results for input(s): HGBA1C in the last 72 hours. CBG: Recent Labs  Lab 07/29/18 0613 07/29/18 1115 07/29/18 1633 07/29/18 2134 07/30/18 0634  GLUCAP 151* 163* 149* 193* 143*   Lipid Profile: No results for input(s): CHOL, HDL, LDLCALC, TRIG, CHOLHDL, LDLDIRECT in the last 72 hours. Thyroid Function Tests: No results for input(s): TSH, T4TOTAL, FREET4, T3FREE, THYROIDAB  in the last 72 hours. Anemia Panel: No results for input(s): VITAMINB12, FOLATE, FERRITIN, TIBC, IRON, RETICCTPCT in the last 72 hours. Sepsis Labs: No results for input(s): PROCALCITON, LATICACIDVEN in the last 168 hours.  No results found for this or any previous visit (from the past 240 hour(s)).   Radiology Studies:  No results found.  Scheduled Meds: . amLODipine  10 mg Oral Daily  . aspirin EC  81 mg Oral Daily  . atorvastatin  20 mg Oral q1800  . carvedilol  6.25 mg Oral BID WC  . clopidogrel  75 mg Oral Q breakfast  . hydrALAZINE  50 mg Oral Q8H  . insulin aspart  0-9 Units Subcutaneous TID WC  . nicotine  14 mg Transdermal Daily   Continuous Infusions: . sodium chloride 10 mL/hr at 07/26/18 0117  . 0.9 % NaCl with KCl 20 mEq / L 75 mL/hr at 07/30/18 0830  . ceFEPime (MAXIPIME) IV 2 g (07/30/18 0541)  . vancomycin 166.7 mL/hr at 07/30/18 0109     LOS: 9 days   Time spent: 35 minutes.   Signature  Lala Lund M.D on 07/30/2018 at 10:00 AM   -  To page go to [email or web address] - password [facility]

## 2018-07-30 NOTE — Anesthesia Procedure Notes (Signed)
Procedure Name: MAC Date/Time: 07/30/2018 1:42 PM Performed by: Candis Shine, CRNA Pre-anesthesia Checklist: Patient identified, Emergency Drugs available, Suction available, Timeout performed and Patient being monitored Patient Re-evaluated:Patient Re-evaluated prior to induction Oxygen Delivery Method: Simple face mask Dental Injury: Teeth and Oropharynx as per pre-operative assessment

## 2018-07-30 NOTE — Transfer of Care (Signed)
Immediate Anesthesia Transfer of Care Note  Patient: John Laurence Sr.  Procedure(s) Performed: TRANSMETATARSAL AMPUTATION (Left Foot)  Patient Location: PACU  Anesthesia Type:MAC and Regional  Level of Consciousness: drowsy  Airway & Oxygen Therapy: Patient Spontanous Breathing and Patient connected to nasal cannula oxygen  Post-op Assessment: Report given to RN and Post -op Vital signs reviewed and stable  Post vital signs: Reviewed and stable  Last Vitals:  Vitals Value Taken Time  BP 90/63 07/30/2018  2:48 PM  Temp    Pulse 69 07/30/2018  2:50 PM  Resp 19 07/30/2018  2:50 PM  SpO2 92 % 07/30/2018  2:50 PM  Vitals shown include unvalidated device data.  Last Pain:  Vitals:   07/30/18 1059  TempSrc: Oral  PainSc:       Patients Stated Pain Goal: 3 (76/19/50 9326)  Complications: No apparent anesthesia complications

## 2018-07-31 ENCOUNTER — Encounter (HOSPITAL_COMMUNITY): Payer: Self-pay | Admitting: Vascular Surgery

## 2018-07-31 LAB — CBC
HCT: 36.9 % — ABNORMAL LOW (ref 39.0–52.0)
HEMOGLOBIN: 12.2 g/dL — AB (ref 13.0–17.0)
MCH: 29.4 pg (ref 26.0–34.0)
MCHC: 33.1 g/dL (ref 30.0–36.0)
MCV: 88.9 fL (ref 80.0–100.0)
Platelets: 301 10*3/uL (ref 150–400)
RBC: 4.15 MIL/uL — ABNORMAL LOW (ref 4.22–5.81)
RDW: 12.6 % (ref 11.5–15.5)
WBC: 13.1 10*3/uL — AB (ref 4.0–10.5)
nRBC: 0 % (ref 0.0–0.2)

## 2018-07-31 LAB — BASIC METABOLIC PANEL
Anion gap: 8 (ref 5–15)
BUN: 8 mg/dL (ref 8–23)
CO2: 25 mmol/L (ref 22–32)
CREATININE: 0.77 mg/dL (ref 0.61–1.24)
Calcium: 8.4 mg/dL — ABNORMAL LOW (ref 8.9–10.3)
Chloride: 99 mmol/L (ref 98–111)
GFR calc non Af Amer: >60 mL/min (ref >60)
Glucose, Bld: 220 mg/dL — ABNORMAL HIGH (ref 70–99)
Potassium: 3.9 mmol/L (ref 3.5–5.1)
Sodium: 132 mmol/L — ABNORMAL LOW (ref 135–145)

## 2018-07-31 LAB — GLUCOSE, CAPILLARY
GLUCOSE-CAPILLARY: 178 mg/dL — AB (ref 70–99)
Glucose-Capillary: 182 mg/dL — ABNORMAL HIGH (ref 70–99)
Glucose-Capillary: 179 mg/dL — ABNORMAL HIGH (ref 70–99)
Glucose-Capillary: 159 mg/dL — ABNORMAL HIGH (ref 70–99)

## 2018-07-31 LAB — MAGNESIUM: Magnesium: 1.8 mg/dL (ref 1.7–2.4)

## 2018-07-31 LAB — PROTIME-INR
INR: 1
PROTHROMBIN TIME: 13.1 s (ref 11.4–15.2)

## 2018-07-31 MED ORDER — METFORMIN HCL 500 MG PO TABS
500.0000 mg | ORAL_TABLET | Freq: Two times a day (BID) | ORAL | Status: DC
  Administered 2018-07-31 – 2018-08-05 (×9): 500 mg via ORAL
  Filled 2018-07-31 (×9): qty 1

## 2018-07-31 MED ORDER — ENOXAPARIN SODIUM 40 MG/0.4ML ~~LOC~~ SOLN: 40.0000 mg | Freq: Two times a day (BID) | SUBCUTANEOUS | Status: DC

## 2018-07-31 MED ORDER — ENOXAPARIN SODIUM 40 MG/0.4ML ~~LOC~~ SOLN
40.0000 mg | SUBCUTANEOUS | Status: DC
  Administered 2018-07-31 – 2018-08-05 (×5): 40 mg via SUBCUTANEOUS
  Filled 2018-07-31 (×5): qty 0.4

## 2018-07-31 NOTE — Progress Notes (Signed)
PT Cancellation Note  Patient Details Name: John Blume Sr. MRN: [id number] DOB: 09-Feb-1951   Cancelled Treatment:    Reason Eval/Treat Not Completed: Fatigue/lethargy limiting ability to participate.  Will f/u per POC.    Michel Santee 07/31/2018, 3:43 PM

## 2018-07-31 NOTE — Progress Notes (Signed)
[facility] HOSPITALIST PROGRESS NOTE  John Banton Sr. KGU:[id number] DOB: [birth year] DOA: 07/19/2018 PCP: Patient, No Pcp Per   Narrative: 67 year old male DM TY 2 Former smoker, daily drinker Prior history of accidental fall, seventh rib fracture 02/2008 Admitted initially [facility] 07/20/2018 3 months h/o first digit discoloration-possible PAD-apparently he dropped from his left midfoot and had some pain  Sent over to [facility] for evaluation by vascular surgery-placed on IV heparin-found to have critical limb ischemia left lower extremity with tissue loss monophasic dorsalis pedis-underwent arteriogram and left knee surgery consulted on him  A & Plan Severe peripheral arterial disease status post great toe gangrene-angiogram 07/25/2018 performed-left common iliac stent, left SFA popliteal angioplasty stent--was placed on antibiotics-underwent subsequently transmetatarsal amputation secondary to gangrenous changes of amputation site on 11/5 Defer to surgery based on cleanliness of the wound and depth of infection duration of antibiotics-currently is on cefepime and Vanc Pain control currently is tramadol 50 every 6 as needed, OxyIR 5-10 every 4 as needed moderate pain and morphine 2 mg every 2 as needed as needed  Diabetes mellitus type 2-A1c 7.4 CBG ranging between 162-220-cont sensi SSI and will add low dose lantus 5 U qhs while in hosp as the patient is not on any home medications will need some control of the same as an outpatient and will start metformin 500 daily XL and then titrate to twice daily or have outpatient physician titrate the same  Hypertension prior to admission on no meds this admission started on amlodipine 10 mg Coreg 6.25 twice daily and hydralazine 50 every 8-continue PRN labetalol every 8 as needed discontinuing PRN hydralazine  Leukocytosis-likely secondary to postop state-we will repeat in a.m. to ensure no fever curve in addition  Hyperlipidemia  continue the 20 daily  Chronic daily smoker patient has quit since the past 3 weeks  Chronic daily drinker stable at this time  Severe malnutrition BMI 21-outpatient management and monitoring    Patient is nearing discharge I have explained to him the need for control of his blood pressure diabetes and general health maintenance he will need a PCP and I will ask case management to see him  DVT prophylaxis: Lovenox code Status: Full family Communication: None disposition Plan: Inpatient   Dailen Mcclish, MD  [facility] Direct contact: [phone] --Via amion app OR  --[email or web address]; password [email or web address]  7PM-7AM contact night coverage as above 07/31/2018, 8:38 AM  LOS: 10 days   Consultants:  Vascular  Procedures:  Now-multiple surgeries  Antimicrobials:  Vancomycin and ceftriaxone  Interval history/Subjective: Awake and alert has been up out of bed to some degree No overt pain states pain is now 7/10 as compared to prior to surgery which was 9/10 No fever no chills Eating and drinking   Objective:  Vitals:  Vitals:   07/31/18 0634 07/31/18 0832  BP:  140/85  Pulse:  88  Resp:    Temp: 99.1 F (37.3 C)   SpO2:      Exam:  Awake alert pleasant no distress no pallor no icterus Chest is clinically clear no added sound S1-S2 no murmur rub or gallop Abdomen is soft no rebound no guarding No lower extremity edema Neurologically is intact without deficit Power is 5/5 Psych is euthymic  I have personally reviewed the following:   Labs:  Sodium 132 white count is up from 8.6-13.1  Imaging studies:  n  Medical tests:  n   Test discussed with performing physician:  n  Decision to obtain  old records:  n  Review and summation of old records:  y  Scheduled Meds: . amLODipine  10 mg Oral Daily  . aspirin EC  81 mg Oral Daily  . atorvastatin  20 mg Oral q1800  . carvedilol  6.25 mg Oral BID WC  . clopidogrel  75 mg Oral Q breakfast  .  hydrALAZINE  50 mg Oral Q8H  . insulin aspart  0-9 Units Subcutaneous TID WC  . nicotine  14 mg Transdermal Daily   Continuous Infusions: . sodium chloride 10 mL/hr at 07/26/18 0117  . ceFEPime (MAXIPIME) IV 2 g (07/31/18 0355)  . lactated ringers 50 mL/hr at 07/30/18 1241  . vancomycin 1,250 mg (07/31/18 0022)    Principal Problem:   PAD (peripheral artery disease) (HCC) Active Problems:   Cellulitis in diabetic foot ([place])   Diabetes mellitus, type II ([place])   Cellulitis   Dry gangrene ([place])   LOS: 10 days

## 2018-07-31 NOTE — Progress Notes (Signed)
Patient took the dressing off on the incision on the left lower feet. Nurse advise patient not remove but patient removed it but he denies doing it deliberately. This nurse reinforced the dressing.The incision has mild bleeding. Patient also has fever of 100.6 an Tylenol 650 mg given. Will recheck .

## 2018-07-31 NOTE — Progress Notes (Addendum)
Occupational Therapy Treatment Patient Details Name: John Kozma Sr. MRN: [id number] DOB: [birth year] Today's Date: 07/31/2018    History of present illness 67 yo admitted with necrotic left toe s/p revascularization Pt 10/30 and ray amputation 10/31. 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 PMhx: HTN, DM, arthritis   OT comments  Pt progressing towards acute OT goals. Focus of session was LB dressing and toilet transfers. Pt using rw during session and with 2-3 LOB needing min A to steady. Pt reporting sharp pain in L heel during ambulation, "It feels like they're tacks in there." Pt also reporting dizziness with onset of last night that does not alter with positional changes per his report. Of note, pt mentioned his wife works during the day. Need to confirm assist level at home. Otherwise, d/c plan remains appropriate.    Follow Up Recommendations  Home health OT;Supervision/Assistance - 24 hour    Equipment Recommendations  3 in 1 bedside commode    Recommendations for Other Services      Precautions / Restrictions Precautions Precautions: Fall Precaution Comments: LLE WB through heel in Darco shoe only Required Braces or Orthoses: Other Brace/Splint(Darco shoe) Restrictions Weight Bearing Restrictions: Yes LLE Weight Bearing: Weight bearing as tolerated Other Position/Activity Restrictions: WBAT in Darco(WB through heel)       Mobility Bed Mobility               General bed mobility comments: up in chair  Transfers Overall transfer level: Needs assistance Equipment used: Rolling walker (2 wheeled)   Sit to Stand: Min guard;Min assist         General transfer comment: steadying assist. cues for technique with rw.    Balance Overall balance assessment: Needs assistance Sitting-balance support: No upper extremity supported;Feet supported Sitting balance-Leahy Scale: Good     Standing balance support: No upper extremity supported;During  functional activity Standing balance-Leahy Scale: Poor                             ADL either performed or assessed with clinical judgement   ADL Overall ADL's : Needs assistance/impaired                         Toilet Transfer: Minimal assistance;Ambulation;BSC;RW Toilet Transfer Details (indicate cue type and reason): 3n1 over toilet. simulated with walking to/from bathroom and recliner transfer. Min A for LOB on turns. Pt reporting increased pain in L heel during ambulation         Functional mobility during ADLs: Minimal assistance;Rolling walker General ADL Comments: Practiced donning/doffing Darco shoe with pt.  Pt walked to/from bathroom with rw with 2x LOB during turns needing min A to steady. Pt relying on rw this session for balance and comfort 2/2 L heel pain.     Vision       Perception     Praxis      Cognition Arousal/Alertness: Awake/alert Behavior During Therapy: WFL for tasks assessed/performed Overall Cognitive Status: Within Functional Limits for tasks assessed                                          Exercises     Shoulder Instructions       General Comments Pt reporting dizziness with onset of last night. Does not appear to alter with position  changes. Nursing notified.     Pertinent Vitals/ Pain       Pain Assessment: Faces Faces Pain Scale: Hurts whole lot Pain Location: heel of L foot with WB "it feels like tacks in my heel" Pain Descriptors / Indicators: Stabbing;Guarding;Grimacing;Moaning Pain Intervention(s): Limited activity within patient's tolerance;Monitored during session;Repositioned  Home Living                                          Prior Functioning/Environment              Frequency  Min 2X/week        Progress Toward Goals  OT Goals(current goals can now be found in the care plan section)  Progress towards OT goals: Progressing toward goals  Acute  Rehab OT Goals OT Goal Formulation: With patient Time For Goal Achievement: 08/02/18 Potential to Achieve Goals: Good ADL Goals Pt Will Perform Lower Body Bathing: with modified independence;sit to/from stand Pt Will Perform Lower Body Dressing: with modified independence;sit to/from stand Pt Will Transfer to Toilet: with modified independence;ambulating Pt Will Perform Toileting - Clothing Manipulation and hygiene: with modified independence;sit to/from stand  Plan Discharge plan remains appropriate    Co-evaluation                 AM-PAC PT "6 Clicks" Daily Activity     Outcome Measure   Help from another person eating meals?: None Help from another person taking care of personal grooming?: A Little Help from another person toileting, which includes using toliet, bedpan, or urinal?: A Little Help from another person bathing (including washing, rinsing, drying)?: A Little Help from another person to put on and taking off regular upper body clothing?: None Help from another person to put on and taking off regular lower body clothing?: A Little 6 Click Score: 20    End of Session Equipment Utilized During Treatment: Rolling walker;Other (comment)(Darco shoe)  OT Visit Diagnosis: Unsteadiness on feet (R26.81);Pain;Other abnormalities of gait and mobility (R26.89) Pain - Right/Left: Left Pain - part of body: Ankle and joints of foot   Activity Tolerance Other (comment);Patient limited by pain(ongoing dizziness since last night per pt report)   Patient Left in chair;with call bell/phone within reach   Nurse Communication Mobility status;Other (comment)(dizziness and pain in heel with WB )        Time: 4132-4401 OT Time Calculation (min): 18 min  Charges: OT General Charges $OT Visit: 1 Visit OT Treatments $Self Care/Home Management : 8-22 mins  Tyrone Schimke, OT Acute Rehabilitation Services Pager: [phone] Office: [phone]    Hortencia Pilar 07/31/2018, 9:29 AM

## 2018-07-31 NOTE — Care Management Note (Signed)
Case Management Note Marvetta Gibbons RN, BSN Transitions of Care Unit 4E- RN Case Manager [phone]  Patient Details  Name: John Mcmanaway Sr. MRN: [id number] Date of Birth: 15-Jun-1951  Subjective/Objective:   Pt admitted with ischemic foot great toe gangrene-angiogram 07/25/2018 performed-left common iliac stent, left SFA popliteal angioplasty stent--was placed on antibiotics- ultimately s/p TMA per vascular              Action/Plan: PTA pt lived at home with wife, per PT/OT evals post TMA- recommendations for HHPT/OT- DME- RW and 3n1- CM spoke with pt at bedside regarding transition of care needs- per pt he does not have insurance or PCP. Discussed charity care with [facility]- pt is agreeable to having [facility] assess him for charity care eligibility for [facility] and DME needs. Referral has been called to Butch Penny with [facility] who will come speak with pt. Pt will need PCP in [place] [facility] and HD- [facility]- call made for f/u appointment- however pt will need to call Clinic to speak with their Care Connects for screening before they will give pt an appointment info placed on AVS and will also share with pt. CM will follow for further transition of care needs and possible medication assistance.   Expected Discharge Date:                  Expected Discharge Plan:  [name]  In-House Referral:     Discharge [facility], Medication Assistance  Post Acute Care Choice:  [place], Durable Medical Equipment Choice offered to:  Patient  DME Arranged:  3-N-1, Walker rolling DME Agency:  [place]:    [place] Agency:  [place]  Status of Service:  In process, will continue to follow  If discussed at Long Length of Stay Meetings, dates discussed:    Discharge Disposition:   Additional Comments:  [name], RN 07/31/2018, 3:50 PM

## 2018-07-31 NOTE — Progress Notes (Addendum)
  Progress Note    07/31/2018 7:52 AM 1 Day Post-Op  Subjective:  Says he is having pain but surgical pain-not the same as pre op pain  Tm 100.6 now 99.1  Vitals:   07/31/18 0400 07/31/18 0634  BP: (!) 164/80   Pulse: 84   Resp:    Temp: (!) 100.6 F (38.1 C) 99.1 F (37.3 C)  SpO2: 98%     Physical Exam: General:  No distress Lungs:  Non labored Incisions:      CBC    Component Value Date/Time   WBC 13.1 (H) 07/31/2018 0312   RBC 4.15 (L) 07/31/2018 0312   HGB 12.2 (L) 07/31/2018 0312   HCT 36.9 (L) 07/31/2018 0312   PLT 301 07/31/2018 0312   MCV 88.9 07/31/2018 0312   MCH 29.4 07/31/2018 0312   MCHC 33.1 07/31/2018 0312   RDW 12.6 07/31/2018 0312   LYMPHSABS 2.5 07/20/2018 0039   MONOABS 0.7 07/20/2018 0039   EOSABS 0.2 07/20/2018 0039   BASOSABS 0.1 07/20/2018 0039    BMET    Component Value Date/Time   NA 132 (L) 07/31/2018 0312   K 3.9 07/31/2018 0312   CL 99 07/31/2018 0312   CO2 25 07/31/2018 0312   GLUCOSE 220 (H) 07/31/2018 0312   BUN 8 07/31/2018 0312   CREATININE 0.77 07/31/2018 0312   CALCIUM 8.4 (L) 07/31/2018 0312   GFRNONAA >60 07/31/2018 0312   GFRAA >60 07/31/2018 0312    INR    Component Value Date/Time   INR 1.00 07/31/2018 0312     Intake/Output Summary (Last 24 hours) at 07/31/2018 0752 Last data filed at 07/31/2018 0024 Gross per 24 hour  Intake 333.75 ml  Output 890 ml  Net -556.25 ml     Assessment:  67 y.o. male is s/p:  Transmetatarsal amputation left foot  1 Day Post-Op  Plan: -incision looks good-there was some bloody drainage on the bandage.  Re-dressed this am. -use Darco shoe to mobilize.  Heel weight bearing only    Leontine Locket, PA-C [facility] [phone] 07/31/2018 7:52 AM   I have seen and evaluated the patient. I agree with the PA note as documented above. L TMA looks ok today.  Will closely monitor.  If ok tomorrow may allow d/c home with close interval followup.    Marty Heck, MD [facility] [place] Office: [phone] Pager: [phone]

## 2018-08-01 LAB — CBC WITH DIFFERENTIAL/PLATELET
ABS IMMATURE GRANULOCYTES: 0.11 10*3/uL — AB (ref 0.00–0.07)
BASOS ABS: 0.1 10*3/uL (ref 0.0–0.1)
BASOS PCT: 1 %
EOS PCT: 1 %
Eosinophils Absolute: 0.1 10*3/uL (ref 0.0–0.5)
HEMATOCRIT: 33.2 % — AB (ref 39.0–52.0)
Hemoglobin: 11.1 g/dL — ABNORMAL LOW (ref 13.0–17.0)
Immature Granulocytes: 1 %
Lymphocytes Relative: 18 %
Lymphs Abs: 2.4 10*3/uL (ref 0.7–4.0)
MCH: 29.5 pg (ref 26.0–34.0)
MCHC: 33.4 g/dL (ref 30.0–36.0)
MCV: 88.3 fL (ref 80.0–100.0)
MONO ABS: 1.7 10*3/uL — AB (ref 0.1–1.0)
Monocytes Relative: 13 %
NRBC: 0 % (ref 0.0–0.2)
Neutro Abs: 9 10*3/uL — ABNORMAL HIGH (ref 1.7–7.7)
Neutrophils Relative %: 66 %
Platelets: 296 10*3/uL (ref 150–400)
RBC: 3.76 MIL/uL — ABNORMAL LOW (ref 4.22–5.81)
RDW: 12.7 % (ref 11.5–15.5)
WBC: 13.3 10*3/uL — AB (ref 4.0–10.5)

## 2018-08-01 LAB — BASIC METABOLIC PANEL
ANION GAP: 8 (ref 5–15)
BUN: 6 mg/dL — AB (ref 8–23)
CALCIUM: 8.4 mg/dL — AB (ref 8.9–10.3)
CO2: 27 mmol/L (ref 22–32)
Chloride: 97 mmol/L — ABNORMAL LOW (ref 98–111)
Creatinine, Ser: 0.71 mg/dL (ref 0.61–1.24)
GFR calc Af Amer: >60 mL/min (ref >60)
GFR calc non Af Amer: >60 mL/min (ref >60)
GLUCOSE: 166 mg/dL — AB (ref 70–99)
Potassium: 3.4 mmol/L — ABNORMAL LOW (ref 3.5–5.1)
Sodium: 132 mmol/L — ABNORMAL LOW (ref 135–145)

## 2018-08-01 LAB — GLUCOSE, CAPILLARY
GLUCOSE-CAPILLARY: 176 mg/dL — AB (ref 70–99)
Glucose-Capillary: 160 mg/dL — ABNORMAL HIGH (ref 70–99)
Glucose-Capillary: 120 mg/dL — ABNORMAL HIGH (ref 70–99)

## 2018-08-01 MED ORDER — SULFAMETHOXAZOLE-TRIMETHOPRIM 800-160 MG PO TABS: 1.0000 | ORAL_TABLET | Freq: Two times a day (BID) | ORAL | Status: DC

## 2018-08-01 NOTE — Progress Notes (Signed)
Pharmacy Antibiotic Note  John Juhasz Sr. is a 67 y.o. male on day # 13 Vancomycin and Cefepime for cellulitis and dry gangrene. S/p LLE revascularization on 10/30 and left great toe amputation on 10/31, then left transmetatarsal amputation on 11/5.      Last Vancomycin trough level 12 mcg/ml on 11/1 (goal 15-20 mcg/ml).   Renal function stable.  Tmax 100.2 on 11/6 am, most recent afebrile. WBC up 13.3, thought due to post-op state.   Plan:  Continue Vancomycin 1250 mg IV q12hrs  Continue Cefepime 2gm IV q12hrs  Noted plan for one more day IV antibiotics then home on Bactrim x 1 week.  Height: (charted wrong pt.) Weight: 153 lb 10.6 oz (69.7 kg) IBW/kg (Calculated) : 75.26  Temp (24hrs), Avg:99.3 F (37.4 C), Min:98.2 F (36.8 C), Max:100.2 F (37.9 C)  Recent Labs  Lab 07/26/18 0606 07/26/18 1157 07/27/18 0356 07/28/18 0340 07/29/18 0421 07/31/18 0312 08/01/18 0318  WBC 10.0  --   --   --  8.6 13.1* 13.3*  CREATININE 0.75  --  0.71 0.64 0.62 0.77 0.71  VANCOTROUGH  --  12*  --   --   --   --   --     Estimated Creatinine Clearance: 89.5 mL/min (by C-G formula based on SCr of 0.71 mg/dL).    Allergies  Allergen Reactions  . Tea Itching and Rash    Antimicrobials this admission:  Vancomycin 10/26 >>  Cefepime 10/26>>  Clindamycin x 1 on 10/26  Dose adjustments this admission:     10/29 Vanc trough = 10 on 1gm IV q12hrs> incr 1250 q12h     11/1 Vanc trough = 12 on 1250 mg IV q12h, no change  Microbiology results:  10/26 blood x 2 - negative  Thank you for allowing pharmacy to be a part of this patient's care.  Arty Baumgartner, [place] Pager: [phone] or phone: [phone] 08/01/2018 10:59 AM

## 2018-08-01 NOTE — Progress Notes (Signed)
[facility] HOSPITALIST PROGRESS NOTE  [name] Sr. [id number] DOB: April 03, 1951 DOA: 07/19/2018 PCP: Patient, No Pcp Per   Narrative: 67 year old male DM TY 2 Former smoker, daily drinker Prior history of accidental fall, seventh rib fracture 02/2008 Admitted initially [facility] 07/20/2018 3 months h/o first digit discoloration-possible PAD-apparently he dropped from his left midfoot and had some pain  Sent over to [facility] for evaluation by vascular surgery-placed on IV heparin-found to have critical limb ischemia left lower extremity with tissue loss monophasic dorsalis pedis-underwent arteriogram and left knee surgery consulted on him  A & Plan Severe PAD great toe gangrene-angiogram 07/25/2018 performed-left common iliac stent, left SFA popliteal angioplasty stent--was placed on antibiotics- transmetatarsal amputation secondary to gangrenous changes of amputation site on 11/5 Per Vasc surgery 1 day more on cefepime and Vanc then po bactrim x 1 wk cont tramadol 50 every 6 as needed, OxyIR 5-10 every 4 as needed moderate --no IV meds needed  Diabetes mellitus type 2-A1c 7.4 CBG ranging between 160-178-cont sensi SSI -started metformin 500 daily XL   Hypertension-no meds at home amlodipine 10 mg Coreg 6.25 twice daily and hydralazine 50 every 8-continue PRN labetalol every 8 as needed discontinuing PRN hydralazine  Leukocytosis-likely secondary to postop state-WBC still ^^ so watch x 1 more day  Hyperlipidemia continue the 20 daily  Chronic daily smoker patient has quit since the past 3 weeks  Chronic daily drinker stable at this time  Severe malnutrition BMI 21-outpatient management and monitoring  DVT prophylaxis: Lovenox code Status: Full family Communication: None disposition Plan: Inpatient   Staceyann Knouff, MD  [facility] Direct contact: [phone] --Via amion app OR  --[email or web address]; password [email or web address]  7PM-7AM contact night coverage as  above 08/01/2018, 8:26 AM  LOS: 11 days   Consultants:  Vascular  Procedures:  Now-multiple surgeries  Antimicrobials:  Vancomycin and ceftriaxone  Interval history/Subjective:  Pleasant awaeka lert  "I feel 80%" States pain is 9/10 currently in foot--but seated comfortably in the bed watching TV with no signs of pain or distress   Objective:  Vitals:  Vitals:   08/01/18 0133 08/01/18 0615  BP: 123/78 (!) 142/63  Pulse:  88  Resp:    Temp:    SpO2:  93%    Exam:  Pleasant older than stated age in no dsitress comfortable Throat clear ches tclear s 1s 2no m abd soft nt No LE edema Transtib amputation not examined today  I have personally reviewed the following:   Labs:  Sodium 132   white count is up from 8.6-13.1 and has stayed in 13 range  hemoglo down fropm 12.2--11  Imaging studies:  n  Medical tests:  n   Test discussed with performing physician:  n  Decision to obtain old records:  n  Review and summation of old records:  y  Scheduled Meds: . amLODipine  10 mg Oral Daily  . aspirin EC  81 mg Oral Daily  . atorvastatin  20 mg Oral q1800  . carvedilol  6.25 mg Oral BID WC  . clopidogrel  75 mg Oral Q breakfast  . enoxaparin (LOVENOX) injection  40 mg Subcutaneous Q24H  . hydrALAZINE  50 mg Oral Q8H  . insulin aspart  0-9 Units Subcutaneous TID WC  . metFORMIN  500 mg Oral BID WC  . nicotine  14 mg Transdermal Daily   Continuous Infusions: . sodium chloride 10 mL/hr at 07/26/18 0117  . ceFEPime (MAXIPIME) IV 2 g (08/01/18 0615)  .  lactated ringers 50 mL/hr at 07/30/18 1241  . vancomycin 1,250 mg (08/01/18 0015)    Principal Problem:   PAD (peripheral artery disease) (HCC) Active Problems:   Cellulitis in diabetic foot ([place])   Diabetes mellitus, type II ([place])   Cellulitis   Dry gangrene ([place])   LOS: 11 days

## 2018-08-01 NOTE — Progress Notes (Signed)
Patient suffers from left transmetatarsal amputation which impairs their ability to perform daily activities like mobilizing in the home.  A walker alone will not resolve the issues with performing activities of daily living. A wheelchair will allow patient to safely perform daily activities.  The patient can self propel in the home or has a caregiver who can provide assistance.     Ellamae Sia, PT, DPT Acute Rehabilitation Services Pager [phone] Office [phone]

## 2018-08-01 NOTE — Progress Notes (Signed)
Occupational Therapy Treatment Patient Details Name: John Paolucci Sr. MRN: [id number] DOB: [birth year] Today's Date: 08/01/2018    History of present illness 67 yo admitted with necrotic left toe s/p revascularization 10/30 and ray amputation 10/31. 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 PMhx: HTN, DM, arthritis   OT comments  Pt needing min guard assist for standing ADL and ambulation due to pain and inability to tolerate weight through L heel in Darco shoe. Performed squat pivot to BSC modified independently.   Follow Up Recommendations  Home health OT(for safety assessment)    Equipment Recommendations  3 in 1 bedside commode    Recommendations for Other Services      Precautions / Restrictions Precautions Precautions: Fall Precaution Comments: pt does not place weight through L foot due to pain Required Braces or Orthoses: Other Brace/Splint(Darco shoe) Restrictions Weight Bearing Restrictions: Yes LLE Weight Bearing: (WB through heel, Darco shoe) Other Position/Activity Restrictions: WBAT in Darco through heel       Mobility Bed Mobility Overal bed mobility: Independent             General bed mobility comments: HOB flat  Transfers Overall transfer level: Needs assistance Equipment used: Rolling walker (2 wheeled) Transfers: Sit to/from Stand Sit to Stand: Min guard         General transfer comment: min guard for safety, good hand placement, encouraged weight bearing through L heel in Darco shoe, but pt unable to tolerate    Balance     Sitting balance-Leahy Scale: Normal       Standing balance-Leahy Scale: Poor Standing balance comment: limited by pain and inability to tolerate weight on L foot                           ADL either performed or assessed with clinical judgement   ADL Overall ADL's : Needs assistance/impaired                     Lower Body Dressing: Min guard;Sit to/from stand Lower Body  Dressing Details (indicate cue type and reason): can don Darco shoe and manage pants after toileting with min guard assist for standing balance Toilet Transfer: Modified Independent;Squat-pivot;BSC   Toileting- Clothing Manipulation and Hygiene: Min guard;Sit to/from stand       Functional mobility during ADLs: Min guard;Rolling walker General ADL Comments: educated in technique for getting in tub without stepping over edge     Vision       Perception     Praxis      Cognition Arousal/Alertness: Awake/alert Behavior During Therapy: WFL for tasks assessed/performed Overall Cognitive Status: Within Functional Limits for tasks assessed                                          Exercises     Shoulder Instructions       General Comments      Pertinent Vitals/ Pain       Pain Assessment: Faces Faces Pain Scale: Hurts whole lot Pain Location: L foot Pain Descriptors / Indicators: Aching;Guarding Pain Intervention(s): Monitored during session;Premedicated before session;Repositioned  Home Living  Prior Functioning/Environment              Frequency  Min 2X/week        Progress Toward Goals  OT Goals(current goals can now be found in the care plan section)  Progress towards OT goals: Progressing toward goals  Acute Rehab OT Goals Patient Stated Goal: none stated OT Goal Formulation: With patient Time For Goal Achievement: 08/09/18 Potential to Achieve Goals: Good  Plan Discharge plan remains appropriate    Co-evaluation                 AM-PAC PT "6 Clicks" Daily Activity     Outcome Measure   Help from another person eating meals?: None Help from another person taking care of personal grooming?: A Little Help from another person toileting, which includes using toliet, bedpan, or urinal?: A Little Help from another person bathing (including washing, rinsing, drying)?: A  Little Help from another person to put on and taking off regular upper body clothing?: None Help from another person to put on and taking off regular lower body clothing?: A Little 6 Click Score: 20    End of Session Equipment Utilized During Treatment: Rolling walker;Gait belt  OT Visit Diagnosis: Unsteadiness on feet (R26.81);Pain;Other abnormalities of gait and mobility (R26.89) Pain - Right/Left: Left Pain - part of body: Ankle and joints of foot   Activity Tolerance Patient tolerated treatment well   Patient Left in chair;with call bell/phone within reach;with chair alarm set   Nurse Communication Mobility status        Time: 3976-7341 OT Time Calculation (min): 18 min  Charges: OT General Charges $OT Visit: 1 Visit OT Treatments $Self Care/Home Management : 8-22 mins  Nestor Lewandowsky, OTR/L Acute Rehabilitation Services Pager: [phone] Office: [phone]   [name] 08/01/2018, 11:11 AM

## 2018-08-01 NOTE — Progress Notes (Addendum)
[facility]  Subjective  - No new complaints, pain in the left foot.  He did ambulate some yesterday    Objective (!) 142/63 88 98.2 F (36.8 C) (Oral) 18 93%  Intake/Output Summary (Last 24 hours) at 08/01/2018 0732 Last data filed at 08/01/2018 0600 Gross per 24 hour  Intake 700 ml  Output 450 ml  Net 250 ml    Palpable AT left LE Stump appears viable with minimal dark skin edges medial incision, minimal edema and slight erythema.   Assessment/Planning: POD # 2 Left TMA  Advised to stay one more day for IV antibiotics and then would send him home on oral bactrim for 7 days. F/U with Dr. Carlis Abbott in one week for close surveillance of stump healing. PT for mobility.  I will order a rolling walker for home use.  Roxy Horseman 08/01/2018 7:32 AM --  Laboratory Lab Results: Recent Labs    07/31/18 0312 08/01/18 0318  WBC 13.1* 13.3*  HGB 12.2* 11.1*  HCT 36.9* 33.2*  PLT 301 296   BMET Recent Labs    07/31/18 0312 08/01/18 0318  NA 132* 132*  K 3.9 3.4*  CL 99 97*  CO2 25 27  GLUCOSE 220* 166*  BUN 8 6*  CREATININE 0.77 0.71  CALCIUM 8.4* 8.4*    COAG Lab Results  Component Value Date   INR 1.00 07/31/2018   INR 0.87 07/20/2018   No results found for: PTT  I have seen and evaluated the patient. I agree with the PA note as documented above. Feels like he needs one more day in hospital for working with PT.  Would continue IV antibiotics till tomorrow.  He knows this is high risk and failure would mean BKA.  One week PO antibotics at discharge.  Close interval follow-up.  Marty Heck, MD [facility] Office: [phone] Pager: [place]

## 2018-08-01 NOTE — Progress Notes (Addendum)
Physical Therapy Re Evaluation Patient Details Name: John Beaumier Sr. MRN: [id number] DOB: 28-Mar-1951 Today's Date: 08/01/2018    History of Present Illness 67 yo admitted with necrotic left toe s/p revascularization 10/30 and ray amputation 10/31. 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 PMhx: HTN, DM, arthritis    PT Comments    Patient re-evaluated s/p left foot transmetatarsal amputation. Requiring up to min assist for ambulating 10 feet with walker. Displaying moderate unsteadiness secondary to inability to tolerate weight through left heel in Darco shoe. Based on balance deficits, gait speed, and decreased safety awareness, presenting as a high fall risk. Recommending wheelchair for mobility at home. Also educated patient on desensitization techniques and provided resources for [facility] as patient has visual impairments and no insurance. Goals remain appropriate.   Follow Up Recommendations  Home health PT;Supervision for mobility/OOB     Equipment Recommendations  Rolling walker with 5" wheels;3in1 (PT);Wheelchair (measurements PT)    Recommendations for Other Services       Precautions / Restrictions Precautions Precautions: Fall Precaution Comments: impulsive Required Braces or Orthoses: Other Brace/Splint(Darco shoe) Restrictions Weight Bearing Restrictions: Yes LLE Weight Bearing: (WB through heel, Darco shoe) Other Position/Activity Restrictions: WB in Darco through heel    Mobility  Bed Mobility               General bed mobility comments: OOB in chair  Transfers Overall transfer level: Needs assistance Equipment used: Rolling walker (2 wheeled) Transfers: Sit to/from Stand Sit to Stand: Min guard         General transfer comment: min guard for safety  Ambulation/Gait Ambulation/Gait assistance: Min assist Gait Distance (Feet): 10 Feet Assistive device: Rolling walker (2 wheeled) Gait Pattern/deviations: Step-to  pattern;Decreased stance time - left;Decreased weight shift to left;Trunk flexed     General Gait Details: Patient very impulsive and attempting to move chair out of his way while holding onto walker, thus having a lateral LOB requiring minA by PT to correct. Cues provided for walker proximity. Tending to treat LLE as essentially NWB   Stairs             Wheelchair Mobility    Modified Rankin (Stroke Patients Only)       Balance     Sitting balance-Leahy Scale: Normal       Standing balance-Leahy Scale: Poor Standing balance comment: limited by pain and inability to tolerate weight on L foot                            Cognition Arousal/Alertness: Awake/alert Behavior During Therapy: WFL for tasks assessed/performed Overall Cognitive Status: Within Functional Limits for tasks assessed                                        Exercises      General Comments        Pertinent Vitals/Pain Pain Assessment: Faces Faces Pain Scale: Hurts little more Pain Location: L foot Pain Descriptors / Indicators: Aching;Guarding Pain Intervention(s): Monitored during session    Home Living                      Prior Function            PT Goals (current goals can now be found in the care plan section) Acute Rehab PT  Goals Patient Stated Goal: none stated Potential to Achieve Goals: Good    Frequency    Min 3X/week      PT Plan Equipment recommendations need to be updated    Co-evaluation              AM-PAC PT "6 Clicks" Daily Activity  Outcome Measure  Difficulty turning over in bed (including adjusting bedclothes, sheets and blankets)?: None Difficulty moving from lying on back to sitting on the side of the bed? : None Difficulty sitting down on and standing up from a chair with arms (e.g., wheelchair, bedside commode, etc,.)?: A Little Help needed moving to and from a bed to chair (including a wheelchair)?: A  Little Help needed walking in hospital room?: A Little Help needed climbing 3-5 steps with a railing? : A Lot 6 Click Score: 19    End of Session Equipment Utilized During Treatment: Gait belt;Other (comment)(Darco shoe) Activity Tolerance: Patient tolerated treatment well Patient left: in chair;with call bell/phone within reach Nurse Communication: Mobility status PT Visit Diagnosis: Unsteadiness on feet (R26.81)     Time: 4801-6553 PT Time Calculation (min) (ACUTE ONLY): 14 min  Charges:                        Ellamae Sia, PT, DPT Acute Rehabilitation Services Pager [phone] Office [phone]    Willy Eddy 08/01/2018, 5:04 PM

## 2018-08-02 ENCOUNTER — Inpatient Hospital Stay (HOSPITAL_COMMUNITY): Payer: Medicare Other

## 2018-08-02 ENCOUNTER — Inpatient Hospital Stay (HOSPITAL_COMMUNITY): Payer: Medicare Other | Admitting: Anesthesiology

## 2018-08-02 ENCOUNTER — Encounter (HOSPITAL_COMMUNITY): Payer: Self-pay | Admitting: Certified Registered Nurse Anesthetist

## 2018-08-02 ENCOUNTER — Encounter (HOSPITAL_COMMUNITY)
Admission: EM | Disposition: A | Discharge status: Rehabilitation Facility | Payer: Self-pay | Source: Home / Self Care | Attending: Internal Medicine

## 2018-08-02 DIAGNOSIS — T8781 Dehiscence of amputation stump: Secondary | ICD-10-CM

## 2018-08-02 HISTORY — PX: AMPUTATION: SHX166

## 2018-08-02 LAB — CBC WITH DIFFERENTIAL/PLATELET
Basophils Absolute: 0.1 10*3/uL (ref 0.0–0.1)
Basophils Relative: 1 %
Eosinophils Absolute: 0.1 10*3/uL (ref 0.0–0.5)
Eosinophils Relative: 1 %
HCT: 32 % — ABNORMAL LOW (ref 39.0–52.0)
HEMOGLOBIN: 10.8 g/dL — AB (ref 13.0–17.0)
LYMPHS PCT: 7 %
Lymphs Abs: 0.8 10*3/uL (ref 0.7–4.0)
MCH: 29.8 pg (ref 26.0–34.0)
MCHC: 33.8 g/dL (ref 30.0–36.0)
MCV: 88.2 fL (ref 80.0–100.0)
MONO ABS: 1.1 10*3/uL — AB (ref 0.1–1.0)
MONOS PCT: 9 %
NEUTROS ABS: 9.6 10*3/uL — AB (ref 1.7–7.7)
Neutrophils Relative %: 82 %
Platelets: 299 10*3/uL (ref 150–400)
RBC: 3.63 MIL/uL — AB (ref 4.22–5.81)
RDW: 12.7 % (ref 11.5–15.5)
WBC: 11.7 10*3/uL — ABNORMAL HIGH (ref 4.0–10.5)
nRBC: 0 % (ref 0.0–0.2)
nRBC: 0 /100 WBC

## 2018-08-02 LAB — BLOOD GAS, ARTERIAL
Acid-Base Excess: 3 mmol/L — ABNORMAL HIGH (ref 0.0–2.0)
BICARBONATE: 26.9 mmol/L (ref 20.0–28.0)
Drawn by: 51191
O2 Saturation: 99.4 %
PATIENT TEMPERATURE: 101.4
PO2 ART: 212 mmHg — AB (ref 83.0–108.0)
pCO2 arterial: 43 mmHg (ref 32.0–48.0)
pH, Arterial: 7.421 (ref 7.350–7.450)

## 2018-08-02 LAB — RENAL FUNCTION PANEL
ALBUMIN: 2.5 g/dL — AB (ref 3.5–5.0)
ANION GAP: 9 (ref 5–15)
BUN: 6 mg/dL — ABNORMAL LOW (ref 8–23)
CALCIUM: 8.2 mg/dL — AB (ref 8.9–10.3)
CO2: 25 mmol/L (ref 22–32)
CREATININE: 0.72 mg/dL (ref 0.61–1.24)
Chloride: 95 mmol/L — ABNORMAL LOW (ref 98–111)
Glucose, Bld: 172 mg/dL — ABNORMAL HIGH (ref 70–99)
PHOSPHORUS: 3.3 mg/dL (ref 2.5–4.6)
Potassium: 3.2 mmol/L — ABNORMAL LOW (ref 3.5–5.1)
SODIUM: 129 mmol/L — AB (ref 135–145)

## 2018-08-02 LAB — GLUCOSE, CAPILLARY
GLUCOSE-CAPILLARY: 128 mg/dL — AB (ref 70–99)
GLUCOSE-CAPILLARY: 246 mg/dL — AB (ref 70–99)
Glucose-Capillary: 153 mg/dL — ABNORMAL HIGH (ref 70–99)
Glucose-Capillary: 157 mg/dL — ABNORMAL HIGH (ref 70–99)
Glucose-Capillary: 128 mg/dL — ABNORMAL HIGH (ref 70–99)

## 2018-08-02 LAB — LACTIC ACID, PLASMA
LACTIC ACID, VENOUS: 0.9 mmol/L (ref 0.5–1.9)
Lactic Acid, Venous: 0.9 mmol/L (ref 0.5–1.9)

## 2018-08-02 LAB — D-DIMER, QUANTITATIVE: D-Dimer, Quant: 1.46 ug/mL-FEU — ABNORMAL HIGH (ref 0.00–0.50)

## 2018-08-02 LAB — BRAIN NATRIURETIC PEPTIDE: B Natriuretic Peptide: 311.7 pg/mL — ABNORMAL HIGH (ref 0.0–100.0)

## 2018-08-02 LAB — TROPONIN I

## 2018-08-02 SURGERY — AMPUTATION BELOW KNEE
Anesthesia: Regional | Site: Leg Lower | Laterality: Left

## 2018-08-02 MED ORDER — DEXAMETHASONE SODIUM PHOSPHATE 10 MG/ML IJ SOLN
INTRAMUSCULAR | Status: DC | PRN
  Administered 2018-08-02: 5 mg via INTRAVENOUS

## 2018-08-02 MED ORDER — FENTANYL CITRATE (PF) 100 MCG/2ML IJ SOLN
100.0000 ug | Freq: Once | INTRAMUSCULAR | Status: AC
  Administered 2018-08-02: 100 ug via INTRAVENOUS

## 2018-08-02 MED ORDER — MIDAZOLAM HCL 2 MG/2ML IJ SOLN
2.0000 mg | Freq: Once | INTRAMUSCULAR | Status: AC
  Administered 2018-08-02: 2 mg via INTRAVENOUS

## 2018-08-02 MED ORDER — LIDOCAINE 2% (20 MG/ML) 5 ML SYRINGE
INTRAMUSCULAR | Status: DC | PRN
  Administered 2018-08-02: 80 mg via INTRAVENOUS

## 2018-08-02 MED ORDER — ONDANSETRON HCL 4 MG/2ML IJ SOLN: 4.0000 mg | Freq: Once | INTRAMUSCULAR | Status: DC | PRN

## 2018-08-02 MED ORDER — MIDAZOLAM HCL 2 MG/2ML IJ SOLN
INTRAMUSCULAR | Status: AC
  Filled 2018-08-02: qty 2

## 2018-08-02 MED ORDER — DEXAMETHASONE SODIUM PHOSPHATE 10 MG/ML IJ SOLN
INTRAMUSCULAR | Status: AC
  Filled 2018-08-02: qty 1

## 2018-08-02 MED ORDER — FENTANYL CITRATE (PF) 250 MCG/5ML IJ SOLN
INTRAMUSCULAR | Status: AC
  Filled 2018-08-02: qty 5

## 2018-08-02 MED ORDER — PROPOFOL 10 MG/ML IV BOLUS
INTRAVENOUS | Status: DC | PRN
  Administered 2018-08-02: 100 mg via INTRAVENOUS

## 2018-08-02 MED ORDER — LACTATED RINGERS IV BOLUS: 500.0000 mL | Freq: Once | INTRAVENOUS | Status: DC

## 2018-08-02 MED ORDER — POTASSIUM CHLORIDE CRYS ER 20 MEQ PO TBCR
40.0000 meq | EXTENDED_RELEASE_TABLET | Freq: Every day | ORAL | Status: DC
  Administered 2018-08-02 – 2018-08-05 (×4): 40 meq via ORAL
  Filled 2018-08-02 (×4): qty 2

## 2018-08-02 MED ORDER — OXYCODONE HCL 5 MG PO TABS: 5.0000 mg | ORAL_TABLET | Freq: Once | ORAL | Status: DC | PRN

## 2018-08-02 MED ORDER — 0.9 % SODIUM CHLORIDE (POUR BTL) OPTIME
TOPICAL | Status: DC | PRN
  Administered 2018-08-02 (×2): 1000 mL

## 2018-08-02 MED ORDER — FENTANYL CITRATE (PF) 100 MCG/2ML IJ SOLN: 25.0000 ug | INTRAMUSCULAR | Status: DC | PRN

## 2018-08-02 MED ORDER — ROPIVACAINE HCL 5 MG/ML IJ SOLN
INTRAMUSCULAR | Status: DC | PRN
  Administered 2018-08-02 (×2): 15 mL via PERINEURAL

## 2018-08-02 MED ORDER — LACTATED RINGERS IV SOLN: INTRAVENOUS | Status: DC

## 2018-08-02 MED ORDER — SODIUM CHLORIDE 0.9% FLUSH: 9.0000 mL | INTRAVENOUS | Status: DC | PRN

## 2018-08-02 MED ORDER — ONDANSETRON HCL 4 MG/2ML IJ SOLN
INTRAMUSCULAR | Status: AC
  Filled 2018-08-02: qty 2

## 2018-08-02 MED ORDER — VANCOMYCIN HCL 10 G IV SOLR
1250.0000 mg | Freq: Two times a day (BID) | INTRAVENOUS | Status: AC
  Administered 2018-08-02 – 2018-08-04 (×5): 1250 mg via INTRAVENOUS
  Filled 2018-08-02 (×5): qty 1250

## 2018-08-02 MED ORDER — DIPHENHYDRAMINE HCL 50 MG/ML IJ SOLN
12.5000 mg | Freq: Four times a day (QID) | INTRAMUSCULAR | Status: DC | PRN
  Filled 2018-08-02: qty 0.25

## 2018-08-02 MED ORDER — FENTANYL CITRATE (PF) 100 MCG/2ML IJ SOLN
INTRAMUSCULAR | Status: DC | PRN
  Administered 2018-08-02 (×6): 25 ug via INTRAVENOUS
  Administered 2018-08-02 (×2): 50 ug via INTRAVENOUS

## 2018-08-02 MED ORDER — OXYCODONE HCL 5 MG/5ML PO SOLN: 5.0000 mg | Freq: Once | ORAL | Status: DC | PRN

## 2018-08-02 MED ORDER — NALOXONE HCL 0.4 MG/ML IJ SOLN
0.4000 mg | INTRAMUSCULAR | Status: DC | PRN
  Filled 2018-08-02: qty 1

## 2018-08-02 MED ORDER — SODIUM CHLORIDE 0.9 % IV SOLN
INTRAVENOUS | Status: DC | PRN
  Administered 2018-08-02: 25 ug/min via INTRAVENOUS

## 2018-08-02 MED ORDER — ONDANSETRON HCL 4 MG/2ML IJ SOLN
4.0000 mg | Freq: Four times a day (QID) | INTRAMUSCULAR | Status: DC | PRN
  Filled 2018-08-02: qty 2

## 2018-08-02 MED ORDER — DIPHENHYDRAMINE HCL 12.5 MG/5ML PO ELIX
12.5000 mg | ORAL_SOLUTION | Freq: Four times a day (QID) | ORAL | Status: DC | PRN
  Filled 2018-08-02: qty 5

## 2018-08-02 MED ORDER — ONDANSETRON HCL 4 MG/2ML IJ SOLN
INTRAMUSCULAR | Status: DC | PRN
  Administered 2018-08-02: 4 mg via INTRAVENOUS

## 2018-08-02 MED ORDER — SODIUM CHLORIDE 0.9 % IV SOLN
2.0000 g | Freq: Two times a day (BID) | INTRAVENOUS | Status: AC
  Administered 2018-08-03 – 2018-08-04 (×4): 2 g via INTRAVENOUS
  Filled 2018-08-02 (×5): qty 2

## 2018-08-02 MED ORDER — FENTANYL CITRATE (PF) 100 MCG/2ML IJ SOLN
INTRAMUSCULAR | Status: AC
  Administered 2018-08-02: 100 ug via INTRAVENOUS
  Filled 2018-08-02: qty 2

## 2018-08-02 MED ORDER — HYDROMORPHONE 1 MG/ML IV SOLN
INTRAVENOUS | Status: DC
  Administered 2018-08-02: 0.2 mg via INTRAVENOUS
  Administered 2018-08-02: 25 mg via INTRAVENOUS
  Administered 2018-08-03: 1.2 mg via INTRAVENOUS
  Administered 2018-08-04 (×2): 1 mg via INTRAVENOUS
  Administered 2018-08-04: 0.2 mg via INTRAVENOUS
  Filled 2018-08-02: qty 25

## 2018-08-02 MED ORDER — MIDAZOLAM HCL 2 MG/2ML IJ SOLN
INTRAMUSCULAR | Status: AC
  Administered 2018-08-02: 2 mg via INTRAVENOUS
  Filled 2018-08-02: qty 2

## 2018-08-02 SURGICAL SUPPLY — 56 items
BANDAGE ACE 4X5 VEL STRL LF (GAUZE/BANDAGES/DRESSINGS) ×3 IMPLANT
BANDAGE ACE 6X5 VEL STRL LF (GAUZE/BANDAGES/DRESSINGS) ×3 IMPLANT
BANDAGE ELASTIC 4 VELCRO ST LF (GAUZE/BANDAGES/DRESSINGS) ×3 IMPLANT
BANDAGE ELASTIC 6 VELCRO ST LF (GAUZE/BANDAGES/DRESSINGS) ×3 IMPLANT
BANDAGE ESMARK 6X9 LF (GAUZE/BANDAGES/DRESSINGS) IMPLANT
BLADE SAW SAG 73X25 THK (BLADE) ×1
BLADE SAW SGTL 73X25 THK (BLADE) ×2 IMPLANT
BNDG COHESIVE 6X5 TAN STRL LF (GAUZE/BANDAGES/DRESSINGS) ×3 IMPLANT
BNDG ESMARK 6X9 LF (GAUZE/BANDAGES/DRESSINGS)
BNDG GAUZE ELAST 4 BULKY (GAUZE/BANDAGES/DRESSINGS) ×3 IMPLANT
CANISTER SUCT 3000ML PPV (MISCELLANEOUS) ×3 IMPLANT
CLIP VESOCCLUDE MED 6/CT (CLIP) IMPLANT
COVER BACK TABLE 60X90IN (DRAPES) IMPLANT
COVER SURGICAL LIGHT HANDLE (MISCELLANEOUS) ×3 IMPLANT
COVER WAND RF STERILE (DRAPES) ×3 IMPLANT
CUFF TOURNIQUET SINGLE 24IN (TOURNIQUET CUFF) IMPLANT
CUFF TOURNIQUET SINGLE 34IN LL (TOURNIQUET CUFF) IMPLANT
CUFF TOURNIQUET SINGLE 44IN (TOURNIQUET CUFF) IMPLANT
DRAIN CHANNEL 19F RND (DRAIN) IMPLANT
DRAPE HALF SHEET 40X57 (DRAPES) ×3 IMPLANT
DRAPE ORTHO SPLIT 77X108 STRL (DRAPES) ×4
DRAPE SURG ORHT 6 SPLT 77X108 (DRAPES) ×2 IMPLANT
DRSG ADAPTIC 3X8 NADH LF (GAUZE/BANDAGES/DRESSINGS) ×3 IMPLANT
ELECT REM PT RETURN 9FT ADLT (ELECTROSURGICAL) ×3
ELECTRODE REM PT RTRN 9FT ADLT (ELECTROSURGICAL) ×1 IMPLANT
EVACUATOR SILICONE 100CC (DRAIN) IMPLANT
GAUZE SPONGE 4X4 12PLY STRL (GAUZE/BANDAGES/DRESSINGS) ×3 IMPLANT
GLOVE BIO SURGEON STRL SZ7.5 (GLOVE) ×3 IMPLANT
GLOVE BIOGEL PI IND STRL 8 (GLOVE) ×1 IMPLANT
GLOVE BIOGEL PI INDICATOR 8 (GLOVE) ×2
GOWN STRL REUS W/ TWL LRG LVL3 (GOWN DISPOSABLE) ×2 IMPLANT
GOWN STRL REUS W/ TWL XL LVL3 (GOWN DISPOSABLE) ×2 IMPLANT
GOWN STRL REUS W/TWL LRG LVL3 (GOWN DISPOSABLE) ×4
GOWN STRL REUS W/TWL XL LVL3 (GOWN DISPOSABLE) ×4
KIT BASIN OR (CUSTOM PROCEDURE TRAY) ×3 IMPLANT
KIT TURNOVER KIT B (KITS) ×3 IMPLANT
NS IRRIG 1000ML POUR BTL (IV SOLUTION) ×3 IMPLANT
PACK GENERAL/GYN (CUSTOM PROCEDURE TRAY) ×3 IMPLANT
PAD ARMBOARD 7.5X6 YLW CONV (MISCELLANEOUS) ×6 IMPLANT
SLEEVE SURGEON STRL (DRAPES) ×3 IMPLANT
SPONGE LAP 18X18 RF (DISPOSABLE) ×3 IMPLANT
STAPLER VISISTAT 35W (STAPLE) ×3 IMPLANT
STOCKINETTE IMPERVIOUS LG (DRAPES) ×3 IMPLANT
SUT ETHILON 3 0 PS 1 (SUTURE) IMPLANT
SUT SILK 0 TIES 10X30 (SUTURE) IMPLANT
SUT SILK 2 0 (SUTURE) ×4
SUT SILK 2-0 18XBRD TIE 12 (SUTURE) ×2 IMPLANT
SUT SILK 3 0 (SUTURE) ×2
SUT SILK 3 0 SH CR/8 (SUTURE) ×3 IMPLANT
SUT SILK 3-0 18XBRD TIE 12 (SUTURE) ×1 IMPLANT
SUT VIC AB 2-0 CT1 18 (SUTURE) ×6 IMPLANT
SUT VIC AB 3-0 SH 18 (SUTURE) IMPLANT
TAPE UMBILICAL COTTON 1/8X30 (MISCELLANEOUS) ×3 IMPLANT
TOWEL GREEN STERILE (TOWEL DISPOSABLE) ×6 IMPLANT
UNDERPAD 30X30 (UNDERPADS AND DIAPERS) ×3 IMPLANT
WATER STERILE IRR 1000ML POUR (IV SOLUTION) ×3 IMPLANT

## 2018-08-02 NOTE — Anesthesia Procedure Notes (Signed)
Anesthesia Regional Block: Popliteal block   Pre-Anesthetic Checklist: ,, timeout performed, Correct Patient, Correct Site, Correct Laterality, Correct Procedure, Correct Position, site marked, Risks and benefits discussed,  Surgical consent,  Pre-op evaluation,  At surgeon's request and post-op pain management  Laterality: Left  Prep: chloraprep       Needles:  Injection technique: Single-shot  Needle Type: Echogenic Stimulator Needle     Needle Length: 5cm  Needle Gauge: 22     Additional Needles:   Procedures:, nerve stimulator,,, ultrasound used (permanent image in chart),,,,  Narrative:  Start time: 08/02/2018 2:30 PM End time: 08/02/2018 2:53 PM Injection made incrementally with aspirations every 5 mL.  Performed by: Personally  Anesthesiologist: Janeece Riggers, MD  Additional Notes: Functioning IV was confirmed and monitors were applied.  A 30mm 22ga Arrow echogenic stimulator needle was used. Sterile prep and drape,hand hygiene and sterile gloves were used. Ultrasound guidance: relevant anatomy identified, needle position confirmed, local anesthetic spread visualized around nerve(s)., vascular puncture avoided.  Image printed for medical record. Negative aspiration and negative test dose prior to incremental administration of local anesthetic. The patient tolerated the procedure well.

## 2018-08-02 NOTE — Progress Notes (Signed)
PT Cancellation Note  Patient Details Name: John Delfavero Sr. MRN: [id number] DOB: 1951-01-10   Cancelled Treatment:    Reason Eval/Treat Not Completed: Patient at procedure or test/unavailable Pt currently out of room for procedure. Will follow up as schedule allows.   Leighton Ruff, PT, DPT  Acute Rehabilitation Services  Pager: [phone] Office: [phone]  Rudean Hitt 08/02/2018, 2:27 PM

## 2018-08-02 NOTE — Anesthesia Postprocedure Evaluation (Signed)
Anesthesia Post Note  Patient: John Laurence Sr.  Procedure(s) Performed: LEFT AMPUTATION BELOW KNEE (Left Leg Lower)     Patient location during evaluation: PACU Anesthesia Type: Regional and General Level of consciousness: awake and alert, oriented and patient cooperative Pain management: pain level controlled Vital Signs Assessment: post-procedure vital signs reviewed and stable Respiratory status: spontaneous breathing, nonlabored ventilation and respiratory function stable Cardiovascular status: blood pressure returned to baseline and stable Postop Assessment: no apparent nausea or vomiting Anesthetic complications: no    Last Vitals:  Vitals:   08/02/18 1620 08/02/18 1625  BP: (!) 97/55   Pulse: 68 68  Resp: 15 16  Temp: 36.6 C   SpO2: 94% 96%    Last Pain:  Vitals:   08/02/18 1620  TempSrc:   PainSc: Asleep                 Corliss Lamartina,E. Milly Goggins

## 2018-08-02 NOTE — Significant Event (Signed)
Rapid Response Event Note  Overview: Respiratory   Initial Focused Assessment: Called by RN about patient having an acute onset of respiratory distress. RN had already paged [facility] NP and received orders for STAT CXR, STAT ABG, and STAT labs. Upon arrival, patient was diaphoretic, + SOB, increased WOB, lung sounds very diminished, minimally air movement on the right, skin cool to touch, HR in the 100-110s, hypertensive SBP 160-180s. Patient was placed on PNRB and saturations were 100% when I arrived but per RN, initially saturations were in the low 70s. + Temp 101.4 (O), RR upper 30s - low 40s, + use of accessory muscles.  Interventions: -- STAT CXR - Multifocal opacities within the right lung,may indicate multifocal pneumonia. -- STAT ABG  - normal -- STAT Labs - LA 0.9, D-Dimer 1.46,  -- APAP x 1 for fever -- BIPAP - after BIPAP was initiated, patient WOB and effort improved greatly, RR now in the low 20s, patient stated he felt better.   Plan of Care: -- BIPAP to remain on for 3-4 hours for now. RN and RT to reassess need for BIPAP and wean as patient tolerates. -- Call RRT if needed.  Event Summary:    at    Call Time 0358 Arrival Time 0400 End Time 0435  Irania Durell R

## 2018-08-02 NOTE — Anesthesia Procedure Notes (Signed)
Procedure Name: LMA Insertion Date/Time: 08/02/2018 3:02 PM Performed by: Candis Shine, CRNA Pre-anesthesia Checklist: Patient identified, Emergency Drugs available, Suction available and Patient being monitored Patient Re-evaluated:Patient Re-evaluated prior to induction Oxygen Delivery Method: Circle System Utilized Preoxygenation: Pre-oxygenation with 100% oxygen Induction Type: IV induction Ventilation: Mask ventilation without difficulty LMA: LMA inserted LMA Size: 5.0 Number of attempts: 1 Placement Confirmation: positive ETCO2 Tube secured with: Tape Dental Injury: Teeth and Oropharynx as per pre-operative assessment

## 2018-08-02 NOTE — Progress Notes (Signed)
@  approx. 0335 pt called out endorsing shortness of breath. Pt assessed and placed on monitor. SpO2 73% on RA, pt diaphoretic, tachypnic, and hypertensive (see flowsheet for complete vital signs). Pt placed on 6L [place] with SpO2 improving to 91%. Placement of NRB increased SpO2 to 99-100% but pt work of breathing still vastly increased from previous assessment. Lung fields assessed and diminished throughout. [facility] paged regarding above @0351 . Orders received.   @0358  RR RN paged to help assess pt. Additional orders received.  Xray,ABG, and additional labs collected. Pt placed on Bi-pap to reduce work of breathing. Pt presently resting comfortably. Will continue to monitor and assess.

## 2018-08-02 NOTE — Clinical Social Work Note (Addendum)
Clinical Social Work Assessment  Patient Details  Name: John Danese Sr. MRN: [id number] Date of Birth: 28-Apr-1951  Date of referral:  08/02/18               Reason for consult:  Facility Placement                Permission sought to share information with:  Family Supports Permission granted to share information::  Yes, Verbal Permission Granted  Name::     [name] and Lakeline::     Relationship::  wife and daughter   Contact Information:  [phone] and [phone]  Housing/Transportation Living arrangements for the past 2 months:  Single Family Home Source of Information:  Patient Patient Interpreter Needed:  None Criminal Activity/Legal Involvement Pertinent to Current Situation/Hospitalization:  No - Comment as needed Significant Relationships:  Adult Children, Spouse Lives with:  Spouse Do you feel safe going back to the place where you live?  Yes Need for family participation in patient care:  Yes (Comment)  Care giving concerns:  CSW received consult for discharge needs. CSW spoke with patient regarding possible PT recommendation of SNF placement at time of discharge. Patient states he lives in the home with his wife. He has steps on the front porch only. Patient anticipates going home once discharged from the hospital.    Social Worker assessment / plan: CSW spoke with patient concerning possibility of rehab at [facility] before returning home. Patient is currently refusing SNF placement. Patient states his wife work works during the day but his daughter can assist him at home during the day. Patient gave the CSW permission to contact his wife @ [phone]. Patient's wife is in agreement with the patient being discharged home. RNCM informed of patient's decision.   Employment status:  Unemployed Forensic scientist:  Self Pay (Medicaid Pending) PT Recommendations:  Not assessed at this time Information / Referral to community resources:     Patient/Family's Response  to care:  Patient recognizes need for possible rehab before returning home but refusing SNF at this time.    Patient/Family's Understanding of and Emotional Response to Diagnosis, Current Treatment, and Prognosis:  Patient and his family  is realistic regarding therapy needs and expressed being hopeful for patient to go home with home health. Patient was pleasant and answered questions appropriately. Patient expressed understanding of CSW role and discharge process as well as medical condition. No questions/concerns about plan or treatment.    Emotional Assessment Appearance:  Appears stated age Attitude/Demeanor/Rapport:  (pleasant) Affect (typically observed):  Accepting, Appropriate, Calm Orientation:  Oriented to Self, Oriented to Place, Oriented to  Time, Oriented to Situation Alcohol / Substance use:  Not Applicable Psych involvement (Current and /or in the community):  No (Comment)  Discharge Needs  Concerns to be addressed:  Care Coordination Readmission within the last 30 days:  No Current discharge risk:  Dependent with Mobility Barriers to Discharge:  Continued Medical Work up   [employer], [place] 08/02/2018, 1:50 PM

## 2018-08-02 NOTE — Op Note (Signed)
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 Marty Heck, MD  Assistant: Roxy Horseman, PA  Indications: Patient is a 67 year old male who was recently transferred here for critical limb ischemia of his left lower extremity with severe tissue loss.  At the time he had a monophasic signal in his left foot with a necrotic left great toe and necrotic tissue on the dorsum of his foot.  He was ultimately taken to the [facility] lab for peripheral revascularization and had a palpable pulse at completion.  We initially tried doing only a toe amputuation but that failed to heal and then converted to a TMA earlier this week.  Unfortunately the TMA was high risk given that he had a significant amount of necrotic tissue in his foot and now his TMA has become cellulitic nonhealing and he has had continued fevers.  He presents today for planned left below-knee amputation after risks and benefits were discussed.  Findings: Left below-knee amputation with healthy and viable tissue margins.  Anesthesia: LMA  Details: The patient was taken to the operating room after informed consent was obtained.  He was placed on operating table in the supine position and LMA was induced.  His left leg was prepped and draped in standard sterile fashion and we put a sterile sock on his left TMA.  A preop timeout was performed to identify patient, procedure, and site.  We initially marked out his below-knee amputation margin approximately one handbreadth below the tibial tuberosity.  Here we used the two thirds one third rule and marked out the circumference of the calf with a suture and then this was transected into one third and two thirds distances.  The two thirds distance was used to mark the anterior posterior amputation margin and the one third distance was to  mark the length of the flap.  We then used a Bovie cautery and transected all the subcutaneous tissue and fascia along the marked lines.  All muscle was transected with Bovie cautery.  Ultimately the anterior tibial artery was identified and ligated with 3-0 silk suture.  The peroneal and posterior tibial arteries were also identified dissected and ligated with 3-0 silk sutures.  The fibula was transected with a bone cutter.  The tibia was transected just below the fibula with a oscillating saw.  Ultimately the posterior flap was made using gastrocnemius and soleus muscle.  The wound was then copiously irrigated with saline until effluent was clear.  We got hemostasis with all bleeders using 3-0 silk pops and Bovie cautery.  Ultimately the posterior flap was then folded up and using 2-0 Vicryl in interrupted fashion we closed the fascia.  The skin was then secured with staples.  The flap overall looked well at the completion of the case.  Taken to PACU in stable condition.  Complications: None  Condition: Stable  Marty Heck, MD [facility] [place] Office: [phone] Pager: [place]

## 2018-08-02 NOTE — Anesthesia Preprocedure Evaluation (Addendum)
Anesthesia Evaluation  Patient identified by MRN, date of birth, ID band Patient awake    Reviewed: Allergy & Precautions, H&P , NPO status , Patient's Chart, lab work & pertinent test results, reviewed documented beta blocker date and time   History of Anesthesia Complications Negative for: history of anesthetic complications  Airway Mallampati: I  TM Distance: >3 FB Neck ROM: full    Dental no notable dental hx. (+) Poor Dentition, Chipped, Missing, Loose   Pulmonary neg pulmonary ROS, Current Smoker,    Pulmonary exam normal breath sounds clear to auscultation       Cardiovascular Exercise Tolerance: Good hypertension, Pt. on medications + Peripheral Vascular Disease  negative cardio ROS  + Valvular Problems/Murmurs AS  Rhythm:regular Rate:Normal   '19 TTE - Mild LVH. EF 60% to 65%. Grade 1 diastolic dysfunction. Mod-Severe AS. Valve area (VTI): 1.06 cm^2. Valve area (Vmean): 1.02 cm^2.    Neuro/Psych negative neurological ROS  negative psych ROS   GI/Hepatic negative GI ROS, Neg liver ROS, (+)     substance abuse  alcohol use,   Endo/Other  negative endocrine ROSdiabetes, Type 2, Oral Hypoglycemic Agents Hyponatremia Hypocalcemia Hypokalemia   Renal/GU negative Renal ROS  negative genitourinary   Musculoskeletal  (+) Arthritis ,   Abdominal   Peds  Hematology negative hematology ROS (+) anemia ,   Anesthesia Other Findings   Reproductive/Obstetrics negative OB ROS                                                             Anesthesia Evaluation  Patient identified by MRN, date of birth, ID band Patient awake    Reviewed: Allergy & Precautions, NPO status , Patient's Chart, lab work & pertinent test results  History of Anesthesia Complications Negative for: history of anesthetic complications  Airway Mallampati: I  TM Distance: >3 FB Neck ROM: Full     Dental  (+) Poor Dentition, Missing, Loose   Pulmonary neg pulmonary ROS, Current Smoker,    breath sounds clear to auscultation       Cardiovascular hypertension, Pt. on medications + Peripheral Vascular Disease  Normal cardiovascular exam+ Valvular Problems/Murmurs   Echo 07/24/2018 - Left ventricle: The cavity size was normal. Wall thickness was increased in a pattern of mild LVH. Systolic function was normal. The estimated ejection fraction was in the range of 60% to 65%. Wall motion was normal; there were no regional wall motion abnormalities. Doppler parameters are consistent with abnormal left ventricular relaxation (grade 1 diastolic dysfunction). - Aortic valve: Moderately to severely calcified annulus.   Moderately thickened, moderately calcified leaflets. There was moderate to severe stenosis. Valve area (VTI): 1.06 cm^2. Valve area (Vmean): 1.02 cm^2. - Mitral valve: Mildly calcified annulus.    Neuro/Psych negative neurological ROS  negative psych ROS   GI/Hepatic negative GI ROS, Neg liver ROS,   Endo/Other  diabetes, Poorly Controlled, Type 2  Renal/GU negative Renal ROS     Musculoskeletal  (+) Arthritis ,   Abdominal   Peds  Hematology negative hematology ROS (+)   Anesthesia Other Findings   Reproductive/Obstetrics negative OB ROS                            Anesthesia Physical  Anesthesia  Plan  ASA: III  Anesthesia Plan: MAC   Post-op Pain Management:    Induction: Intravenous  PONV Risk Score and Plan: 2 and Ondansetron and Dexamethasone  Airway Management Planned: Natural Airway and Simple Face Mask  Additional Equipment:   Intra-op Plan:   Post-operative Plan:   Informed Consent: I have reviewed the patients History and Physical, chart, labs and discussed the procedure including the risks, benefits and alternatives for the proposed anesthesia with the patient or authorized representative who has  indicated his/her understanding and acceptance.   Dental advisory given  Plan Discussed with: CRNA, Anesthesiologist and Surgeon  Anesthesia Plan Comments:        Anesthesia Quick Evaluation  Anesthesia Physical Anesthesia Plan  ASA: IV  Anesthesia Plan: Regional   Post-op Pain Management: GA combined w/ Regional for post-op pain   Induction: Intravenous  PONV Risk Score and Plan: 1 and Propofol infusion, Treatment may vary due to age or medical condition, Ondansetron and Midazolam  Airway Management Planned: LMA  Additional Equipment: None  Intra-op Plan:   Post-operative Plan: Extubation in OR  Informed Consent: I have reviewed the patients History and Physical, chart, labs and discussed the procedure including the risks, benefits and alternatives for the proposed anesthesia with the patient or authorized representative who has indicated his/her understanding and acceptance.   Dental advisory given  Plan Discussed with: CRNA, Anesthesiologist and Surgeon  Anesthesia Plan Comments: (Discussed both nerve block for pain relief post-op and GA; including NV, sore throat, dental injury, and pulmonary complications)       Anesthesia Quick Evaluation

## 2018-08-02 NOTE — Progress Notes (Signed)
[facility] PROGRESS NOTE  John Shein Sr. IRJ:[id number] DOB: Sep 07, 1951 DOA: 07/19/2018 PCP: Patient, No Pcp Per  Narrative:  67 year old male DM TY 2 Former smoker, daily drinker Prior history of accidental fall, seventh rib fracture 02/2008 Admitted initially [facility] 07/20/2018 3 months h/o first digit discoloration-possible PAD-apparently he dropped from his left midfoot and had some pain  Sent over to [facility] for evaluation by vascular surgery-placed on IV heparin-found to have critical limb ischemia left lower extremity with tissue loss monophasic dorsalis pedis-underwent arteriogram and VVS has performed toe amputation and transtibial amputation  Decompensated and had hypoxic respiratory failure 11/8 which was amenable to BiPAP-vascular surgery feels wound is nonhealing and is planning further procedures   A & Plan  Acute hypoxic respiratory failure-events noted 11/8 a.m.-concerning on x-ray for aspiration versus pulmonary edema-was transiently on BiPAP and is now much better D-dimer elevated do not think this is however pulmonary embolus-continued on empiric cefepime and vancomycin Labs are pending I will get a two-view x-ray to confirm diagnosis Do not think this is heart failure he is -3.4 L  Severe PAD great toe gangrene-angiogram 07/25/2018 performed-left common iliac stent, left SFA popliteal angioplasty stent-- toe amputation and transmetatarsal amputation Vascular surgery of opinion foot is not healing-planning BKA-called to discuss with Dr. Carlis Abbott timing and hemodynamic stability in addition to whether even a BKA would heal-appreciate callback when available cont tramadol 50 every 6 as needed, OxyIR 5-10 every 4 as needed moderate --no IV meds needed  Diabetes mellitus type 2-A1c 7.4-n.p.o. for procedure at this time keep on sliding scale coverage only Continue metformin 500 twice daily  Probable hypervolemic hypo-natremia-monitor trends  repeat labs a.m.  Hypokalemia-replace with K-Dur times 1 repeat labs a.m.  Hypertension-no meds at home amlodipine 10 mg Coreg 6.25 twice daily and hydralazine 50 every 8-continue PRN labetalol every 8  prn  Leukocytosis-likely secondary to postop state-BC pending from this a.m.  Hyperlipidemia continue statin  Chronic daily smoker patient has quit since the past 3 weeks  Chronic daily drinker stable at this time  Severe malnutrition BMI 21-outpatient management and monitoring  DVT prophylaxis: Lovenox code Status: Full family Communication: None disposition Plan: Inpatient   Adean Milosevic, MD  [facility] Direct contact: [phone] --Via amion app OR  --[email or web address]; password [email or web address]  7PM-7AM contact night coverage as above 08/02/2018, 8:49 AM  LOS: 12 days   Consultants:  Vascular  Procedures:  Now-multiple surgeries  Antimicrobials:  Vancomycin and ceftriaxone  Interval history/Subjective:  Having pain still Events noted overnight where he became hypoxic and was placed on BiPAP Now feels better and is satting well off of oxygen No fever no chills Does not think he fell asleep while eating No chest pain Wound examined and according to surgery does not look like it is amenable to healing   Objective:  Vitals:  Vitals:   08/02/18 0600 08/02/18 0833  BP: (!) 147/75   Pulse: 81 76  Resp: 18 15  Temp:    SpO2: 100% 100%    Exam:  Awake alert pleasant Chest is clinically clear no added sound NCAT S1-S2 no murmur Abdomen is soft nontender no rebound no guarding Left foot seems dusky and red at the areas around the wound  I have personally reviewed the following:   Labs:  Sodium down from 1 32-1 29  Potassium 3.2  BUN/creatinine 6/0.7  CBC is pending  NP 311, d-dimer 1.4  Imaging studies:  n  Medical tests:  n  Test discussed with performing physician:  n  Decision to obtain old records:  n  Review and summation of old  records:  y  Scheduled Meds: . amLODipine  10 mg Oral Daily  . aspirin EC  81 mg Oral Daily  . atorvastatin  20 mg Oral q1800  . carvedilol  6.25 mg Oral BID WC  . clopidogrel  75 mg Oral Q breakfast  . enoxaparin (LOVENOX) injection  40 mg Subcutaneous Q24H  . hydrALAZINE  50 mg Oral Q8H  . insulin aspart  0-9 Units Subcutaneous TID WC  . metFORMIN  500 mg Oral BID WC  . nicotine  14 mg Transdermal Daily  . sulfamethoxazole-trimethoprim  1 tablet Oral Q12H   Continuous Infusions: . sodium chloride 10 mL/hr at 07/26/18 0117  . ceFEPime (MAXIPIME) IV 2 g (08/02/18 0558)  . lactated ringers    . lactated ringers 50 mL/hr at 07/30/18 1241  . vancomycin 1,250 mg (08/02/18 0027)    Principal Problem:   PAD (peripheral artery disease) (HCC) Active Problems:   Cellulitis in diabetic foot ([place])   Diabetes mellitus, type II ([place])   Cellulitis   Dry gangrene ([place])   LOS: 12 days

## 2018-08-02 NOTE — Progress Notes (Signed)
[facility] [place] this am.  L TMA not healing accordingly.  Febrile overnight.   Objective (!) 147/75 81 98.7 F (37.1 C) (Axillary) 18 100%  Intake/Output Summary (Last 24 hours) at 08/02/2018 0743 Last data filed at 08/02/2018 0714 Gross per 24 hour  Intake 810 ml  Output 1150 ml  Net -340 ml    Palpable AT left LE L TMA with worsening cellulitis   Assessment/Planning: L TMA not healing.  Discussed options of additional antibiotics and observation vs BKA.  Patient wants to proceed with L BKA.  Will try and arrange OR this pm if respiratory status stable enough.   Marty Heck 08/02/2018 7:43 AM --  Laboratory Lab Results: Recent Labs    07/31/18 0312 08/01/18 0318  WBC 13.1* 13.3*  HGB 12.2* 11.1*  HCT 36.9* 33.2*  PLT 301 296   BMET Recent Labs    08/01/18 0318 08/02/18 0348  NA 132* 129*  K 3.4* 3.2*  CL 97* 95*  CO2 27 25  GLUCOSE 166* 172*  BUN 6* 6*  CREATININE 0.71 0.72  CALCIUM 8.4* 8.2*

## 2018-08-02 NOTE — Anesthesia Procedure Notes (Signed)
Anesthesia Regional Block: Femoral nerve block   Pre-Anesthetic Checklist: ,, timeout performed, Correct Patient, Correct Site, Correct Laterality, Correct Procedure, Correct Position, site marked, Risks and benefits discussed,  Surgical consent,  Pre-op evaluation,  At surgeon's request and post-op pain management  Laterality: Left  Prep: chloraprep       Needles:  Injection technique: Single-shot  Needle Type: Echogenic Stimulator Needle     Needle Length: 5cm  Needle Gauge: 22     Additional Needles:   Procedures:, nerve stimulator,,, ultrasound used (permanent image in chart),,,,   Nerve Stimulator or Paresthesia:  Response: quadraceps contraction, 0.45 mA,   Additional Responses:   Narrative:  Start time: 08/02/2018 2:30 PM End time: 08/02/2018 2:51 PM Injection made incrementally with aspirations every 5 mL.  Performed by: Personally  Anesthesiologist: Janeece Riggers, MD  Additional Notes: Functioning IV was confirmed and monitors were applied.  A 52mm 22ga Arrow echogenic stimulator needle was used. Sterile prep and drape,hand hygiene and sterile gloves were used. Ultrasound guidance: relevant anatomy identified, needle position confirmed, local anesthetic spread visualized around nerve(s)., vascular puncture avoided.  Image printed for medical record. Negative aspiration and negative test dose prior to incremental administration of local anesthetic. The patient tolerated the procedure well.

## 2018-08-02 NOTE — Transfer of Care (Signed)
Immediate Anesthesia Transfer of Care Note  Patient: John Laurence Sr.  Procedure(s) Performed: LEFT AMPUTATION BELOW KNEE (Left Leg Lower)  Patient Location: PACU  Anesthesia Type:General  And regional   Level of Consciousness: drowsy  Airway & Oxygen Therapy: Patient Spontanous Breathing and Patient connected to nasal cannula oxygen  Post-op Assessment: Report given to RN and Post -op Vital signs reviewed and stable  Post vital signs: Reviewed and stable  Last Vitals:  Vitals Value Taken Time  BP 97/55 08/02/2018  4:18 PM  Temp    Pulse 68 08/02/2018  4:20 PM  Resp 15 08/02/2018  4:20 PM  SpO2 94 % 08/02/2018  4:20 PM  Vitals shown include unvalidated device data.  Last Pain:  Vitals:   08/02/18 0545  TempSrc: Axillary  PainSc:       Patients Stated Pain Goal: 0 (46/80/32 1224)  Complications: No apparent anesthesia complications

## 2018-08-02 NOTE — Progress Notes (Addendum)
Pharmacy Antibiotic Note  John Pile Sr. is a 67 y.o. male on day # 14 Vancomycin and Cefepime for cellulitis and dry gangrene. S/p LLE revascularization on 10/30 and left great toe amputation on 10/31, then left transmetatarsal amputation on 11/5.      Last Vancomycin trough level 12 mcg/ml on 11/1 (goal 15-20 mcg/ml).   Renal function stable.  Tmax 101.2, WBC 11.7. Respiratory issues overnight.   Cellulitis noted worsened and now planning left BKA today.     Prior plan was to stop IV antibiotics after today but now to continue. Spoke with Dr.Samtani.  Plan:  Continue Vancomycin 1250 mg IV q12hrs  Continue Cefepime 2gm IV q12hrs  Follow renal function, clinical progress and antibiotic plans.  Will recheck Vanc trough level by 11/11 if vanc continues.  Height: (charted wrong pt.) Weight: 158 lb 8.2 oz (71.9 kg) IBW/kg (Calculated) : 75.26  Temp (24hrs), Avg:100.1 F (37.8 C), Min:98.7 F (37.1 C), Max:101.4 F (38.6 C)  Recent Labs  Lab 07/28/18 0340 07/29/18 0421 07/31/18 0312 08/01/18 0318 08/02/18 0348 08/02/18 0513 08/02/18 0740 08/02/18 0925  WBC  --  8.6 13.1* 13.3*  --   --   --  11.7*  CREATININE 0.64 0.62 0.77 0.71 0.72  --   --   --   LATICACIDVEN  --   --   --   --   --  0.9 0.9  --     Estimated Creatinine Clearance: 92.4 mL/min (by C-G formula based on SCr of 0.72 mg/dL).    Allergies  Allergen Reactions  . Tea Itching and Rash    Antimicrobials this admission:  Vancomycin 10/26 >>  Cefepime 10/26>>  Clindamycin x 1 on 10/26  Dose adjustments this admission:     10/29 Vanc trough = 10 on 1gm IV q12hrs> incr 1250 q12h     11/1 Vanc trough = 12 on 1250 mg IV q12h, no change  Microbiology results:  10/26 blood x 2 - negative  Thank you for allowing pharmacy to be a part of this patient's care.  Arty Baumgartner, [place] Pager: [phone] or phone: [phone] 08/02/2018 12:46 PM

## 2018-08-03 ENCOUNTER — Encounter (HOSPITAL_COMMUNITY): Payer: Self-pay | Admitting: Vascular Surgery

## 2018-08-03 ENCOUNTER — Inpatient Hospital Stay (HOSPITAL_COMMUNITY): Payer: Medicare Other

## 2018-08-03 LAB — BASIC METABOLIC PANEL
ANION GAP: 6 (ref 5–15)
BUN: 15 mg/dL (ref 8–23)
CALCIUM: 8 mg/dL — AB (ref 8.9–10.3)
CO2: 26 mmol/L (ref 22–32)
CREATININE: 0.88 mg/dL (ref 0.61–1.24)
Chloride: 100 mmol/L (ref 98–111)
GFR calc Af Amer: >60 mL/min (ref >60)
GFR calc non Af Amer: >60 mL/min (ref >60)
Glucose, Bld: 288 mg/dL — ABNORMAL HIGH (ref 70–99)
Potassium: 4.1 mmol/L (ref 3.5–5.1)
SODIUM: 132 mmol/L — AB (ref 135–145)

## 2018-08-03 LAB — CBC WITH DIFFERENTIAL/PLATELET
BASOS ABS: 0 10*3/uL (ref 0.0–0.1)
BASOS PCT: 0 %
Band Neutrophils: 1 %
EOS ABS: 0 10*3/uL (ref 0.0–0.5)
EOS PCT: 0 %
HCT: 26 % — ABNORMAL LOW (ref 39.0–52.0)
HEMOGLOBIN: 8.9 g/dL — AB (ref 13.0–17.0)
LYMPHS ABS: 0.3 10*3/uL — AB (ref 0.7–4.0)
Lymphocytes Relative: 3 %
MCH: 30.2 pg (ref 26.0–34.0)
MCHC: 34.2 g/dL (ref 30.0–36.0)
MCV: 88.1 fL (ref 80.0–100.0)
Monocytes Absolute: 0.3 10*3/uL (ref 0.1–1.0)
Monocytes Relative: 3 %
NEUTROS PCT: 93 %
NRBC: 0 % (ref 0.0–0.2)
NRBC: 0 /100{WBCs}
Neutro Abs: 9.6 10*3/uL — ABNORMAL HIGH (ref 1.7–7.7)
Platelets: 312 10*3/uL (ref 150–400)
RBC: 2.95 MIL/uL — AB (ref 4.22–5.81)
RDW: 12.8 % (ref 11.5–15.5)
WBC: 10.2 10*3/uL (ref 4.0–10.5)

## 2018-08-03 LAB — GLUCOSE, CAPILLARY
Glucose-Capillary: 247 mg/dL — ABNORMAL HIGH (ref 70–99)
Glucose-Capillary: 223 mg/dL — ABNORMAL HIGH (ref 70–99)
Glucose-Capillary: 214 mg/dL — ABNORMAL HIGH (ref 70–99)
Glucose-Capillary: 136 mg/dL — ABNORMAL HIGH (ref 70–99)

## 2018-08-03 NOTE — Progress Notes (Signed)
[facility] [place]  Subjective  - POD#1 s/p L BKA.  Complains of some numbness in left thigh.  No other acute events.   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  Palpable popliteal pulse.  Suspect numbness may be related to regional block.  Will d/c dressing tomorrow.  Continue antibiotics for another 24-48 hours given cellulitis in TMA yesterday at time of BKA.   Marty Heck 08/03/2018 8:23 AM --  Marty Heck, MD [facility] [place] Office: [phone] Pager: [place]

## 2018-08-03 NOTE — Progress Notes (Signed)
[facility] PROGRESS NOTE  John Que Sr. [id number] DOB: 1951-07-25 DOA: 07/19/2018 PCP: Patient, No Pcp Per  Narrative:  67 year old male DM TY 2 Former smoker, daily drinker Prior history of accidental fall, seventh rib fracture 02/2008 Admitted [facility] 07/20/2018 3 months h/o first digit discoloration-possible PAD-accident to left midfoot and had some pain  Sent over to [facility] for evaluation by vascular surgery-placed on IV heparin-found to have critical limb ischemia left lower extremity with tissue loss monophasic dorsalis pedis-underwent arteriogram and VVS has performed toe amputation and transtibial amputation  Decompensated and had hypoxic respiratory failure 11/8 which was amenable to BiPAP-vascular surgery re-evalauted and patient underwent eventual BKA   A & Plan  Acute hypoxic respiratory failure-events noted 11/8 a.m.-concerning on x-ray for aspiration versus pulmonary edema-was transiently on BiPAP and is now much better Unlikely pulm embolism or HF despite D dimer -continued on empiric cefepime and vancomycin Rpt 2 vw cxr 11/10 am  Severe PAD great toe gangrene-angiogram 07/25/2018 performed-left common iliac stent, left SFA popliteal angioplasty stent-- toe amputation and transmetatarsal amputation Vascular surgery of opinion foot is not healing-BKA performed 11/9 Pain still rated at 9/10-PCA satarted per suregery-will d/c Oxycodone and tramadol for now and resume likely with PCA use data Oral opiate equivalents for more durable pain control  Diabetes mellitus type 2-A1c 7.4-not controlled in 240 range-change sensitive scale to moderate scale and Continue metformin 500 twice daily  Probable hypervolemic hypo-natremia-stable this am  Hypokalemia-replace with K-Dur times 1 repeat labs a.m.  Hypertension-no meds at home amlodipine 10 mg Coreg 6.25 twice daily and hydralazine 50 every 8-continue PRN labetalol every 8   prn  Hyperlipidemia continue statin  Chronic daily smoker patient has quit since the past 3 weeks  Chronic daily drinker stable at this time  Severe malnutrition BMI 21-outpatient management and monitoring  DVT prophylaxis: Lovenox code Status: Full family Communication: None-he has communicated with his family disposition Plan: Inpatient--we will need to monitor for several more days and ensure that no further surgical needs are present He will most l;ikely [facility] skilled nursing placement   Verlon Au, MD  [facility] Direct contact: [phone] --Via amion app OR  --[email or web address]; password [email or web address]  7PM-7AM contact night coverage as above 08/03/2018, 8:10 AM  LOS: 13 days   Consultants:  Vascular  Procedures:  Now-multiple surgeries  Antimicrobials:  Vancomycin and ceftriaxone  Interval history/Subjective:  9/10 pain On PCA sats in mid 90's No cp States numb to thigh and foot-wound nmot examined but pulses felt per VVS  Objective:  Vitals:  Vitals:   08/03/18 0800 08/03/18 0804  BP: 133/74   Pulse: 80   Resp: 19 (!) 23  Temp:    SpO2: 94% 94%    Exam:  Awake alert pleasant ion nad Chest is clinically clear no added sound Throat soft supple Very poor dentition NCAT S1-S2 no murmur Abdomen is soft nontender no rebound no guarding Left foot seems dusky and red at the areas around the wound R foot has no deficit  I have personally reviewed the following:   Labs:  Sodium down from 132  Potassium 3.2->4.1  BUN/creatinine 6/0.7  NP 311, d-dimer 1.4  Imaging studies:  n  Medical tests:  n   Test discussed with performing physician:  n  Decision to obtain old records:  n  Review and summation of old records:  y  Scheduled Meds: . amLODipine  10 mg Oral Daily  . aspirin EC  81 mg  Oral Daily  . atorvastatin  20 mg Oral q1800  . carvedilol  6.25 mg Oral BID WC  . clopidogrel  75 mg Oral Q breakfast  . enoxaparin  (LOVENOX) injection  40 mg Subcutaneous Q24H  . hydrALAZINE  50 mg Oral Q8H  . HYDROmorphone   Intravenous Q4H  . insulin aspart  0-9 Units Subcutaneous TID WC  . metFORMIN  500 mg Oral BID WC  . nicotine  14 mg Transdermal Daily  . potassium chloride  40 mEq Oral Daily   Continuous Infusions: . sodium chloride 10 mL/hr at 07/26/18 0117  . ceFEPime (MAXIPIME) IV 2 g (08/03/18 0414)  . lactated ringers    . lactated ringers 50 mL/hr at 07/30/18 1241  . lactated ringers    . vancomycin Stopped (08/03/18 0216)    Principal Problem:   PAD (peripheral artery disease) (HCC) Active Problems:   Cellulitis in diabetic foot ([place])   Diabetes mellitus, type II ([place])   Cellulitis   Dry gangrene ([place])   LOS: 13 days

## 2018-08-03 NOTE — Progress Notes (Signed)
Patient resting comfortably. BIPAP is not needed at this time.

## 2018-08-03 NOTE — Evaluation (Signed)
Physical Therapy Re-Evaluation Patient Details Name: John Hantz Sr. 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 Goals remain appropriate. Presenting with decreased functional mobility secondary to impulsivity, decreased safety awareness, and balance impairments. Performing transfers with moderate assistance and limited ambulation with walker and minimal assistance. Recommending CIR to maximize functional independence and decrease caregiver burden. Suspect patient will progress well based on motivation and PLOF. Will need continued transfer, gait and balance training in addition to reinforcement for safety with mobility. Will follow acutely.     Follow Up Recommendations CIR;Supervision/Assistance - 24 hour    Equipment Recommendations  Rolling walker with 5" wheels;Wheelchair (measurements PT)    Recommendations for Other Services Rehab consult     Precautions / Restrictions Precautions Precautions: Fall Precaution Comments: impulsive Restrictions Weight Bearing Restrictions: No Other Position/Activity Restrictions: L BKA      Mobility  Bed Mobility Overal bed mobility: Needs Assistance Bed Mobility: Supine to Sit     Supine to sit: Supervision     General bed mobility comments: HOB up, assist for lines, supervision for safety  Transfers Overall transfer level: Needs assistance Equipment used: Rolling walker (2 wheeled) Transfers: Sit to/from Stand Sit to Stand: Mod assist         General transfer comment: assist to rise and steady from elevated bed, verbal cues for hand placement, pt attempting to stand impulsively  Ambulation/Gait Ambulation/Gait assistance: Min assist    Assistive device: Rolling walker (2 wheeled)       General Gait Details: Min assist for pivotal hops from bed to chair. Provided cues for walker proximity (pt tending to hop past frame of walker).   Stairs            Wheelchair Mobility    Modified Rankin (Stroke Patients Only)       Balance Overall balance assessment: Needs assistance   Sitting balance-Leahy Scale: Fair     Standing balance support: Bilateral upper extremity supported Standing balance-Leahy Scale: Poor Standing balance comment: reliant on B UEs and external support                             Pertinent Vitals/Pain Pain Assessment: No/denies pain Pain Location: Numbness left medial thigh    Home Living Family/patient expects to be discharged to:: Private residence Living Arrangements: Spouse/significant other Available Help at Discharge: Available PRN/intermittently;Family(wife works) Type of Home: House Home Access: Other (comment)(land lord has agreed to build a ramp)     Home Layout: One level Home Equipment: Cane - single point      Prior Function Level of Independence: Independent with assistive device(s)         Comments: tub only no shower so he does sponge baths     Hand Dominance   Dominant Hand: Right    Extremity/Trunk Assessment   Upper Extremity Assessment Upper Extremity Assessment: Overall WFL for tasks assessed    Lower Extremity Assessment Lower Extremity Assessment: Defer to PT evaluation(s/p L BKA)    Cervical / Trunk Assessment Cervical / Trunk Assessment: Normal  Communication   Communication: No difficulties  Cognition Arousal/Alertness: Awake/alert Behavior During Therapy: Impulsive Overall Cognitive Status: Impaired/Different from baseline Area of Impairment: Safety/judgement;Memory;Problem solving  Memory: Decreased short-term memory   Safety/Judgement: Decreased awareness of safety;Decreased awareness  of deficits   Problem Solving: Difficulty sequencing;Requires verbal cues        General Comments      Exercises Amputee Exercises Quad Sets: 10 reps;Left;Seated   Assessment/Plan    PT Assessment    PT Problem List         PT Treatment Interventions      PT Goals (Current goals can be found in the Care Plan section)  Acute Rehab PT Goals Patient Stated Goal: to return home Potential to Achieve Goals: Good    Frequency Min 3X/week   Barriers to discharge        Co-evaluation PT/OT/SLP Co-Evaluation/Treatment: Yes Reason for Co-Treatment: For patient/therapist safety;To address functional/ADL transfers PT goals addressed during session: Mobility/safety with mobility;Balance;Proper use of DME;Strengthening/ROM         AM-PAC PT "6 Clicks" Daily Activity  Outcome Measure Difficulty turning over in bed (including adjusting bedclothes, sheets and blankets)?: None Difficulty moving from lying on back to sitting on the side of the bed? : None Difficulty sitting down on and standing up from a chair with arms (e.g., wheelchair, bedside commode, etc,.)?: Unable Help needed moving to and from a bed to chair (including a wheelchair)?: A Little Help needed walking in hospital room?: A Little Help needed climbing 3-5 steps with a railing? : A Lot 6 Click Score: 17    End of Session Equipment Utilized During Treatment: Gait belt Activity Tolerance: Patient tolerated treatment well Patient left: in chair;with call bell/phone within reach Nurse Communication: Mobility status PT Visit Diagnosis: Unsteadiness on feet (R26.81)    Time: 3267-1245 PT Time Calculation (min) (ACUTE ONLY): 23 min   Charges:   PT Evaluation $PT Re-evaluation: 1 Re-eval          Ellamae Sia, PT, DPT [place] Pager [phone] Office [phone]   Willy Eddy 08/03/2018, 9:30 AM

## 2018-08-03 NOTE — Evaluation (Addendum)
Occupational Therapy Re-Evaluation Patient Details Name: John Feister Sr. 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 PMhx: HTN, DM, arthritis   Clinical Impression   Pt was ambulating with a cane and independent in self care prior to admission. He presents with impulsivity and decreased safety and awareness of deficits s/p BKA. Pt is alone during the day as his wife works, but has excellent potential to return to a modified independent level with intensive rehab. Recommending CIR. Will follow acutely.    Follow Up Recommendations  CIR    Equipment Recommendations  3 in 1 bedside commode;Tub/shower bench;Wheelchair (measurements OT);Wheelchair cushion (measurements OT)    Recommendations for Other Services Rehab consult     Precautions / Restrictions Precautions Precautions: Fall Precaution Comments: impulsive Restrictions Weight Bearing Restrictions: No Other Position/Activity Restrictions: L BKA      Mobility Bed Mobility Overal bed mobility: Needs Assistance Bed Mobility: Supine to Sit     Supine to sit: Supervision     General bed mobility comments: HOB up, assist for lines, supervision for safety  Transfers Overall transfer level: Needs assistance Equipment used: Rolling walker (2 wheeled) Transfers: Sit to/from Stand Sit to Stand: Mod assist         General transfer comment: assist to rise and steady from elevated bed, verbal cues for hand placement, pt attempting to stand impulsively    Balance Overall balance assessment: Needs assistance   Sitting balance-Leahy Scale: Fair     Standing balance support: Bilateral upper extremity supported Standing balance-Leahy Scale: Poor Standing balance comment: reliant on B UEs and external support                            ADL either performed or assessed with clinical judgement   ADL Overall ADL's : Needs assistance/impaired Eating/Feeding: Set up;Sitting   Grooming: Set up;Sitting   Upper Body Bathing: Set up;Sitting   Lower Body Bathing: Moderate assistance;Sitting/lateral leans   Upper Body Dressing : Set up;Sitting   Lower Body Dressing: Moderate assistance;Sitting/lateral leans   Toilet Transfer: Minimal assistance;Ambulation;RW;BSC   Toileting- Clothing Manipulation and Hygiene: Moderate assistance;Sitting/lateral lean       Functional mobility during ADLs: Minimal assistance;Rolling walker;Cueing for sequencing;Cueing for safety General ADL Comments: pt instructed in how to manage phantom pain and in compensatory strategies for LB ADL avoiding standing     Vision Baseline Vision/History: Wears glasses Wears Glasses: At all times Patient Visual Report: No change from baseline       Perception     Praxis      Pertinent Vitals/Pain Pain Assessment: No/denies pain     Hand Dominance Right   Extremity/Trunk Assessment Upper Extremity Assessment Upper Extremity Assessment: Overall WFL for tasks assessed   Lower Extremity Assessment Lower Extremity Assessment: Defer to PT evaluation(s/p L BKA)   Cervical / Trunk Assessment Cervical / Trunk Assessment: Normal   Communication Communication Communication: No difficulties   Cognition Arousal/Alertness: Awake/alert Behavior During Therapy: Impulsive Overall Cognitive Status: Impaired/Different from baseline Area of Impairment: Safety/judgement;Memory;Problem solving                     Memory: Decreased short-term memory   Safety/Judgement: Decreased awareness of safety;Decreased awareness of deficits   Problem Solving: Difficulty sequencing;Requires verbal cues  General Comments       Exercises     Shoulder Instructions      Home Living Family/patient expects to be discharged to::  Private residence Living Arrangements: Spouse/significant other Available Help at Discharge: Available PRN/intermittently;Family(wife works) Type of Home: House Home Access: Other (comment)(land lord has agreed to build a ramp)     Home Layout: One level     Bathroom Shower/Tub: Tub only   Biochemist, clinical: Standard     Home Equipment: Cane - single point          Prior Functioning/Environment Level of Independence: Independent with assistive device(s)        Comments: tub only no shower Case he does sponge baths        OT Problem List: Impaired balance (sitting and/or standing);Decreased safety awareness;Decreased knowledge of use of DME or AE      OT Treatment/Interventions: Self-care/ADL training;DME and/or AE instruction;Therapeutic activities;Balance training;Patient/family education    OT Goals(Current goals can be found in the care plan section) Acute Rehab OT Goals Patient Stated Goal: to return home OT Goal Formulation: With patient Time For Goal Achievement: 08/16/18 Potential to Achieve Goals: Good ADL Goals Pt Will Perform Grooming: (P) with modified independence;sitting(at sink) Pt Will Perform Lower Body Dressing: (P) with modified independence;sitting/lateral leans Pt Will Transfer to Toilet: (P) ambulating;bedside commode;with modified independence Pt Will Perform Toileting - Clothing Manipulation and hygiene: (P) with modified independence;sitting/lateral leans  OT Frequency: Min 2X/week   Barriers to D/C: Decreased caregiver support          Co-evaluation PT/OT/SLP Co-Evaluation/Treatment: Yes Reason for Co-Treatment: For patient/therapist safety          AM-PAC PT "6 Clicks" Daily Activity     Outcome Measure Help from another person eating meals?: None Help from another person taking care of personal grooming?: A Little Help from another person toileting, which includes using toliet, bedpan, or urinal?: A Little Help from another person  bathing (including washing, rinsing, drying)?: A Little Help from another person to put on and taking off regular upper body clothing?: None Help from another person to put on and taking off regular lower body clothing?: A Little 6 Click Score: 20   End of Session Equipment Utilized During Treatment: Rolling walker;Gait belt  Activity Tolerance: Patient tolerated treatment well Patient left: in chair;with call bell/phone within reach;with chair alarm set  OT Visit Diagnosis: Unsteadiness on feet (R26.81);Other abnormalities of gait and mobility (R26.89);Other symptoms and signs involving cognitive function                Time: 5462-7035 OT Time Calculation (min): 27 min Charges:  OT General Charges $OT Visit: 1 Visit OT Re Evaluation   Nestor Lewandowsky, OTR/L Acute Rehabilitation Services Pager: [phone] Office: [phone]  [name] 08/03/2018, 9:20 AM

## 2018-08-04 ENCOUNTER — Inpatient Hospital Stay (HOSPITAL_COMMUNITY): Payer: Medicare Other

## 2018-08-04 DIAGNOSIS — I739 Peripheral vascular disease, unspecified: Secondary | ICD-10-CM

## 2018-08-04 DIAGNOSIS — Z89512 Acquired absence of left leg below knee: Secondary | ICD-10-CM

## 2018-08-04 LAB — COMPREHENSIVE METABOLIC PANEL
ALBUMIN: 2.1 g/dL — AB (ref 3.5–5.0)
ALK PHOS: 53 U/L (ref 38–126)
ALT: 28 U/L (ref 0–44)
ANION GAP: 6 (ref 5–15)
AST: 47 U/L — ABNORMAL HIGH (ref 15–41)
BILIRUBIN TOTAL: 0.8 mg/dL (ref 0.3–1.2)
BUN: 11 mg/dL (ref 8–23)
CALCIUM: 7.7 mg/dL — AB (ref 8.9–10.3)
CO2: 25 mmol/L (ref 22–32)
Chloride: 101 mmol/L (ref 98–111)
Creatinine, Ser: 0.74 mg/dL (ref 0.61–1.24)
GLUCOSE: 165 mg/dL — AB (ref 70–99)
POTASSIUM: 3.5 mmol/L (ref 3.5–5.1)
Sodium: 132 mmol/L — ABNORMAL LOW (ref 135–145)
TOTAL PROTEIN: 5.8 g/dL — AB (ref 6.5–8.1)

## 2018-08-04 LAB — CBC WITH DIFFERENTIAL/PLATELET
ABS IMMATURE GRANULOCYTES: 0.06 10*3/uL (ref 0.00–0.07)
Basophils Absolute: 0.1 10*3/uL (ref 0.0–0.1)
Basophils Relative: 1 %
EOS PCT: 1 %
Eosinophils Absolute: 0.1 10*3/uL (ref 0.0–0.5)
HCT: 25.6 % — ABNORMAL LOW (ref 39.0–52.0)
Hemoglobin: 8.5 g/dL — ABNORMAL LOW (ref 13.0–17.0)
Immature Granulocytes: 1 %
LYMPHS ABS: 2.3 10*3/uL (ref 0.7–4.0)
LYMPHS PCT: 23 %
MCH: 29.2 pg (ref 26.0–34.0)
MCHC: 33.2 g/dL (ref 30.0–36.0)
MCV: 88 fL (ref 80.0–100.0)
MONO ABS: 1.1 10*3/uL — AB (ref 0.1–1.0)
Monocytes Relative: 11 %
Neutro Abs: 6.2 10*3/uL (ref 1.7–7.7)
Neutrophils Relative %: 63 %
Platelets: 372 10*3/uL (ref 150–400)
RBC: 2.91 MIL/uL — ABNORMAL LOW (ref 4.22–5.81)
RDW: 12.9 % (ref 11.5–15.5)
WBC: 9.7 10*3/uL (ref 4.0–10.5)
nRBC: 0 % (ref 0.0–0.2)

## 2018-08-04 LAB — GLUCOSE, CAPILLARY
GLUCOSE-CAPILLARY: 174 mg/dL — AB (ref 70–99)
GLUCOSE-CAPILLARY: 176 mg/dL — AB (ref 70–99)
Glucose-Capillary: 190 mg/dL — ABNORMAL HIGH (ref 70–99)

## 2018-08-04 MED ORDER — HYDROMORPHONE HCL 2 MG PO TABS: 2.0000 mg | ORAL_TABLET | ORAL | Status: DC | PRN

## 2018-08-04 NOTE — Consult Note (Signed)
Physical Medicine and Rehabilitation Consult Reason for Consult: Decreased functional mobility and ADLs after below-knee amputation left side postoperative day #2 Referring Phsyician: Dr. Leo Grosser Sr. is an 67 y.o. male.   HPI: 67 year old male with type 2 diabetes who had a 37-month history of left foot pain.  The first digit on the left foot became black and discolored.  He presented through the emergency department at [facility] on 07/20/2018.  X-rays showed no evidence of osteomyelitis.  Patient was transferred to [facility] for gangrene of the left great toe and cellulitis.  The patient was evaluated by vascular surgery on 07/20/2018.  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 Carlis Abbott. PT evaluation 08/03/2018, min assist ambulation with a rolling walker OT evaluation 08/03/2018, mod assist lower body ADLs set up for upper body ADLs, mod assist toileting Review of Systems -Review of Systems  Constitutional: Negative.   HENT: Negative for ear pain, hearing loss, nosebleeds and tinnitus.   Eyes: Negative for blurred vision, double vision and redness.  Respiratory: Negative for cough, sputum production, shortness of breath and wheezing.   Cardiovascular: Negative for chest pain and palpitations.  Gastrointestinal: Positive for constipation. Negative for heartburn and nausea.  Genitourinary: Negative for dysuria and urgency.  Musculoskeletal: Positive for joint pain.  Skin: Negative.   Neurological: Negative for dizziness, loss of consciousness and  headaches.  Psychiatric/Behavioral: Negative for depression and memory loss.    Past Medical History:  Diagnosis Date  . Arthritis   . Diabetes mellitus without complication ([place])   . Emesis   . Heart murmur   . Hyperglycemia   . Hypertension   . Joint pain    Past Surgical History:  Procedure Laterality Date  . AMPUTATION Left 08/02/2018   Procedure: LEFT AMPUTATION BELOW KNEE;  Surgeon: Marty Heck, MD;  Location: [place];  Service: Vascular;  Laterality: Left;  . ELBOW SURGERY     Bone graft to repair elbow  . LOWER EXTREMITY ANGIOGRAPHY N/A 07/24/2018   Procedure: LOWER EXTREMITY ANGIOGRAPHY;  Surgeon: Marty Heck, MD;  Location: [place] [facility];  Service: Cardiovascular;  Laterality: N/A;  . PERIPHERAL VASCULAR INTERVENTION  07/24/2018   Procedure: PERIPHERAL VASCULAR INTERVENTION;  Surgeon: Marty Heck, MD;  Location: [place] [facility];  Service: Cardiovascular;;  . TONSILLECTOMY    . TRANSMETATARSAL AMPUTATION Left 07/25/2018   Procedure: LEFT GREAT TOE AMPUTATION;  Surgeon: Marty Heck, MD;  Location: [place];  Service: Vascular;  Laterality: Left;  . TRANSMETATARSAL AMPUTATION Left 07/30/2018   Procedure: TRANSMETATARSAL AMPUTATION;  Surgeon: Waynetta Sandy, MD;  Location: [facility] OR;  Service: Vascular;  Laterality: Left;   Family History  Problem Relation Age of Onset  . Cancer Sister    Social History:  reports that he has been smoking cigarettes. He has been smoking about 0.50 packs per day. He quit smokeless tobacco use about 29 years ago.  His smokeless tobacco use included snuff. He reports that he drinks about 12.0 standard drinks of alcohol per week. He reports that he does not use drugs. Allergies:  Allergies  Allergen Reactions  .  Tea Itching and Rash   No medications prior to admission.    Home: Home Living Family/patient expects to be discharged to:: Private residence Living Arrangements: Spouse/significant  other Available Help at Discharge: Available PRN/intermittently, Family(wife works) Type of Home: House Home Access: Other (comment)(land lord has agreed to build a ramp) Technical brewer of Steps: 5 Entrance Stairs-Rails: Can reach both Home Layout: One level Bathroom Shower/Tub: Tub only Biochemist, clinical: Standard Bathroom Accessibility: Yes Home Equipment: Cane - single point  Functional History: Prior Function Comments: tub only no shower so he does sponge baths Functional Status:  Mobility:     Ambulation/Gait Gait Distance (Feet): 10 Feet Gait velocity: decr General Gait Details: Min assist for pivotal hops from bed to chair. Provided cues for walker proximity (pt tending to hop past frame of walker).     ADL: ADL Grooming Details (indicate cue type and reason): minimal A for standing balance at sink Lower Body Dressing Details (indicate cue type and reason): can don Darco shoe and manage pants after toileting with min guard assist for standing balance Toilet Transfer Details (indicate cue type and reason): 3n1 over toilet. simulated with walking to/from bathroom and recliner transfer. Min A for LOB on turns. Pt reporting increased pain in L heel during ambulation  Cognition: Cognition Overall Cognitive Status: Impaired/Different from baseline Orientation Level: Oriented X4 Cognition Arousal/Alertness: Awake/alert Behavior During Therapy: Impulsive Overall Cognitive Status: Impaired/Different from baseline Area of Impairment: Safety/judgement, Memory, Problem solving Memory: Decreased short-term memory Safety/Judgement: Decreased awareness of safety, Decreased awareness of deficits Problem Solving: Difficulty sequencing, Requires verbal cues  Blood pressure (!) 157/78, pulse 85, temperature 98.3 F (36.8 C), temperature source Oral, resp. rate 16, height 5' 10.98" (1.803 m), weight 70.9 kg, SpO2 99 %. Physical Exam  Nursing note and vitals  reviewed. Constitutional: He is oriented to person, place, and time. He appears well-developed and well-nourished.  HENT:  Head: Normocephalic and atraumatic.  Eyes: Pupils are equal, round, and reactive to light. Conjunctivae and EOM are normal.  Neck: Normal range of motion.  Cardiovascular: Normal rate, regular rhythm and normal heart sounds.  No murmur heard. Respiratory: Effort normal and breath sounds normal. No respiratory distress.  GI: Soft. Bowel sounds are normal. He exhibits no distension. There is no tenderness.  Neurological: He is alert and oriented to person, place, and time.  Motor strength 5/5 bilateral deltoid bicep tricep grip as well as right hip flexor knee extensor ankle dorsiflexor 4/5 left hip flexion 3- left knee flexion and extension Sensation intact light touch bilateral upper limbs and right lower limb  Skin: Skin is warm and dry.  Psychiatric: He has a normal mood and affect.  Left BKA with bulky dressing and Ace wrap  Results for orders placed or performed during the hospital encounter of 07/19/18 (from the past 24 hour(s))  Glucose, capillary     Status: Abnormal   Collection Time: 08/03/18  4:20 PM  Result Value Ref Range   Glucose-Capillary 214 (H) 70 - 99 mg/dL  Glucose, capillary     Status: Abnormal   Collection Time: 08/03/18  8:44 PM  Result Value Ref Range   Glucose-Capillary 136 (H) 70 - 99 mg/dL  CBC with Differential/Platelet     Status: Abnormal   Collection Time: 08/04/18  3:17 AM  Result Value Ref Range   WBC 9.7 4.0 - 10.5 K/uL   RBC 2.91 (L) 4.22 - 5.81 MIL/uL   Hemoglobin 8.5 (L) 13.0 - 17.0 g/dL   HCT  25.6 (L) 39.0 - 52.0 %   MCV 88.0 80.0 - 100.0 fL   MCH 29.2 26.0 - 34.0 pg   MCHC 33.2 30.0 - 36.0 g/dL   RDW 12.9 11.5 - 15.5 %   Platelets 372 150 - 400 K/uL   nRBC 0.0 0.0 - 0.2 %   Neutrophils Relative % 63 %   Neutro Abs 6.2 1.7 - 7.7 K/uL   Lymphocytes Relative 23 %   Lymphs Abs 2.3 0.7 - 4.0 K/uL   Monocytes Relative  11 %   Monocytes Absolute 1.1 (H) 0.1 - 1.0 K/uL   Eosinophils Relative 1 %   Eosinophils Absolute 0.1 0.0 - 0.5 K/uL   Basophils Relative 1 %   Basophils Absolute 0.1 0.0 - 0.1 K/uL   Immature Granulocytes 1 %   Abs Immature Granulocytes 0.06 0.00 - 0.07 K/uL   Burr Cells PRESENT   Comprehensive metabolic panel     Status: Abnormal   Collection Time: 08/04/18  3:17 AM  Result Value Ref Range   Sodium 132 (L) 135 - 145 mmol/L   Potassium 3.5 3.5 - 5.1 mmol/L   Chloride 101 98 - 111 mmol/L   CO2 25 22 - 32 mmol/L   Glucose, Bld 165 (H) 70 - 99 mg/dL   BUN 11 8 - 23 mg/dL   Creatinine, Ser 0.74 0.61 - 1.24 mg/dL   Calcium 7.7 (L) 8.9 - 10.3 mg/dL   Total Protein 5.8 (L) 6.5 - 8.1 g/dL   Albumin 2.1 (L) 3.5 - 5.0 g/dL   AST 47 (H) 15 - 41 U/L   ALT 28 0 - 44 U/L   Alkaline Phosphatase 53 38 - 126 U/L   Total Bilirubin 0.8 0.3 - 1.2 mg/dL   GFR calc non Af Amer >60 >60 mL/min   GFR calc Af Amer >60 >60 mL/min   Anion gap 6 5 - 15   Dg Chest 2 View  Result Date: 08/03/2018 CLINICAL DATA:  Heart murmur.  Follow-up abnormal chest radiography EXAM: CHEST - 2 VIEW COMPARISON:  03/01/2016.  08/02/2018. FINDINGS: Borderline cardiomegaly as seen previously. Atherosclerotic calcification of the thoracic aorta. Patchy bilateral pulmonary density right worse than left which could be due to edema or bronchopneumonia. Small bilateral pleural effusions with dependent atelectasis. I think the findings are slightly improved compared to yesterday, with exception that the effusions are slightly larger. IMPRESSION: Bilateral patchy pulmonary density right more than left, favored to represent edema over pneumonia. Slight improvement since yesterday. Small effusions have enlarged. Electronically Signed   By: Nelson Chimes M.D.   On: 08/03/2018 13:07    Assessment/Plan: Diagnosis: Peripheral arterial disease status post left below-knee amputation postoperative day #2 1. Does the need for close, 24 hr/day  medical supervision in concert with the patient's rehab needs make it unreasonable for this patient to be served in a less intensive setting? Yes 2. Co-Morbidities requiring supervision/potential complications: Diabetes, hypertension 3. Due to bladder management, bowel management, safety, skin/wound care, disease management, medication administration, pain management and patient education, does the patient require 24 hr/day rehab nursing? Yes 4. Does the patient require coordinated care of a physician, rehab nurse, PT (1-2 hrs/day, 5 days/week) and OT (1-2 hrs/day, 5 days/week) to address physical and functional deficits in the context of the above medical diagnosis(es)? Yes Addressing deficits in the following areas: balance, endurance, locomotion, strength, transferring, bowel/bladder control, bathing, dressing, feeding, grooming, toileting, cognition and psychosocial support 5. Can the patient actively participate in an intensive  therapy program of at least 3 hrs of therapy per day at least 5 days per week? Yes 6. The potential for patient to make measurable gains while on inpatient rehab is good 7. Anticipated functional outcomes upon discharge from inpatients are mod I PT, mod I OT, NASLP 8. Estimated rehab length of stay to reach the above functional goals is: 7-10d 9. Does the patient have adequate social supports to accommodate these discharge functional goals? Yes 10. Anticipated D/C setting: Home 11. Anticipated post D/C treatments: [place] therapy 12. Overall Rehab/Functional Prognosis: excellent  RECOMMENDATIONS: This patient's condition is appropriate for continued rehabilitative care in the following setting: CIR Patient has agreed to participate in recommended program. Yes Note that insurance prior authorization may be required for reimbursement for recommended care.  Comment: Needs to be off of PCA and tolerating therapy using oral pain medications prior to rehab  Charlett Blake 08/04/2018

## 2018-08-04 NOTE — Progress Notes (Signed)
RN called to bedside pt dressing to L BKA oozing with bright red blood; pt up to bedside commode. VSS; dressing reinforced with Kerlex and ACE wrap.  1214 DR. Clark paged and aware. Orders to change dressing if needed.   Will follow orders and continue to monitor.  Lilla Shook, BSN

## 2018-08-04 NOTE — Progress Notes (Signed)
[facility]  Subjective  - POD#2 s/p L BKA.   No complaints.   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  Palpable popliteal pulse.  Dressing changed today and BKA looks great.  PT/OT, placement.  Discussed staples would stay for 3 weeks.   Marty Heck 08/04/2018 9:28 AM --  Marty Heck, MD [facility] [place] Office: [phone] Pager: [place]

## 2018-08-04 NOTE — Progress Notes (Addendum)
Pt with unwitnessed fall at 1636. Pt attempting to get to the phone without assistance. He landed on his bottom and has a skin tear to his L elbow, pt denies pain. Gauze dressing applied to L elbow.   Spoke with Dr. Verlon Au. Paged Dr. Carlis Abbott of [facility] to make him aware.   VSS post fall with T 97.8 F, BP 151/68, HR 86, O2 98% on room air. Pt given dinner time  Medications of Lipitor 20mg , Coreg 6.25, Metformin 500mg .   Fritz Pickerel, RN

## 2018-08-04 NOTE — Progress Notes (Signed)
Dilaudid PCA d'cd.28mL Dilaudid wasted in waste container witnessed by Caitlin,RN.   Arnell Sieving, RN, BSN

## 2018-08-04 NOTE — Progress Notes (Signed)
[facility]ESS [facility]  John Mitrano Sr. JEH:[id number] DOB: [birth year] DOA: 07/19/2018 PCP: Patient, No Pcp Per  Narrative:  67 year old male DM TY 2 Former smoker, daily drinker Prior history of accidental fall, seventh rib fracture 02/2008 Admitted [facility] 07/20/2018 3 months h/o first digit discoloration-possible PAD-accident to left midfoot and had some pain  Sent over to [facility] for evaluation by vascular surgery-placed on IV heparin-found to have critical limb ischemia left lower extremity with tissue loss monophasic dorsalis pedis-underwent arteriogram and [facility] has performed toe amputation and transtibial amputation  Decompensated and had hypoxic respiratory failure 11/8 which was amenable to BiPAP-vascular surgery re-evalauted and patient underwent eventual BKA   A & Plan  Acute hypoxic respiratory failure-events noted 11/8 a.m.-concerning on x-ray for aspiration versus pulmonary edema-was transiently on BiPAP and is now much better Unlikely pulm embolism or HF despite D dimer -continued on empiric cefepime and vancomycin Rpt 2 vw cxr 11/11 am  Severe PAD great toe gangrene-angiogram 07/25/2018 performed-left common iliac stent, left SFA popliteal angioplasty stent-- toe amputation and transmetatarsal amputation Vascular surgery of opinion foot is not healing-BKA performed 11/9 Pain still rated at 9/10-cannot determine from PCA orders and after discussion with pharmacy exactly how much is being used Patient also seems confused-doesn't know what PCA is for Changing to oral dilaudid 2 mg q 4 prn--augemnt if prn in next 1-2 days  Bleeding from site-repeat labs in am-dressings re-inforced and will recheck am labs  Diabetes mellitus type 2-A1c 7.4-now 165-170 on moderate scale and Continue metformin 500 twice daily  Probable hypervolemic hypo-natremia-stable this am  Hypokalemia-replace with K-Dur and resolved  Hypertension-no meds at home  amlodipine 10 mg Coreg 6.25 twice daily and hydralazine 50 every 8-continue PRN labetalol every 8  prn  Hyperlipidemia continue statin  Chronic daily smoker patient has quit since the past 3 weeks  Chronic daily drinker stable at this time  Severe malnutrition BMI 21-outpatient management and monitoring  DVT prophylaxis: Lovenox code Status: Full family Communication: Ruby Cola has communicated with his family disposition Plan: Inpatient--changing PCA to oral dialaudid Rpt cxr in am Nearing d/c if all stable  Verlon Au, MD  [facility] Direct contact: [phone] --Via amion app OR  --[email or web address]; password [email or web address]  7PM-7AM contact night coverage as above 08/04/2018, 1:19 PM  LOS: 14 days   Consultants:  Vascular  Procedures:  Now-multiple surgeries  Antimicrobials:  Vancomycin and ceftriaxone  Interval history/Subjective:  9/10 pain On PCA sats in mid 90's No cp States numb to thigh and foot-wound nmot examined but pulses felt per [facility]VS  Objective:  Vitals:  Vitals:   08/04/18 1206 08/04/18 1219  BP: (!) 152/89   Pulse: 79   Resp:  (!) 22  Temp: 98 F (36.7 C)   SpO2: 100% 100%    Exam:  Awake alert pleasant in nad Slight drowsy cta b Foot dressing L side re-inforced  I have personally reviewed the following:   Labs:  Sodium down from 132  Potassium 3.2->4.1->3.5  BUN/creatinine 6/0.7  Hemoglobin 8.5 down from 10.8 prior  NP 311, d-dimer 1.4  Imaging studies:  n  Medical tests:  n   Test discussed with performing physician:  n  Decision to obtain old records:  n  Review and summation of old records:  y  Scheduled Meds: . amLODipine  10 mg Oral Daily  . aspirin EC  81 mg Oral Daily  . atorvastatin  20 mg Oral q1800  . carvedilol  6.25 mg  Oral BID WC  . clopidogrel  75 mg Oral Q breakfast  . enoxaparin (LOVENOX) injection  40 mg Subcutaneous Q24H  . hydrALAZINE  50 mg Oral Q8H  . HYDROmorphone   Intravenous Q4H  .  metFORMIN  500 mg Oral BID WC  . nicotine  14 mg Transdermal Daily  . potassium chloride  40 mEq Oral Daily   Continuous Infusions: . sodium chloride 10 mL/hr at 07/26/18 0117  . ceFEPime (MAXIPIME) IV 2 g (08/04/18 0539)  . lactated ringers    . lactated ringers 50 mL/hr at 07/30/18 1241  . lactated ringers    . vancomycin Stopped (08/04/18 0146)    Principal Problem:   PAD (peripheral artery disease) (HCC) Active Problems:   Cellulitis in diabetic foot ([place])   Diabetes mellitus, type II ([place])   Cellulitis   Dry gangrene ([place])   LOS: 14 days

## 2018-08-05 ENCOUNTER — Other Ambulatory Visit: Payer: Self-pay

## 2018-08-05 ENCOUNTER — Inpatient Hospital Stay (HOSPITAL_COMMUNITY)
Admission: RE | Admit: 2018-08-05 | Discharge: 2018-08-10 | DRG: 560 | Disposition: A | Discharge status: Home Health | Payer: Medicare Other | Source: Intra-hospital | Attending: Physical Medicine & Rehabilitation | Admitting: Physical Medicine & Rehabilitation

## 2018-08-05 ENCOUNTER — Encounter (HOSPITAL_COMMUNITY): Payer: Self-pay | Admitting: *Deleted

## 2018-08-05 DIAGNOSIS — D62 Acute posthemorrhagic anemia: Secondary | ICD-10-CM | POA: Diagnosis present

## 2018-08-05 DIAGNOSIS — F17210 Nicotine dependence, cigarettes, uncomplicated: Secondary | ICD-10-CM | POA: Diagnosis present

## 2018-08-05 DIAGNOSIS — E1169 Type 2 diabetes mellitus with other specified complication: Secondary | ICD-10-CM

## 2018-08-05 DIAGNOSIS — I70202 Unspecified atherosclerosis of native arteries of extremities, left leg: Secondary | ICD-10-CM | POA: Diagnosis present

## 2018-08-05 DIAGNOSIS — E785 Hyperlipidemia, unspecified: Secondary | ICD-10-CM | POA: Diagnosis present

## 2018-08-05 DIAGNOSIS — I10 Essential (primary) hypertension: Secondary | ICD-10-CM | POA: Diagnosis present

## 2018-08-05 DIAGNOSIS — E1151 Type 2 diabetes mellitus with diabetic peripheral angiopathy without gangrene: Secondary | ICD-10-CM | POA: Diagnosis present

## 2018-08-05 DIAGNOSIS — G547 Phantom limb syndrome without pain: Secondary | ICD-10-CM | POA: Diagnosis present

## 2018-08-05 DIAGNOSIS — E1142 Type 2 diabetes mellitus with diabetic polyneuropathy: Secondary | ICD-10-CM | POA: Diagnosis present

## 2018-08-05 DIAGNOSIS — E876 Hypokalemia: Secondary | ICD-10-CM | POA: Diagnosis present

## 2018-08-05 DIAGNOSIS — Z89512 Acquired absence of left leg below knee: Secondary | ICD-10-CM

## 2018-08-05 DIAGNOSIS — Z4781 Encounter for orthopedic aftercare following surgical amputation: Secondary | ICD-10-CM | POA: Diagnosis present

## 2018-08-05 DIAGNOSIS — S88112A Complete traumatic amputation at level between knee and ankle, left lower leg, initial encounter: Secondary | ICD-10-CM

## 2018-08-05 DIAGNOSIS — Z720 Tobacco use: Secondary | ICD-10-CM

## 2018-08-05 DIAGNOSIS — F17200 Nicotine dependence, unspecified, uncomplicated: Secondary | ICD-10-CM

## 2018-08-05 LAB — GLUCOSE, CAPILLARY
GLUCOSE-CAPILLARY: 154 mg/dL — AB (ref 70–99)
GLUCOSE-CAPILLARY: 236 mg/dL — AB (ref 70–99)
Glucose-Capillary: 166 mg/dL — ABNORMAL HIGH (ref 70–99)
Glucose-Capillary: 164 mg/dL — ABNORMAL HIGH (ref 70–99)
Glucose-Capillary: 133 mg/dL — ABNORMAL HIGH (ref 70–99)

## 2018-08-05 LAB — CBC WITH DIFFERENTIAL/PLATELET
Abs Immature Granulocytes: 0.05 10*3/uL (ref 0.00–0.07)
BASOS ABS: 0.1 10*3/uL (ref 0.0–0.1)
Basophils Relative: 1 %
Eosinophils Absolute: 0.1 10*3/uL (ref 0.0–0.5)
Eosinophils Relative: 1 %
HCT: 30.3 % — ABNORMAL LOW (ref 39.0–52.0)
HEMOGLOBIN: 10 g/dL — AB (ref 13.0–17.0)
Immature Granulocytes: 1 %
LYMPHS PCT: 17 %
Lymphs Abs: 1.5 10*3/uL (ref 0.7–4.0)
MCH: 29 pg (ref 26.0–34.0)
MCHC: 33 g/dL (ref 30.0–36.0)
MCV: 87.8 fL (ref 80.0–100.0)
Monocytes Absolute: 1 10*3/uL (ref 0.1–1.0)
Monocytes Relative: 12 %
NEUTROS ABS: 5.8 10*3/uL (ref 1.7–7.7)
NRBC: 0 % (ref 0.0–0.2)
Neutrophils Relative %: 68 %
PLATELETS: 498 10*3/uL — AB (ref 150–400)
RBC: 3.45 MIL/uL — AB (ref 4.22–5.81)
RDW: 12.6 % (ref 11.5–15.5)
WBC: 8.5 10*3/uL (ref 4.0–10.5)

## 2018-08-05 LAB — BASIC METABOLIC PANEL
Anion gap: 5 (ref 5–15)
BUN: 5 mg/dL — ABNORMAL LOW (ref 8–23)
CO2: 27 mmol/L (ref 22–32)
CREATININE: 0.69 mg/dL (ref 0.61–1.24)
Calcium: 8.4 mg/dL — ABNORMAL LOW (ref 8.9–10.3)
Chloride: 103 mmol/L (ref 98–111)
GFR calc non Af Amer: >60 mL/min (ref >60)
Glucose, Bld: 199 mg/dL — ABNORMAL HIGH (ref 70–99)
Potassium: 3.7 mmol/L (ref 3.5–5.1)
SODIUM: 135 mmol/L (ref 135–145)

## 2018-08-05 MED ORDER — ASPIRIN EC 81 MG PO TBEC
81.0000 mg | DELAYED_RELEASE_TABLET | Freq: Every day | ORAL | Status: DC
  Administered 2018-08-06 – 2018-08-10 (×5): 81 mg via ORAL
  Filled 2018-08-05 (×5): qty 1

## 2018-08-05 MED ORDER — HYDRALAZINE HCL 50 MG PO TABS
50.0000 mg | ORAL_TABLET | Freq: Three times a day (TID) | ORAL | Status: DC
  Administered 2018-08-05 – 2018-08-10 (×12): 50 mg via ORAL
  Filled 2018-08-05 (×14): qty 1

## 2018-08-05 MED ORDER — ACETAMINOPHEN 325 MG PO TABS: 650.0000 mg | ORAL_TABLET | Freq: Four times a day (QID) | ORAL | 0 refills | Status: DC | PRN

## 2018-08-05 MED ORDER — HYDROMORPHONE HCL 2 MG PO TABS
2.0000 mg | ORAL_TABLET | ORAL | Status: DC | PRN
  Administered 2018-08-05 – 2018-08-09 (×5): 2 mg via ORAL
  Filled 2018-08-05 (×5): qty 1

## 2018-08-05 MED ORDER — CARVEDILOL 6.25 MG PO TABS
6.2500 mg | ORAL_TABLET | Freq: Two times a day (BID) | ORAL | Status: DC
  Administered 2018-08-06 – 2018-08-10 (×9): 6.25 mg via ORAL
  Filled 2018-08-05 (×9): qty 1

## 2018-08-05 MED ORDER — ASPIRIN 81 MG PO TBEC: 81.0000 mg | DELAYED_RELEASE_TABLET | Freq: Every day | ORAL | 0 refills | Status: DC

## 2018-08-05 MED ORDER — ATORVASTATIN CALCIUM 20 MG PO TABS
20.0000 mg | ORAL_TABLET | Freq: Every day | ORAL | Status: DC
  Administered 2018-08-06 – 2018-08-09 (×4): 20 mg via ORAL
  Filled 2018-08-05 (×4): qty 1

## 2018-08-05 MED ORDER — METFORMIN HCL 500 MG PO TABS
500.0000 mg | ORAL_TABLET | Freq: Two times a day (BID) | ORAL | Status: DC
  Administered 2018-08-06 – 2018-08-10 (×9): 500 mg via ORAL
  Filled 2018-08-05 (×9): qty 1

## 2018-08-05 MED ORDER — CARVEDILOL 6.25 MG PO TABS: 6.2500 mg | ORAL_TABLET | Freq: Two times a day (BID) | ORAL | Status: DC

## 2018-08-05 MED ORDER — AMLODIPINE BESYLATE 10 MG PO TABS: 10.0000 mg | ORAL_TABLET | Freq: Every day | ORAL | Status: DC

## 2018-08-05 MED ORDER — CLOPIDOGREL BISULFATE 75 MG PO TABS
75.0000 mg | ORAL_TABLET | Freq: Every day | ORAL | Status: DC
  Administered 2018-08-06 – 2018-08-10 (×5): 75 mg via ORAL
  Filled 2018-08-05 (×5): qty 1

## 2018-08-05 MED ORDER — METFORMIN HCL 500 MG PO TABS: 500.0000 mg | ORAL_TABLET | Freq: Two times a day (BID) | ORAL | Status: DC

## 2018-08-05 MED ORDER — ENOXAPARIN SODIUM 40 MG/0.4ML ~~LOC~~ SOLN
40.0000 mg | SUBCUTANEOUS | Status: DC
  Administered 2018-08-06 – 2018-08-10 (×5): 40 mg via SUBCUTANEOUS
  Filled 2018-08-05 (×5): qty 0.4

## 2018-08-05 MED ORDER — AMLODIPINE BESYLATE 10 MG PO TABS
10.0000 mg | ORAL_TABLET | Freq: Every day | ORAL | Status: DC
  Administered 2018-08-06 – 2018-08-10 (×5): 10 mg via ORAL
  Filled 2018-08-05 (×5): qty 1

## 2018-08-05 MED ORDER — SORBITOL 70 % SOLN: 30.0000 mL | Freq: Every day | Status: DC | PRN

## 2018-08-05 MED ORDER — ENOXAPARIN SODIUM 40 MG/0.4ML ~~LOC~~ SOLN: 40.0000 mg | SUBCUTANEOUS | Status: DC

## 2018-08-05 MED ORDER — ACETAMINOPHEN 325 MG PO TABS
650.0000 mg | ORAL_TABLET | Freq: Four times a day (QID) | ORAL | Status: DC | PRN
  Administered 2018-08-07 – 2018-08-08 (×3): 650 mg via ORAL
  Filled 2018-08-05 (×3): qty 2

## 2018-08-05 MED ORDER — HYDRALAZINE HCL 50 MG PO TABS: 50.0000 mg | ORAL_TABLET | Freq: Three times a day (TID) | ORAL | Status: DC

## 2018-08-05 MED ORDER — CLOPIDOGREL BISULFATE 75 MG PO TABS: 75.0000 mg | ORAL_TABLET | Freq: Every day | ORAL | Status: DC

## 2018-08-05 MED ORDER — NICOTINE 14 MG/24HR TD PT24: 14.0000 mg | MEDICATED_PATCH | Freq: Every day | TRANSDERMAL | 0 refills | Status: DC

## 2018-08-05 MED ORDER — HYDROCODONE-ACETAMINOPHEN 5-325 MG PO TABS: 1.0000 | ORAL_TABLET | Freq: Four times a day (QID) | ORAL | 0 refills | Status: DC | PRN

## 2018-08-05 MED ORDER — NICOTINE 14 MG/24HR TD PT24
14.0000 mg | MEDICATED_PATCH | Freq: Every day | TRANSDERMAL | Status: DC
  Administered 2018-08-06 – 2018-08-10 (×5): 14 mg via TRANSDERMAL
  Filled 2018-08-05 (×5): qty 1

## 2018-08-05 MED ORDER — POTASSIUM CHLORIDE CRYS ER 20 MEQ PO TBCR
40.0000 meq | EXTENDED_RELEASE_TABLET | Freq: Every day | ORAL | Status: DC
  Administered 2018-08-06 – 2018-08-10 (×5): 40 meq via ORAL
  Filled 2018-08-05 (×5): qty 2

## 2018-08-05 MED ORDER — ATORVASTATIN CALCIUM 20 MG PO TABS: 20.0000 mg | ORAL_TABLET | Freq: Every day | ORAL | Status: DC

## 2018-08-05 NOTE — Progress Notes (Signed)
Physical Therapy Treatment Patient Details Name: John Arps Sr. MRN: [id number] DOB: [birth year] Today's Date: 08/05/2018    History of Present Illness 67 yo admitted 10/25 with necrotic left toe s/p revascularization 10/30 and ray amputation, Transmetatarsal amputation left foot on 07/30/18, L BKA 08/02/18. PMhx: HTN, DM, arthritis    PT Comments    Pt progressing with ambulation distance but continues to be unsafe taking too large of hops moving center of gravity posterior and experiencing LOB with min/ mod A to correct. Pt can verbalize that he understands this but does not stop doing it. Also discussed proper positioning to maintain full knee extension and not sure he is understanding this concept fully. Tolerated there ex well with pain controlled. PT will continue to follow.    Follow Up Recommendations  CIR;Supervision/Assistance - 24 hour     Equipment Recommendations  Rolling walker with 5" wheels;Wheelchair (measurements PT)    Recommendations for Other Services Rehab consult     Precautions / Restrictions Precautions Precautions: Fall Precaution Comments: impulsive Restrictions Weight Bearing Restrictions: Yes LLE Weight Bearing: Non weight bearing Other Position/Activity Restrictions: L BKA    Mobility  Bed Mobility Overal bed mobility: Needs Assistance Bed Mobility: Supine to Sit     Supine to sit: Supervision     General bed mobility comments: supervision for safety  Transfers Overall transfer level: Needs assistance Equipment used: Rolling walker (2 wheeled) Transfers: Sit to/from Stand Sit to Stand: Min assist         General transfer comment: min A to steady and vc's to remain seated until lines and RW prepared  Ambulation/Gait Ambulation/Gait assistance: Min assist Gait Distance (Feet): 15 Feet Assistive device: Rolling walker (2 wheeled) Gait Pattern/deviations: (hopping) Gait velocity: decr Gait velocity interpretation: <1.31 ft/sec,  indicative of household ambulator General Gait Details: donned post op shoe R foot for protection and cushion. Pt continues to hop too far with RLE placing LE's under RW and epxeriencing a posterior LOB with min/ mod A to correct. Pt verbalizes that he understands why this is happening but  continues to hop too far   Chief Strategy Officer    Modified Rankin (Stroke Patients Only)       Balance Overall balance assessment: Needs assistance Sitting-balance support: No upper extremity supported;Feet supported Sitting balance-Leahy Scale: Fair   Postural control: Posterior lean Standing balance support: Bilateral upper extremity supported Standing balance-Leahy Scale: Poor Standing balance comment: reliant on B UEs and external support                            Cognition Arousal/Alertness: Awake/alert Behavior During Therapy: Impulsive Overall Cognitive Status: Impaired/Different from baseline Area of Impairment: Safety/judgement;Memory;Problem solving                     Memory: Decreased short-term memory   Safety/Judgement: Decreased awareness of safety;Decreased awareness of deficits   Problem Solving: Difficulty sequencing;Requires verbal cues General Comments: pt continues to be impulsive and does not show appropriate safety awareness. Also asked him to have family bring in R shoe and he agreed but after session when he was calling them he didn't remmeber what he was supposed to ask and was confused by the whole conversation      Exercises Amputee Exercises Quad Sets: 10 reps;Left;Supine Hip Flexion/Marching: AROM;Left;10 reps;Seated Knee Flexion: AROM;Left;10 reps;Supine Knee Extension: AROM;Left;10 reps;Seated Straight Leg  Raises: AROM;Left;10 reps;Supine    General Comments General comments (skin integrity, edema, etc.): discussed phantom pain and coing strategies      Pertinent Vitals/Pain Pain Assessment:  Faces Faces Pain Scale: Hurts little more Pain Location: Numbness left medial thigh Pain Descriptors / Indicators: Aching;Guarding Pain Intervention(s): Limited activity within patient's tolerance;Monitored during session    Home Living                      Prior Function            PT Goals (current goals can now be found in the care plan section) Acute Rehab PT Goals Patient Stated Goal: to return home Time For Goal Achievement: 08/12/18 Potential to Achieve Goals: Good Progress towards PT goals: Progressing toward goals    Frequency    Min 3X/week      PT Plan Current plan remains appropriate    Co-evaluation              AM-PAC PT "6 Clicks" Daily Activity  Outcome Measure  Difficulty turning over in bed (including adjusting bedclothes, sheets and blankets)?: None Difficulty moving from lying on back to sitting on the side of the bed? : None Difficulty sitting down on and standing up from a chair with arms (e.g., wheelchair, bedside commode, etc,.)?: A Little Help needed moving to and from a bed to chair (including a wheelchair)?: A Little Help needed walking in hospital room?: A Little Help needed climbing 3-5 steps with a railing? : A Lot 6 Click Score: 19    End of Session Equipment Utilized During Treatment: Gait belt Activity Tolerance: Patient tolerated treatment well Patient left: in chair;with call bell/phone within reach;with chair alarm set Nurse Communication: Mobility status PT Visit Diagnosis: Unsteadiness on feet (R26.81)     Time: 9458-5929 PT Time Calculation (min) (ACUTE ONLY): 11 min  Charges:  $Gait Training: 8-22 mins                     Leighton Roach, [place]  Pager [phone] Office [place] 08/05/2018, 12:05 PM

## 2018-08-05 NOTE — Care Management Note (Signed)
Case Management Note Marvetta Gibbons RN, BSN Transitions of Care Unit 4E- RN Case Manager [phone]  Patient Details  Name: [name] Sr. MRN: [id number] Date of Birth: 1951/07/13  Subjective/Objective:   Pt admitted with ischemic foot great toe gangrene-angiogram 07/25/2018 performed-left common iliac stent, left SFA popliteal angioplasty stent--was placed on antibiotics- ultimately s/p TMA per vascular              Action/Plan: PTA pt lived at home with wife, per PT/OT evals post TMA- recommendations for HHPT/OT- DME- RW and 3n1- CM spoke with pt at bedside regarding transition of care needs- per pt he does not have insurance or PCP. Discussed charity care with [facility]- pt is agreeable to having [facility] assess him for charity care eligibility for [facility] and DME needs. Referral has been called to Butch Penny with [facility] who will come speak with pt. Pt will need PCP in [place] clinic and HD- [facility]- call made for f/u appointment- however pt will need to call Clinic to speak with their Care Connects for screening before they will give pt an appointment info placed on AVS and will also share with pt. CM will follow for further transition of care needs and possible medication assistance.   Expected Discharge Date:  08/05/18               Expected Discharge Plan:  [place]  In-House Referral:  Clinical Social Work  Discharge planning [facility], Medication Assistance  Post Acute Care Choice:  Home Health, Durable Medical Equipment Choice offered to:  Patient  DME Arranged:  3-N-1, Walker rolling DME Agency:  [place]:    [place] Agency:  [place]  Status of Service:  Completed, signed off  If discussed at [place] of Stay Meetings, dates discussed:    Discharge Disposition: IP rehab   Additional Comments:  08/05/18- 1550- Marvetta Gibbons RN, CM-  Pt s/p BKA on 11/8- post  op consult placed to CIR, admissions coordinator has spoken to pt and wife today regarding IP rehab admission. Pt is stable for discharge and CIR has bed available today. Pt is agreeable to IP rehab admission and plan will be to transition to [facility] IP rehab later today. Pt has been provided info on the [facility] to f/u with post rehab. CM also notified Butch Penny with [facility] that pt would go to CIR.   08/01/18- [place] RN, CM- notified by Butch Penny with [facility] that pt has been approved for charity care with [facility] for [facility] and DME needs- will continue to follow. Orders for 3n1 and RW placed and HH orders pending.   [name], RN 08/05/2018, 3:53 PM

## 2018-08-05 NOTE — Discharge Summary (Signed)
Physician Discharge Summary  John Rammel Sr. MPN:[id number] DOB: [birth year] DOA: 07/19/2018  PCP: Patient, No Pcp Per  Admit date: 07/19/2018 Discharge date: 08/05/2018  Time spent: 25 minutes  Recommendations for Outpatient Follow-up:  1. Needs cbc 1 week, bmet as well 2. Needs CIR stay for further theapies  Discharge Diagnoses:  Principal Problem:   PAD (peripheral artery disease) ([place]) Active Problems:   Cellulitis in diabetic foot ([place])   Diabetes mellitus, type II ([place])   Cellulitis   Dry gangrene ([place])   Discharge Condition: good  Diet recommendation: dm hh  Filed Weights   08/03/18 0500 08/04/18 0419 08/05/18 0539  Weight: 70.7 kg 70.9 kg 63.4 kg    History of present illness:  67 year old male DM TY 2 Former smoker, daily drinker Prior history of accidental fall, seventh rib fracture 02/2008 Admitted [facility] 07/20/2018 3 months h/o first digit discoloration-possible PAD-accident to left midfoot and had some pain  Sent over to [facility] for evaluation by vascular surgery-placed on IV heparin-found to have critical limb ischemia left lower extremity with tissue loss monophasic dorsalis pedis-underwent arteriogram and VVS has performed toe amputation and transtibial amputation  Decompensated and had hypoxic respiratory failure 11/8 which was amenable to BiPAP-vascular surgery re-evalauted and patient underwent eventual BKA  Hospital Course:   Acute hypoxic respiratory failure-events noted 11/8 a.m.-concerning on x-ray for aspiration versus pulmonary edema-was transiently on BiPAP and is now much better No fever no cough-completed empiric duration abx --needs cxr 1 mo rule out persistnece esp in setting chr tob abuse  Severe PAD great toe gangrene-angiogram 07/25/2018 performed-left common iliac stent, left SFA popliteal angioplasty stent-- toe amputation and transmetatarsal amputation Vascular surgery of opinion foot is not healing-BKA  performed 11/9 No current pain-not using PRNS Changing to oral percocet with tylenol first choice  Bleeding from site-repeat labs in am-dressings re-inforced and stable per VVS  Diabetes mellitus type 2-A1c 7.4-controlled Continue metformin 500 twice daily  Probable hypervolemic hypo-natremia-stable this am  Hypokalemia-replace with K-Dur and resolved  Hypertension-no meds at home amlodipine 10 mg Coreg 6.25 twice daily and hydralazine 50 every 8-continue PRN labetalol every 8  prn  Hyperlipidemia continue statin  Chronic daily smoker patient has quit since the past 3 weeks  Chronic daily drinker stable at this time  Severe malnutrition BMI 21-outpatient management and monitoring   Discharge Exam: Vitals:   08/05/18 0040 08/05/18 0917  BP: (!) 150/74 132/72  Pulse: 82 83  Resp:  18  Temp: 98.8 F (37.1 C) 98.2 F (36.8 C)  SpO2: 95%     General: awake alert in nad Cardiovascular: s1 s 2no m/r/g Respiratory: clear no added sound abd soft WOudn dressed  Discharge Instructions   Discharge Instructions    Diet - low sodium heart healthy   Complete by:  As directed    Increase activity slowly   Complete by:  As directed      Allergies as of 08/05/2018      Reactions   Tea Itching, Rash      Medication List    TAKE these medications   acetaminophen 325 MG tablet Commonly known as:  TYLENOL Take 2 tablets (650 mg total) by mouth every 6 (six) hours as needed for mild pain, moderate pain or fever.   amLODipine 10 MG tablet Commonly known as:  NORVASC Take 1 tablet (10 mg total) by mouth daily. Start taking on:  08/06/2018   aspirin 81 MG EC tablet Take 1 tablet (81  mg total) by mouth daily. Start taking on:  08/06/2018   atorvastatin 20 MG tablet Commonly known as:  LIPITOR Take 1 tablet (20 mg total) by mouth daily at 6 PM.   carvedilol 6.25 MG tablet Commonly known as:  COREG Take 1 tablet (6.25 mg total) by mouth 2 (two) times daily  with a meal.   clopidogrel 75 MG tablet Commonly known as:  PLAVIX Take 1 tablet (75 mg total) by mouth daily with breakfast. Start taking on:  08/06/2018   hydrALAZINE 50 MG tablet Commonly known as:  APRESOLINE Take 1 tablet (50 mg total) by mouth every 8 (eight) hours.   HYDROcodone-acetaminophen 5-325 MG tablet Commonly known as:  NORCO/VICODIN Take 1 tablet by mouth every 6 (six) hours as needed for moderate pain.   metFORMIN 500 MG tablet Commonly known as:  GLUCOPHAGE Take 1 tablet (500 mg total) by mouth 2 (two) times daily with a meal.   nicotine 14 mg/24hr patch Commonly known as:  NICODERM CQ - dosed in mg/24 hours Place 1 patch (14 mg total) onto the skin daily. Start taking on:  08/06/2018            Durable Medical Equipment  (From admission, onward)         Start     Ordered   08/01/18 0849  For home use only DME 3 n 1  Once     08/01/18 0848   08/01/18 0749  For home use only DME Walker rolling  Once    Question:  Patient needs a walker to treat with the following condition  Answer:  Status post surgery   08/01/18 0748         Allergies  Allergen Reactions  . Tea Itching and Rash   Follow-up Information    [facility] Call.   Why:  You will need to call [phone] which is the clinic [facility] pre-screening # to be screened first for eligibility - they will then make you an appointment to be seen at the clinic- Please call this # ASAP to get screened and for f/u Contact information: [place] [phone]           The results of significant diagnostics from this hospitalization (including imaging, microbiology, ancillary and laboratory) are listed below for reference.    Significant Diagnostic Studies: Dg Chest 2 View  Result Date: 08/04/2018 CLINICAL DATA:  Pneumonia EXAM: CHEST - 2 VIEW COMPARISON:  Chest x-rays dated 08/03/2018 and 08/02/2018. FINDINGS: Persistent  opacity at the RIGHT lung base, pneumonia versus atelectasis. LEFT lung appears clear. Heart size and mediastinal contours are within normal limits. No acute or suspicious osseous finding. IMPRESSION: Persistent opacity at the RIGHT lung base, not significantly changed compared to yesterday's exam, compatible with atelectasis or pneumonia. Electronically Signed   By: Franki Cabot M.D.   On: 08/04/2018 21:43   Dg Chest 2 View  Result Date: 08/03/2018 CLINICAL DATA:  Heart murmur.  Follow-up abnormal chest radiography EXAM: CHEST - 2 VIEW COMPARISON:  03/01/2016.  08/02/2018. FINDINGS: Borderline cardiomegaly as seen previously. Atherosclerotic calcification of the thoracic aorta. Patchy bilateral pulmonary density right worse than left which could be due to edema or bronchopneumonia. Small bilateral pleural effusions with dependent atelectasis. I think the findings are slightly improved compared to yesterday, with exception that the effusions are slightly larger. IMPRESSION: Bilateral patchy pulmonary density right more than left, favored to represent edema over pneumonia.  Slight improvement since yesterday. Small effusions have enlarged. Electronically Signed   By: Nelson Chimes M.D.   On: 08/03/2018 13:07   Dg Chest 2 View  Result Date: 08/02/2018 CLINICAL DATA:  Possible pneumonia. Partial foot amputation 1 week ago. EXAM: CHEST - 2 VIEW COMPARISON:  08/02/2018 and 03/01/2016 FINDINGS: Lungs are adequately inflated with persistent hazy patchy opacification over the right lung with slight interval improvement likely due to infection. No evidence of effusion. Flattening of the hemidiaphragms on the lateral film. Cardiomediastinal silhouette and remainder the exam is unchanged. IMPRESSION: Hazy patchy opacification over the right lung unchanged to slightly improved likely due to infection. Electronically Signed   By: Marin Olp M.D.   On: 08/02/2018 10:29   Dg Tibia/fibula Left  Result Date:  07/20/2018 CLINICAL DATA:  LEFT foot discoloration.  History of diabetes. EXAM: LEFT FOOT - COMPLETE 3+ VIEW; LEFT TIBIA AND FIBULA - 2 VIEW COMPARISON:  None. FINDINGS: LEFT tibia and fibula: No destructive bony lesions. Mild degenerative changes the included knee with faint periarticular calcifications. Moderate vascular calcifications. No destructive bony lesions. Soft tissue planes are non suspicious. LEFT foot: No fracture deformity or dislocation. 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 10 mm geographic lucent lesion first metatarsal head. Severe vascular calcifications. Soft tissue planes are non suspicious. IMPRESSION: 1. No fracture deformity or dislocation. No radiographic findings of osteomyelitis. 2. Cystic lesion first metatarsal head may reflect gout or geode. 3. Severe vascular calcifications. Electronically Signed   By: Elon Alas M.D.   On: 07/20/2018 00:35   Dg Chest Port 1 View  Result Date: 08/02/2018 CLINICAL DATA:  Shortness of breath EXAM: PORTABLE CHEST 1 VIEW COMPARISON:  03/01/2016 FINDINGS: There is multifocal consolidation within the right lung, greatest in the parahilar region. There are nodular opacities at the right lung apex. No pleural effusion or pneumothorax. Normal cardiomediastinal contours. IMPRESSION: Multifocal opacities within the right lung, greatest in the parahilar region, with nodular opacities over the right upper lobe. This may indicate multifocal pneumonia. At a minimum, Followup PA and lateral chest X-ray is recommended in 3-4 weeks following trial of antibiotic therapy to ensure resolution and exclude underlying malignancy. Alternatively, chest CT could be performed for more definitive characterization. Electronically Signed   By: Ulyses Jarred M.D.   On: 08/02/2018 04:18   Dg Foot Complete Left  Result Date: 07/20/2018 CLINICAL DATA:  LEFT foot discoloration.  History  of diabetes. EXAM: LEFT FOOT - COMPLETE 3+ VIEW; LEFT TIBIA AND FIBULA - 2 VIEW COMPARISON:  None. FINDINGS: LEFT tibia and fibula: No destructive bony lesions. Mild degenerative changes the included knee with faint periarticular calcifications. Moderate vascular calcifications. No destructive bony lesions. Soft tissue planes are non suspicious. LEFT foot: No fracture deformity or dislocation. 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 10 mm geographic lucent lesion first metatarsal head. Severe vascular calcifications. Soft tissue planes are non suspicious. IMPRESSION: 1. No fracture deformity or dislocation. No radiographic findings of osteomyelitis. 2. Cystic lesion first metatarsal head may reflect gout or geode. 3. Severe vascular calcifications. Electronically Signed   By: Elon Alas M.D.   On: 07/20/2018 00:35   Vas [place] Burnard Bunting With/wo Tbi  Result Date: 07/21/2018 LOWER EXTREMITY DOPPLER STUDY Indications: Peripheral artery disease.  Performing Technologist: Birdena Crandall, [place] [facility]  Examination Guidelines: A complete evaluation includes at minimum, Doppler waveform signals and systolic blood pressure reading at the level  of bilateral brachial, anterior tibial, and posterior tibial arteries, when vessel segments are accessible. Bilateral testing is considered an integral part of a complete examination. Photoelectric Plethysmograph (PPG) waveforms and toe systolic pressure readings are included as required and additional duplex testing as needed. Limited examinations for reoccurring indications may be performed as noted.  ABI Findings: +---------+------------------+-----+---------+--------+ Right    Rt Pressure (mmHg)IndexWaveform Comment  +---------+------------------+-----+---------+--------+ Brachial 188                    triphasic         +---------+------------------+-----+---------+--------+ PTA      176                0.94 triphasic         +---------+------------------+-----+---------+--------+ DP       177               0.94 triphasic         +---------+------------------+-----+---------+--------+ Great Toe63                0.34                   +---------+------------------+-----+---------+--------+ +---------+-----------------+-----+---------------------------+----------------+ Left     Lt Pressure      IndexWaveform                   Comment                   (mmHg)                                                            +---------+-----------------+-----+---------------------------+----------------+ Brachial 160                   triphasic                                   +---------+-----------------+-----+---------------------------+----------------+ PTA      42               0.22 severely dampened mnophasic                 +---------+-----------------+-----+---------------------------+----------------+ DP       57               0.30 severely dampened                                                          monophasic                                  +---------+-----------------+-----+---------------------------+----------------+ Great Toe0                0.00                            absent waveforms +---------+-----------------+-----+---------------------------+----------------+ Calf     51               0.27 severely dampened  monophasic                                  +---------+-----------------+-----+---------------------------+----------------+ +-------+-----------+-----------+------------+------------+ ABI/TBIToday's ABIToday's TBIPrevious ABIPrevious TBI +-------+-----------+-----------+------------+------------+ Right  0.94       0.34                                +-------+-----------+-----------+------------+------------+ Left   0.30       0.00                                 +-------+-----------+-----------+------------+------------+ Technically difficult to evaluate Doppler waveforms on the left due to venous flow interference.  Summary: See technical note listed above Right: Resting right ankle-brachial index is within normal range. No evidence of significant right lower extremity arterial disease. The right toe-brachial index is abnormal. Left: Resting left ankle-brachial index indicates severe left lower extremity arterial disease. The left toe-brachial index is abnormal.  *See table(s) above for measurements and observations.  Electronically signed by Monica Martinez MD on 07/21/2018 at 4:00:50 PM.    Final     Microbiology: No results found for this or any previous visit (from the past 240 hour(s)).   Labs: Basic Metabolic Panel: Recent Labs  Lab 07/31/18 0312 08/01/18 0318 08/02/18 0348 08/03/18 0302 08/04/18 0317 08/05/18 0750  NA 132* 132* 129* 132* 132* 135  K 3.9 3.4* 3.2* 4.1 3.5 3.7  CL 99 97* 95* 100 101 103  CO2 25 27 25 26 25 27   GLUCOSE 220* 166* 172* 288* 165* 199*  BUN 8 6* 6* 15 11 5*  CREATININE 0.77 0.71 0.72 0.88 0.74 0.69  CALCIUM 8.4* 8.4* 8.2* 8.0* 7.7* 8.4*  MG 1.8  --   --   --   --   --   PHOS  --   --  3.3  --   --   --    Liver Function Tests: Recent Labs  Lab 08/02/18 0348 08/04/18 0317  AST  --  47*  ALT  --  28  ALKPHOS  --  53  BILITOT  --  0.8  PROT  --  5.8*  ALBUMIN 2.5* 2.1*   No results for input(s): LIPASE, AMYLASE in the last 168 hours. No results for input(s): AMMONIA in the last 168 hours. CBC: Recent Labs  Lab 08/01/18 0318 08/02/18 0925 08/03/18 0302 08/04/18 0317 08/05/18 0750  WBC 13.3* 11.7* 10.2 9.7 8.5  NEUTROABS 9.0* 9.6* 9.6* 6.2 5.8  HGB 11.1* 10.8* 8.9* 8.5* 10.0*  HCT 33.2* 32.0* 26.0* 25.6* 30.3*  MCV 88.3 88.2 88.1 88.0 87.8  PLT 296 299 312 372 498*   Cardiac Enzymes: Recent Labs  Lab 08/02/18 0356  TROPONINI <0.03   BNP: BNP (last 3  results) Recent Labs    08/02/18 0513  BNP 311.7*    ProBNP (last 3 results) No results for input(s): PROBNP in the last 8760 hours.  CBG: Recent Labs  Lab 08/04/18 1137 08/04/18 1608 08/04/18 2140 08/05/18 0608 08/05/18 1116  GLUCAP 174* 176* 190* 166* 236*       Signed:  Nita Sells MD   [facility] 08/05/2018, 1:37 PM

## 2018-08-05 NOTE — Progress Notes (Signed)
PMR Admission Coordinator Pre-Admission Assessment  Patient: John Krotz. is an 67 y.o., male MRN: [id number] DOB: October 18, 1950 Height: (charted wrong pt.) Weight: 63.4 kg                                                                                                                                                  Insurance Information HMO:     PPO:      PCP:      IPA:      80/20:      OTHER:  PRIMARY: Pt is uninsured (self pay)      Policy#:       Subscriber:  CM Name:       Phone#:      Fax#:  Pre-Cert#:       Employer:  Benefits:  Phone #:      Name:  Eff. Date:      Deduct:       Out of Pocket Max:       Life Max:  CIR: Pt is aware of estimated daily cost of care ($3,300); pt has been provided with financial counselor's name and number       SNF:  Outpatient:      Co-Pay:  Home Health:       Co-Pay:  DME:      Co-Pay:  Providers:  SECONDARY:       Policy#:       Subscriber:  CM Name:       Phone#:      Fax#:  Pre-Cert#:       Employer:  Benefits:  Phone #:      Name:  Eff. Date:      Deduct:       Out of Pocket Max:       Life Max:  CIR:       SNF:  Outpatient:      Co-Pay:  Home Health:       Co-Pay:  DME:      Co-Pay:   Medicaid Application Date:       Case Manager:  Disability Application Date:       Case Worker:   Emergency Contact Information         Contact Information    Name Relation Home Work Mobile   Kilbourne,Clay Daughter [phone]       Current Medical History  Patient Admitting Diagnosis: Peripheral arterial disease status post left below-knee amputation postoperative  History of Present Illness: [name] a 67 year old right-handed male history of diabetes mellitus as well as hypertension, tobacco abuse. Per chart review patient lives with spouse independent with assistive device prior to admission. One level homewith plan for ramped entrance.Wife works during the day. 3 children in the area.Presented 07/20/2018 with left foot pain  3 months with tip of the first digit left foot black  and discolored.x-rays and imaging revealed no fracture deformity. 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 Servando Snare. Hospital course pain managementweaned from PCA to PO Dilaudid.Stump sock per [employer] prosthetics.Subcutaneous Lovenox for DVT prophylaxis. Acute blood loss anemia 10.0 and monitored. Skin tear left elbow after attempting to get OOB without assist.Therapy evaluations completed with recommendations of physical medicine rehabilitation consult. Patient is to be admitted for a compressive rehabilitation program on 08/05/18.  Past Medical History      Past Medical History:  Diagnosis Date  . Arthritis   . Diabetes mellitus without complication ([place])   . Emesis   . Heart murmur   . Hyperglycemia   . Hypertension   . Joint pain     Family History  family history includes Cancer in his sister.  Prior Rehab/Hospitalizations:  Has the patient had major surgery during 100 days prior to admission? No  Current Medications   Current Facility-Administered Medications:  .  0.9 %  sodium chloride infusion, 250 mL, Intravenous, PRN, Ulyses Amor, PA-C, Last Rate: 10 mL/hr at 08/04/18 1811, 250 mL at 08/04/18 1811 .  acetaminophen (TYLENOL) tablet 650 mg, 650 mg, Oral, Q6H PRN, Ulyses Amor, PA-C, 650 mg at 08/02/18 0401 .  amLODipine (NORVASC) tablet 10 mg, 10 mg, Oral, Daily, Laurence Slate M, PA-C, 10 mg at 08/05/18 0910 .  aspirin EC tablet 81 mg, 81 mg, Oral, Daily, Laurence Slate M, PA-C, 81 mg at 08/05/18 0910 .  atorvastatin (LIPITOR) tablet 20 mg, 20 mg, Oral, q1800, Ulyses Amor, PA-C, 20 mg at 08/04/18  1643 .  carvedilol (COREG) tablet 6.25 mg, 6.25 mg, Oral, BID WC, Collins, Emma M, PA-C, 6.25 mg at 08/05/18 0910 .  clopidogrel (PLAVIX) tablet 75 mg, 75 mg, Oral, Q breakfast, Laurence Slate M, PA-C, 75 mg at 08/05/18 0910 .  enoxaparin (LOVENOX) injection 40 mg, 40 mg, Subcutaneous, Q24H, Collins, Emma M, PA-C, 40 mg at 08/05/18 1330 .  hydrALAZINE (APRESOLINE) tablet 50 mg, 50 mg, Oral, Q8H, Collins, Emma M, PA-C, 50 mg at 08/05/18 1329 .  HYDROmorphone (DILAUDID) tablet 2 mg, 2 mg, Oral, Q4H PRN, Verlon Au, Jai-Gurmukh, MD .  labetalol (NORMODYNE,TRANDATE) injection 10 mg, 10 mg, Intravenous, Q10 min PRN, Theda Sers, Emma M, PA-C .  lactated ringers bolus 500 mL, 500 mL, Intravenous, Once, Richton, Emma M, PA-C .  lactated ringers infusion, , Intravenous, Continuous, Ulyses Amor, PA-C, Last Rate: 50 mL/hr at 07/30/18 1241 .  lactated ringers infusion, , Intravenous, Continuous, Collins, Emma M, PA-C .  metFORMIN (GLUCOPHAGE) tablet 500 mg, 500 mg, Oral, BID WC, Collins, Emma M, PA-C, 500 mg at 08/05/18 0910 .  nicotine (NICODERM CQ - dosed in mg/24 hours) patch 14 mg, 14 mg, Transdermal, Daily, Laurence Slate M, PA-C, 14 mg at 08/05/18 0913 .  ondansetron (ZOFRAN) injection 4 mg, 4 mg, Intravenous, Q6H PRN, Theda Sers, Emma M, PA-C .  potassium chloride SA (K-DUR,KLOR-CON) CR tablet 40 mEq, 40 mEq, Oral, Daily, Laurence Slate M, PA-C, 40 mEq at 08/05/18 2500  Patients Current Diet:     Diet Order                  Diet - low sodium heart healthy         Diet Heart Room  service appropriate? 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 cane  Prior Functional Level Prior Function Level of Independence: Independent with assistive device(s) Comments: tub only no shower so he does sponge baths  Self Care: Did the patient need help bathing, dressing, using the toilet or eating?  Independent  Indoor Mobility: Did the patient need assistance with walking from room to room (with or without device)? Independent  Stairs: Did the patient need assistance with internal or external stairs (with or without device)? Independent  Functional Cognition: Did the patient need help planning regular tasks such as shopping or remembering to take medications? Independent  Current Functional Level Cognition  Overall Cognitive Status: Impaired/Different from baseline Orientation Level: Oriented X4 Safety/Judgement: Decreased awareness of safety, Decreased awareness of deficits General Comments: pt continues to be impulsive and does not show appropriate safety awareness. Also asked him to have family bring in R shoe and he agreed but after session when he was calling them he didn't remmeber what he was supposed to ask and was confused by the whole conversation    Extremity Assessment (includes Sensation/Coordination)  Upper Extremity Assessment: Overall WFL for tasks assessed  Lower Extremity Assessment: Defer to PT evaluation(s/p L BKA)    ADLs  Overall ADL's : Needs assistance/impaired Eating/Feeding: Set up, Sitting Grooming: Set up, Sitting Grooming Details (indicate cue type and reason): minimal A for standing balance at sink Upper  Body Bathing: Set up, Sitting Lower Body Bathing: Moderate assistance, Sitting/lateral leans Upper Body Dressing : Set up, Sitting Lower Body Dressing: Moderate assistance, Sitting/lateral leans Lower Body Dressing Details (indicate cue type and reason): can don Darco shoe and manage pants after toileting with min guard assist for standing balance Toilet Transfer: Minimal assistance, Ambulation, RW, BSC Toilet Transfer Details (indicate cue type and reason): 3n1 over toilet. simulated with walking to/from bathroom and recliner transfer. Min A for LOB on turns. Pt reporting increased pain in L heel during ambulation Toileting- Clothing Manipulation and Hygiene: Moderate assistance, Sitting/lateral lean Tub/ Shower Transfer: Minimal assistance Functional mobility during ADLs: Minimal assistance, Rolling walker, Cueing for sequencing, Cueing for safety General ADL Comments: pt instructed in how to manage phantom pain and in compensatory strategies for LB ADL avoiding standing    Mobility  Overal bed mobility: Needs Assistance Bed Mobility: Supine to Sit Supine to sit: Supervision General bed mobility comments: supervision for safety    Transfers  Overall transfer level: Needs assistance Equipment used: Rolling walker (2 wheeled) Transfers: Sit to/from Stand Sit to Stand: Min assist General transfer comment: min A to steady and vc's to remain seated until lines and RW prepared    Ambulation / Gait / Stairs / Wheelchair Mobility  Ambulation/Gait Ambulation/Gait assistance: Herbalist (Feet): 15 Feet Assistive device: Rolling walker (2 wheeled) Gait Pattern/deviations: (  hopping) General Gait Details: donned post op shoe R foot for protection and cushion. Pt continues to hop too far with RLE placing LE's under RW and epxeriencing a posterior LOB with min/ mod A to correct. Pt verbalizes that he understands why this is happening but  continues to hop too far Gait velocity:  decr Gait velocity interpretation: <1.31 ft/sec, indicative of household ambulator    Posture / Balance Balance Overall balance assessment: Needs assistance Sitting-balance support: No upper extremity supported, Feet supported Sitting balance-Leahy Scale: Fair Postural control: Posterior lean Standing balance support: Bilateral upper extremity supported Standing balance-Leahy Scale: Poor Standing balance comment: reliant on B UEs and external support    Special needs/care consideration BiPAP/CPAP: No  CPM: no Continuous Drip IV: 0.9% sodium chloride infusion  Dialysis: no        Days: NA Life Vest: No Oxygen: no Special Bed: no Trach Size:no Wound Vac (area): no     Location: no Skin: surgical incision on LLE, skin tear Left elbow                           Bowel mgmt: continent, last BM: 08/04/18 Bladder mgmt: continent, use of urinal Diabetic mgmt: yes. Pt unaware of diabetes PTA     Previous Home Environment Living Arrangements: Spouse/significant other Available Help at Discharge: Available PRN/intermittently, Family(wife works) Type of Home: House Home Layout: One level Home Access: Other (comment)(land lord has agreed to build a ramp) Entrance Stairs-Rails: Can reach both Entrance Stairs-Number of Steps: 5 Bathroom Shower/Tub: Tub only Biochemist, clinical: Standard Bathroom Accessibility: Yes How Accessible: Accessible via walker Home Care Services: No  Discharge Living Setting Plans for Discharge Living Setting: Patient's home, Lives with (comment)(lives with wife; daughter and wife will split the day) Type of Home at Discharge: House Discharge Home Layout: Two level, Able to live on main level with bedroom/bathroom Alternate Level Stairs-Rails: Right, Left(unknown; NA) Alternate Level Stairs-Number of Steps: full flight Discharge Home Access: Stairs to enter Entrance Stairs-Rails: Can reach both Entrance Stairs-Number of Steps: 5 steps to enter Discharge  Bathroom Shower/Tub: Tub/shower unit Discharge Bathroom Toilet: Handicapped height Discharge Bathroom Accessibility: Yes How Accessible: Accessible via wheelchair Does the patient have any problems obtaining your medications?: No  Social/Family/Support Systems Patient Roles: Spouse, Parent(has grown daughter) Contact Information: wife: [name]): [phone]; daughter [name]): [phone] Anticipated Caregiver: wife and daugther to cover most of day Anticipated Caregiver's Contact Information: see above Ability/Limitations of Caregiver: Min A Caregiver Availability: Other (Comment)(close to 24/7 Coverage) Discharge Plan Discussed with Primary Caregiver: Yes(with wife and patient) Is Caregiver In Agreement with Plan?: Yes Does Caregiver/Family have Issues with Lodging/Transportation while Pt is in Rehab?: No   Goals/Additional Needs Patient/Family Goal for Rehab: PT/OT: Modified Independent; SLP: NA Expected length of stay: 7-10 dyas Cultural Considerations: NA Dietary Needs: Heart Healthy, thin liquids Equipment Needs: TBD Pt/Family Agrees to Admission and willing to participate: Yes Program Orientation Provided & Reviewed with Pt/Caregiver Including Roles  & Responsibilities: Yes(reviewed with wife and pt)  Barriers to Discharge: Inaccessible home environment, Home environment access/layout, New diabetic, Insurance for SNF coverage, Weight bearing restrictions  Barriers to Discharge Comments: steps to enter home; unknown diabetic   Decrease burden of Care through IP rehab admission:   Possible need for SNF placement upon discharge: Not anticipated. Pt has good social support at home and has good prognosis for further progress.    Patient Condition: This patient's condition remains as documented in the  consult dated 08/04/18, in which the Rehabilitation Physician determined and documented that the patient's condition is appropriate for intensive rehabilitative care in an  inpatient rehabilitation facility. Will admit to inpatient rehab today.  Preadmission Screen Completed By:  Jhonnie Garner, 08/05/2018 3:52 PM ______________________________________________________________________   Discussed status with Dr. Posey Pronto on 08/05/18 at 4:04PM and received telephone approval for admission today.  Admission Coordinator:  Jhonnie Garner, time 4:04PM/Date 08/05/18.           Cosigned by: Jamse Arn, MD at 08/05/2018 4:23 PM  Revision History

## 2018-08-05 NOTE — Progress Notes (Signed)
Physical Medicine and Rehabilitation Consult Reason for Consult: Decreased functional mobility and ADLs after below-knee amputation left side postoperative day #2 Referring Phsyician: Dr. Leo Grosser Sr. is an 67 y.o. male.   HPI: 67 year old male with type 2 diabetes who had a 52-month history of left foot pain.  The first digit on the left foot became black and discolored.  He presented through the emergency department at [facility] on 07/20/2018.  X-rays showed no evidence of osteomyelitis.  Patient was transferred to [facility] for gangrene of the left great toe and cellulitis.  The patient was evaluated by vascular surgery on 07/20/2018.  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 Carlis Abbott. PT evaluation 08/03/2018, min assist ambulation with a rolling walker OT evaluation 08/03/2018, mod assist lower body ADLs set up for upper body ADLs, mod assist toileting Review of Systems -Review of Systems  Constitutional: Negative.   HENT: Negative for ear pain, hearing loss, nosebleeds and tinnitus.   Eyes: Negative for blurred vision, double vision and redness.  Respiratory: Negative for cough, sputum production, shortness of breath and wheezing.   Cardiovascular: Negative for chest pain and palpitations.  Gastrointestinal: Positive for constipation. Negative for heartburn and nausea.  Genitourinary: Negative for dysuria and urgency.  Musculoskeletal: Positive for joint pain.  Skin: Negative.   Neurological: Negative for dizziness, loss of consciousness and  headaches.  Psychiatric/Behavioral: Negative for depression and memory loss.        Past Medical History:  Diagnosis Date  . Arthritis   . Diabetes mellitus without complication ([place])   . Emesis   . Heart murmur   . Hyperglycemia   . Hypertension   . Joint pain         Past Surgical History:  Procedure Laterality Date  . AMPUTATION Left 08/02/2018   Procedure: LEFT AMPUTATION BELOW KNEE;  Surgeon: Marty Heck, MD;  Location: [place];  Service: Vascular;  Laterality: Left;  . ELBOW SURGERY     Bone graft to repair elbow  . LOWER EXTREMITY ANGIOGRAPHY N/A 07/24/2018   Procedure: LOWER EXTREMITY ANGIOGRAPHY;  Surgeon: Marty Heck, MD;  Location: [place] [facility];  Service: Cardiovascular;  Laterality: N/A;  . PERIPHERAL VASCULAR INTERVENTION  07/24/2018   Procedure: PERIPHERAL VASCULAR INTERVENTION;  Surgeon: Marty Heck, MD;  Location: [place] [facility];  Service: Cardiovascular;;  . TONSILLECTOMY    . TRANSMETATARSAL AMPUTATION Left 07/25/2018   Procedure: LEFT GREAT TOE AMPUTATION;  Surgeon: Marty Heck, MD;  Location: [place];  Service: Vascular;  Laterality: Left;  . TRANSMETATARSAL AMPUTATION Left 07/30/2018   Procedure: TRANSMETATARSAL AMPUTATION;  Surgeon: Waynetta Sandy, MD;  Location: [facility] OR;  Service: Vascular;  Laterality: Left;        Family History  Problem Relation Age of Onset  . Cancer Sister    Social History:  reports that he has been smoking cigarettes. He has been smoking about 0.50 packs per day. He quit smokeless tobacco use about 29 years ago.  His smokeless tobacco use included snuff. He reports that he drinks about 12.0 standard drinks of alcohol per week. He reports  that he does not use drugs. Allergies:      Allergies  Allergen Reactions  . Tea Itching and Rash   No medications prior to admission.    Home: Home Living Family/patient expects to be discharged to:: Private  residence Living Arrangements: Spouse/significant other Available Help at Discharge: Available PRN/intermittently, Family(wife works) Type of Home: House Home Access: Other (comment)(land lord has agreed to build a ramp) Technical brewer of Steps: 5 Entrance Stairs-Rails: Can reach both Home Layout: One level Bathroom Shower/Tub: Tub only Biochemist, clinical: Standard Bathroom Accessibility: Yes Home Equipment: Cane - single point  Functional History: Prior Function Comments: tub only no shower so he does sponge baths Functional Status:  Mobility: Ambulation/Gait Gait Distance (Feet): 10 Feet Gait velocity: decr General Gait Details: Min assist for pivotal hops from bed to chair. Provided cues for walker proximity (pt tending to hop past frame of walker).   ADL: ADL Grooming Details (indicate cue type and reason): minimal A for standing balance at sink Lower Body Dressing Details (indicate cue type and reason): can don Darco shoe and manage pants after toileting with min guard assist for standing balance Toilet Transfer Details (indicate cue type and reason): 3n1 over toilet. simulated with walking to/from bathroom and recliner transfer. Min A for LOB on turns. Pt reporting increased pain in L heel during ambulation  Cognition: Cognition Overall Cognitive Status: Impaired/Different from baseline Orientation Level: Oriented X4 Cognition Arousal/Alertness: Awake/alert Behavior During Therapy: Impulsive Overall Cognitive Status: Impaired/Different from baseline Area of Impairment: Safety/judgement, Memory, Problem solving Memory: Decreased short-term memory Safety/Judgement: Decreased awareness of safety, Decreased awareness of deficits Problem Solving: Difficulty sequencing, Requires verbal cues  Blood pressure (!) 157/78, pulse 85, temperature 98.3 F (36.8 C), temperature source Oral, resp. rate 16, height 5' 10.98" (1.803 m), weight 70.9 kg, SpO2 99 %. Physical Exam    Nursing note and vitals reviewed. Constitutional: He is oriented to person, place, and time. He appears well-developed and well-nourished.  HENT:  Head: Normocephalic and atraumatic.  Eyes: Pupils are equal, round, and reactive to light. Conjunctivae and EOM are normal.  Neck: Normal range of motion.  Cardiovascular: Normal rate, regular rhythm and normal heart sounds.  No murmur heard. Respiratory: Effort normal and breath sounds normal. No respiratory distress.  GI: Soft. Bowel sounds are normal. He exhibits no distension. There is no tenderness.  Neurological: He is alert and oriented to person, place, and time.  Motor strength 5/5 bilateral deltoid bicep tricep grip as well as right hip flexor knee extensor ankle dorsiflexor 4/5 left hip flexion 3- left knee flexion and extension Sensation intact light touch bilateral upper limbs and right lower limb  Skin: Skin is warm and dry.  Psychiatric: He has a normal mood and affect.  Left BKA with bulky dressing and Ace wrap       Results for orders placed or performed during the hospital encounter of 07/19/18 (from the past 24 hour(s))  Glucose, capillary     Status: Abnormal   Collection Time: 08/03/18  4:20 PM  Result Value Ref Range   Glucose-Capillary 214 (H) 70 - 99 mg/dL  Glucose, capillary     Status: Abnormal   Collection Time: 08/03/18  8:44 PM  Result Value Ref Range   Glucose-Capillary 136 (H) 70 - 99 mg/dL  CBC with Differential/Platelet     Status: Abnormal   Collection Time: 08/04/18  3:17 AM  Result Value Ref Range   WBC 9.7 4.0 - 10.5 K/uL   RBC  2.91 (L) 4.22 - 5.81 MIL/uL   Hemoglobin 8.5 (L) 13.0 - 17.0 g/dL   HCT 25.6 (L) 39.0 - 52.0 %   MCV 88.0 80.0 - 100.0 fL   MCH 29.2 26.0 - 34.0 pg   MCHC 33.2 30.0 - 36.0 g/dL   RDW 12.9 11.5 - 15.5 %   Platelets 372 150 - 400 K/uL   nRBC 0.0 0.0 - 0.2 %   Neutrophils Relative % 63 %   Neutro Abs 6.2 1.7 - 7.7 K/uL   Lymphocytes Relative 23 %    Lymphs Abs 2.3 0.7 - 4.0 K/uL   Monocytes Relative 11 %   Monocytes Absolute 1.1 (H) 0.1 - 1.0 K/uL   Eosinophils Relative 1 %   Eosinophils Absolute 0.1 0.0 - 0.5 K/uL   Basophils Relative 1 %   Basophils Absolute 0.1 0.0 - 0.1 K/uL   Immature Granulocytes 1 %   Abs Immature Granulocytes 0.06 0.00 - 0.07 K/uL   Burr Cells PRESENT   Comprehensive metabolic panel     Status: Abnormal   Collection Time: 08/04/18  3:17 AM  Result Value Ref Range   Sodium 132 (L) 135 - 145 mmol/L   Potassium 3.5 3.5 - 5.1 mmol/L   Chloride 101 98 - 111 mmol/L   CO2 25 22 - 32 mmol/L   Glucose, Bld 165 (H) 70 - 99 mg/dL   BUN 11 8 - 23 mg/dL   Creatinine, Ser 0.74 0.61 - 1.24 mg/dL   Calcium 7.7 (L) 8.9 - 10.3 mg/dL   Total Protein 5.8 (L) 6.5 - 8.1 g/dL   Albumin 2.1 (L) 3.5 - 5.0 g/dL   AST 47 (H) 15 - 41 U/L   ALT 28 0 - 44 U/L   Alkaline Phosphatase 53 38 - 126 U/L   Total Bilirubin 0.8 0.3 - 1.2 mg/dL   GFR calc non Af Amer >60 >60 mL/min   GFR calc Af Amer >60 >60 mL/min   Anion gap 6 5 - 15   Dg Chest 2 View  Result Date: 08/03/2018 CLINICAL DATA:  Heart murmur.  Follow-up abnormal chest radiography EXAM: CHEST - 2 VIEW COMPARISON:  03/01/2016.  08/02/2018. FINDINGS: Borderline cardiomegaly as seen previously. Atherosclerotic calcification of the thoracic aorta. Patchy bilateral pulmonary density right worse than left which could be due to edema or bronchopneumonia. Small bilateral pleural effusions with dependent atelectasis. I think the findings are slightly improved compared to yesterday, with exception that the effusions are slightly larger. IMPRESSION: Bilateral patchy pulmonary density right more than left, favored to represent edema over pneumonia. Slight improvement since yesterday. Small effusions have enlarged. Electronically Signed   By: Nelson Chimes M.D.   On: 08/03/2018 13:07    Assessment/Plan: Diagnosis: Peripheral arterial disease status post left  below-knee amputation postoperative day #2 1. Does the need for close, 24 hr/day medical supervision in concert with the patient's rehab needs make it unreasonable for this patient to be served in a less intensive setting? Yes 2. Co-Morbidities requiring supervision/potential complications: Diabetes, hypertension 3. Due to bladder management, bowel management, safety, skin/wound care, disease management, medication administration, pain management and patient education, does the patient require 24 hr/day rehab nursing? Yes 4. Does the patient require coordinated care of a physician, rehab nurse, PT (1-2 hrs/day, 5 days/week) and OT (1-2 hrs/day, 5 days/week) to address physical and functional deficits in the context of the above medical diagnosis(es)? Yes Addressing deficits in the following areas: balance, endurance, locomotion, strength, transferring, bowel/bladder control,  bathing, dressing, feeding, grooming, toileting, cognition and psychosocial support 5. Can the patient actively participate in an intensive therapy program of at least 3 hrs of therapy per day at least 5 days per week? Yes 6. The potential for patient to make measurable gains while on inpatient rehab is good 7. Anticipated functional outcomes upon discharge from inpatients are mod I PT, mod I OT, NASLP 8. Estimated rehab length of stay to reach the above functional goals is: 7-10d 9. Does the patient have adequate social supports to accommodate these discharge functional goals? Yes 10. Anticipated D/C setting: Home 11. Anticipated post D/C treatments: [place] therapy 12. Overall Rehab/Functional Prognosis: excellent  RECOMMENDATIONS: This patient's condition is appropriate for continued rehabilitative care in the following setting: CIR Patient has agreed to participate in recommended program. Yes Note that insurance prior authorization may be required for reimbursement for recommended care.  Comment: Needs to be off of PCA and  tolerating therapy using oral pain medications prior to rehab  Charlett Blake 08/04/2018         Routing History

## 2018-08-05 NOTE — Progress Notes (Signed)
Patient transported via wheelchair in stable condition with all belongings to 4W room 24 for rehab. He denies concerns and family present.

## 2018-08-05 NOTE — Progress Notes (Signed)
Orthopedic Tech Progress Note Patient Details:  John Krall Sr. [birth year] [id number]  Patient ID: John Laurence Sr., male   DOB: December 25, 1950, 67 y.o.   MRN: [id number]   [name] 08/05/2018, 11:00 AMCalled [employer] for left BKA.

## 2018-08-05 NOTE — Progress Notes (Addendum)
[facility] [place]  Subjective  - Comfortable, no new complaints.   Objective (!) 150/74 82 98.8 F (37.1 C) (Oral) (!) 22 95%  Intake/Output Summary (Last 24 hours) at 08/05/2018 0712 Last data filed at 08/05/2018 0210 Gross per 24 hour  Intake 1220.79 ml  Output 1240 ml  Net -19.21 ml    Left BKA dressing clean and dry Palpable left popliteal Heart RRR Lungs non labored breathing  Assessment/Planning: POD # 3 Left BKA  Pending CIR will wean off PCA today Will order [employer] sock   Roxy Horseman 08/05/2018 7:12 AM --  Laboratory Lab Results: Recent Labs    08/03/18 0302 08/04/18 0317  WBC 10.2 9.7  HGB 8.9* 8.5*  HCT 26.0* 25.6*  PLT 312 372   BMET Recent Labs    08/03/18 0302 08/04/18 0317  NA 132* 132*  K 4.1 3.5  CL 100 101  CO2 26 25  GLUCOSE 288* 165*  BUN 15 11  CREATININE 0.88 0.74  CALCIUM 8.0* 7.7*    COAG Lab Results  Component Value Date   INR 1.00 07/31/2018   INR 0.87 07/20/2018   No results found for: PTT   I have seen and evaluated the patient. I agree with the PA note as documented above. L BKA looks good.  Small amount of oozing from medial stable line, no hematoma.  Tissue health.  Agree with PT, rehab etc.  [facility] will stay for  3 weeks.  Marty Heck, MD [facility] [place] Office: [phone] Pager: [phone]

## 2018-08-05 NOTE — Progress Notes (Signed)
Received pt. As a new admission.

## 2018-08-05 NOTE — Progress Notes (Signed)
Inpatient Rehabilitation-Admissions Coordinator    Met with patient at the bedside to discuss team's recommendation for inpatient rehabilitation. Shared booklets, expectations while in CIR, expected length of stay, and anticipated functional level at DC. With pt approval, AC contacted pt's wife, [name], to also discuss CIR program and DC support at home. Per wife, pt has medical insurance under [facility]. AC had [employer] fast streamer [name] benefits; It appears pt's insurance terminated on 04/24/17; thus pt is currently uninsured. AC relayed this information to the patient who stated that he has no other insurance that he knows of if that one does not work.   Pt agreed to pursue CIR under self pay. AC shared estimated daily cost of care with pt and provided him with letter regarding amount as well as contact number for his specific financial counselor. Pt verbalized understand and read letter while [facility] present.   [facility] received medical approval as well as pt approval for admit to CIR today.   Please call if questions.   [name], OTR/L  Rehab Admissions Coordinator  [phone] 08/05/2018 4:11 PM

## 2018-08-05 NOTE — H&P (Signed)
Physical Medicine and Rehabilitation Admission H&P    Chief Complaint  Patient presents with  . Leg Pain  : HPI: [name] a 67 year old right-handed male history of diabetes mellitus as well as hypertension, tobacco abuse. Per chart review and patient, patient lives with spouse independent with assistive device prior to admission. One level home with plan for ramped entrance.Wife works during the day. 3 children in the area. Presented 07/20/2018 with left foot pain 3 months with tip of the first digit left foot black and discolored. X-rays and imaging revealed no fracture deformity. Cystic lesion first metatarsal head. 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 Servando Snare. Patient had continues nonhealing and underwent transtibial amputation on 11/9. Hospital course pain management weaned from PCA to PO Dilaudid. Stump sock per [employer] prosthetics. Subcutaneous Lovenox for DVT prophylaxis. Acute blood loss anemia 10.0 and monitored. Skin tear left elbow after attempting to get OOB without assist.Therapy evaluations completed with recommendations of physical medicine rehabilitation consult. Patient was admitted for a compressive rehabilitation program.  Review of Systems  Constitutional: Negative for fever.  HENT: Negative for hearing loss.   Eyes: Negative for blurred vision and double vision.  Respiratory: Positive for shortness of breath. Negative for cough.   Cardiovascular: Positive for leg swelling. Negative for chest pain and palpitations.  Gastrointestinal: Positive for nausea.  Genitourinary: Positive for urgency. Negative for dysuria, flank pain and hematuria.  Musculoskeletal: Positive for joint pain and  myalgias.  All other systems reviewed and are negative.  Past Medical History:  Diagnosis Date  . Arthritis   . Diabetes mellitus without complication ([place])   . Emesis   . Heart murmur   . Hyperglycemia   . Hypertension   . Joint pain    Past Surgical History:  Procedure Laterality Date  . AMPUTATION Left 08/02/2018   Procedure: LEFT AMPUTATION BELOW KNEE;  Surgeon: Marty Heck, MD;  Location: [facility];  Service: Vascular;  Laterality: Left;  . ELBOW SURGERY     Bone graft to repair elbow  . LOWER EXTREMITY ANGIOGRAPHY N/A 07/24/2018   Procedure: LOWER EXTREMITY ANGIOGRAPHY;  Surgeon: Marty Heck, MD;  Location: [facility];  Service: Cardiovascular;  Laterality: N/A;  . PERIPHERAL VASCULAR INTERVENTION  07/24/2018   Procedure: PERIPHERAL VASCULAR INTERVENTION;  Surgeon: Marty Heck, MD;  Location: [facility];  Service: Cardiovascular;;  . TONSILLECTOMY    . TRANSMETATARSAL AMPUTATION Left 07/25/2018   Procedure: LEFT GREAT TOE AMPUTATION;  Surgeon: Marty Heck, MD;  Location: [facility];  Service: Vascular;  Laterality: Left;  . TRANSMETATARSAL AMPUTATION Left 07/30/2018   Procedure: TRANSMETATARSAL AMPUTATION;  Surgeon: Waynetta Sandy, MD;  Location: [facility];  Service: Vascular;  Laterality: Left;   Family History  Problem Relation Age of Onset  . Cancer Sister    Social History:  reports that he has been smoking cigarettes. He has been smoking about 0.50 packs per day. He quit smokeless tobacco use about 29 years ago.  His smokeless tobacco use included snuff. He reports that he drinks about 12.0 standard drinks of alcohol per week. He reports that he does not use drugs. Allergies:  Allergies  Allergen Reactions  . Tea Itching and Rash   No medications prior to admission.  Drug Regimen Review Drug regimen was reviewed and remains appropriate with no significant issues identified  Home: Home Living Family/patient  expects to be discharged to:: Private residence Living Arrangements: Spouse/significant other Available Help at Discharge: Available PRN/intermittently, Family(wife works) Type of Home: House Home Access: Other (comment)(land lord has agreed to build a ramp) Technical brewer of Steps: 5 Entrance Stairs-Rails: Can reach both Home Layout: One level Bathroom Shower/Tub: Tub only Biochemist, clinical: Standard Bathroom Accessibility: Yes Home Equipment: Cane - single point   Functional History: Prior Function Level of Independence: Independent with assistive device(s) Comments: tub only no shower so he does sponge baths  Functional Status:  Mobility: Bed Mobility Overal bed mobility: Needs Assistance Bed Mobility: Supine to Sit Supine to sit: Supervision General bed mobility comments: supervision for safety Transfers Overall transfer level: Needs assistance Equipment used: Rolling walker (2 wheeled) Transfers: Sit to/from Stand Sit to Stand: Min assist General transfer comment: min A to steady and vc's to remain seated until lines and RW prepared Ambulation/Gait Ambulation/Gait assistance: Min Web designer (Feet): 15 Feet Assistive device: Rolling walker (2 wheeled) Gait Pattern/deviations: (hopping) General Gait Details: donned post op shoe R foot for protection and cushion. Pt continues to hop too far with RLE placing LE's under RW and epxeriencing a posterior LOB with min/ mod A to correct. Pt verbalizes that he understands why this is happening but  continues to hop too far Gait velocity: decr Gait velocity interpretation: <1.31 ft/sec, indicative of household ambulator    ADL: ADL Overall ADL's : Needs assistance/impaired Eating/Feeding: Set up, Sitting Grooming: Set up, Sitting Grooming Details (indicate cue type and reason): minimal A for standing balance at sink Upper Body Bathing: Set up, Sitting Lower Body Bathing: Moderate assistance, Sitting/lateral  leans Upper Body Dressing : Set up, Sitting Lower Body Dressing: Moderate assistance, Sitting/lateral leans Lower Body Dressing Details (indicate cue type and reason): can don [place]co shoe and manage pants after toileting with min guard assist for standing balance Toilet Transfer: Minimal assistance, Ambulation, RW, BSC Toilet Transfer Details (indicate cue type and reason): 3n1 over toilet. simulated with walking to/from bathroom and recliner transfer. Min A for LOB on turns. Pt reporting increased pain in L heel during ambulation Toileting- Clothing Manipulation and Hygiene: Moderate assistance, Sitting/lateral lean Tub/ Shower Transfer: Minimal assistance Functional mobility during ADLs: Minimal assistance, Rolling walker, Cueing for sequencing, Cueing for safety General ADL Comments: pt instructed in how to manage phantom pain and in compensatory strategies for LB ADL avoiding standing  Cognition: Cognition Overall Cognitive Status: Impaired/Different from baseline Orientation Level: Oriented X4 Cognition Arousal/Alertness: Awake/alert Behavior During Therapy: Impulsive Overall Cognitive Status: Impaired/Different from baseline Area of Impairment: Safety/judgement, Memory, Problem solving Memory: Decreased short-term memory Safety/Judgement: Decreased awareness of safety, Decreased awareness of deficits Problem Solving: Difficulty sequencing, Requires verbal cues General Comments: pt continues to be impulsive and does not show appropriate safety awareness. Also asked him to have family bring in R shoe and he agreed but after session when he was calling them he didn't remmeber what he was supposed to ask and was confused by the whole conversation  Physical Exam: Blood pressure 132/72, pulse 83, temperature 98.2 F (36.8 C), temperature source Oral, resp. rate 18, height 5' 10.98" (1.803 m), weight 63.4 kg, SpO2 95 %. Physical Exam  Vitals reviewed. Constitutional: He is oriented to  person, place, and time. He appears well-developed and well-nourished.  HENT:  Head: Normocephalic and atraumatic.  Poor dentition  Eyes: EOM are normal.  Right eye exhibits no discharge. Left eye exhibits no discharge.  Neck: Normal range of motion. Neck supple. No thyromegaly present.  Cardiovascular: Normal rate, regular rhythm and normal heart sounds.  Respiratory: Effort normal and breath sounds normal.  GI: Soft. Bowel sounds are normal. He exhibits no distension.  Musculoskeletal:  Left stump edema and tenderness  Neurological: He is alert and oriented to person, place, and time.  Motor: B/l UE 5/5 proximal to distal RLE: 4+-5/5 proximal to distal LLE: HF, KE 4+/5  Skin: Skin is warm and dry.  Amputation site is dressed. Appropriately tender.  Skin tear to left elbow with dressing intact  Psychiatric: He has a normal mood and affect. His behavior is normal.    Results for orders placed or performed during the hospital encounter of 07/19/18 (from the past 48 hour(s))  Glucose, capillary     Status: Abnormal   Collection Time: 08/03/18  4:20 PM  Result Value Ref Range   Glucose-Capillary 214 (H) 70 - 99 mg/dL  Glucose, capillary     Status: Abnormal   Collection Time: 08/03/18  8:44 PM  Result Value Ref Range   Glucose-Capillary 136 (H) 70 - 99 mg/dL  CBC with Differential/Platelet     Status: Abnormal   Collection Time: 08/04/18  3:17 AM  Result Value Ref Range   WBC 9.7 4.0 - 10.5 K/uL   RBC 2.91 (L) 4.22 - 5.81 MIL/uL   Hemoglobin 8.5 (L) 13.0 - 17.0 g/dL   HCT 25.6 (L) 39.0 - 52.0 %   MCV 88.0 80.0 - 100.0 fL   MCH 29.2 26.0 - 34.0 pg   MCHC 33.2 30.0 - 36.0 g/dL   RDW 12.9 11.5 - 15.5 %   Platelets 372 150 - 400 K/uL   nRBC 0.0 0.0 - 0.2 %   Neutrophils Relative % 63 %   Neutro Abs 6.2 1.7 - 7.7 K/uL   Lymphocytes Relative 23 %   Lymphs Abs 2.3 0.7 - 4.0 K/uL   Monocytes Relative 11 %   Monocytes Absolute 1.1 (H) 0.1 - 1.0 K/uL   Eosinophils Relative 1 %    Eosinophils Absolute 0.1 0.0 - 0.5 K/uL   Basophils Relative 1 %   Basophils Absolute 0.1 0.0 - 0.1 K/uL   Immature Granulocytes 1 %   Abs Immature Granulocytes 0.06 0.00 - 0.07 K/uL   Burr Cells PRESENT     Comment: Performed at [facility], [address], [place], [place] [address]  Comprehensive metabolic panel     Status: Abnormal   Collection Time: 08/04/18  3:17 AM  Result Value Ref Range   Sodium 132 (L) 135 - 145 mmol/L   Potassium 3.5 3.5 - 5.1 mmol/L   Chloride 101 98 - 111 mmol/L   CO2 25 22 - 32 mmol/L   Glucose, Bld 165 (H) 70 - 99 mg/dL   BUN 11 8 - 23 mg/dL   Creatinine, Ser 0.74 0.61 - 1.24 mg/dL   Calcium 7.7 (L) 8.9 - 10.3 mg/dL   Total Protein 5.8 (L) 6.5 - 8.1 g/dL   Albumin 2.1 (L) 3.5 - 5.0 g/dL   AST 47 (H) 15 - 41 U/L   ALT 28 0 - 44 U/L   Alkaline Phosphatase 53 38 - 126 U/L   Total Bilirubin 0.8 0.3 - 1.2 mg/dL   GFR calc non Af Amer >60 >60 mL/min   GFR calc Af Amer >60 >60 mL/min    Comment: (NOTE) The eGFR  has been calculated using the CKD EPI equation. This calculation has not been validated in all clinical situations. eGFR's persistently <60 mL/min signify possible Chronic Kidney Disease.    Anion gap 6 5 - 15    Comment: Performed at [facility] [address] [place], [place] [address]  Glucose, capillary     Status: Abnormal   Collection Time: 08/04/18 11:37 AM  Result Value Ref Range   Glucose-Capillary 174 (H) 70 - 99 mg/dL  Glucose, capillary     Status: Abnormal   Collection Time: 08/04/18  4:08 PM  Result Value Ref Range   Glucose-Capillary 176 (H) 70 - 99 mg/dL  Glucose, capillary     Status: Abnormal   Collection Time: 08/04/18  9:40 PM  Result Value Ref Range   Glucose-Capillary 190 (H) 70 - 99 mg/dL   Comment 1 Notify RN   Glucose, capillary     Status: Abnormal   Collection Time: 08/05/18  6:08 AM  Result Value Ref Range   Glucose-Capillary 166 (H) 70 - 99 mg/dL   Comment 1 Notify RN   CBC with  Differential/Platelet     Status: Abnormal   Collection Time: 08/05/18  7:50 AM  Result Value Ref Range   WBC 8.5 4.0 - 10.5 K/uL   RBC 3.45 (L) 4.22 - 5.81 MIL/uL   Hemoglobin 10.0 (L) 13.0 - 17.0 g/dL   HCT 30.3 (L) 39.0 - 52.0 %   MCV 87.8 80.0 - 100.0 fL   MCH 29.0 26.0 - 34.0 pg   MCHC 33.0 30.0 - 36.0 g/dL   RDW 12.6 11.5 - 15.5 %   Platelets 498 (H) 150 - 400 K/uL   nRBC 0.0 0.0 - 0.2 %   Neutrophils Relative % 68 %   Neutro Abs 5.8 1.7 - 7.7 K/uL   Lymphocytes Relative 17 %   Lymphs Abs 1.5 0.7 - 4.0 K/uL   Monocytes Relative 12 %   Monocytes Absolute 1.0 0.1 - 1.0 K/uL   Eosinophils Relative 1 %   Eosinophils Absolute 0.1 0.0 - 0.5 K/uL   Basophils Relative 1 %   Basophils Absolute 0.1 0.0 - 0.1 K/uL   Immature Granulocytes 1 %   Abs Immature Granulocytes 0.05 0.00 - 0.07 K/uL    Comment: Performed at [facility], [address] [place], [place] [address]  Basic metabolic panel     Status: Abnormal   Collection Time: 08/05/18  7:50 AM  Result Value Ref Range   Sodium 135 135 - 145 mmol/L   Potassium 3.7 3.5 - 5.1 mmol/L   Chloride 103 98 - 111 mmol/L   CO2 27 22 - 32 mmol/L   Glucose, Bld 199 (H) 70 - 99 mg/dL   BUN 5 (L) 8 - 23 mg/dL   Creatinine, Ser 0.69 0.61 - 1.24 mg/dL   Calcium 8.4 (L) 8.9 - 10.3 mg/dL   GFR calc non Af Amer >60 >60 mL/min   GFR calc Af Amer >60 >60 mL/min    Comment: (NOTE) The eGFR has been calculated using the CKD EPI equation. This calculation has not been validated in all clinical situations. eGFR's persistently <60 mL/min signify possible Chronic Kidney Disease.    Anion gap 5 5 - 15    Comment: Performed at [facility] [address], [place], [place] [address]  Glucose, capillary     Status: Abnormal   Collection Time: 08/05/18 11:16 AM  Result Value Ref Range  Glucose-Capillary 236 (H) 70 - 99 mg/dL   Comment 1 Notify RN    Comment 2 Document in Chart    Dg Chest 2 View  Result Date:  08/04/2018 CLINICAL DATA:  Pneumonia EXAM: CHEST - 2 VIEW COMPARISON:  Chest x-rays dated 08/03/2018 and 08/02/2018. FINDINGS: Persistent opacity at the RIGHT lung base, pneumonia versus atelectasis. LEFT lung appears clear. Heart size and mediastinal contours are within normal limits. No acute or suspicious osseous finding. IMPRESSION: Persistent opacity at the RIGHT lung base, not significantly changed compared to yesterday's exam, compatible with atelectasis or pneumonia. Electronically Signed   By: Franki Cabot M.D.   On: 08/04/2018 21:43       Medical Problem List and Plan: 1. 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  DVT Prophylaxis/Anticoagulation: Subcutaneous Lovenox. Monitor for any bleeding episodes 3. Pain Management:  Dilaudid 2 mg every four hours as needed 4. Mood:  Provide emotional support 5. Neuropsych: This patient is capable of making decisions on his own behalf. 6. Skin/Wound Care:  Routine skin checks 7. Fluids/Electrolytes/Nutrition:  Routine and out's with follow-up chemistries 8. Acute blood loss anemia. Follow-up CBC 9. Diabetes mellitus with peripheral neuropathy. Hemoglobin A1c 7.4. Glucophage 500 mg twice a day. Check blood sugars before meals and at bedtime. Diabetic teaching 10. Hypertension. Norvasc 10 mg daily, Coreg 6.25 mg twice a day, hydralazine 50 mg every 8 hours 11.Tobacco abuse. NicoDerm patch. Provide counseling 12. Hyperlipidemia. Lipitor   Post Admission Physician Evaluation: 1. Preadmission assessment reviewed and changes made below. 2. Functional deficits secondary  to left BKA. 3. Patient is admitted to receive collaborative, interdisciplinary care between the physiatrist, rehab nursing staff, and therapy team. 4. Patient's level of medical complexity and substantial therapy needs in context of that medical necessity cannot be provided at a lesser intensity of care such as a SNF. 5. Patient  has experienced substantial functional loss from his/her baseline which was documented above under the "Functional History" and "Functional Status" headings.  Judging by the patient's diagnosis, physical exam, and functional history, the patient has potential for functional progress which will result in measurable gains while on inpatient rehab.  These gains will be of substantial and practical use upon discharge  in facilitating mobility and self-care at the household level. 6. Physiatrist will provide 24 hour management of medical needs as well as oversight of the therapy plan/treatment and provide guidance as appropriate regarding the interaction of the two. 7. 24 hour rehab nursing will assist with safety, skin/wound care, disease management, pain management and patient education  and help integrate therapy concepts, techniques,education, etc. 8. PT will assess and treat for/with: Lower extremity strength, range of motion, stamina, balance, functional mobility, safety, adaptive techniques and equipment, wound care, coping skills, pain control, education. Goals are: Mod I. 9. OT will assess and treat for/with: ADL's, functional mobility, safety, upper extremity strength, adaptive techniques and equipment, wound mgt, ego support, and community reintegration.   Goals are: Mod I. Therapy may not proceed with showering this patient. 10. Case Management and Social Worker will assess and treat for psychological issues and discharge planning. 11. Team conference will be held weekly to assess progress toward goals and to determine barriers to discharge. 12. Patient will receive at least 3 hours of therapy per day at least 5 days per week. 13. ELOS: 5-8 days.       14. Prognosis:  good  I have personally performed a face to face diagnostic evaluation, including,  but not limited to relevant history and physical exam findings, of this patient and developed relevant assessment and plan.  Additionally, I have  reviewed and concur with the physician assistant's documentation above.  The patient's status has not changed. The original post admission physician evaluation remains appropriate, and any changes from the pre-admission screening or documentation from the acute chart are noted above.    Delice Lesch, MD, ABPMR Lavon Paganini Angiulli, PA-C 08/05/2018

## 2018-08-05 NOTE — PMR Pre-admission (Signed)
PMR Admission Coordinator Pre-Admission Assessment  Patient: [name]. is an 67 y.o., male MRN: [id number] DOB: Mar 17, 1951 Height: (charted wrong pt.) Weight: 63.4 kg              Insurance Information HMO:     PPO:      PCP:      IPA:      80/20:      OTHER:  PRIMARY: Pt is uninsured (self pay)      Policy#:       Subscriber:  CM Name:       Phone#:      Fax#:  Pre-Cert#:       Employer:  Benefits:  Phone #:      Name:  Eff. Date:      Deduct:       Out of Pocket Max:       Life Max:  CIR: Pt is aware of estimated daily cost of care ($3,300); pt has been provided with financial counselor's name and number       SNF:  Outpatient:      Co-Pay:  Home Health:       Co-Pay:  DME:      Co-Pay:  Providers:  SECONDARY:       Policy#:       Subscriber:  CM Name:       Phone#:      Fax#:  Pre-Cert#:       Employer:  Benefits:  Phone #:      Name:  Eff. Date:      Deduct:       Out of Pocket Max:       Life Max:  CIR:       SNF:  Outpatient:      Co-Pay:  Home Health:       Co-Pay:  DME:      Co-Pay:   Medicaid Application Date:       Case Manager:  Disability Application Date:       Case Worker:   Emergency Contact Information Contact Information    Name Relation Home Work Mobile   Kilbourne,Clay Daughter [phone]       Current Medical History  Patient Admitting Diagnosis: Peripheral arterial disease status post left below-knee amputation postoperative  History of Present Illness: Manson Luckadoo a 67 year old right-handed male history of diabetes mellitus as well as hypertension, tobacco abuse. Per chart review patient lives with spouse independent with assistive device prior to admission. One level home with plan for ramped entrance.Wife works during the day. 3 children in the area.Presented 07/20/2018 with left foot pain 3 months with tip of the first digit left foot black and discolored.x-rays and imaging revealed no fracture deformity. 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 Servando Snare. Hospital course pain management weaned from PCA to PO Dilaudid.Stump sock per [employer] prosthetics. Subcutaneous Lovenox for DVT prophylaxis. Acute blood loss anemia 10.0 and monitored. Skin tear left elbow after attempting to get OOB without assist.Therapy evaluations completed with recommendations of physical medicine rehabilitation consult. Patient is to be admitted for a compressive rehabilitation program on 08/05/18.      Past Medical History  Past Medical History:  Diagnosis Date  . Arthritis   . Diabetes mellitus without  complication ([place])   . Emesis   . Heart murmur   . Hyperglycemia   . Hypertension   . Joint pain     Family History  family history includes Cancer in his sister.  Prior Rehab/Hospitalizations:  Has the patient had major surgery during 100 days prior to admission? No  Current Medications   Current Facility-Administered Medications:  .  0.9 %  sodium chloride infusion, 250 mL, Intravenous, PRN, Ulyses Amor, PA-C, Last Rate: 10 mL/hr at 08/04/18 1811, 250 mL at 08/04/18 1811 .  acetaminophen (TYLENOL) tablet 650 mg, 650 mg, Oral, Q6H PRN, Ulyses Amor, PA-C, 650 mg at 08/02/18 0401 .  amLODipine (NORVASC) tablet 10 mg, 10 mg, Oral, Daily, Laurence Slate M, PA-C, 10 mg at 08/05/18 0910 .  aspirin EC tablet 81 mg, 81 mg, Oral, Daily, Laurence Slate M, PA-C, 81 mg at 08/05/18 0910 .  atorvastatin (LIPITOR) tablet 20 mg, 20 mg, Oral, q1800, Ulyses Amor, PA-C, 20 mg at 08/04/18 1643 .  carvedilol (COREG) tablet 6.25 mg, 6.25 mg, Oral, BID WC, Collins, Emma M, PA-C, 6.25 mg at 08/05/18 0910 .  clopidogrel (PLAVIX) tablet 75 mg, 75 mg, Oral, Q  breakfast, Laurence Slate M, PA-C, 75 mg at 08/05/18 0910 .  enoxaparin (LOVENOX) injection 40 mg, 40 mg, Subcutaneous, Q24H, Collins, Emma M, PA-C, 40 mg at 08/05/18 1330 .  hydrALAZINE (APRESOLINE) tablet 50 mg, 50 mg, Oral, Q8H, Collins, Emma M, PA-C, 50 mg at 08/05/18 1329 .  HYDROmorphone (DILAUDID) tablet 2 mg, 2 mg, Oral, Q4H PRN, Verlon Au, Jai-Gurmukh, MD .  labetalol (NORMODYNE,TRANDATE) injection 10 mg, 10 mg, Intravenous, Q10 min PRN, Theda Sers, Emma M, PA-C .  lactated ringers bolus 500 mL, 500 mL, Intravenous, Once, [place], Emma M, PA-C .  lactated ringers infusion, , Intravenous, Continuous, Ulyses Amor, PA-C, Last Rate: 50 mL/hr at 07/30/18 1241 .  lactated ringers infusion, , Intravenous, Continuous, Collins, Emma M, PA-C .  metFORMIN (GLUCOPHAGE) tablet 500 mg, 500 mg, Oral, BID WC, Collins, Emma M, PA-C, 500 mg at 08/05/18 0910 .  nicotine (NICODERM CQ - dosed in mg/24 hours) patch 14 mg, 14 mg, Transdermal, Daily, Laurence Slate M, PA-C, 14 mg at 08/05/18 0913 .  ondansetron (ZOFRAN) injection 4 mg, 4 mg, Intravenous, Q6H PRN, Theda Sers, Emma M, PA-C .  potassium chloride SA (K-DUR,KLOR-CON) CR tablet 40 mEq, 40 mEq, Oral, Daily, Laurence Slate M, PA-C, 40 mEq at 08/05/18 3976  Patients Current Diet:  Diet Order            Diet - low sodium heart healthy        Diet Heart Room service appropriate? 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 cane  Prior Functional Level Prior Function Level of Independence: Independent with assistive device(s) Comments: tub only no  shower so he does sponge baths  Self Care: Did the patient need help bathing, dressing, using the toilet or eating?  Independent  Indoor Mobility: Did the patient need assistance with walking from room to room (with or without device)? Independent  Stairs: Did the patient need assistance with internal or external stairs (with or without device)? Independent  Functional Cognition: Did the patient need help planning regular tasks such as shopping or remembering to take medications? Independent  Current Functional Level Cognition  Overall Cognitive Status: Impaired/Different from baseline Orientation Level: Oriented X4 Safety/Judgement: Decreased awareness of safety, Decreased awareness of deficits General Comments: pt continues to be impulsive and does not show appropriate safety awareness. Also asked him to have family bring in R shoe and he agreed but after session when he was calling them he didn't remmeber what he was supposed to ask and was confused by the whole conversation    Extremity Assessment (includes Sensation/Coordination)  Upper Extremity Assessment: Overall WFL for tasks assessed  Lower Extremity Assessment: Defer to PT evaluation(s/p L BKA)    ADLs  Overall ADL's : Needs assistance/impaired Eating/Feeding: Set up, Sitting Grooming: Set up, Sitting Grooming Details (indicate cue type and reason): minimal A for standing balance at sink Upper Body Bathing: Set up, Sitting Lower Body Bathing: Moderate assistance, Sitting/lateral leans Upper Body Dressing : Set up, Sitting Lower Body Dressing: Moderate assistance, Sitting/lateral leans Lower  Body Dressing Details (indicate cue type and reason): can don [place] shoe and manage pants after toileting with min guard assist for standing balance Toilet Transfer: Minimal assistance, Ambulation, RW, BSC Toilet Transfer Details (indicate cue type and reason): 3n1 over toilet. simulated with walking to/from bathroom and recliner transfer. Min A for LOB on turns. Pt reporting increased pain in L heel during ambulation Toileting- Clothing Manipulation and Hygiene: Moderate assistance, Sitting/lateral lean Tub/ Shower Transfer: Minimal assistance Functional mobility during ADLs: Minimal assistance, Rolling walker, Cueing for sequencing, Cueing for safety General ADL Comments: pt instructed in how to manage phantom pain and in compensatory strategies for LB ADL avoiding standing    Mobility  Overal bed mobility: Needs Assistance Bed Mobility: Supine to Sit Supine to sit: Supervision General bed mobility comments: supervision for safety    Transfers  Overall transfer level: Needs assistance Equipment used: Rolling walker (2 wheeled) Transfers: Sit to/from Stand Sit to Stand: Min assist General transfer comment: min A to steady and vc's to remain seated until lines and RW prepared    Ambulation / Gait / Stairs / Wheelchair Mobility  Ambulation/Gait Ambulation/Gait assistance: Herbalist (Feet): 15 Feet Assistive device: Rolling walker (2 wheeled) Gait Pattern/deviations: (hopping) General Gait Details: donned post op shoe R foot for protection and cushion. Pt continues to hop too far with RLE placing LE's under RW and epxeriencing a posterior LOB with min/ mod A to correct. Pt verbalizes that he understands why this is happening but  continues to hop too far Gait velocity: decr Gait velocity interpretation: <1.31 ft/sec, indicative of household ambulator    Posture / Balance Balance Overall balance assessment: Needs assistance Sitting-balance support: No upper extremity  supported, Feet supported Sitting balance-Leahy Scale: Fair Postural control: Posterior lean Standing balance support: Bilateral upper extremity supported Standing balance-Leahy Scale: Poor Standing balance comment: reliant on B UEs and external support    Special needs/care consideration BiPAP/CPAP: No  CPM: no Continuous Drip IV: 0.9% sodium chloride infusion  Dialysis: no        Days: NA Life Vest:  No Oxygen: no Special Bed: no Trach Size:no Wound Vac (area): no     Location: no Skin: surgical incision on LLE, skin tear Left elbow                           Bowel mgmt: continent, last BM: 08/04/18 Bladder mgmt: continent, use of urinal Diabetic mgmt: yes. Pt unaware of diabetes PTA     Previous Home Environment Living Arrangements: Spouse/significant other Available Help at Discharge: Available PRN/intermittently, Family(wife works) Type of Home: House Home Layout: One level Home Access: Other (comment)(land lord has agreed to build a ramp) Entrance Stairs-Rails: Can reach both Entrance Stairs-Number of Steps: 5 Bathroom Shower/Tub: Tub only Biochemist, clinical: Standard Bathroom Accessibility: Yes How Accessible: Accessible via walker Home Care Services: No  Discharge Living Setting Plans for Discharge Living Setting: Patient's home, Lives with (comment)(lives with wife; daughter and wife will split the day) Type of Home at Discharge: House Discharge Home Layout: Two level, Able to live on main level with bedroom/bathroom Alternate Level Stairs-Rails: Right, Left(unknown; NA) Alternate Level Stairs-Number of Steps: full flight Discharge Home Access: Stairs to enter Entrance Stairs-Rails: Can reach both Entrance Stairs-Number of Steps: 5 steps to enter Discharge Bathroom Shower/Tub: Tub/shower unit Discharge Bathroom Toilet: Handicapped height Discharge Bathroom Accessibility: Yes How Accessible: Accessible via wheelchair Does the patient have any problems obtaining  your medications?: No  Social/Family/Support Systems Patient Roles: Spouse, Parent(has grown daughter) Contact Information: wife: [name]): [phone]; daughter [name]): [phone] Anticipated Caregiver: wife and daugther to cover most of day Anticipated Caregiver's Contact Information: see above Ability/Limitations of Caregiver: Min A Caregiver Availability: Other (Comment)(close to 24/7 Coverage) Discharge Plan Discussed with Primary Caregiver: Yes(with wife and patient) Is Caregiver In Agreement with Plan?: Yes Does Caregiver/Family have Issues with Lodging/Transportation while Pt is in Rehab?: No   Goals/Additional Needs Patient/Family Goal for Rehab: PT/OT: Modified Independent; SLP: NA Expected length of stay: 7-10 dyas Cultural Considerations: NA Dietary Needs: Heart Healthy, thin liquids Equipment Needs: TBD Pt/Family Agrees to Admission and willing to participate: Yes Program Orientation Provided & Reviewed with Pt/Caregiver Including Roles  & Responsibilities: Yes(reviewed with wife and pt)  Barriers to Discharge: Inaccessible home environment, Home environment access/layout, New diabetic, Insurance for SNF coverage, Weight bearing restrictions  Barriers to Discharge Comments: steps to enter home; unknown diabetic   Decrease burden of Care through IP rehab admission:   Possible need for SNF placement upon discharge: Not anticipated. Pt has good social support at home and has good prognosis for further progress.    Patient Condition: This patient's condition remains as documented in the consult dated 08/04/18, in which the Rehabilitation Physician determined and documented that the patient's condition is appropriate for intensive rehabilitative care in an inpatient rehabilitation facility. Will admit to inpatient rehab today.  Preadmission Screen Completed By:  Jhonnie Garner, 08/05/2018 3:52 PM ______________________________________________________________________    Discussed status with Dr. Posey Pronto on 08/05/18 at 4:04PM and received telephone approval for admission today.  Admission Coordinator:  Jhonnie Garner, time 4:04PM/Date 08/05/18.

## 2018-08-06 ENCOUNTER — Inpatient Hospital Stay (HOSPITAL_COMMUNITY): Payer: Self-pay | Admitting: Physical Therapy

## 2018-08-06 ENCOUNTER — Inpatient Hospital Stay (HOSPITAL_COMMUNITY): Payer: Self-pay | Admitting: Occupational Therapy

## 2018-08-06 ENCOUNTER — Inpatient Hospital Stay (HOSPITAL_COMMUNITY): Payer: Self-pay

## 2018-08-06 DIAGNOSIS — Z89512 Acquired absence of left leg below knee: Secondary | ICD-10-CM

## 2018-08-06 DIAGNOSIS — E1151 Type 2 diabetes mellitus with diabetic peripheral angiopathy without gangrene: Secondary | ICD-10-CM

## 2018-08-06 DIAGNOSIS — I10 Essential (primary) hypertension: Secondary | ICD-10-CM

## 2018-08-06 LAB — GLUCOSE, CAPILLARY
GLUCOSE-CAPILLARY: 180 mg/dL — AB (ref 70–99)
GLUCOSE-CAPILLARY: 157 mg/dL — AB (ref 70–99)
GLUCOSE-CAPILLARY: 132 mg/dL — AB (ref 70–99)
Glucose-Capillary: 135 mg/dL — ABNORMAL HIGH (ref 70–99)

## 2018-08-06 LAB — CBC WITH DIFFERENTIAL/PLATELET
ABS IMMATURE GRANULOCYTES: 0.06 10*3/uL (ref 0.00–0.07)
BASOS PCT: 1 %
Basophils Absolute: 0.1 10*3/uL (ref 0.0–0.1)
EOS ABS: 0.2 10*3/uL (ref 0.0–0.5)
Eosinophils Relative: 3 %
HCT: 28.5 % — ABNORMAL LOW (ref 39.0–52.0)
Hemoglobin: 9.4 g/dL — ABNORMAL LOW (ref 13.0–17.0)
Immature Granulocytes: 1 %
Lymphocytes Relative: 23 %
Lymphs Abs: 2 10*3/uL (ref 0.7–4.0)
MCH: 28.8 pg (ref 26.0–34.0)
MCHC: 33 g/dL (ref 30.0–36.0)
MCV: 87.4 fL (ref 80.0–100.0)
MONOS PCT: 10 %
Monocytes Absolute: 0.9 10*3/uL (ref 0.1–1.0)
NEUTROS ABS: 5.3 10*3/uL (ref 1.7–7.7)
Neutrophils Relative %: 62 %
PLATELETS: 482 10*3/uL — AB (ref 150–400)
RBC: 3.26 MIL/uL — ABNORMAL LOW (ref 4.22–5.81)
RDW: 12.8 % (ref 11.5–15.5)
WBC: 8.6 10*3/uL (ref 4.0–10.5)
nRBC: 0 % (ref 0.0–0.2)

## 2018-08-06 LAB — COMPREHENSIVE METABOLIC PANEL
ALT: 33 U/L (ref 0–44)
AST: 42 U/L — ABNORMAL HIGH (ref 15–41)
Albumin: 2.2 g/dL — ABNORMAL LOW (ref 3.5–5.0)
Alkaline Phosphatase: 61 U/L (ref 38–126)
Anion gap: 6 (ref 5–15)
BUN: 6 mg/dL — ABNORMAL LOW (ref 8–23)
CHLORIDE: 103 mmol/L (ref 98–111)
CO2: 24 mmol/L (ref 22–32)
CREATININE: 0.62 mg/dL (ref 0.61–1.24)
Calcium: 8.1 mg/dL — ABNORMAL LOW (ref 8.9–10.3)
GFR calc non Af Amer: >60 mL/min (ref >60)
Glucose, Bld: 195 mg/dL — ABNORMAL HIGH (ref 70–99)
Potassium: 3.3 mmol/L — ABNORMAL LOW (ref 3.5–5.1)
Sodium: 133 mmol/L — ABNORMAL LOW (ref 135–145)
TOTAL PROTEIN: 6.3 g/dL — AB (ref 6.5–8.1)
Total Bilirubin: 0.7 mg/dL (ref 0.3–1.2)

## 2018-08-06 NOTE — Progress Notes (Addendum)
Physical Therapy Session Note  Patient Details  Name: John Treto Sr. MRN: [id number] Date of Birth: [birth year]  Today's Date: 08/06/2018 PT Individual Time: 7829-5621 PT Individual Time Calculation (min): 73 min   Short Term Goals: Week 1:  PT Short Term Goal 1 (Week 1): STG=LTG due to ELOS  Skilled Therapeutic Interventions/Progress Updates:  Pt received asleep in bed but easily awakened & agreeable to tx. Pt reports 7/10 pain in L residual limb but declines asking for pain meds. Pt transfers to EOB with mod I & bed features and squat pivot to w/c on R with CGA. Educated pt on amputee pad management & pt able to place it on w/c. Pt propels w/c to gym with BUE & supervision, propelling through cones to simulate small spaces with distant supervision with very good accuracy. Pt reports his wife drives a [employer], [employer] and his daughter drives a [employer] SUV and he is unsure of which one he will ride home in - pt completed car transfer at SUV simulated seat height of 30" via ambulation with RW & steady assist. Therapist educates pt on hand placement on stable surface for sit<>stand transfers to increase safety as pt is somewhat impulsive and will attempt to have BUE on RW during transfers. Also educated pt on w/c parts management and how to set up w/c for squat pivot transfer with pt able to do so with assistance. Pt transferred w/c<>mat table via squat pivot with supervision and sit<>supine and rolling supine<>prone with mod I. Provided pt with LLE TKA HEP & pt performed the following: glute sets, isometric hip extension, sidelying hip extension, and seated long arc quads with occasional cuing for technique. Educated pt on need to maintain L knee extension to prevent flexion contractures. Pt transferred to prone but noted no stretch in L hip therefore pt progressed to prone on elbows with pt reporting L hip flexor stretch. Pt returned to w/c via squat pivot with supervision and instructed on &  performed LLE quad sets with instructional cuing for technique. Pt propels w/c gym>dayroom>room with supervision & utilizes BUE ergometer on level 6 x 4 minutes forwards + 4 minutes backwards for BUE strengthening & cardiopulmonary endurance training. At end of session pt returned to bed & was left in bed with alarm set & call bell in reach.  Pt reports impaired vision but this has been occurring for awhile - pt has some difficulty managing w/c parts but has been able to do so without physical assistance on this date.   Pt reports his family can probably have a ramp installed by this weekend as therapist educated him on short ELOS.  Therapy Documentation Precautions:  Precautions Precautions: Fall Precaution Comments: impulsive Restrictions Weight Bearing Restrictions: Yes LLE Weight Bearing: Non weight bearing Other Position/Activity Restrictions: L BKA    Therapy/Group: Individual Therapy  Waunita Schooner 08/06/2018, 2:49 PM

## 2018-08-06 NOTE — Evaluation (Signed)
Occupational Therapy Assessment and Plan  Patient Details  Name: John Klug Sr. MRN: [id number] Date of Birth: 1951/07/10  OT Diagnosis: acute pain and muscle weakness (generalized) Rehab Potential: Rehab Potential (ACUTE ONLY): Good ELOS: 5-8 days   Today's Date: 08/06/2018 OT Individual Time: 0900-1000 OT Individual Time Calculation (min): 60 min     Problem List:  Patient Active Problem List   Diagnosis Date Noted  . Left below-knee amputee ([place]) 08/05/2018  . Dyslipidemia   . Acute blood loss anemia   . Tobacco abuse   . Benign essential HTN   . PAD (peripheral artery disease) ([place]) 07/20/2018  . Cellulitis in diabetic foot ([place]) 07/20/2018  . Diabetes mellitus, type II ([place]) 07/20/2018  . Cellulitis 07/20/2018  . Dry gangrene ([place]) 07/20/2018    Past Medical History:  Past Medical History:  Diagnosis Date  . Arthritis   . Diabetes mellitus without complication ([place])   . Emesis   . Heart murmur   . Hyperglycemia   . Hypertension   . Joint pain    Past Surgical History:  Past Surgical History:  Procedure Laterality Date  . AMPUTATION Left 08/02/2018   Procedure: LEFT AMPUTATION BELOW KNEE;  Surgeon: [name];  Location: [place];  Service: Vascular;  Laterality: Left;  . ELBOW SURGERY     Bone graft to repair elbow  . LOWER EXTREMITY ANGIOGRAPHY N/A 07/24/2018   Procedure: LOWER EXTREMITY ANGIOGRAPHY;  Surgeon: [name];  Location: [place];  Service: Cardiovascular;  Laterality: N/A;  . PERIPHERAL VASCULAR INTERVENTION  07/24/2018   Procedure: PERIPHERAL VASCULAR INTERVENTION;  Surgeon: [name];  Location: [facility];  Service: Cardiovascular;;  . TONSILLECTOMY    . TRANSMETATARSAL AMPUTATION Left 07/25/2018   Procedure: LEFT GREAT TOE AMPUTATION;  Surgeon: [name];  Location: [place];  Service: Vascular;  Laterality: Left;  . TRANSMETATARSAL AMPUTATION Left 07/30/2018   Procedure: TRANSMETATARSAL AMPUTATION;  Surgeon: [name];  Location: [facility];  Service: Vascular;  Laterality: Left;    Assessment & Plan Clinical Impression: Patient is a 67 y.o. right-handed male history of diabetes mellitus as well as hypertension, tobacco abuse. Per chart review and patient, patient lives with spouse independent with assistive device prior to admission. One level home with plan for ramped entrance.Wife works during the day. 3 children in the area. Presented 07/20/2018 with left foot pain 3 months with tip of the first digit left foot black and discolored. X-rays and imaging revealed no fracture deformity. Cystic lesion first metatarsal head. 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 Servando Case. Patient had continues nonhealing and underwent transtibial amputation on 11/9. Hospital course pain management weaned from PCA to PO Dilaudid. Stump sock per [employer] prosthetics. Subcutaneous Lovenox for DVT prophylaxis. Acute blood loss anemia 10.0 and monitored. Skin tear left elbow after attempting to get OOB without assist.Therapy evaluations completed with recommendations of physical medicine rehabilitation consult. Patient was admitted for a compressive rehabilitation program.  Patient transferred to CIR on 08/05/2018 .    Patient currently requires min with basic self-care skills secondary to muscle weakness, decreased cardiorespiratoy endurance and decreased standing balance and decreased balance strategies.  Prior to hospitalization, patient could complete ADLs with modified independent .  Patient will benefit from skilled intervention to increase independence with basic self-care skills prior to  discharge home with care partner.  Anticipate patient will require intermittent supervision and follow up home health.  OT - End of Session Activity Tolerance: Tolerates 30+ min activity with multiple rests Endurance Deficit: Yes Endurance Deficit Description: requires frequent rest breaks OT Assessment Rehab Potential (ACUTE ONLY): Good OT Barriers to Discharge: Decreased caregiver support OT Barriers to Discharge Comments: wife works OT Patient demonstrates impairments in the following area(s): Balance;Endurance;Motor;Pain;Safety OT Basic ADL's Functional Problem(s): Bathing;Dressing;Toileting OT Advanced ADL's Functional Problem(s): Simple Meal Preparation OT Transfers Functional Problem(s): Toilet OT Additional Impairment(s): None OT Plan OT Intensity: Minimum of 1-2 x/day, 45 to 90 minutes OT Frequency: 5 out of 7 days OT Duration/Estimated Length of Stay: 5-8 days OT Treatment/Interventions: Balance/vestibular training;Discharge planning;Disease Lawyer;Functional mobility training;Pain management;Patient/family education;Psychosocial support;Self Care/advanced ADL retraining;Skin care/wound managment;Splinting/orthotics;Therapeutic Activities;Therapeutic Exercise;UE/LE Strength taining/ROM;Wheelchair propulsion/positioning OT Basic Self-Care Anticipated Outcome(s): Mod I OT Toileting Anticipated Outcome(s): Mod I OT Bathroom Transfers Anticipated Outcome(s): Mod I OT Recommendation Patient destination: Home Follow Up Recommendations: Home health OT Equipment Recommended: To be determined   Skilled Therapeutic Intervention OT eval completed with discussion of rehab process, OT purpose, POC, ELOS, and goals.  ADL assessment completed with bathing and dressing at sit > stand level at sink.  Pt reports having tub only at home and would typically sponge bathe PTA, therapist recommending return to sponge bathe upon d/c.  Pt utilized  lateral leans for majority of LB bathing and even doffed underwear in sitting with leans.  Able to complete sit > stand with min assist- contact guard and pull pants over hips with only contact guard for standing balance.  Min assist stand pivot transfers throughout session.  Educated on importance of knee flexion for mobility and to prepare for prosthesis, with pt reporting understanding.  Applied amputee support pad for LLE on w/c to continue to provide good positioning when upright in w/c.  Pt left upright in w/c with all needs in reach.  OT Evaluation Precautions/Restrictions  Precautions Precautions: Fall Precaution Comments: impulsive Restrictions Weight Bearing Restrictions: Yes LLE Weight Bearing: Non weight bearing Other Position/Activity Restrictions: L BKA Pain Pain Assessment Pain Scale: 0-10 Pain Score: 7  Pain Type: Acute pain Pain Location: Leg Pain Orientation: Left Pain Descriptors / Indicators: Aching Pain Intervention(s): Repositioned(premedicated) Home Living/Prior Functioning Home Living Family/patient expects to be discharged to:: Private residence Living Arrangements: Spouse/significant other Available Help at Discharge: Available PRN/intermittently, Family(wife works, has adult daughter who can assist intermittently) Type of Home: House Home Access: Other (comment), Stairs to enter(landlord has agreed to built a ramp) Technical brewer of Steps: 5 Home Layout: One level Bathroom Shower/Tub: Tub only Armed forces training and education officer: Yes  Lives With: Spouse IADL History Homemaking Responsibilities: Yes Meal Prep Responsibility: Secondary Laundry Responsibility: No Cleaning Responsibility: No Bill Paying/Finance Responsibility: Primary Shopping Responsibility: Primary Prior Function Level of Independence: Independent with basic ADLs, Requires assistive device for independence(used a single point cane) Driving: No Leisure:  Hobbies-yes (Comment)(wood working) Comments: tub only no shower so he does sponge baths ADL ADL Eating: Independent Where Assessed-Eating: Bed level Grooming: Independent Where Assessed-Grooming: Wheelchair Upper Body Bathing: Setup Where Assessed-Upper Body Bathing: Wheelchair, Sitting at sink Lower Body Bathing: Setup Where Assessed-Lower Body Bathing: Wheelchair, Sitting at sink Upper Body Dressing: Setup Where Assessed-Upper Body Dressing: Wheelchair, Sitting at sink Lower Body Dressing: Minimal assistance Where Assessed-Lower Body Dressing: Wheelchair, Sitting at sink, Standing at sink  Toilet Transfer: Minimal Print production planner Method: Stand Ecologist: Grab bars Vision Baseline Vision/History: Wears glasses Wears Glasses: At all times Patient Visual Report: No change from baseline(due for new glasses) Vision Assessment?: No apparent visual deficits Cognition Overall Cognitive Status: Within Functional Limits for tasks assessed Arousal/Alertness: Awake/alert Orientation Level: Person;Place;Situation Person: Oriented Place: Oriented Situation: Oriented Year: 2019 Month: November Day of Week: Correct Immediate Memory Recall: Sock;Blue;Bed Memory Recall: Blue;Sock;Bed Memory Recall Sock: Without Cue Memory Recall Blue: Without Cue Memory Recall Bed: Without Cue Attention: Alternating Sensation Sensation Light Touch: Appears Intact Coordination Gross Motor Movements are Fluid and Coordinated: No Fine Motor Movements are Fluid and Coordinated: Yes Motor  Motor Motor: Within Functional Limits Motor - Skilled Clinical Observations: generalized weakness Mobility  Transfers Sit to Stand: Contact Guard/Touching assist  Trunk/Postural Assessment  Cervical Assessment Cervical Assessment: Exceptions to WFL(forward head posture) Thoracic Assessment Thoracic Assessment: Within Functional Limits Lumbar Assessment Lumbar Assessment: Within  Functional Limits Postural Control Postural Control: Within Functional Limits  Balance Balance Balance Assessed: Yes Static Sitting Balance Static Sitting - Level of Assistance: 5: Stand by assistance Dynamic Sitting Balance Dynamic Sitting - Level of Assistance: 5: Stand by assistance Static Standing Balance Static Standing - Level of Assistance: 4: Min assist Dynamic Standing Balance Dynamic Standing - Level of Assistance: 4: Min assist Extremity/Trunk Assessment RUE Assessment RUE Assessment: Within Functional Limits LUE Assessment LUE Assessment: Within Functional Limits     Refer to Care Plan for Long Term Goals  Recommendations for other services: None    Discharge Criteria: Patient will be discharged from OT if patient refuses treatment 3 consecutive times without medical reason, if treatment goals not met, if there is a change in medical status, if patient makes no progress towards goals or if patient is discharged from hospital.  The above assessment, treatment plan, treatment alternatives and goals were discussed and mutually agreed upon: by patient  Simonne Come 08/06/2018, 10:51 AM

## 2018-08-06 NOTE — Progress Notes (Signed)
Social Work  Social Work Assessment and Plan  Patient Details  Name: John Winch Sr. MRN: [id number] Date of Birth: 1950/11/13  Today's Date: 08/06/2018  Problem List:  Patient Active Problem List   Diagnosis Date Noted  . Left below-knee amputee ([place]) 08/05/2018  . Dyslipidemia   . Acute blood loss anemia   . Tobacco abuse   . Benign essential HTN   . PAD (peripheral artery disease) ([place]) 07/20/2018  . Cellulitis in diabetic foot ([place]) 07/20/2018  . Diabetes mellitus, type II ([place]) 07/20/2018  . Cellulitis 07/20/2018  . Dry gangrene ([place]) 07/20/2018   Past Medical History:  Past Medical History:  Diagnosis Date  . Arthritis   . Diabetes mellitus without complication ([place])   . Emesis   . Heart murmur   . Hyperglycemia   . Hypertension   . Joint pain    Past Surgical History:  Past Surgical History:  Procedure Laterality Date  . AMPUTATION Left 08/02/2018   Procedure: LEFT AMPUTATION BELOW KNEE;  Surgeon: Marty Heck, MD;  Location: [place] [facility];  Service: Vascular;  Laterality: Left;  . ELBOW SURGERY     Bone graft to repair elbow  . LOWER EXTREMITY ANGIOGRAPHY N/A 07/24/2018   Procedure: LOWER EXTREMITY ANGIOGRAPHY;  Surgeon: Marty Heck, MD;  Location: [facility];  Service: Cardiovascular;  Laterality: N/A;  . PERIPHERAL VASCULAR INTERVENTION  07/24/2018   Procedure: PERIPHERAL VASCULAR INTERVENTION;  Surgeon: Marty Heck, MD;  Location: [facility];  Service: Cardiovascular;;  . TONSILLECTOMY    . TRANSMETATARSAL AMPUTATION Left 07/25/2018   Procedure: LEFT GREAT TOE AMPUTATION;  Surgeon: Marty Heck, MD;  Location: [place];  Service: Vascular;  Laterality: Left;  . TRANSMETATARSAL AMPUTATION Left 07/30/2018   Procedure: TRANSMETATARSAL AMPUTATION;  Surgeon: Waynetta Sandy, MD;  Location: [facility];  Service: Vascular;  Laterality: Left;   Social History:  reports that he has been smoking cigarettes. He has been  smoking about 0.50 packs per day. He quit smokeless tobacco use about 29 years ago.  His smokeless tobacco use included snuff. He reports that he drinks about 12.0 standard drinks of alcohol per week. He reports that he does not use drugs.  Family / Support Systems Marital Status: Married Patient Roles: Spouse, Parent Spouse/Significant Other: Kitty [phone] Children: Clay-daughter [phone] Other Supports: Friends and other children Anticipated Caregiver: Wife and daughter Ability/Limitations of Caregiver: Both work different shifts can provide coverage with maybe 1-3 hours alone Caregiver Availability: Other (Comment)(Close to 24 hr care) Family Dynamics: Close knit family who will assist one another. Pt has always been independent and taken care of himself. He doesn't like to ask for help, he would rather do himself. He feels he has adequate support.  Social History Preferred language: English Religion:  Cultural Background: No issues Education: High School Read: Yes Write: Yes Employment Status: Retired Freight forwarder Issues: No issues Guardian/Conservator: None-according to MD pt is capable of making his own decisions while here   Abuse/Neglect Abuse/Neglect Assessment Can Be Completed: Yes Physical Abuse: Denies Verbal Abuse: Denies Sexual Abuse: Denies Exploitation of patient/patient's resources: Denies Self-Neglect: Denies  Emotional Status Pt's affect, behavior adn adjustment status: Pt is motivated to do well he is tired from therapies they were back to back this am. He is glad to be here and knows he will get good therapies here. He wants to be at least independent from a wc level by the time he goes home. Recent Psychosocial Issues:  other health issues Pyschiatric History: No history would benefit from seeing neuro-psych if here long enough for his substance abuse issues. He seems to be coping appropriately with his amputation. Feels it could not  be avoided. Substance Abuse History: Tobacco and ETOH plans on quitting and is aware of the health issues associated with these substances. Will discuss resources while here.  Patient / Family Perceptions, Expectations & Goals Pt/Family understanding of illness & functional limitations: Pt and daughter can explain his amputation and the reason for it. He realizes he needs to change his lifestyle and go to the MD. He has a good understanding of his condition and treatment plan going forward. Premorbid pt/family roles/activities: Husband, father, retiree, grandfather, friend, etc Anticipated changes in roles/activities/participation: resume Pt/family expectations/goals: Pt states: " I want to be able to do for myself before I leave here, that is my plan."  Daughter states: " I hope he can do some but we will assist him, he's my Daddy."  [employer]: None Premorbid Home Care/DME Agencies: None Transportation available at discharge: family Resource referrals recommended: Neuropsychology, Support group (specify)  Discharge Planning Living Arrangements: Spouse/significant other Support Systems: Spouse/significant other, Children, Friends/neighbors Type of Residence: Private residence [employer]: (applying for [employer] to 67 yo) Museum/gallery curator Resources: Biomedical scientist, Radio broadcast assistant Screen Referred: Yes Living Expenses: Rent Money Management: Patient, Spouse Does the patient have any problems obtaining your medications?: Yes (Describe)(wasnt' taking meds prior to admission or seeing a MD) Home Management: Wife Patient/Family Preliminary Plans: Return home with wife and daughter coming in the am while wife works to make sure he has what he needs. He is hoping to get independent with a walker before he leaves here, but wants to go home soon also. Await therapy team evalutions. Social Work Anticipated Follow Up Needs: HH/OP, Support Group  Clinical  Impression Pleasant gentleman who is motivate dot do well and recover form his amputee. He will do whatever is asked and aware he will have some pain with this. Will work on [employer] eligibility due to he is 67 yo, contacted Development worker, community and work on a PCP for him for follow up. Await therapy team evaluations.  Elease Hashimoto 08/06/2018, 12:06 PM

## 2018-08-06 NOTE — Evaluation (Signed)
Physical Therapy Assessment and Plan  Patient Details  Name: [name]. MRN: [id number] Date of Birth: Apr 29, 1951  PT Diagnosis: Difficulty walking, Edema, Muscle weakness and Pain in joint Rehab Potential: Good ELOS: 5-7 days   Today's Date: 08/06/2018 PT Individual Time: 1000-1100 PT Individual Time Calculation (min): 60 min    Problem List:  Patient Active Problem List   Diagnosis Date Noted  . Left below-knee amputee ([place]) 08/05/2018  . Dyslipidemia   . Acute blood loss anemia   . Tobacco abuse   . Benign essential HTN   . PAD (peripheral artery disease) ([place]) 07/20/2018  . Cellulitis in diabetic foot ([place]) 07/20/2018  . Diabetes mellitus, type II ([place]) 07/20/2018  . Cellulitis 07/20/2018  . Dry gangrene ([place]) 07/20/2018    Past Medical History:  Past Medical History:  Diagnosis Date  . Arthritis   . Diabetes mellitus without complication ([place])   . Emesis   . Heart murmur   . Hyperglycemia   . Hypertension   . Joint pain    Past Surgical History:  Past Surgical History:  Procedure Laterality Date  . AMPUTATION Left 08/02/2018   Procedure: LEFT AMPUTATION BELOW KNEE;  Surgeon: Marty Heck, MD;  Location: [facility];  Service: Vascular;  Laterality: Left;  . ELBOW SURGERY     Bone graft to repair elbow  . LOWER EXTREMITY ANGIOGRAPHY N/A 07/24/2018   Procedure: LOWER EXTREMITY ANGIOGRAPHY;  Surgeon: Marty Heck, MD;  Location: [facility];  Service: Cardiovascular;  Laterality: N/A;  . PERIPHERAL VASCULAR INTERVENTION  07/24/2018   Procedure: PERIPHERAL VASCULAR INTERVENTION;  Surgeon: Marty Heck, MD;  Location: [facility];  Service: Cardiovascular;;  . TONSILLECTOMY    . TRANSMETATARSAL AMPUTATION Left 07/25/2018   Procedure: LEFT GREAT TOE AMPUTATION;  Surgeon: Marty Heck, MD;  Location: [facility];  Service: Vascular;  Laterality: Left;  . TRANSMETATARSAL AMPUTATION Left 07/30/2018   Procedure: TRANSMETATARSAL  AMPUTATION;  Surgeon: Waynetta Sandy, MD;  Location: [facility];  Service: Vascular;  Laterality: Left;    Assessment & Plan Clinical Impression: Patient is a 67 y.o. year old male with history of diabetes mellitus as well as hypertension, tobacco abuse. Per chart review and patient, patient lives with spouse independent with assistive device prior to admission. One level home with plan for ramped entrance.Wife works during the day. 3 children in the area. Presented 07/20/2018 with left foot pain 3 months with tip of the first digit left foot black and discolored. X-rays and imaging revealed no fracture deformity. Cystic lesion first metatarsal head. 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 Servando Snare. Patient had continues nonhealing and underwent transtibial amputation on 11/9. Hospital course pain management weaned from PCA to PO Dilaudid. Stump sock per [employer] prosthetics. Subcutaneous Lovenox for DVT prophylaxis. Acute blood loss anemia 10.0 and monitored. Skin tear left elbow after attempting to get OOB without assist.Therapy evaluations completed with recommendations of physical medicine rehabilitation consult. Patient was admitted for a compressive rehabilitation program.  Patient transferred to CIR on 08/05/2018 .   Patient currently requires min with mobility secondary to muscle weakness, decreased coordination and decreased standing balance and decreased balance strategies.  Prior to hospitalization, patient was modified independent  with mobility and lived with Spouse  in a House home.  Home access is 5Ramped entrance(working on building a ramp ).  Patient will benefit from skilled PT intervention to maximize safe  functional mobility, minimize fall risk and decrease caregiver burden for planned discharge home with intermittent assist.  Anticipate patient will benefit from follow up [facility] at discharge.  PT - End of Session Activity Tolerance: Tolerates 30+ min activity with multiple rests Endurance Deficit: Yes Endurance Deficit Description: requires frequent rest breaks PT Assessment Rehab Potential (ACUTE/IP ONLY): Good PT Patient demonstrates impairments in the following area(s): Balance;Behavior;Safety;Endurance;Motor;Pain PT Transfers Functional Problem(s): Bed Mobility;Bed to Chair;Car;Furniture;Floor PT Locomotion Functional Problem(s): Ambulation;Wheelchair Mobility;Stairs PT Plan PT Intensity: Minimum of 1-2 x/day ,45 to 90 minutes PT Frequency: 5 out of 7 days PT Duration Estimated Length of Stay: 5-7 days PT Treatment/Interventions: Ambulation/gait training;Community reintegration;Neuromuscular re-education;Stair training;DME/adaptive equipment instruction;UE/LE Strength taining/ROM;Wheelchair propulsion/positioning;Balance/vestibular training;Discharge planning;Pain management;Skin care/wound management;Therapeutic Activities;UE/LE Coordination activities;Cognitive remediation/compensation;Disease management/prevention;Functional mobility training;Patient/family education;Splinting/orthotics;Therapeutic Exercise PT Transfers Anticipated Outcome(s): Mod I PT Locomotion Anticipated Outcome(s): supervision PT Recommendation Recommendations for Other Services: Neuropsych consult Follow Up Recommendations: Home health PT Patient destination: Home Equipment Recommended: To be determined  Skilled Therapeutic Intervention Evaluation completed (see details above and below) with education on PT POC and goals and individual treatment initiated with focus on functional mobility, safety, transfers and gait. Pt propelled w/c from room>gym x 200 ft with supervision and B UEs. Therapist educated pt on w/cpart  management, pt performed with cues. Pt performed transfer to mat with CGA squat piv and therapist brought pt 16x16 w/c for better fit. Supervision for bed mobility on mat. Pt performed car transfer with min assist and cues for techniques. Pt propelled to gym with supervision and ambulated x 50 ft with RW and min assist, verbal cues for techniques and swing through technique vs hop. Pt ascended/descended 2 steps (3 inch) with B handrails and min assist. Therapist educated pt on quad sets for LE strength and ROM. Therapist discussed use of shrinker for edema management and desensitization for pain management. Pt propelled back to room and left seated in w/c with chair alarm set.   PT Evaluation Precautions/Restrictions Precautions Precautions: Fall Precaution Comments: impulsive Restrictions Weight Bearing Restrictions: Yes LLE Weight Bearing: Non weight bearing Other Position/Activity Restrictions: L BKA Pain Pain Assessment Pain Scale: 0-10 Pain Score: 7  Pain Type: Acute pain Pain Location: Leg Pain Orientation: Left Pain Descriptors / Indicators: Aching Pain Intervention(s): Repositioned(premedicated) Home Living/Prior Functioning Home Living Available Help at Discharge: Available PRN/intermittently;Family(wife works first shift, daughter works second shift, both can assist when available) Type of Home: House Home Access: Ramped entrance(working on building a ramp ) Entrance Stairs-Number of Steps: [place]: One level Bathroom Shower/Tub: Tub only Biochemist, clinical: Standard Bathroom Accessibility: Yes  Lives With: Spouse Prior Function Level of Independence: Independent with basic ADLs;Requires assistive device for independence(used a cane PRN) Driving: No Leisure: Hobbies-yes (Comment) Comments: wood working, yard work Hotel manager Status: Within Advertising copywriter for tasks assessed Arousal/Alertness: Awake/alert Orientation Level: Oriented X4 Attention:  Alternating Risk analyst Light Touch: Appears Intact Coordination Gross Motor Movements are Fluid and Coordinated: No Fine Motor Movements are Fluid and Coordinated: Yes Motor  Motor Motor: Within Functional Limits Motor - Skilled Clinical Observations: generalized weakness  Mobility Bed Mobility Bed Mobility: Supine to Sit;Sit to Supine Supine to Sit: Supervision/Verbal cueing Sit to Supine: Supervision/Verbal cueing Transfers Transfers: Sit to Stand;Stand Pivot Transfers Sit to Stand: Contact Guard/Touching assist Stand Pivot Transfers: Minimal Assistance - Patient > 75%  Stand Pivot Transfer Details: Verbal cues for technique;Verbal cues for precautions/safety Transfer (Assistive device): Rolling walker Locomotion  Gait Ambulation: Yes Gait Assistance: Minimal Assistance - Patient > 75% Gait Distance (Feet): 50 Feet Assistive device: Rolling walker Gait Assistance Details: Verbal cues for gait pattern;Verbal cues for technique;Verbal cues for precautions/safety Stairs / Additional Locomotion Stairs: Yes Stairs Assistance: Minimal Assistance - Patient > 75% Stair Management Technique: Two rails Number of Stairs: 2 Height of Stairs: 3  Trunk/Postural Assessment  Cervical Assessment Cervical Assessment: Exceptions to WFL(forward head posture) Thoracic Assessment Thoracic Assessment: Within Functional Limits Lumbar Assessment Lumbar Assessment: Within Functional Limits Postural Control Postural Control: Within Functional Limits  Balance Balance Balance Assessed: Yes Static Sitting Balance Static Sitting - Level of Assistance: 5: Stand by assistance Dynamic Sitting Balance Dynamic Sitting - Level of Assistance: 5: Stand by assistance Static Standing Balance Static Standing - Level of Assistance: 4: Min assist Dynamic Standing Balance Dynamic Standing - Level of Assistance: 4: Min assist Extremity Assessment  RLE Assessment RLE Assessment: Within Functional  Limits LLE Assessment Passive Range of Motion (PROM) Comments: WFL at the hip and knee, L BKA General Strength Comments: grossly 3-/5 to 4/5 at hip and knee, limited testing secondary to pain    Refer to Care Plan for Long Term Goals  Recommendations for other services: Neuropsych  Discharge Criteria: Patient will be discharged from PT if patient refuses treatment 3 consecutive times without medical reason, if treatment goals not met, if there is a change in medical status, if patient makes no progress towards goals or if patient is discharged from hospital.  The above assessment, treatment plan, treatment alternatives and goals were discussed and mutually agreed upon: by patient  Netta Corrigan, PT, DPT 08/06/2018, 10:58 AM

## 2018-08-06 NOTE — Care Management Note (Signed)
[facility] Individual Statement of Services  Patient Name:  John Penkala Sr.  Date:  08/06/2018  Welcome to the [facility].  Our goal is to provide you with an individualized program based on your diagnosis and situation, designed to meet your specific needs.  With this comprehensive rehabilitation program, you will be expected to participate in at least 3 hours of rehabilitation therapies Monday-Friday, with modified therapy programming on the weekends.  Your rehabilitation program will include the following services:  Physical Therapy (PT), Occupational Therapy (OT), 24 hour per day rehabilitation nursing, Neuropsychology, Case Management (Social Worker), Rehabilitation Medicine, Nutrition Services and Pharmacy Services  Weekly team conferences will be held on Wednesday to discuss your progress.  Your Social Worker will talk with you frequently to get your input and to update you on team discussions.  Team conferences with you and your family in attendance may also be held.  Expected length of stay: 5-7 days  Overall anticipated outcome: independent with device  Depending on your progress and recovery, your program may change. Your Social Worker will coordinate services and will keep you informed of any changes. Your Social Worker's name and contact numbers are listed  below.  The following services may also be recommended but are not provided by the [facility]:   [name] will be made to provide these services after discharge if needed.  Arrangements include referral to agencies that provide these services.  Your insurance has been verified to be:  Applying for Medicare Your primary doctor is:  None  Pertinent information will be shared with your doctor and your insurance company.  Social Worker:  Ovidio Kin, [place] or ([phone]  Information discussed with and copy given to patient by: Elease Hashimoto, 08/06/2018, 12:08 PM

## 2018-08-06 NOTE — Progress Notes (Signed)
[facility] PHYSICAL MEDICINE & REHABILITATION PROGRESS NOTE   Subjective/Complaints:  Has some phantom limb sensation but no pain  ROS- neg CP, SOB, N/V/D  Objective:   Dg Chest 2 View  Result Date: 08/04/2018 CLINICAL DATA:  Pneumonia EXAM: CHEST - 2 VIEW COMPARISON:  Chest x-rays dated 08/03/2018 and 08/02/2018. FINDINGS: Persistent opacity at the RIGHT lung base, pneumonia versus atelectasis. LEFT lung appears clear. Heart size and mediastinal contours are within normal limits. No acute or suspicious osseous finding. IMPRESSION: Persistent opacity at the RIGHT lung base, not significantly changed compared to yesterday's exam, compatible with atelectasis or pneumonia. Electronically Signed   By: Franki Cabot M.D.   On: 08/04/2018 21:43   Recent Labs    08/05/18 0750 08/06/18 0500  WBC 8.5 8.6  HGB 10.0* 9.4*  HCT 30.3* 28.5*  PLT 498* 482*   Recent Labs    08/05/18 0750 08/06/18 0500  NA 135 133*  K 3.7 3.3*  CL 103 103  CO2 27 24  GLUCOSE 199* 195*  BUN 5* 6*  CREATININE 0.69 0.62  CALCIUM 8.4* 8.1*    Intake/Output Summary (Last 24 hours) at 08/06/2018 0838 Last data filed at 08/05/2018 2223 Gross per 24 hour  Intake -  Output 550 ml  Net -550 ml     Physical Exam: Vital Signs Blood pressure 127/77, pulse 71, temperature 98.5 F (36.9 C), temperature source Oral, resp. rate 17, height 5\' 11"  (1.803 m), weight 64.1 kg, SpO2 97 %.   General: No acute distress ENT  Poor dentition  Mood and affect are appropriate Heart: Regular rate and rhythm no rubs murmurs or extra sounds Lungs: Clear to auscultation, breathing unlabored, no rales or wheezes Abdomen: Positive bowel sounds, soft nontender to palpation, nondistended Extremities: No clubbing, cyanosis, or edema Skin: No evidence of breakdown, no evidence of rash, Left BK incision CDI, staples intact moderate edema Neurologic: Cranial nerves II through XII intact, motor strength is 5/5 in bilateral  deltoid, bicep, tricep, grip,RIght  hip flexor, knee extensors, ankle dorsiflexor and plantar flexor, 4/5 left HF Sensory exam normal sensation to light touch and pp in RIght  lower extremities Cerebellar exam normal finger to nose to finger as well as heel to shin in bilateral upper and lower extremities Musculoskeletal: Left BKA  No joint swelling   Assessment/Plan: 1. 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  Physiatrist is providing close team supervision and 24 hour management of active medical problems listed below.  Physiatrist and rehab team continue to assess barriers to discharge/monitor patient progress toward functional and medical goals  Care Tool:  Bathing              Bathing assist       Upper Body Dressing/Undressing Upper body dressing        Upper body assist      Lower Body Dressing/Undressing Lower body dressing            Lower body assist       Toileting Toileting    Toileting assist       Transfers Chair/bed transfer  Transfers assist           Locomotion Ambulation   Ambulation assist              Walk 10 feet activity   Assist           Walk  50 feet activity   Assist           Walk 150 feet activity   Assist           Walk 10 feet on uneven surface  activity   Assist           Wheelchair     Assist               Wheelchair 50 feet with 2 turns activity    Assist            Wheelchair 150 feet activity     Assist            Medical Problem List and Plan: 1. Decreased functional mobility secondary to ultimate left transtibial amputation 08/03/2018 CIR evals today, team conf in am 2.  DVT Prophylaxis/Anticoagulation: Subcutaneous Lovenox. Monitor for any bleeding episodes 3. Pain Management:  Dilaudid 2 mg every four  hours as needed 4. Mood:  Provide emotional support 5. Neuropsych: This patient is capable of making decisions on his own behalf. 6. Skin/Wound Care:  Routine skin checks 7. Fluids/Electrolytes/Nutrition:  Routine and out's with follow-up chemistries 8. Acute blood loss anemia. Follow-up CBC 9. Diabetes mellitus with peripheral neuropathy. Hemoglobin A1c 7.4. Glucophage 500 mg twice a day. Check blood sugars before meals and at bedtime. Diabetic teaching CBG (last 3)  Recent Labs    08/05/18 1621 08/05/18 2042 08/06/18 0655  GLUCAP 164* 133* 180*  Borderline high this am advise against pm snack 10. Hypertension. Norvasc 10 mg daily, Coreg 6.25 mg twice a day, hydralazine 50 mg every 8 hours- well controlled 11/12 Vitals:   08/06/18 0502 08/06/18 0517  BP:  127/77  Pulse: 76 71  Resp: 18 17  Temp: 98.5 F (36.9 C) 98.5 F (36.9 C)  SpO2: 95% 97%  11.Tobacco abuse. NicoDerm patch. Provide counseling 12. Hyperlipidemia. Lipitor 13.  Hypokalemia likely nutritional add KCL supplement LOS: 1 days A FACE TO FACE EVALUATION WAS PERFORMED  Charlett Blake 08/06/2018, 8:38 AM

## 2018-08-07 ENCOUNTER — Inpatient Hospital Stay (HOSPITAL_COMMUNITY): Payer: Self-pay | Admitting: Occupational Therapy

## 2018-08-07 ENCOUNTER — Inpatient Hospital Stay (HOSPITAL_COMMUNITY): Payer: Self-pay | Admitting: Physical Therapy

## 2018-08-07 DIAGNOSIS — I739 Peripheral vascular disease, unspecified: Secondary | ICD-10-CM

## 2018-08-07 LAB — GLUCOSE, CAPILLARY
GLUCOSE-CAPILLARY: 126 mg/dL — AB (ref 70–99)
Glucose-Capillary: 140 mg/dL — ABNORMAL HIGH (ref 70–99)
Glucose-Capillary: 153 mg/dL — ABNORMAL HIGH (ref 70–99)

## 2018-08-07 NOTE — Patient Care Conference (Signed)
Inpatient RehabilitationTeam Conference and Plan of Care Update Date: 08/07/2018   Time: 10:55 Am    Patient Name: [name].      Medical Record Number: [id number]  Date of Birth: [birth year] Sex: Male         Room/Bed: 4W24C/4W24C-01 Payor Info: Payor: MEDICAID POTENTIAL / Plan: MEDICAID POTENTIAL / Product Type: *No Product type* /    Admitting Diagnosis: L BKA  Admit Date/Time:  08/05/2018  6:50 PM Admission Comments: No comment available   Primary Diagnosis:  <principal problem not specified> Principal Problem: <principal problem not specified>  Patient Active Problem List   Diagnosis Date Noted  . Left below-knee amputee ([place]) 08/05/2018  . Dyslipidemia   . Acute blood loss anemia   . Tobacco abuse   . Benign essential HTN   . PAD (peripheral artery disease) ([place]) 07/20/2018  . Cellulitis in diabetic foot ([place]) 07/20/2018  . Diabetes mellitus, type II ([place]) 07/20/2018  . Cellulitis 07/20/2018  . Dry gangrene ([place]) 07/20/2018    Expected Discharge Date: Expected Discharge Date: 08/10/18  Team Members Present: Physician leading conference: Dr. Delice Lesch Social Worker Present: Ovidio Kin, LCSW Nurse Present: Rayetta Pigg, RN PT Present: Lavone Nian, PT OT Present: Simonne Come, OT SLP Present: Stormy Fabian, SLP PPS Coordinator present : Daiva Nakayama, RN, CRRN     Current Status/Progress Goal Weekly Team Focus  Medical   left BKA, pain controlled. COPD, wound healing  prepare medically for discharge  wound care, stump mgt, optimizing nutrition.    Bowel/Bladder   Pt is continent of B/B. LBM 08/06/2018  Maintain regular B/B pattern  Assist with toileting needs PRN   Swallow/Nutrition/ Hydration             ADL's   Min assist - min guard  Mod I overall  ADL retraining, dynamic standing balance, squat pivot and ambulatory transfers, limb care   Mobility   CGA<>min assist gait with RW up to 50 ft, CGA car transfer, CGA<>close supervision squat  pivot transfer, supervision bed mobility, supervision w/c mobility  mod I transfers & w/c mobility, supervision gait with LRAD  transfers, gait, pt education, LLE strengthening, endurance training, d/c planning   Communication             Safety/Cognition/ Behavioral Observations            Pain   Pt complains of pain to L BKA site. Pain manged by Dilaudid   Pain <5  Assess and manage pain Q shift and PRN   Skin   Pt has an incision to L BKA site that is stapled. Pt has a skin tear on L elbow.  Maintain skin integrity and prevent skin breakdown.   Assess skin Q shift and PRN      *See Care Plan and progress notes for long and short-term goals.     Barriers to Discharge  Current Status/Progress Possible Resolutions Date Resolved   Physician    Medical stability               Nursing  Wound Care               PT  Decreased caregiver support  pt has brief window of time he will be home alone during the day; pt has family planning to install a ramp prior to d/c              OT Decreased caregiver support  wife works  SLP                SW                Discharge Planning/Teaching Needs:  HOme with wife and daughter providing supervision-will be 2-3 hours may be alone. Doing well in his therapies      Team Discussion:  Goals supervision-mod/i level. Doing well in his therapies and will reach soon. Pain managed on dilaudid-MD reports need to decrease and adjust pain meds for home. Pt wants to go home soon.  Revisions to Treatment Plan:  DC 11/16    Continued Need for Acute Rehabilitation Level of Care: The patient requires daily medical management by a physician with specialized training in physical medicine and rehabilitation for the following conditions: Daily direction of a multidisciplinary physical rehabilitation program to ensure safe treatment while eliciting the highest outcome that is of practical value to the patient.: Yes Daily medical management of  patient stability for increased activity during participation in an intensive rehabilitation regime.: Yes Daily analysis of laboratory values and/or radiology reports with any subsequent need for medication adjustment of medical intervention for : Post surgical problems   I attest that I was present, lead the team conference, and concur with the assessment and plan of the team.   Elease Hashimoto 08/07/2018, 1:05 PM

## 2018-08-07 NOTE — Progress Notes (Signed)
Social Work Patient ID: John Laurence Sr., male   DOB: 03/20/1951, 67 y.o.   MRN: [id number] Met with pt to inform him of team conference goals supervision-mod/i level and target discharge 11/16. Pt is very happy to be going home, made aware MD to adjust his pain meds. Work on discharge needs.

## 2018-08-07 NOTE — Progress Notes (Signed)
Occupational Therapy Session Note  Patient Details  Name: John Blanchfield Sr. MRN: [id number] Date of Birth: [birth year]  Today's Date: 08/07/2018 OT Individual Time: 2355-7322 OT Individual Time Calculation (min): 58 min    Short Term Goals: Week 1:  OT Short Term Goal 1 (Week 1): STG = LTGs due to ELOS  Skilled Therapeutic Interventions/Progress Updates:    Treatment session with focus on ADL retraining and care for residual limb. Pt received upright in bed declining bathing and dressing, but reporting need to toilet.  Completed squat pivot transfer bed > w/c > toilet with contact guard.  Pt completed clothing management in standing with min guard and alternating UE support.  Pt propelled w/c to therapy gym with setup assist for leg rests.  Pt completed transfers w/c <> therapy mat supervision.  Engaged in LLE therex in supine and sidelying with focus on hip flexion/extension, abduction/adduction, and knee extension.  Pt reports pleased with progress and understanding of recommendations to continue focus on hip and knee mobility to allow for fit of prosthesis.  Provided pt with handouts regarding residual limb care with limb inspection and desensitization strategies.  Therapist educated on each aspect of handout with pt verbalizing understanding.  Issued a limb inspection mirror for pt to utilize.  Pt returned to room and set up w/c for transfer with min cues for safety and completed transfer with supervision.  Therapy Documentation Precautions:  Precautions Precautions: Fall Precaution Comments: impulsive Restrictions Weight Bearing Restrictions: Yes LLE Weight Bearing: Non weight bearing(BKA) Other Position/Activity Restrictions: L BKA Pain: Pain Assessment Pain Score: 0-No pain   Therapy/Group: Individual Therapy  Simonne Come 08/07/2018, 12:29 PM

## 2018-08-07 NOTE — Progress Notes (Signed)
Patient had a productive day participating in therapy. Patient complains of pain and managed with Tylenol 650 mg po q 6 hours. Patient B/P at 1330 100/59. Patient asleep at 1400 and hydralazine not given. Rechecked B/P at 1758 when patient awake for 153/74. Continue with plan of care.  Mliss Sax

## 2018-08-07 NOTE — Progress Notes (Addendum)
Physical Therapy Session Note  Patient Details  Name: John Doren Sr. MRN: [id number] Date of Birth: [birth year]  Today's Date: 08/07/2018 PT Individual Time: 6568-1275 and 1425-1535 PT Individual Time Calculation (min): 58 min and 70 min  Short Term Goals: Week 1:  PT Short Term Goal 1 (Week 1): STG=LTG due to ELOS  Skilled Therapeutic Interventions/Progress Updates:  Treatment 1: Pt received in bed & agreeable to tx. Educated pt on d/c date and DME recommendations. Pt reports his ramp has already been installed at home and his landlord is currently making his bathroom handicap accessible. Discussed use of RW in home and w/c in community. Pt transfers to EOB with mod I & HOB elevated and bed>w/c via squat pivot with supervision. Pt able to manage w/c parts with min cuing and propel self to gym, over ramp in [employer]	[place], down to & around gift shop and back to unit with BUE & mod I. Educated pt on need to wear shrinker sock 24/7 and to wash one if it is soiled, as well as timeline for LLE prosthetic. Outside of gift shop therapist educated pt on how to transport food/drink items in [place] and ability to use bag on back of w/c to transport items; educated pt he is unable to drive with pt reporting he doesn't drive at baseline as he does not have his license. Back on unit pt engaged in jumbo connect four game while standing with RW and min assist with intermittent 1 UE support on RW while reaching outside of BOS to obtain tokens then place them in slot with task focusing on standing balance and endurance. Therapist provides cuing for proper hand placement for sit>stand transfers as pt continues to attempt with BUE on RW. Pt ambulates 100 ft with RW & CGA increasing to min assist as pt fatigues with 1 episode of R knee instability. Pt reports L phantom sensation ("it feels like my toes are itching") with therapist educating pt on desensitization techniques. At end of session pt left sitting in w/c  with chair alarm donned, call bell in reach, set up with meal tray & NT in room.   Treatment 2: Pt received in w/c & agreeable to tx. No c/o pain reported. Pt propels w/c around unit with mod I & BUE. Pt completes squat pivot transfer w/c>mat table with distant supervision with only cuing to move R leg rest out of the way. Sitting EOM pt performed bicep curls, chest press & overhead press with 8# weighted bar (5# bar + 3# weight added) with cuing for technique. Pt performed diagonals with 2 Kg (4.4#) weighted ball for core & BUE strengthening. Pt transferred to quadruped on mat table and performed bird dog exercises with supervision with task focusing on strengthening and core/trunk control with therapist providing cuing to protect residual limb. Pt reported 7/10 fatigue after task & rest breaks taken PRN. While long sitting on mat table pt performed LLE quad sets with pt able to achieve full knee extension. While sitting EOM pt performed weighted bar ball taps with 4# bar with task focusing on sitting balance (RLE supported), endurance training, and BUE strengthening with pt performing 2 minutes 40 seconds + 3 minutes with a rest break in between. Pt returns to w/c with set up assist and manages B leg rests without assistance. In rehab apartment pt ambulates w/c<>bed with RW with CGA with improving recall of hand placement for sit>stand transfers with questioning cuing. Pt completes sit<>supine with mod I with pt reporting  he sits on bed mostly and does not have a couch. Back in w/c therapist assessed LLE without shrinker donned & pt noted to have bloody drainage - pt returned to room & RN Loree Fee) made aware & assessed pt. When krillex was removed pt was noted to not have any drainage and pt cleared to wear shrinker sock only. Pt donned shrinker without assistance. Pt set up w/c with min cuing for R leg rest management and returned to bed via squat pivot with supervision. Pt left in bed with alarm set & needs  in reach.   Therapy Documentation Precautions:  Precautions Precautions: Fall Precaution Comments: impulsive Restrictions Weight Bearing Restrictions: Yes LLE Weight Bearing: Non weight bearing Other Position/Activity Restrictions: L BKA   Therapy/Group: Individual Therapy  Waunita Schooner 08/07/2018, 3:43 PM

## 2018-08-07 NOTE — Progress Notes (Signed)
[facility] PHYSICAL MEDICINE & REHABILITATION PROGRESS NOTE   Subjective/Complaints:  No new complaints. Pain controlled. Pleased with progress  ROS: Patient denies fever, rash, sore throat, blurred vision, nausea, vomiting, diarrhea, cough, shortness of breath or chest pain, joint or back pain, headache, or mood change.    Objective:   No results found. Recent Labs    08/05/18 0750 08/06/18 0500  WBC 8.5 8.6  HGB 10.0* 9.4*  HCT 30.3* 28.5*  PLT 498* 482*   Recent Labs    08/05/18 0750 08/06/18 0500  NA 135 133*  K 3.7 3.3*  CL 103 103  CO2 27 24  GLUCOSE 199* 195*  BUN 5* 6*  CREATININE 0.69 0.62  CALCIUM 8.4* 8.1*    Intake/Output Summary (Last 24 hours) at 08/07/2018 1026 Last data filed at 08/07/2018 0830 Gross per 24 hour  Intake 882 ml  Output 800 ml  Net 82 ml     Physical Exam: Vital Signs Blood pressure 127/77, pulse 82, temperature 98.5 F (36.9 C), temperature source Oral, resp. rate 15, height 5\' 11"  (1.803 m), weight 64.2 kg, SpO2 95 %.   Constitutional: No distress . Vital signs reviewed. HEENT: EOMI, oral membranes moist. Poor dentition Neck: supple Cardiovascular: RRR without murmur. No JVD    Respiratory: CTA Bilaterally without wheezes or rales. Normal effort    GI: BS +, non-tender, non-distended  Extremities: No clubbing, cyanosis, or edema Skin:  Left BK incision CDI, staples intact moderate edema--stable Neurologic: Cranial nerves II through XII intact, motor strength is 5/5 in bilateral deltoid, bicep, tricep, grip,RIght  hip flexor, knee extensors, ankle dorsiflexor and plantar flexor, 4/5 left HF Sensory exam normal sensation to light touch and pp in RIght  lower extremities Cerebellar exam normal finger to nose to finger as well as heel to shin in bilateral upper and lower extremities Musculoskeletal: Left BKA  No joint swelling, well formed   Assessment/Plan: 1. Functional deficits secondary to Left transtib amputation due  to PAD which require 3+ hours per day of interdisciplinary therapy in a comprehensive inpatient rehab setting.  Physiatrist is providing close team supervision and 24 hour management of active medical problems listed below.  Physiatrist and rehab team continue to assess barriers to discharge/monitor patient progress toward functional and medical goals  Care Tool:  Bathing    Body parts bathed by patient: Right arm, Left arm, Chest, Abdomen, Front perineal area, Buttocks, Right upper leg, Left upper leg     Body parts n/a: Left lower leg(Lt BKA)   Bathing assist Assist Level: Contact Guard/Touching assist     Upper Body Dressing/Undressing Upper body dressing   What is the patient wearing?: Pull over shirt    Upper body assist Assist Level: Set up assist    Lower Body Dressing/Undressing Lower body dressing      What is the patient wearing?: Underwear/pull up, Pants     Lower body assist Assist for lower body dressing: Contact Guard/Touching assist     Toileting Toileting    Toileting assist Assist for toileting: Minimal Assistance - Patient > 75%     Transfers Chair/bed transfer  Transfers assist     Chair/bed transfer assist level: Supervision/Verbal cueing     Locomotion Ambulation   Ambulation assist      Assist level: Minimal Assistance - Patient > 75% Assistive device: Walker-rolling Max distance: 50 ft   Walk 10 feet activity   Assist     Assist level: Minimal Assistance - Patient > 75%  Assistive device: Walker-rolling   Walk 50 feet activity   Assist    Assist level: Minimal Assistance - Patient > 75% Assistive device: Walker-rolling    Walk 150 feet activity   Assist Walk 150 feet activity did not occur: Safety/medical concerns         Walk 10 feet on uneven surface  activity   Assist Walk 10 feet on uneven surfaces activity did not occur: Safety/medical concerns         Wheelchair     Assist Will patient  use wheelchair at discharge?: Yes Type of Wheelchair: Manual    Wheelchair assist level: Supervision/Verbal cueing Max wheelchair distance: 150 ft    Wheelchair 50 feet with 2 turns activity    Assist        Assist Level: Supervision/Verbal cueing   Wheelchair 150 feet activity     Assist     Assist Level: Supervision/Verbal cueing      Medical Problem List and Plan: 1. Decreased functional mobility secondary to ultimate left transtibial amputation 08/03/2018   -team conference today 2.  DVT Prophylaxis/Anticoagulation: Subcutaneous Lovenox. Monitor for any bleeding episodes 3. Pain Management:  Dilaudid 2 mg every four hours as needed  -well controlled 4. Mood:  Provide emotional support 5. Neuropsych: This patient is capable of making decisions on his own behalf. 6. Skin/Wound Care:  continue local care, shrinker  -prosthetics consult prior to discharge 7. Fluids/Electrolytes/Nutrition:  Routine and out's with follow-up chemistries 8. Acute blood loss anemia. Follow-up CBC 9. Diabetes mellitus with peripheral neuropathy. Hemoglobin A1c 7.4. Glucophage 500 mg twice a day. Check blood sugars before meals and at bedtime. Diabetic teaching CBG (last 3)  Recent Labs    08/06/18 1653 08/06/18 2157 08/07/18 0641  GLUCAP 132* 135* 140*  Borderline to controlled. Continue with current plan 10. Hypertension. Norvasc 10 mg daily, Coreg 6.25 mg twice a day, hydralazine 50 mg every 8 hours- well controlled 11/13 Vitals:   08/06/18 2159 08/07/18 0419  BP: 119/73 127/77  Pulse: 72 82  Resp: 18 15  Temp: 98.3 F (36.8 C) 98.5 F (36.9 C)  SpO2: 100% 95%  11.Tobacco abuse. NicoDerm patch. Provide counseling 12. Hyperlipidemia. Lipitor 13.  Hypokalemia likely nutritional add KCL supplement LOS: 2 days A FACE TO FACE EVALUATION WAS PERFORMED  Meredith Staggers 08/07/2018, 10:26 AM

## 2018-08-08 ENCOUNTER — Inpatient Hospital Stay (HOSPITAL_COMMUNITY): Payer: Self-pay | Admitting: Occupational Therapy

## 2018-08-08 ENCOUNTER — Inpatient Hospital Stay (HOSPITAL_COMMUNITY): Payer: Self-pay | Admitting: Physical Therapy

## 2018-08-08 ENCOUNTER — Inpatient Hospital Stay (HOSPITAL_COMMUNITY): Payer: Self-pay

## 2018-08-08 LAB — GLUCOSE, CAPILLARY
GLUCOSE-CAPILLARY: 109 mg/dL — AB (ref 70–99)
GLUCOSE-CAPILLARY: 126 mg/dL — AB (ref 70–99)
Glucose-Capillary: 135 mg/dL — ABNORMAL HIGH (ref 70–99)
Glucose-Capillary: 96 mg/dL (ref 70–99)
Glucose-Capillary: 113 mg/dL — ABNORMAL HIGH (ref 70–99)

## 2018-08-08 NOTE — IPOC Note (Signed)
Overall Plan of Care (IPOC) Patient Details Name: John Foerster Sr. MRN: [id number] DOB: 1951-01-25  Admitting Diagnosis: <principal problem not specified>  Hospital Problems: Active Problems:   Left below-knee amputee [facility])     Functional Problem List: Nursing Safety, Skin Integrity, Pain  PT Balance, Behavior, Safety, Endurance, Motor, Pain  OT Balance, Endurance, Motor, Pain, Safety  SLP    TR         Basic ADL's: OT Bathing, Dressing, Toileting     Advanced  ADL's: OT Simple Meal Preparation     Transfers: PT Bed Mobility, Bed to Chair, Car, Furniture, Floor  OT Toilet     Locomotion: PT Ambulation, Emergency planning/management officer, Stairs     Additional Impairments: OT None  SLP        TR      Anticipated Outcomes Item Anticipated Outcome  Self Feeding    Swallowing      Basic self-care  Mod I  Toileting  Mod I   Bathroom Transfers Mod I  Bowel/Bladder  To remain continent  Transfers  Mod I  Locomotion  supervision  Communication     Cognition     Pain  Pain level of 2 or less  Safety/Judgment  Free from injuries or falls   Therapy Plan: PT Intensity: Minimum of 1-2 x/day ,45 to 90 minutes PT Frequency: 5 out of 7 days PT Duration Estimated Length of Stay: 5-7 days OT Intensity: Minimum of 1-2 x/day, 45 to 90 minutes OT Frequency: 5 out of 7 days OT Duration/Estimated Length of Stay: 5-8 days      Team Interventions: Nursing Interventions Patient/Family Education, Pain Management, Bladder Management, Skin Care/Wound Management  PT interventions Ambulation/gait training, Community reintegration, Neuromuscular re-education, IT trainer, Engineer, drilling, UE/LE Strength taining/ROM, Wheelchair propulsion/positioning, Training and development officer, Discharge planning, Pain management, Skin care/wound management, Therapeutic Activities, UE/LE Coordination activities, Cognitive remediation/compensation, Disease management/prevention,  Functional mobility training, Patient/family education, Splinting/orthotics, Therapeutic Exercise  OT Interventions Balance/vestibular training, Discharge planning, Disease mangement/prevention, DME/adaptive equipment instruction, Functional mobility training, Pain management, Patient/family education, Psychosocial support, Self Care/advanced ADL retraining, Skin care/wound managment, Splinting/orthotics, Therapeutic Activities, Therapeutic Exercise, UE/LE Strength taining/ROM, Wheelchair propulsion/positioning  SLP Interventions    TR Interventions    SW/CM Interventions Discharge Planning, Psychosocial Support, Patient/Family Education   Barriers to Discharge MD  Medical stability  Nursing Wound Care    PT Decreased caregiver support pt has brief window of time he will be home alone during the day; pt has family planning to install a ramp prior to d/c  OT Decreased caregiver support wife works  SLP      SW       Team Discharge Planning: Destination: PT-Home ,OT- Home , SLP-  Projected Follow-up: PT-Home health PT, OT-  Home health OT, SLP-  Projected Equipment Needs: PT-To be determined, OT- To be determined, SLP-  Equipment Details: PT- , OT-  Patient/family involved in discharge planning: PT- Patient,  OT-Patient, SLP-   MD ELOS: 5-7 days Medical Rehab Prognosis:  Excellent Assessment: The patient has been admitted for CIR therapies with the diagnosis of left BKA. The team will be addressing functional mobility, strength, stamina, balance, safety, adaptive techniques and equipment, self-care, bowel and bladder mgt, patient and caregiver education, pain mgt, pre-prosthetic education, wound care. Goals have been set at mod I for self-care and transfers and supervision for locomotion.    Meredith Staggers, MD, [facility]      See Team Conference Notes for weekly updates to the plan  of care

## 2018-08-08 NOTE — Progress Notes (Signed)
Physical Therapy Session Note  Patient Details  Name: John Munguia Sr. MRN: [id number] Date of Birth: 08/09/1951  Today's Date: 08/08/2018 PT Individual Time: 7893-8101 PT Individual Time Calculation (min): 75 min   Short Term Goals: Week 1:  PT Short Term Goal 1 (Week 1): STG=LTG due to ELOS  Skilled Therapeutic Interventions/Progress Updates:   Pt sitting up in bed, with shrinker sock below knee.    Pt postioned unlocked w/c to his L, his preference, before transfer.  Exrta time to lock brakes.  Squat pivot with close supervsion.  With set up of leg rests on the bed, pt able to atrach both with mod cues and extra time.  PT educated pt on donning shrinker sock higher on LE to ensure it stays snug on distal residual limb; pt did this without cues.  Educated pt on importance of avoiding L hip or knee flexion contractures in order to be successful with prosthesis.  W/c propulsion using bil UEs on level tile with supervision.  Strengthening using Kinetron from w/c level, RLE only at level 45 cn/sec plus some resistance from PT, x 25 cycles focusing on quadriceps muscles, x 25 cycles focusing on gluteal muscles.  Seated edge of mat, 15 x 1 L isolated knee extension with 5 second hold at end range. .  Supine: 15 x 2 each modified abdominal crunches, 15 x 1  R unilateral bridging, in R/L side lying 15 x 1  L/R hip abduction with flexed knees and hips  W/c> mat to R with supervision, 1 cue.  Gait training iwht RW on level tile x 30' with CGA, cues not to step past RW.  Pt left resting in w/c with needs at hand and seat alarm set.     Therapy Documentation Precautions:  Precautions Precautions: Fall Precaution Comments: impulsive Restrictions Weight Bearing Restrictions: Yes LLE Weight Bearing: Non weight bearing(BKA) Other Position/Activity Restrictions: L BKA  Pain:8/10 L residual limb; medicated during eval         Therapy/Group: Individual  Therapy  Jalynn Waddell 08/08/2018, 10:00 AM

## 2018-08-08 NOTE — Progress Notes (Signed)
Occupational Therapy Session Note  Patient Details  Name: John Sanfilippo Sr. MRN: [id number] Date of Birth: 1951/09/07  Today's Date: 08/08/2018 OT Individual Time: 8403-7543 OT Individual Time Calculation (min): 28 min    Short Term Goals: Week 1:  OT Short Term Goal 1 (Week 1): STG = LTGs due to ELOS  Skilled Therapeutic Interventions/Progress Updates:    Pt presents sitting up in w/c agreeable to therapy session. Pt propels w/c at modI level throughout session. Engaged in seated arm bike for UB strengthening/endurance x10 min, alternating between propelling forward/backward (68min each direction). Pt transitioned to therapy gym, use of 5lb and 6lb dowel rod for continued UB strengthening, performing bicep curls, chest press, shoulder raises, forward/backward rows x10reps each, taking rest breaks PRN. Pt returned to room in manner described above where he was left seated in w/c end of session, call bell and needs within reach.   Therapy Documentation Precautions:  Precautions Precautions: Fall Precaution Comments: impulsive Restrictions Weight Bearing Restrictions: Yes LLE Weight Bearing: Non weight bearing(BKA) Other Position/Activity Restrictions: L BKA     Therapy/Group: Individual Therapy  Raymondo Band 08/08/2018, 12:41 PM

## 2018-08-08 NOTE — Progress Notes (Signed)
[facility] PHYSICAL MEDICINE & REHABILITATION PROGRESS NOTE   Subjective/Complaints:  No new complaints.   ROS: Patient denies fever, rash, sore throat, blurred vision, nausea, vomiting, diarrhea, cough, shortness of breath or chest pain, joint or back pain, headache, or mood change    Objective:   No results found. Recent Labs    08/06/18 0500  WBC 8.6  HGB 9.4*  HCT 28.5*  PLT 482*   Recent Labs    08/06/18 0500  NA 133*  K 3.3*  CL 103  CO2 24  GLUCOSE 195*  BUN 6*  CREATININE 0.62  CALCIUM 8.1*    Intake/Output Summary (Last 24 hours) at 08/08/2018 1039 Last data filed at 08/08/2018 0833 Gross per 24 hour  Intake 662 ml  Output 1600 ml  Net -938 ml     Physical Exam: Vital Signs Blood pressure (!) 156/83, pulse 74, temperature 98.6 F (37 C), temperature source Oral, resp. rate 18, height 5\' 11"  (1.803 m), weight 63.6 kg, SpO2 100 %.   Constitutional: No distress . Vital signs reviewed. HEENT: EOMI, oral membranes moist Neck: supple Cardiovascular: RRR without murmur. No JVD    Respiratory: CTA Bilaterally without wheezes or rales. Normal effort    GI: BS +, non-tender, non-distended  Extremities: No clubbing, cyanosis, or edema Skin:  Left BK incision CDI, staples intact moderate edema--stable Neurologic: Cranial nerves II through XII intact, motor strength is 5/5 in bilateral deltoid, bicep, tricep, grip,RIght  hip flexor, knee extensors, ankle dorsiflexor and plantar flexor, 4/5 left HF Sensory exam normal sensation to light touch and pp in RIght  lower extremities Cerebellar exam normal finger to nose to finger as well as heel to shin in bilateral upper and lower extremities Musculoskeletal: Left BKA  No joint swelling, well formed   Assessment/Plan: 1. 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  Physiatrist is  providing close team supervision and 24 hour management of active medical problems listed below.  Physiatrist and rehab team continue to assess barriers to discharge/monitor patient progress toward functional and medical goals  Care Tool:  Bathing    Body parts bathed by patient: Right arm, Left arm, Chest, Abdomen, Front perineal area, Buttocks, Right upper leg, Left upper leg     Body parts n/a: Left lower leg(Lt BKA)   Bathing assist Assist Level: Contact Guard/Touching assist     Upper Body Dressing/Undressing Upper body dressing   What is the patient wearing?: Pull over shirt    Upper body assist Assist Level: Set up assist    Lower Body Dressing/Undressing Lower body dressing      What is the patient wearing?: Underwear/pull up, Pants     Lower body assist Assist for lower body dressing: Contact Guard/Touching assist     Toileting Toileting    Toileting assist Assist for toileting: Contact Guard/Touching assist     Transfers Chair/bed transfer  Transfers assist     Chair/bed transfer assist level: Supervision/Verbal cueing     Locomotion Ambulation   Ambulation assist      Assist level: Contact Guard/Touching assist Assistive device: Walker-rolling Max distance: 30   Walk 10 feet activity   Assist     Assist level: Contact Guard/Touching assist Assistive device: Walker-rolling   Walk 50 feet activity   Assist    Assist level: Minimal Assistance - Patient > 75% Assistive device: Walker-rolling    Walk 150  feet activity   Assist Walk 150 feet activity did not occur: Safety/medical concerns         Walk 10 feet on uneven surface  activity   Assist Walk 10 feet on uneven surfaces activity did not occur: Safety/medical concerns         Wheelchair     Assist Will patient use wheelchair at discharge?: Yes Type of Wheelchair: Manual    Wheelchair assist level: Independent Max wheelchair distance: 250     Wheelchair 50 feet with 2 turns activity    Assist        Assist Level: Independent   Wheelchair 150 feet activity     Assist     Assist Level: Independent      Medical Problem List and Plan: 1. 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  DVT Prophylaxis/Anticoagulation: Subcutaneous Lovenox. Monitor for any bleeding episodes 3. Pain Management:  Dilaudid 2 mg every four hours as needed  -well controlled 4. Mood:  Provide emotional support 5. Neuropsych: This patient is capable of making decisions on his own behalf. 6. Skin/Wound Care:  continue local care, shrinker  -prosthetics consult prior to discharge 7. Fluids/Electrolytes/Nutrition:  Routine and out's with follow-up chemistries 8. Acute blood loss anemia. Follow-up CBC 9. Diabetes mellitus with peripheral neuropathy. Hemoglobin A1c 7.4. Glucophage 500 mg twice a day. Check blood sugars before meals and at bedtime. Diabetic teaching CBG (last 3)  Recent Labs    08/07/18 1634 08/07/18 2137 08/08/18 0631  GLUCAP 126* 153* 126*  reasonable control. Continue with current plan 10. Hypertension. Norvasc 10 mg daily, Coreg 6.25 mg twice a day, hydralazine 50 mg every 8 hours- adequately controlled 11/13 Vitals:   08/07/18 2049 08/08/18 0539  BP: 132/70 (!) 156/83  Pulse: 68 74  Resp: 18 18  Temp: 99 F (37.2 C) 98.6 F (37 C)  SpO2: 100% 100%  11.Tobacco abuse. NicoDerm patch. Provide counseling 12. Hyperlipidemia. Lipitor 13.  Hypokalemia likely nutritional add KCL supplement LOS: 3 days A FACE TO FACE EVALUATION WAS PERFORMED  Meredith Staggers 08/08/2018, 10:39 AM

## 2018-08-08 NOTE — Progress Notes (Signed)
Occupational Therapy Session Note  Patient Details  Name: John Masterson Sr. MRN: [id number] Date of Birth: 05-Apr-1951  Today's Date: 08/08/2018 OT Individual Time: 1448-1530 OT Individual Time Calculation (min): 42 min    Short Term Goals: Week 1:  OT Short Term Goal 1 (Week 1): STG = LTGs due to ELOS  Skilled Therapeutic Interventions/Progress Updates:    Treatment session with focus on functional mobility and care for residual limb.  Pt received upright in w/c agreeable to treatment session.  Pt propelled w/c to therapy gym without assistance.  Engaged in sit > stand with RW with supervision.  Ambulated 69' and 15' with RW with contact guard.  Engaged in BLE exercises to include hip flexion/extension, hip abduction, knee extension in supine, sidelying and in prone.  Pt ambulated 75' with RW with contact guard then propelled w/c back to room.  Engaged in discussion regarding limb care with application of shrinker sock, pt reports donning it with nursing staff but would benefit from additional practice.  Discussed engaging in self-care tasks to include donning shrinker sock during therapy sessions tomorrow with pt in agreement.  Therapy Documentation Precautions:  Precautions Precautions: Fall Precaution Comments: impulsive Restrictions Weight Bearing Restrictions: Yes LLE Weight Bearing: Non weight bearing(BKA) Other Position/Activity Restrictions: L BKA Pain: Pain Assessment Pain Scale: 0-10 Pain Score: 0-No pain   Therapy/Group: Individual Therapy  Simonne Come 08/08/2018, 3:45 PM

## 2018-08-08 NOTE — Progress Notes (Signed)
Occupational Therapy Session Note  Patient Details  Name: John Nelis Sr. MRN: [id number] Date of Birth: 10/24/1950  Today's Date: 08/08/2018 OT Individual Time: 9323-5573 OT Individual Time Calculation (min): 42 min    Short Term Goals: Week 1:  OT Short Term Goal 1 (Week 1): STG = LTGs due to ELOS  Skilled Therapeutic Interventions/Progress Updates:    Treatment session with focus on ADL retraining and functional mobility.  Pt received upright in w/c agreeable to bathing this session.  Pt propelled w/c around room to gather clothing items in preparation for bathing.  Min cues to lock w/c brakes when reaching to obtain items in bag from floor.  Pt completed bathing at seated level with lateral leans when washing buttocks.  Pt requesting to shave.  Engaged in shaving face while seated at sink with setup for items.  Pt donned shirt without assistance.  Pt left seated at sink brushing hair at end of session.  Plan to complete LB dressing during next session as pt requesting to finish grooming tasks.  Therapy Documentation Precautions:  Precautions Precautions: Fall Precaution Comments: impulsive Restrictions Weight Bearing Restrictions: Yes LLE Weight Bearing: Non weight bearing(BKA) Other Position/Activity Restrictions: L BKA Pain:  Pt with no c/o pain   Therapy/Group: Individual Therapy  Simonne Come 08/08/2018, 12:26 PM

## 2018-08-09 ENCOUNTER — Inpatient Hospital Stay (HOSPITAL_COMMUNITY): Payer: Self-pay | Admitting: Physical Therapy

## 2018-08-09 ENCOUNTER — Inpatient Hospital Stay (HOSPITAL_COMMUNITY): Payer: Self-pay | Admitting: Occupational Therapy

## 2018-08-09 LAB — GLUCOSE, CAPILLARY
Glucose-Capillary: 172 mg/dL — ABNORMAL HIGH (ref 70–99)
Glucose-Capillary: 122 mg/dL — ABNORMAL HIGH (ref 70–99)
Glucose-Capillary: 165 mg/dL — ABNORMAL HIGH (ref 70–99)

## 2018-08-09 MED ORDER — HYDROMORPHONE HCL 2 MG PO TABS: 2.0000 mg | ORAL_TABLET | ORAL | 0 refills | Status: DC | PRN

## 2018-08-09 MED ORDER — CLOPIDOGREL BISULFATE 75 MG PO TABS: 75.0000 mg | ORAL_TABLET | Freq: Every day | ORAL | 1 refills | Status: DC

## 2018-08-09 MED ORDER — AMLODIPINE BESYLATE 10 MG PO TABS: 10.0000 mg | ORAL_TABLET | Freq: Every day | ORAL | 1 refills | Status: DC

## 2018-08-09 MED ORDER — NICOTINE 14 MG/24HR TD PT24: MEDICATED_PATCH | TRANSDERMAL | 0 refills | Status: DC

## 2018-08-09 MED ORDER — CARVEDILOL 6.25 MG PO TABS: 6.2500 mg | ORAL_TABLET | Freq: Two times a day (BID) | ORAL | 1 refills | Status: DC

## 2018-08-09 MED ORDER — ATORVASTATIN CALCIUM 20 MG PO TABS: 20.0000 mg | ORAL_TABLET | Freq: Every day | ORAL | 1 refills | Status: DC

## 2018-08-09 MED ORDER — METFORMIN HCL 500 MG PO TABS: 500.0000 mg | ORAL_TABLET | Freq: Two times a day (BID) | ORAL | 0 refills | Status: DC

## 2018-08-09 MED ORDER — HYDRALAZINE HCL 50 MG PO TABS: 50.0000 mg | ORAL_TABLET | Freq: Three times a day (TID) | ORAL | 1 refills | Status: DC

## 2018-08-09 NOTE — Progress Notes (Signed)
Occupational Therapy Session Note  Patient Details  Name: John Lage Sr. MRN: [id number] Date of Birth: [birth year]  Today's Date: 08/09/2018 OT Individual Time: 1445-1530 OT Individual Time Calculation (min): 45 min    Short Term Goals: Week 1:  OT Short Term Goal 1 (Week 1): STG = LTGs due to ELOS  Skilled Therapeutic Interventions/Progress Updates:    Treatment session with focus on functional transfers and mobility in home environment and BUE strengthening.  Pt received supine in bed agreeable to therapy session.  Pt completed bed mobility and transfer to w/c at Mod I level.  Pt propelled w/c to ADL apt without assistance.  Ambulated in to bathroom with RW and completed toilet transfer at Mod I level.  Encouraged pt to have supervision initially with toilet transfers if ambulating with RW, can complete transfers from w/c at Mod I level (pt reports bathroom is w/c accessible).  Engaged in w/c mobility throughout ADL apt and kitchen to obtain items from various cabinet heights and refrigerator.  Completed simulated meal prep at Mod I level with pt problem solving w/c positioning without cues.  Engaged in [place] with pt completing 5 mins forward and 5 mins backward for strengthening and endurance.  Pt returned to room as above and left seated upright in w/c with all needs in reach.  Therapy Documentation Precautions:  Precautions Precautions: Fall Precaution Comments: impulsive Restrictions Weight Bearing Restrictions: Yes LLE Weight Bearing: Non weight bearing(BKA) Other Position/Activity Restrictions: L BKA General:   Vital Signs: Therapy Vitals Temp: 98 F (36.7 C) Temp Source: Oral Pulse Rate: 67 BP: (!) 136/57 Patient Position (if appropriate): Sitting Oxygen Therapy SpO2: 100 % O2 Device: Room Air Pain:  Pt with no c/o pain   Therapy/Group: Individual Therapy  Simonne Come 08/09/2018, 4:16 PM

## 2018-08-09 NOTE — Progress Notes (Signed)
[facility] PHYSICAL MEDICINE & REHABILITATION PROGRESS NOTE   Subjective/Complaints:  No new issues. Anxious to get home  ROS: Patient denies fever, rash, sore throat, blurred vision, nausea, vomiting, diarrhea, cough, shortness of breath or chest pain, joint or back pain, headache, or mood change.   Objective:   No results found. No results for input(s): WBC, HGB, HCT, PLT in the last 72 hours. No results for input(s): NA, K, CL, CO2, GLUCOSE, BUN, CREATININE, CALCIUM in the last 72 hours.  Intake/Output Summary (Last 24 hours) at 08/09/2018 0941 Last data filed at 08/09/2018 0848 Gross per 24 hour  Intake 744 ml  Output 1750 ml  Net -1006 ml     Physical Exam: Vital Signs Blood pressure (!) 145/76, pulse 84, temperature 98.4 F (36.9 C), resp. rate 19, height 5\' 11"  (1.803 m), weight 63.8 kg, SpO2 100 %.   Constitutional: No distress . Vital signs reviewed. HEENT: EOMI, oral membranes moist Neck: supple Cardiovascular: RRR without murmur. No JVD    Respiratory: CTA Bilaterally without wheezes or rales. Normal effort    GI: BS +, non-tender, non-distended  Skin:  Left BK incision CDI with staples, wearing shrinker Neurologic: Cranial nerves II through XII intact, motor strength is 5/5 in bilateral deltoid, bicep, tricep, grip,RIght  hip flexor, knee extensors, ankle dorsiflexor and plantar flexor, 4/5 left HF Sensory exam normal sensation to light touch and pp in RIght  lower extremities Cerebellar exam normal finger to nose to finger as well as heel to shin in bilateral upper and lower extremities Musculoskeletal: left BK slightly swollen distally  Assessment/Plan: 1. 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  Physiatrist is providing close team supervision and 24 hour management of active medical problems listed below.  Physiatrist and  rehab team continue to assess barriers to discharge/monitor patient progress toward functional and medical goals  Care Tool:  Bathing    Body parts bathed by patient: Right arm, Left arm, Chest, Abdomen, Front perineal area, Buttocks, Right upper leg, Left upper leg, Face     Body parts n/a: Left lower leg(Lt BKA)   Bathing assist Assist Level: Supervision/Verbal cueing     Upper Body Dressing/Undressing Upper body dressing   What is the patient wearing?: Pull over shirt    Upper body assist Assist Level: Independent with assistive device    Lower Body Dressing/Undressing Lower body dressing      What is the patient wearing?: Underwear/pull up, Pants     Lower body assist Assist for lower body dressing: Contact Guard/Touching assist     Toileting Toileting    Toileting assist Assist for toileting: Contact Guard/Touching assist     Transfers Chair/bed transfer  Transfers assist     Chair/bed transfer assist level: Supervision/Verbal cueing     Locomotion Ambulation   Ambulation assist      Assist level: Contact Guard/Touching assist Assistive device: Walker-rolling Max distance: 30   Walk 10 feet activity   Assist     Assist level: Contact Guard/Touching assist Assistive device: Walker-rolling   Walk 50 feet activity   Assist    Assist level: Minimal Assistance - Patient > 75% Assistive device: Walker-rolling    Walk 150 feet activity   Assist Walk 150 feet activity did not occur: Safety/medical concerns         Walk 10 feet on uneven surface  activity   Assist Walk  10 feet on uneven surfaces activity did not occur: Safety/medical concerns         Wheelchair     Assist Will patient use wheelchair at discharge?: Yes Type of Wheelchair: Manual    Wheelchair assist level: Independent Max wheelchair distance: 250    Wheelchair 50 feet with 2 turns activity    Assist        Assist Level: Independent    Wheelchair 150 feet activity     Assist     Assist Level: Independent      Medical Problem List and Plan: 1. 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  DVT Prophylaxis/Anticoagulation: Subcutaneous Lovenox. Monitor for any bleeding episodes 3. Pain Management:  Dilaudid 2 mg every four hours as needed  -under control 4. Mood:  Provide emotional support 5. Neuropsych: This patient is capable of making decisions on his own behalf. 6. Skin/Wound Care:  continue local care, shrinker  -prosthetics consult prior to discharge 7. Fluids/Electrolytes/Nutrition:  Routine and out's with follow-up chemistries 8. Acute blood loss anemia. Follow-up CBC 9. Diabetes mellitus with peripheral neuropathy. Hemoglobin A1c 7.4. Glucophage 500 mg twice a day. Check blood sugars before meals and at bedtime. Diabetic teaching CBG (last 3)  Recent Labs    08/08/18 1254 08/08/18 1722 08/08/18 2131  GLUCAP 135* 96 113*  reasonable control.  No changes 10. Hypertension. Norvasc 10 mg daily, Coreg 6.25 mg twice a day, hydralazine 50 mg every 8 hours- adequately controlled 11/15 Vitals:   08/08/18 2126 08/09/18 0426  BP: 128/71 (!) 145/76  Pulse: 66 84  Resp: 18 19  Temp: 98.4 F (36.9 C) 98.4 F (36.9 C)  SpO2: 100% 100%  11.Tobacco abuse. NicoDerm patch. Provide counseling 12. Hyperlipidemia. Lipitor 13.  Hypokalemia likely nutritional add KCL supplement   LOS: 4 days A FACE TO FACE EVALUATION WAS PERFORMED  Meredith Staggers 08/09/2018, 9:41 AM

## 2018-08-09 NOTE — Progress Notes (Signed)
Social Work  Discharge Note  The overall goal for the admission was met for: DC SAT 11/16  Discharge location: Yes-HOME WITH WIFE AND DAUGHTER TO ASSIST-ALMOST 24 HR   Length of Stay: Yes-5 DAYS  Discharge activity level: Yes-MOD/I-SUPERVISION LEVEL  Home/community participation: Yes  Services provided included: MD, RD, PT, OT, RN, CM, TR, Pharmacy and SW  Financial Services: Other: PENDING MEDICAID AND APPLYING FOR MEDICARE  Follow-up services arranged: Home Health: [facility]-PT,OT,SW, DME: [facility]-WHEELCHAIR and Patient/Family has no preference for HH/DME agencies  Comments (or additional information):BETWEEN DAUGHTER AND WIFE CAN PROVIDE ALMOST 24 HR CARE. PT IS DOING WELL AND MOD/I WHEELCHAIR LEVEL. READY TO GO HOME. TO FOLLOW UP WITH [facility] IN [place] FOR PCP FOLLOW UP. MATCH GIVEN FOR ASSISTANCE WITH PRESCRIPTIONS.  Patient/Family verbalized understanding of follow-up arrangements: Yes  Individual responsible for coordination of the follow-up plan: KITTY-WIFE AND CLAY-DAUGHTER  Confirmed correct DME delivered: Elease Hashimoto 08/09/2018    Elease Hashimoto

## 2018-08-09 NOTE — Discharge Summary (Signed)
[name], [name] MEDICAL RECORD [id number] ACCOUNT [id number] DATE OF BIRTH:06-Jul-1951 FACILITY: [facility] LOCATION: [facility] PHYSICIAN:ANDREW Letta Pate, MD  DISCHARGE SUMMARY  DATE OF DISCHARGE:  08/10/2018  DATE OF ADMISSION:  08/05/2018  DATE OF DISCHARGE:  08/10/2018   DISCHARGE DIAGNOSES: 1.  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   2.  Subcutaneous Lovenox for deep venous thrombosis prophylaxis. 3.  Pain management. 4.  Acute blood loss anemia. 5.  Diabetes mellitus with peripheral neuropathy. 6.  Hypertension. 7.  Tobacco abuse. 8.  Hyperlipidemia.  HISTORY OF PRESENT ILLNESS:  This is a 67 year old right-handed male with history of diabetes mellitus, hypertension, tobacco abuse who lives with spouse, independent with assistive device prior to admission.  Presented 07/20/2018 of left foot pain x3  months with the tip of the 1st digit, left foot black and discolored.  X-rays and imaging revealed no fracture deformity, cystic lesion first metatarsal head.  Aortogram completed showing heavily calcified iliac arteries bilaterally and on the left side,  which was affected extremely.  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  Progressive ischemic changes and underwent transmetatarsal amputation, left foot 07/30/2018.  Continued nonhealing.  The limb was not felt to be salvageable.   Transtibial amputation completed 08/03/2018.  HOSPITAL COURSE:  Pain management.  Stump sock per [employer] prosthetics.  Subcutaneous Lovenox for DVT prophylaxis.  Acute blood loss anemia of 10.  He did have a small skin tear to left elbow after attempting to get out of bed without assistance.   Therapies completed and the patient was admitted for comprehensive rehabilitation program.  PAST MEDICAL HISTORY:  See discharge diagnoses.  SOCIAL HISTORY:  Lives with spouse.  Used assistive device prior to admission.  FUNCTIONAL STATUS:  Upon admission to rehab services was  minimal assist 15 feet rolling walker, minimal assist sit to stand, min mod assist with activities of daily living.  PHYSICAL EXAMINATION: VITAL SIGNS:  Blood pressure 132/72, pulse 83, temperature 98, respirations 18. GENERAL:  Alert male in no acute distress.  Oriented x3. HEENT:  EOMs intact.  Poor dentition. NECK:  Supple, nontender, no JVD. CARDIOVASCULAR:  Rate controlled. ABDOMEN:  Soft, nontender, good bowel sounds. LUNGS:  Clear to auscultation without wheeze.  Stump site appropriately tender, clean and dry.  REHABILITATION HOSPITAL COURSE:  The patient was admitted to inpatient rehabilitation services.  Therapies initiated on a 3-hour daily basis, consisting of physical therapy, occupational therapy and rehabilitation nursing.  The following issues were  addressed during patient's rehabilitation stay.  Pertaining to the patient's left transtibial amputation, surgical site dressed, appropriately tender.  No signs of infection.  Follow up with vascular surgery.  Subcutaneous Lovenox for DVT prophylaxis.   No bleeding episodes.  Pain control with the use of Dilaudid 2 mg every 4 hours as needed.  Blood sugars overall controlled.  Hemoglobin A1c of 7.4.  Glucophage as advised.  Blood pressures with Norvasc, Coreg, hydralazine adequately controlled.  The patient will be set up with PCP.  Tobacco abuse, Nicoderm patch.  The patient received full counseling with regards to cessation of nicotine products.  The patient received weekly collaborative interdisciplinary team conferences to discuss estimated  length of stay, family teaching, any barriers to discharge.  Ambulating rolling walker 30 feet contact guard.  Wheelchair to mat with supervision.  Energy conservation techniques ongoing.  Squat pivot transfers, close supervision.  Gathers belongings for  activities of daily living and homemaking.  Propelled his wheelchair modified independent.  The patient overall pleased with  his progress.  Plan  was discharge to home.  DISCHARGE MEDICATIONS:  Included Norvasc 10 mg p.o. daily, aspirin 81 mg p.o. daily, Lipitor 20 mg p.o. daily, Coreg 6.25 mg twice daily, Plavix 75 mg daily, hydralazine 50 mg q.8h., Glucophage 500 mg p.o. b.i.d., Nicoderm patch taper as directed,  Dilaudid 2 mg every 4 hours as needed for pain.    DIET:  His diet was a diabetic diet.  FOLLOWUP:  The patient will follow up with Dr. Alysia Penna at the outpatient rehab center as directed; Dr. Servando Snare of vascular surgery, call for appointment.  SPECIAL INSTRUCTIONS:  No driving, smoking or alcohol.  [facility].  TN/NUANCE D:08/09/2018 T:08/09/2018 JOB:[id number]

## 2018-08-09 NOTE — Discharge Summary (Signed)
Discharge summary job # [id number]

## 2018-08-09 NOTE — Progress Notes (Signed)
Physical Therapy Session Note  Patient Details  Name: Nicolaos Mitrano Sr. MRN: [id number] Date of Birth: Nov 26, 1950  Today's Date: 08/08/2018 PT Individual Time:  - 1800-1830   PT individual minutes: 30 min  Short Term Goals: Week 1:  PT Short Term Goal 1 (Week 1): STG=LTG due to ELOS  Skilled Therapeutic Interventions/Progress Updates: Pt received in w/c, denies pain and agreeable to treatment and participation in limb loss support group. Pt propelled w/c within hospital >1000' with BUE, occasional short rest breaks d/t fatigue, maneuvering on/off elevator, and engaged in direction finding task with cues for use of hospital signs for direction. Pt maneuvered w/c through dinner line, min cues for technique to self serve and where to place items on lap, in chair for ease of performance and independence. Pt actively participated in support group (unbilled time). Returned to rehab unit with w/c propulsion as above. Transfer to return to bed with close S; requires cues for removing leg rests and locking brakes prior to transfer for safety and reduced risk of falls. Remained in bed at end of session, alarm intact and all needs in reach.      Therapy Documentation Precautions:  Precautions Precautions: Fall Precaution Comments: impulsive Restrictions Weight Bearing Restrictions: Yes LLE Weight Bearing: Non weight bearing(BKA) Other Position/Activity Restrictions: L BKA    Therapy/Group: Individual Therapy  Corliss Skains 08/09/2018, 8:04 AM

## 2018-08-09 NOTE — Progress Notes (Signed)
Physical Therapy Discharge Summary  Patient Details  Name: John Yapp Sr. MRN: [id number] Date of Birth: 07/04/1951  Today's Date: 08/09/2018 PT Individual Time: 0900-0955 PT Individual Time Calculation (min): 55 min    Patient has met 9 of 9 long term goals due to improved activity tolerance, improved balance, improved postural control, increased strength, decreased pain and ability to compensate for deficits.  Patient to discharge at a wheelchair level Modified Independent. Pt demos good safety with bed mobility, transfers, and w/c mobility. Pt does require Supervision for short distance gait with RW. Education with patient about follow up care and encouraged attendance at amputee support group offered by the hospital. Patient's care partner is independent to provide the necessary Supervision as needed assistance at discharge.  Reasons goals not met: Pt has met all rehab goals.  Recommendation:  Patient will benefit from ongoing skilled PT services in home health setting to continue to advance safe functional mobility, address ongoing impairments in balance, LE strength, amputee education, and minimize fall risk.  Equipment: 16x16 manual w/c with amputee pad, RW  Reasons for discharge: treatment goals met and discharge from hospital  Patient/family agrees with progress made and goals achieved: Yes  PT Discharge Precautions/Restrictions Precautions Precautions: Fall Restrictions Weight Bearing Restrictions: Yes LLE Weight Bearing: Non weight bearing(BKA) Pain Pain Assessment Pain Scale: 0-10 Pain Score: 2  Pain Type: Acute pain Pain Location: Leg Pain Orientation: Left Pain Descriptors / Indicators: Aching Pain Frequency: Occasional Pain Onset: Gradual Patients Stated Pain Goal: 0 Pain Intervention(s): Medication (See eMAR);Repositioned Multiple Pain Sites: No Vision/Perception  Perception Perception: Within Functional Limits Praxis Praxis: Intact   Cognition Overall Cognitive Status: Within Functional Limits for tasks assessed Arousal/Alertness: Awake/alert Orientation Level: Oriented X4 Attention: Alternating Alternating Attention: Appears intact Memory: Appears intact Awareness: Appears intact Problem Solving: Appears intact Safety/Judgment: Appears intact Sensation Sensation Light Touch: Appears Intact Proprioception: Appears Intact Coordination Gross Motor Movements are Fluid and Coordinated: Yes Fine Motor Movements are Fluid and Coordinated: Yes Motor  Motor Motor: Within Functional Limits Motor - Discharge Observations: improved since eval  Mobility Bed Mobility Bed Mobility: Supine to Sit;Sit to Supine Supine to Sit: Independent Sit to Supine: Independent Transfers Transfers: Sit to Stand;Stand to Sit;Squat Pivot Transfers Sit to Stand: Independent with assistive device Stand to Sit: Independent with assistive device Stand Pivot Transfers: Independent with assistive device Squat Pivot Transfers: Independent with assistive device Transfer (Assistive device): Rolling walker Locomotion  Gait Ambulation: Yes Gait Assistance: Supervision/Verbal cueing Gait Distance (Feet): 50 Feet Assistive device: Rolling walker Gait Assistance Details: Verbal cues for precautions/safety Gait Gait: Yes Gait Pattern: Impaired Gait Pattern: ("hopping" due to L BKA) Gait velocity: decreased Stairs / Additional Locomotion Stairs: No Ramp: Supervision/Verbal Location manager Mobility: Yes Wheelchair Assistance: Independent with Camera operator: Both upper extremities Wheelchair Parts Management: Independent Distance: 150  Trunk/Postural Assessment  Cervical Assessment Cervical Assessment: Exceptions to WFL(forward head posture) Thoracic Assessment Thoracic Assessment: Within Functional Limits Lumbar Assessment Lumbar Assessment: Within Functional Limits Postural  Control Postural Control: Within Functional Limits  Balance Balance Balance Assessed: Yes Static Sitting Balance Static Sitting - Level of Assistance: 7: Independent Dynamic Sitting Balance Dynamic Sitting - Level of Assistance: 7: Independent Static Standing Balance Static Standing - Level of Assistance: 6: Modified independent (Device/Increase time) Dynamic Standing Balance Dynamic Standing - Level of Assistance: 6: Modified independent (Device/Increase time) Extremity Assessment   RLE Assessment RLE Assessment: Within Functional Limits General Strength Comments: 5/5 grossly LLE Assessment LLE Assessment: Exceptions  to [facility] Passive Range of Motion (PROM) Comments: WFL at the hip and knee, L BKA General Strength Comments: 5/5 hip flex, deferred knee 2/2 BKA    Excell Seltzer, PT, DPT  08/09/2018, 12:10 PM

## 2018-08-09 NOTE — Progress Notes (Signed)
Occupational Therapy Session Note  Patient Details  Name: [name]. MRN: [id number] Date of Birth: [birth year]  Today's Date: 08/09/2018 OT Individual Time: 1100-1154 OT Individual Time Calculation (min): 54 min    Short Term Goals: Week 1:  OT Short Term Goal 1 (Week 1): STG = LTGs due to ELOS  Skilled Therapeutic Interventions/Progress Updates:    Completed ADL retraining at overall Mod I level.  Pt received upright in w/c agreeable to engaging in self-care tasks.  Pt reports need to toilet.  Completed stand pivot transfers w/c <> toilet at Mod I level.  Pt reports w/c will fit in bathroom at home and pt plans to utilize w/c majority of time.  Encouraged pt to have supervision when ambulating to bathroom, with pt in agreement.  Pt completed clothing management in standing and hygiene with lateral leans on toilet.  Bathing completed seated at sink, washing buttocks with lateral leans.  Pt completed LB dressing at sit > stand level, alternating UE support on sink.  Pt able to remove and don shrinker sock, utilized inspection mirror to inspect residual limb.  Therapist educated on what to look for during his inspection and to notify MD immediately if he finds anything out of the ordinary, pt reports understanding.  Pt left upright in w/c with lunch tray and all needs in reach.  Therapy Documentation Precautions:  Precautions Precautions: Fall Precaution Comments: impulsive Restrictions Weight Bearing Restrictions: Yes LLE Weight Bearing: Non weight bearing(BKA) Other Position/Activity Restrictions: L BKA Pain: Pain Assessment Pain Score: 2  ADL: ADL Eating: Independent Where Assessed-Eating: Wheelchair Grooming: Independent Where Assessed-Grooming: Wheelchair Upper Body Bathing: Modified independent Where Assessed-Upper Body Bathing: Wheelchair, Sitting at sink Lower Body Bathing: Modified independent Where Assessed-Lower Body Bathing: Wheelchair, Sitting at sink, Standing  at sink Upper Body Dressing: Modified independent (Device) Where Assessed-Upper Body Dressing: Wheelchair, Sitting at sink Lower Body Dressing: Modified independent Where Assessed-Lower Body Dressing: Wheelchair, Sitting at sink, Standing at sink Toileting: Modified independent Where Assessed-Toileting: Glass blower/designer: Modified Programmer, applications Method: Arts development officer: Grab bars   Therapy/Group: Individual Therapy  Simonne Come 08/09/2018, 12:15 PM

## 2018-08-09 NOTE — Progress Notes (Signed)
Occupational Therapy Session Note  Patient Details  Name: [name] Sr. MRN: [id number] Date of Birth: 1951-08-16  Today's Date: 08/09/2018 OT Individual Time: 0865-7846 OT Individual Time Calculation (min): 28 min   Short Term Goals: Week 1:  OT Short Term Goal 1 (Week 1): STG = LTGs due to ELOS  Skilled Therapeutic Interventions/Progress Updates:    Pt greeted in w/c with no c/o pain. Agreeable to tx and wanted to work on UE strengthening. He self propelled to gym, maneuvering around several obstacles in hallway without assist. Pt then engaged in boxing activity while seated, with pt able to sustain activity for 3 minute intervals before requiring rest. Afterwards he self propelled back to room and was left with all needs within reach.    Therapy Documentation Precautions:  Precautions Precautions: Fall Precaution Comments: impulsive Restrictions Weight Bearing Restrictions: Yes LLE Weight Bearing: Non weight bearing(BKA) Other Position/Activity Restrictions: L BKA ADL: ADL Eating: Independent Where Assessed-Eating: Wheelchair Grooming: Independent Where Assessed-Grooming: Wheelchair Upper Body Bathing: Modified independent Where Assessed-Upper Body Bathing: Wheelchair, Sitting at sink Lower Body Bathing: Modified independent Where Assessed-Lower Body Bathing: Wheelchair, Sitting at sink, Standing at sink Upper Body Dressing: Modified independent (Device) Where Assessed-Upper Body Dressing: Wheelchair, Sitting at sink Lower Body Dressing: Modified independent Where Assessed-Lower Body Dressing: Wheelchair, Sitting at sink, Standing at sink Toileting: Modified independent Where Assessed-Toileting: Glass blower/designer: Modified Programmer, applications Method: Arts development officer: Grab bars Vision Baseline Vision/History: Wears glasses Wears Glasses: At all times Patient Visual Report: No change from baseline(due for new glasses) Vision  Assessment?: No apparent visual deficits Perception  Perception: Within Functional Limits Praxis Praxis: Intact     Therapy/Group: Individual Therapy  Clista Rainford A Yunique Dearcos 08/09/2018, 4:19 PM

## 2018-08-09 NOTE — Progress Notes (Signed)
Occupational Therapy Discharge Summary  Patient Details  Name: John Nemetz Sr. MRN: [id number] Date of Birth: 1950/11/23  Patient has met 8 of 8 long term goals due to improved activity tolerance, improved balance, ability to compensate for deficits and improved pain management.  Patient to discharge at overall Modified Independent level.  Patient's care partner is independent to provide the necessary intermittent assistance at discharge.    Reasons goals not met: N/A  Recommendation:  Patient will benefit from ongoing skilled OT services in home health setting to continue to advance functional skills in the area of BADL, iADL and Reduce care partner burden.  Equipment: w/c with amputee pad.  Pt already received RW and 3 in1 from acute hospital  Reasons for discharge: treatment goals met and discharge from hospital  Patient/family agrees with progress made and goals achieved: Yes  OT Discharge Precautions/Restrictions  Precautions Precautions: Fall Restrictions Weight Bearing Restrictions: Yes LLE Weight Bearing: Non weight bearing(BKA) Pain Pain Assessment Pain Score: 2  ADL ADL Eating: Independent Where Assessed-Eating: Wheelchair Grooming: Independent Where Assessed-Grooming: Wheelchair Upper Body Bathing: Modified independent Where Assessed-Upper Body Bathing: Wheelchair, Sitting at sink Lower Body Bathing: Modified independent Where Assessed-Lower Body Bathing: Wheelchair, Sitting at sink, Standing at sink Upper Body Dressing: Modified independent (Device) Where Assessed-Upper Body Dressing: Wheelchair, Sitting at sink Lower Body Dressing: Modified independent Where Assessed-Lower Body Dressing: Wheelchair, Sitting at sink, Standing at sink Toileting: Modified independent Where Assessed-Toileting: Glass blower/designer: Modified Programmer, applications Method: Arts development officer: Grab bars Vision Baseline Vision/History: Wears  glasses Wears Glasses: At all times Patient Visual Report: No change from baseline(due for new glasses) Vision Assessment?: No apparent visual deficits Perception  Perception: Within Functional Limits Praxis Praxis: Intact Cognition Overall Cognitive Status: Within Functional Limits for tasks assessed Arousal/Alertness: Awake/alert Orientation Level: Oriented X4 Attention: Alternating Alternating Attention: Appears intact Memory: Appears intact Awareness: Appears intact Problem Solving: Appears intact Safety/Judgment: Appears intact Sensation Sensation Light Touch: Appears Intact Proprioception: Appears Intact Coordination Gross Motor Movements are Fluid and Coordinated: Yes Fine Motor Movements are Fluid and Coordinated: Yes Motor  Motor Motor: Within Functional Limits Motor - Discharge Observations: improved since eval Mobility  Bed Mobility Bed Mobility: Supine to Sit;Sit to Supine Supine to Sit: Independent Sit to Supine: Independent Transfers Sit to Stand: Independent with assistive device Stand to Sit: Independent with assistive device  Trunk/Postural Assessment  Cervical Assessment Cervical Assessment: Exceptions to WFL(forward head posture) Thoracic Assessment Thoracic Assessment: Within Functional Limits Lumbar Assessment Lumbar Assessment: Within Functional Limits Postural Control Postural Control: Within Functional Limits  Balance Balance Balance Assessed: Yes Static Sitting Balance Static Sitting - Level of Assistance: 7: Independent Dynamic Sitting Balance Dynamic Sitting - Level of Assistance: 7: Independent Static Standing Balance Static Standing - Level of Assistance: 6: Modified independent (Device/Increase time) Dynamic Standing Balance Dynamic Standing - Level of Assistance: 6: Modified independent (Device/Increase time) Extremity/Trunk Assessment RUE Assessment RUE Assessment: Within Functional Limits LUE Assessment LUE Assessment:  Within Functional Limits   Cleveland Yarbro 08/09/2018, 12:21 PM

## 2018-08-09 NOTE — Discharge Instructions (Signed)
Inpatient Rehab Discharge Instructions  John Monts Sr. Discharge date and time: No discharge date for patient encounter.   Activities/Precautions/ Functional Status: Activity: activity as tolerated Diet: diabetic diet Wound Care: keep wound clean and dry Functional status:  ___ No restrictions     ___ Walk up steps independently ___ 24/7 supervision/assistance   ___ Walk up steps with assistance ___ Intermittent supervision/assistance  ___ Bathe/dress independently ___ Walk with walker     _x__ Bathe/dress with assistance ___ Walk Independently    ___ Shower independently ___ Walk with assistance    ___ Shower with assistance ___ No alcohol     ___ Return to work/school ________  Special Instructions:  No driving, smoking, or alcohol    COMMUNITY REFERRALS UPON DISCHARGE:    Home Health:   PT, OT, SW    Agency:[facility] [place]   Date of last service:08/10/2018   Medical Equipment/Items Ordered:WHEELCHAIR-GOT ROLLING WALKER AND BEDSIDE COMMODE FORM ACUTE AND SENT HOME  Agency/Supplier:[facility]   [phone] Other:SABRINA HODGES-FINANCIAL COUNSELOR [phone]  WORKING ON MEDICAID PT AND WIFE TO GO TO SOCIAL SECURITY OFFICE TO APPLY FOR MEDICARE MATCH GIVEN TO ASSIST WITH PRESCRIPTIONS UNTIL CAN GET INTO THE CLINIC  GENERAL COMMUNITY RESOURCES FOR PATIENT/FAMILY: Support [place]  My questions have been answered and I understand these instructions. I will adhere to these goals and the provided educational materials after my discharge from the hospital.  Patient/Caregiver Signature _______________________________ Date __________  Clinician Signature _______________________________________ Date __________  Please bring this form and your medication list with you to all your follow-up doctor's appointments.

## 2018-08-10 DIAGNOSIS — S88112A Complete traumatic amputation at level between knee and ankle, left lower leg, initial encounter: Secondary | ICD-10-CM

## 2018-08-10 DIAGNOSIS — I10 Essential (primary) hypertension: Secondary | ICD-10-CM

## 2018-08-10 DIAGNOSIS — D62 Acute posthemorrhagic anemia: Secondary | ICD-10-CM

## 2018-08-10 DIAGNOSIS — E1142 Type 2 diabetes mellitus with diabetic polyneuropathy: Secondary | ICD-10-CM

## 2018-08-10 LAB — GLUCOSE, CAPILLARY: Glucose-Capillary: 174 mg/dL — ABNORMAL HIGH (ref 70–99)

## 2018-08-10 NOTE — Plan of Care (Signed)
Patient discharge by staff via W/C;  denies pain at this time. voices understanding of discharge instructions.

## 2018-08-10 NOTE — Progress Notes (Signed)
[facility] PHYSICAL MEDICINE & REHABILITATION PROGRESS NOTE   Subjective/Complaints: Patient seen laying in bed this morning.  He is ready for discharge.  He slept well overnight.  ROS: Denies CP, SOB, N/V/D  Objective:   No results found. No results for input(s): WBC, HGB, HCT, PLT in the last 72 hours. No results for input(s): NA, K, CL, CO2, GLUCOSE, BUN, CREATININE, CALCIUM in the last 72 hours.  Intake/Output Summary (Last 24 hours) at 08/10/2018 1558 Last data filed at 08/10/2018 0653 Gross per 24 hour  Intake 480 ml  Output 2025 ml  Net -1545 ml     Physical Exam: Vital Signs Blood pressure 101/77, pulse 75, temperature 98 F (36.7 C), temperature source Oral, resp. rate 17, height 5\' 11"  (1.803 m), weight 61.5 kg, SpO2 99 %.   Constitutional: No distress . Vital signs reviewed. HENT: Normocephalic.  Atraumatic. Eyes: EOMI. No discharge. Cardiovascular: RRR. No JVD. Respiratory: CTA Bilaterally. Normal effort. GI: BS +. Non-distended. Musc: No edema or tenderness in extremities. Neurologic: Grossly 5/5 bilateral upper extremities, right lower extremity, left hip flexion Psych: Normal mood.  Normal affect.  Assessment/Plan: 1. 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  Physiatrist is providing close team supervision and 24 hour management of active medical problems listed below.  Physiatrist and rehab team continue to assess barriers to discharge/monitor patient progress toward functional and medical goals  Care Tool:  Bathing    Body parts bathed by patient: Right arm, Left arm, Chest, Abdomen, Front perineal area, Buttocks, Right upper leg, Left upper leg, Face, Right lower leg     Body parts n/a: Left lower leg   Bathing assist Assist Level: Independent with assistive device     Upper Body Dressing/Undressing Upper body  dressing   What is the patient wearing?: Pull over shirt    Upper body assist Assist Level: Independent with assistive device    Lower Body Dressing/Undressing Lower body dressing      What is the patient wearing?: Underwear/pull up, Pants     Lower body assist Assist for lower body dressing: Independent with assitive device     Toileting Toileting    Toileting assist Assist for toileting: Independent with assistive device     Transfers Chair/bed transfer  Transfers assist     Chair/bed transfer assist level: Independent with assistive device     Locomotion Ambulation   Ambulation assist      Assist level: Supervision/Verbal cueing Assistive device: Walker-rolling Max distance: 50'   Walk 10 feet activity   Assist     Assist level: Supervision/Verbal cueing Assistive device: Walker-rolling   Walk 50 feet activity   Assist    Assist level: Supervision/Verbal cueing Assistive device: Walker-rolling    Walk 150 feet activity   Assist Walk 150 feet activity did not occur: Safety/medical concerns         Walk 10 feet on uneven surface  activity   Assist Walk 10 feet on uneven surfaces activity did not occur: Safety/medical concerns         Wheelchair     Assist Will patient use wheelchair at discharge?: Yes Type of Wheelchair: Manual    Wheelchair assist level: Independent Max wheelchair distance: 250'    Wheelchair 50 feet with 2 turns activity    Assist        Assist Level: Independent   Wheelchair 150 feet activity  Assist     Assist Level: Independent      Medical Problem List and Plan: 1. 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 2.  DVT Prophylaxis/Anticoagulation: Subcutaneous Lovenox. Monitor for any bleeding episodes 3. Pain Management:  Dilaudid 2 mg every  four hours as needed  -Controlled 4. Mood:  Provide emotional support 5. Neuropsych: This patient is capable of making decisions on his own behalf. 6. Skin/Wound Care:  continue local care, shrinker 7. Fluids/Electrolytes/Nutrition:  Routine in and out's  8. Acute blood loss anemia.   Hemoglobin 9.4 on 11/12 9. Diabetes mellitus with peripheral neuropathy. Hemoglobin A1c 7.4. Glucophage 500 mg twice a day. Check blood sugars before meals and at bedtime. Diabetic teaching CBG (last 3)  Recent Labs    08/09/18 1630 08/09/18 2101 08/10/18 0652  GLUCAP 122* 165* 174*   Elevated, will need ambulatory adjustments 10. Hypertension. Norvasc 10 mg daily, Coreg 6.25 mg twice a day, hydralazine 50 mg every 8 hours-  Vitals:   08/09/18 1947 08/10/18 0425  BP: 120/77 101/77  Pulse: 60 75  Resp: 19 17  Temp: 98.2 F (36.8 C) 98 F (36.7 C)  SpO2: 100% 99%  11.Tobacco abuse. NicoDerm patch. Provide counseling 12. Hyperlipidemia. Lipitor 13.  Hypokalemia likely nutritional add KCL supplement   LOS: 5 days A FACE TO FACE EVALUATION WAS PERFORMED  Dhaval Woo Lorie Phenix 08/10/2018, 3:58 PM

## 2018-08-13 ENCOUNTER — Telehealth: Payer: Self-pay

## 2018-08-13 NOTE — Telephone Encounter (Addendum)
Transitional Care call-who you talked with No answer, left message at [phone] for patient to call back, no DPR on file. I called [phone] number and person answered said it was not patients number. No answer, left message at [phone] again.   1. Are you/is patient experiencing any problems since coming home? Are there any questions regarding any aspect of care? 2. Are there any questions regarding medications administration/dosing? Are meds being taken as prescribed? Patient should review meds with caller to confirm 3. Have there been any falls? 4. Has Home Health been to the house and/or have they contacted you? If not, have you tried to contact them? Can we help you contact them? 5. Are bowels and bladder emptying properly? Are there any unexpected incontinence issues? If applicable, is patient following bowel/bladder programs? 6. Any fevers, problems with breathing, unexpected pain? 7. Are there any skin problems or new areas of breakdown? 8. Has the patient/family member arranged specialty MD follow up (ie cardiology/neurology/renal/surgical/etc)?  Can we help arrange? 9. Does the patient need any other services or support that we can help arrange? 10. Are caregivers following through as expected in assisting the patient? 11. Has the patient quit smoking, drinking alcohol, or using drugs as recommended?  Appointment time, arrive time 1:20 pm for 1:40 pm with Zella Ball then back to see Dr. Letta Pate [address]

## 2018-08-15 NOTE — Telephone Encounter (Signed)
Pt never called and never could reach. He will be a hospital f/u.

## 2018-08-19 ENCOUNTER — Encounter: Payer: Medicaid Other | Attending: Registered Nurse | Admitting: Registered Nurse

## 2018-08-19 ENCOUNTER — Encounter: Payer: Self-pay | Admitting: Registered Nurse

## 2018-08-19 VITALS — BP 118/73 | HR 79 | Ht 71.0 in | Wt 136.0 lb

## 2018-08-19 DIAGNOSIS — E785 Hyperlipidemia, unspecified: Secondary | ICD-10-CM | POA: Diagnosis not present

## 2018-08-19 DIAGNOSIS — E1142 Type 2 diabetes mellitus with diabetic polyneuropathy: Secondary | ICD-10-CM | POA: Diagnosis not present

## 2018-08-19 DIAGNOSIS — Z89512 Acquired absence of left leg below knee: Secondary | ICD-10-CM | POA: Diagnosis not present

## 2018-08-19 DIAGNOSIS — F17210 Nicotine dependence, cigarettes, uncomplicated: Secondary | ICD-10-CM | POA: Diagnosis not present

## 2018-08-19 DIAGNOSIS — S88112A Complete traumatic amputation at level between knee and ankle, left lower leg, initial encounter: Secondary | ICD-10-CM

## 2018-08-19 DIAGNOSIS — I10 Essential (primary) hypertension: Secondary | ICD-10-CM | POA: Insufficient documentation

## 2018-08-19 DIAGNOSIS — Z720 Tobacco use: Secondary | ICD-10-CM

## 2018-08-19 NOTE — Progress Notes (Signed)
Subjective:    Patient ID: John Laurence Sr., male    DOB: [birth year], 67 y.o.   MRN: [id number]  HPI: [name]. is a 67 y.o. male who is here for hospital follow up appointment for his pain Left BKA, Type 2 DM with peripheral neuropathy, HTN, dyslipidemia and Tobacco Abuse. He has been home [facility] [name] states he's walking with his walker only with the therapist. He denies any pain.   Pain Inventory Average Pain 6 Pain Right Now 3 My pain is stabbing and tingling  In the last 24 hours, has pain interfered with the following? General activity 3 Relation with others 2 Enjoyment of life 0 What TIME of day is your pain at its worst? na Sleep (in general) Good  Pain is worse with: some activites Pain improves with: na Relief from Meds: na  Mobility walk without assistance ability to climb steps?  yes do you drive?  no  Function retired  Neuro/Psych trouble walking  Prior Studies Any changes since last visit?  no  Physicians involved in your care Any changes since last visit?  no   Family History  Problem Relation Age of Onset  . Cancer Sister    Social History   Socioeconomic History  . Marital status: Married    Spouse name: Not on file  . Number of children: Not on file  . Years of education: Not on file  . Highest education level: Not on file  Occupational History  . Not on file  Social Needs  . Financial resource strain: Not on file  . Food insecurity:    Worry: Not on file    Inability: Not on file  . Transportation needs:    Medical: Not on file    Non-medical: Not on file  Tobacco Use  . Smoking status: Current Every Day Smoker    Packs/day: 0.50    Types: Cigarettes  . Smokeless tobacco: Former Systems developer    Types: Snuff    Quit date: 04/21/1989  Substance and Sexual Activity  . Alcohol use: Yes    Alcohol/week: 12.0 standard drinks    Types: 12 Cans of beer per week  . Drug use: No    . Sexual activity: Not Currently  Lifestyle  . Physical activity:    Days per week: Not on file    Minutes per session: Not on file  . Stress: Not on file  Relationships  . Social connections:    Talks on phone: Not on file    Gets together: Not on file    Attends religious service: Not on file    Active member of club or organization: Not on file    Attends meetings of clubs or organizations: Not on file    Relationship status: Not on file  Other Topics Concern  . Not on file  Social History Narrative  . Not on file   Past Surgical History:  Procedure Laterality Date  . AMPUTATION Left 08/02/2018   Procedure: LEFT AMPUTATION BELOW KNEE;  Surgeon: Marty Heck, MD;  Location: [facility];  Service: Vascular;  Laterality: Left;  . ELBOW SURGERY     Bone graft to repair elbow  . LOWER EXTREMITY ANGIOGRAPHY N/A 07/24/2018   Procedure: LOWER EXTREMITY ANGIOGRAPHY;  Surgeon: Marty Heck, MD;  Location: [facility];  Service: Cardiovascular;  Laterality: N/A;  . PERIPHERAL VASCULAR INTERVENTION  07/24/2018   Procedure: PERIPHERAL VASCULAR INTERVENTION;  Surgeon: Marty Heck, MD;  Location: [facility];  Service: Cardiovascular;;  . TONSILLECTOMY    . TRANSMETATARSAL AMPUTATION Left 07/25/2018   Procedure: LEFT GREAT TOE AMPUTATION;  Surgeon: Marty Heck, MD;  Location: [facility];  Service: Vascular;  Laterality: Left;  . TRANSMETATARSAL AMPUTATION Left 07/30/2018   Procedure: TRANSMETATARSAL AMPUTATION;  Surgeon: Waynetta Sandy, MD;  Location: [facility];  Service: Vascular;  Laterality: Left;   Past Medical History:  Diagnosis Date  . Arthritis   . Diabetes mellitus without complication ([place])   . Emesis   . Heart murmur   . Hyperglycemia   . Hypertension   . Joint pain    Ht 5\' 11"  (1.803 m)   Wt 136 lb (61.7 kg)   BMI 18.97 kg/m   Opioid Risk Score:   Fall Risk Score:  `1  Depression screen PHQ 2/9  No flowsheet data  found.   Review of Systems  Constitutional: Positive for appetite change.  HENT: Negative.   Eyes: Negative.   Respiratory: Negative.   Cardiovascular: Negative.   Gastrointestinal: Positive for diarrhea.  Endocrine: Negative.   Genitourinary: Negative.   Musculoskeletal: Positive for gait problem.  Skin: Negative.   Allergic/Immunologic: Negative.   Hematological: Negative.   Psychiatric/Behavioral: Negative.   All other systems reviewed and are negative.      Objective:   Physical Exam  Constitutional: He is oriented to person, place, and time. He appears well-developed and well-nourished.  HENT:  Head: Normocephalic and atraumatic.  Neck: Normal range of motion. Neck supple.  Cardiovascular: Normal rate and regular rhythm.  Pulmonary/Chest: Effort normal and breath sounds normal.  Musculoskeletal:  Normal Muscle Bulk and Muscle Testing Reveals:  Upper Extremities: Full ROM and Muscle Strength 5/5 Lower Extremities Right: Full ROM and Muscle Strength 5/5 Left: BKA: Staples OTA no Drainage Arrived in wheelchair  Neurological: He is alert and oriented to person, place, and time.  Skin: Skin is warm and dry.  Psychiatric: He has a normal mood and affect. His behavior is normal.  Nursing note and vitals reviewed.         Assessment & Plan:  1. Left BKA: Spoke  With [name] to schedule a f/u appointment with Dr Donzetta Matters.  Continue with Home Health Therapy. 2. Type 2 DM with Peripheral Neuropathy: No complaints today. Continue to Monitor.  3. HTN: Continue current medication regimen.  4. Dyslipidemia: Continue current medication regimen. 5. Tobacco Abuse: Educated on smoking Cessation: He verbalizes understanding.   20 minutes of face to face patient care time was spent during this visit. All questions was encouraged and answered.  F/U with Dr Letta Pate in 4- 6 weeks.

## 2018-08-30 ENCOUNTER — Ambulatory Visit (INDEPENDENT_AMBULATORY_CARE_PROVIDER_SITE_OTHER): Payer: Self-pay | Admitting: Vascular Surgery

## 2018-08-30 ENCOUNTER — Other Ambulatory Visit: Payer: Self-pay

## 2018-08-30 ENCOUNTER — Encounter: Payer: Self-pay | Admitting: Vascular Surgery

## 2018-08-30 VITALS — BP 114/63 | HR 63 | Temp 98.0°F | Resp 20 | Ht 71.0 in | Wt 136.0 lb

## 2018-08-30 DIAGNOSIS — I739 Peripheral vascular disease, unspecified: Secondary | ICD-10-CM

## 2018-08-30 NOTE — Progress Notes (Signed)
    Subjective:     Patient ID: John Laurence Sr., male   DOB: 1951/07/13, 67 y.o.   MRN: [id number]  HPI 67 year old male follows up after left below-knee amputation.  States that it is healing well.  No complaints today.  Has never had a stroke TIA or amaurosis.  Right foot not causing him any issues.   Review of Systems No complaints today    Objective:   Physical Exam Awake alert oriented Nonlabored respirations Bilateral carotid bruits Regular rate and rhythm Palpable left popliteal pulse Left low knee amputation well-healed staples removed    Assessment:     67 year old male follows up after left below-knee amputation which is healing well staples were removed today.  Wife has been in contact with prosthetics team he should be okay to start that process at this time.    Plan:     He will follow-up in 3 to 4 months with carotid duplexes bilaterally given known stenoses have not been evaluated in a few years he also appears to have bruits bilaterally.  He will get an ABI of the right lower extremity which was previously normal.  Marvens Hollars C. Donzetta Matters, MD [facility] Office: [phone] Pager: [phone]

## 2018-09-27 ENCOUNTER — Encounter: Payer: Medicare Other | Attending: Registered Nurse

## 2018-09-27 ENCOUNTER — Ambulatory Visit (HOSPITAL_BASED_OUTPATIENT_CLINIC_OR_DEPARTMENT_OTHER): Payer: Medicare Other | Admitting: Physical Medicine & Rehabilitation

## 2018-09-27 ENCOUNTER — Encounter: Payer: Self-pay | Admitting: Physical Medicine & Rehabilitation

## 2018-09-27 VITALS — BP 138/76 | HR 67 | Resp 14 | Ht 71.0 in | Wt 136.0 lb

## 2018-09-27 DIAGNOSIS — Z89512 Acquired absence of left leg below knee: Secondary | ICD-10-CM

## 2018-09-27 DIAGNOSIS — E1142 Type 2 diabetes mellitus with diabetic polyneuropathy: Secondary | ICD-10-CM | POA: Insufficient documentation

## 2018-09-27 DIAGNOSIS — I10 Essential (primary) hypertension: Secondary | ICD-10-CM | POA: Diagnosis not present

## 2018-09-27 DIAGNOSIS — E785 Hyperlipidemia, unspecified: Secondary | ICD-10-CM | POA: Diagnosis not present

## 2018-09-27 DIAGNOSIS — F17210 Nicotine dependence, cigarettes, uncomplicated: Secondary | ICD-10-CM | POA: Insufficient documentation

## 2018-09-27 MED ORDER — HYDRALAZINE HCL 50 MG PO TABS: 50.0000 mg | ORAL_TABLET | Freq: Three times a day (TID) | ORAL | 1 refills | Status: DC

## 2018-09-27 MED ORDER — CARVEDILOL 6.25 MG PO TABS: 6.2500 mg | ORAL_TABLET | Freq: Two times a day (BID) | ORAL | 1 refills | Status: DC

## 2018-09-27 MED ORDER — ATORVASTATIN CALCIUM 20 MG PO TABS: 20.0000 mg | ORAL_TABLET | Freq: Every day | ORAL | 1 refills | Status: DC

## 2018-09-27 MED ORDER — AMLODIPINE BESYLATE 10 MG PO TABS: 10.0000 mg | ORAL_TABLET | Freq: Every day | ORAL | 1 refills | Status: DC

## 2018-09-27 MED ORDER — CLOPIDOGREL BISULFATE 75 MG PO TABS: 75.0000 mg | ORAL_TABLET | Freq: Every day | ORAL | 1 refills | Status: DC

## 2018-09-27 MED ORDER — METFORMIN HCL 500 MG PO TABS: 500.0000 mg | ORAL_TABLET | Freq: Two times a day (BID) | ORAL | 0 refills | Status: DC

## 2018-09-27 NOTE — Progress Notes (Signed)
Subjective:    Patient ID: John Laurence Sr., male    DOB: 1951-05-23, 68 y.o.   MRN: [id number]  HPI  Right shoulder pain, slowly improved, no hx of falls, was "messin round" with son in law and got his arm twisting  Getting fit for prosthesis  Able to dress and bathe self  Can't stand up straight  Can do transfers to toilet bed and chair and in car   Pain Inventory Average Pain 2 Pain Right Now 1 My pain is tingling  In the last 24 hours, has pain interfered with the following? General activity 0 Relation with others 0 Enjoyment of life 1 What TIME of day is your pain at its worst? morning Sleep (in general) Good  Pain is worse with: unsure Pain improves with: rest Relief from Meds: 8  Mobility walk with assistance ability to climb steps?  no do you drive?  no use a wheelchair transfers alone  Function retired I need assistance with the following:  shopping  Neuro/Psych bladder control problems tingling trouble walking  Prior Studies Any changes since last visit?  no  Physicians involved in your care Any changes since last visit?  no   Family History  Problem Relation Age of Onset  . Cancer Sister    Social History   Socioeconomic History  . Marital status: Married    Spouse name: Not on file  . Number of children: Not on file  . Years of education: Not on file  . Highest education level: Not on file  Occupational History  . Not on file  Social Needs  . Financial resource strain: Not on file  . Food insecurity:    Worry: Not on file    Inability: Not on file  . Transportation needs:    Medical: Not on file    Non-medical: Not on file  Tobacco Use  . Smoking status: Current Every Day Smoker    Packs/day: 0.50    Types: Cigarettes  . Smokeless tobacco: Former Systems developer    Types: Snuff    Quit date: 04/21/1989  Substance and Sexual Activity  . Alcohol use: Yes    Alcohol/week: 12.0 standard drinks    Types: 12 Cans of beer per week    . Drug use: No  . Sexual activity: Not Currently  Lifestyle  . Physical activity:    Days per week: Not on file    Minutes per session: Not on file  . Stress: Not on file  Relationships  . Social connections:    Talks on phone: Not on file    Gets together: Not on file    Attends religious service: Not on file    Active member of club or organization: Not on file    Attends meetings of clubs or organizations: Not on file    Relationship status: Not on file  Other Topics Concern  . Not on file  Social History Narrative  . Not on file   Past Surgical History:  Procedure Laterality Date  . AMPUTATION Left 08/02/2018   Procedure: LEFT AMPUTATION BELOW KNEE;  Surgeon: Marty Heck, MD;  Location: [facility];  Service: Vascular;  Laterality: Left;  . ELBOW SURGERY     Bone graft to repair elbow  . LOWER EXTREMITY ANGIOGRAPHY N/A 07/24/2018   Procedure: LOWER EXTREMITY ANGIOGRAPHY;  Surgeon: Marty Heck, MD;  Location: [facility];  Service: Cardiovascular;  Laterality: N/A;  . PERIPHERAL VASCULAR INTERVENTION  07/24/2018  Procedure: PERIPHERAL VASCULAR INTERVENTION;  Surgeon: Marty Heck, MD;  Location: [facility];  Service: Cardiovascular;;  . TONSILLECTOMY    . TRANSMETATARSAL AMPUTATION Left 07/25/2018   Procedure: LEFT GREAT TOE AMPUTATION;  Surgeon: Marty Heck, MD;  Location: [facility];  Service: Vascular;  Laterality: Left;  . TRANSMETATARSAL AMPUTATION Left 07/30/2018   Procedure: TRANSMETATARSAL AMPUTATION;  Surgeon: Waynetta Sandy, MD;  Location: [facility];  Service: Vascular;  Laterality: Left;   Past Medical History:  Diagnosis Date  . Arthritis   . Diabetes mellitus without complication ([place])   . Emesis   . Heart murmur   . Hyperglycemia   . Hypertension   . Joint pain    BP 138/76   Pulse 67   Resp 14   Ht 5\' 11"  (1.803 m) Comment: previously  Wt 136 lb (61.7 kg) Comment: previous  SpO2 98%   BMI 18.97 kg/m    Opioid Risk Score:   Fall Risk Score:  `1  Depression screen PHQ 2/9  No flowsheet data found.  Review of Systems  Constitutional: Positive for appetite change.  HENT: Negative.   Eyes: Negative.   Respiratory: Negative.   Cardiovascular: Negative.   Gastrointestinal: Negative.   Endocrine:       High blood sugar  Genitourinary: Negative.   Musculoskeletal: Positive for gait problem.  Skin: Negative.   Allergic/Immunologic: Negative.   Neurological:       Tingling  Hematological: Negative.   Psychiatric/Behavioral: Negative.   All other systems reviewed and are negative.      Objective:   Physical Exam Vitals signs and nursing note reviewed.  Constitutional:      Appearance: Normal appearance.  HENT:     Head: Normocephalic.     Mouth/Throat:     Mouth: Mucous membranes are moist.  Eyes:     Extraocular Movements: Extraocular movements intact.     Pupils: Pupils are equal, round, and reactive to light.  Skin:    General: Skin is warm and dry.     Comments: BKA incision well healed minimal edema  Neurological:     General: No focal deficit present.     Mental Status: He is alert and oriented to person, place, and time.  Psychiatric:        Mood and Affect: Mood normal.        Behavior: Behavior normal.             Assessment & Plan:  1.  Left BKA looks ready for fitting.  Pain is well controlled Will see prothetist next week.  Has f/u with vascular surgery  2.  HTN and DM, still has not established with PCP .  Pt's wife sees Dr Delphina Cahill in [place] will make referral Will cont current meds for now

## 2018-09-27 NOTE — Patient Instructions (Addendum)
I recommend saving your limited therapy visits until after you receive your prothesis  Dr Donzetta Matters can write those orders  I refilled your medicine one more time, I am a specialist , not a family doctor  Please contact your wife's MD Dr Nevada Crane to see if he take you on as a new patient

## 2018-11-14 ENCOUNTER — Telehealth: Payer: Self-pay | Admitting: *Deleted

## 2018-11-14 NOTE — Telephone Encounter (Signed)
Stacy, PT, [facility] left a message asking for verbal orders for HHPT 2week3.  Medical record reviewed. Social work note reviewed.  Verbal orders given per office protocol.

## 2018-11-22 DIAGNOSIS — Z44122 Encounter for fitting and adjustment of partial artificial left leg: Secondary | ICD-10-CM | POA: Diagnosis not present

## 2018-11-22 DIAGNOSIS — I10 Essential (primary) hypertension: Secondary | ICD-10-CM

## 2018-11-22 DIAGNOSIS — E1142 Type 2 diabetes mellitus with diabetic polyneuropathy: Secondary | ICD-10-CM | POA: Diagnosis not present

## 2018-11-22 DIAGNOSIS — Z89512 Acquired absence of left leg below knee: Secondary | ICD-10-CM | POA: Diagnosis not present

## 2018-11-22 DIAGNOSIS — Z7984 Long term (current) use of oral hypoglycemic drugs: Secondary | ICD-10-CM

## 2018-11-22 DIAGNOSIS — E1151 Type 2 diabetes mellitus with diabetic peripheral angiopathy without gangrene: Secondary | ICD-10-CM | POA: Diagnosis not present

## 2018-11-22 DIAGNOSIS — F17210 Nicotine dependence, cigarettes, uncomplicated: Secondary | ICD-10-CM

## 2018-11-27 ENCOUNTER — Other Ambulatory Visit: Payer: Self-pay | Admitting: Physical Medicine & Rehabilitation

## 2018-12-30 ENCOUNTER — Encounter (HOSPITAL_COMMUNITY): Payer: Self-pay

## 2018-12-30 ENCOUNTER — Ambulatory Visit: Payer: Self-pay | Admitting: Family

## 2019-02-19 ENCOUNTER — Telehealth: Payer: Self-pay

## 2019-02-19 ENCOUNTER — Other Ambulatory Visit: Payer: Self-pay | Admitting: Physical Medicine & Rehabilitation

## 2019-02-19 NOTE — Telephone Encounter (Signed)
Pts wife called and said that he needed refills on his medications.   Called and advised her that we did not fill any medications for this patient and told her the name of the physicians that are prescribing.   York Cerise, CMA

## 2019-04-04 ENCOUNTER — Other Ambulatory Visit: Payer: Self-pay

## 2019-04-04 DIAGNOSIS — I739 Peripheral vascular disease, unspecified: Secondary | ICD-10-CM

## 2019-04-11 ENCOUNTER — Other Ambulatory Visit: Payer: Self-pay

## 2019-04-11 ENCOUNTER — Ambulatory Visit (HOSPITAL_COMMUNITY)
Admission: RE | Admit: 2019-04-11 | Discharge: 2019-04-11 | Disposition: A | Discharge status: Home / Self Care | Payer: Medicare Other | Source: Ambulatory Visit | Attending: Family | Admitting: Family

## 2019-04-11 ENCOUNTER — Ambulatory Visit (INDEPENDENT_AMBULATORY_CARE_PROVIDER_SITE_OTHER): Payer: Medicare Other | Admitting: Family

## 2019-04-11 ENCOUNTER — Encounter: Payer: Self-pay | Admitting: Family

## 2019-04-11 ENCOUNTER — Ambulatory Visit (INDEPENDENT_AMBULATORY_CARE_PROVIDER_SITE_OTHER)
Admission: RE | Admit: 2019-04-11 | Discharge: 2019-04-11 | Disposition: A | Discharge status: Home / Self Care | Payer: Medicare Other | Source: Ambulatory Visit | Attending: Family | Admitting: Family

## 2019-04-11 VITALS — BP 129/78 | HR 66 | Temp 97.7°F | Resp 12 | Ht 71.0 in | Wt 151.0 lb

## 2019-04-11 DIAGNOSIS — Z89512 Acquired absence of left leg below knee: Secondary | ICD-10-CM

## 2019-04-11 DIAGNOSIS — I739 Peripheral vascular disease, unspecified: Secondary | ICD-10-CM

## 2019-04-11 DIAGNOSIS — F17200 Nicotine dependence, unspecified, uncomplicated: Secondary | ICD-10-CM

## 2019-04-11 DIAGNOSIS — I779 Disorder of arteries and arterioles, unspecified: Secondary | ICD-10-CM | POA: Diagnosis not present

## 2019-04-11 NOTE — Patient Instructions (Signed)

## 2019-04-11 NOTE — Progress Notes (Signed)
[facility]   CC: Follow up peripheral artery occlusive disease  History of Present Illness John Molstad Sr. is a 68 y.o. 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  He denies any claudication type symptoms in his right leg with walking. He is walking well with a cane, and wearing his left BKA prosthesis.  Since a piece of bone was removed from his right hip (in the 1990's) to use to repair his left arm fracture, his right hip has mild pain with walking.   He denies any history of stroke or TIA.   He has no PCP, the prescription meds he takes now he states were prescribed by the hospital from his left BKA.  He denies any problems with his left BKA stump.    Diabetic: Yes, he has no PCP, takes no DM meds, series of glucose results in file in the mid 100's Tobacco use: smoker  (1/2 ppd, started in his teens)  Pt meds include: Statin :Yes Betablocker: No ASA: Yes Other anticoagulants/antiplatelets: Plavix  Past Medical History:  Diagnosis Date  . Arthritis   . Diabetes mellitus without complication ([place])   . Emesis   . Heart murmur   . Hyperglycemia   . Hypertension   . Joint pain     Social History Social History   Tobacco Use  . Smoking status: Current Every Day Smoker    Packs/day: 0.50    Types: Cigarettes  . Smokeless tobacco: Former Systems developer    Types: Snuff    Quit date: 04/21/1989  Substance Use Topics  . Alcohol use: Yes    Alcohol/week: 12.0 standard drinks    Types: 12 Cans of beer per week  . Drug use: No    Family History Family History  Problem Relation Age of Onset  . Cancer Sister     Past Surgical History:  Procedure Laterality Date  . AMPUTATION Left 08/02/2018   Procedure: LEFT AMPUTATION BELOW KNEE;  Surgeon: Marty Heck, MD;  Location: [facility];  Service: Vascular;  Laterality: Left;  . ELBOW SURGERY     Bone graft to repair elbow  . LOWER EXTREMITY ANGIOGRAPHY N/A 07/24/2018   Procedure: LOWER EXTREMITY ANGIOGRAPHY;  Surgeon: Marty Heck, MD;  Location: [facility];  Service: Cardiovascular;  Laterality: N/A;  . PERIPHERAL VASCULAR INTERVENTION  07/24/2018   Procedure: PERIPHERAL VASCULAR INTERVENTION;  Surgeon: Marty Heck, MD;  Location: [facility];  Service: Cardiovascular;;  . TONSILLECTOMY    . TRANSMETATARSAL AMPUTATION Left 07/25/2018   Procedure: LEFT GREAT TOE AMPUTATION;  Surgeon: Marty Heck, MD;  Location: [facility];  Service: Vascular;  Laterality: Left;  . TRANSMETATARSAL AMPUTATION Left 07/30/2018   Procedure: TRANSMETATARSAL AMPUTATION;  Surgeon: Waynetta Sandy, MD;  Location: [facility];  Service: Vascular;  Laterality: Left;    Allergies  Allergen Reactions  . Tea Itching and Rash    Current Outpatient Medications  Medication Sig Dispense Refill  . acetaminophen (TYLENOL) 325 MG tablet Take 2 tablets (650 mg total) by mouth every 6 (six) hours as needed for mild pain, moderate pain or fever. 30 tablet 0  . amLODipine (NORVASC) 10 MG tablet Take 1 tablet (10 mg total) by mouth daily. 30 tablet 1  . aspirin EC 81 MG EC tablet Take 1 tablet (81 mg total) by mouth daily. 30 tablet 0  .  atorvastatin (LIPITOR) 20 MG tablet Take 1 tablet (20 mg total) by mouth daily at 6 PM. 30 tablet 1  . carvedilol (COREG) 6.25 MG tablet Take 1 tablet (6.25 mg total) by mouth 2 (two) times daily with a meal. 60 tablet 1  . clopidogrel (PLAVIX) 75 MG tablet Take 1 tablet (75 mg total) by mouth daily with breakfast. 30 tablet 1  . hydrALAZINE (APRESOLINE) 50 MG tablet Take 1 tablet (50 mg total) by mouth every 8 (eight) hours. 30 tablet 1  . metFORMIN (GLUCOPHAGE) 500 MG tablet Take 1 tablet (500 mg total) by mouth 2 (two) times daily with a meal. 60 tablet 0  . nicotine (NICODERM CQ - DOSED IN MG/24 HOURS) 14 mg/24hr patch 14 mg patch daily 2 weeks  then 7 mg patch daily 3 weeks and stop 28 patch 0   No current facility-administered medications for this visit.     ROS: See HPI for pertinent positives and negatives.   Physical Examination  Vitals:   04/11/19 1038  BP: 129/78  Pulse: 66  Resp: 12  Temp: 97.7 F (36.5 C)  TempSrc: Temporal  SpO2: 98%  Weight: 151 lb (68.5 kg)  Height: 5\' 11"  (1.803 m)   Body mass index is 21.06 kg/m.  General: A&O x 3, WDWN, male. Gait: limp, using a cane, wearing left BKA prosthesis  HENT: No gross abnormalities.  Eyes: PERRLA. Pulmonary: Respirations are non labored, CTAB, good air movement in all fields Cardiac: regular rhythm, no detected murmur.         Carotid Bruits Right Left   Negative Negative   Radial pulses are 2+ right, 1+ left palpable  Adominal aortic pulse is not palpable                         VASCULAR EXAM: Extremities without ischemic changes, without Gangrene; without open wounds. Left BKA with prosthesis in place.                                                                                                          LE Pulses Right Left       FEMORAL  2+ palpable  2+ palpable        POPLITEAL  not palpable   not palpable       POSTERIOR TIBIAL  not palpable   BKA        DORSALIS PEDIS      ANTERIOR TIBIAL not palpable  BKA    Abdomen: soft, NT, no palpable masses. Skin: no rashes, no cellulitis, no ulcers noted. Musculoskeletal: no muscle wasting or atrophy.  Neurologic: A&O X 3; appropriate affect, Sensation is normal; MOTOR FUNCTION:  moving all extremities equally, motor strength 5/5 throughout. Speech is fluent/normal. CN 2-12 intact. Psychiatric: Thought content is normal, mood appropriate for clinical situation.    ASSESSMENT: John Farrington Sr. is a 68 y.o. 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  He denies any claudication type  symptoms in his right leg with walking. There are no signs of ischemia in his right lower extremity.   He is walking well with a cane, and wearing his left BKA prosthesis.  He denies any problems with his left BKA stump.  Right ABI today is normal with biphasic waveforms; right TBI is normal.    Since a piece of bone was removed from his right hip (in the 1990's) to use to repair his left arm fracture, his right hip has mild pain with walking.   He has no PCP, the prescription meds he takes now he states were prescribed by the hospital from his left BKA.  He has what appears to be uncontrolled DM, takes no medication for DM other than metformin.  Will refer him to the [facility] since he needs a PCP.   His atherosclerotic risk factors include DM and current smoker since his teens. Fortunately he has a normal BMI.   He has no history of stroke or MI. Carotid duplex today demonstrates 1-39% bilateral ICA stenosis. Will not need to repeat carotid duplex until about 3 years.    DATA  Carotid Duplex (04-11-19): Right Carotid: Velocities in the right ICA are consistent with a 1-39% stenosis. Left Carotid: Velocities in the left ICA are consistent with a 1-39% stenosis.               Non-hemodynamically significant plaque noted in the CCA. Vertebrals:  Right vertebral artery demonstrates antegrade flow.              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 Subclavians: Left subclavian artery flow was disturbed. Normal flow hemodynamics              were seen in the right subclavian artery.   ABI (Date: 04/11/2019): ABI Findings: +---------+------------------+-----+--------+--------+ Right    Rt Pressure (mmHg)IndexWaveformComment  +---------+------------------+-----+--------+--------+ Brachial 151                                     +---------+------------------+-----+--------+--------+ PTA       145               0.96 biphasic         +---------+------------------+-----+--------+--------+ DP       163               1.08 biphasic         +---------+------------------+-----+--------+--------+ Great Toe109               0.72                  +---------+------------------+-----+--------+--------+  +--------+------------------+-----+--------+-------+ Left    Lt Pressure (mmHg)IndexWaveformComment +--------+------------------+-----+--------+-------+ [id number]                                    +--------+------------------+-----+--------+-------+  +-------+-----------+-----------+------------+------------+ ABI/TBIToday's ABIToday's TBIPrevious ABIPrevious TBI +-------+-----------+-----------+------------+------------+ Right  1.08       0.72       0.94        0.34         +-------+-----------+-----------+------------+------------+  Summary: Right: Resting right ankle-brachial index is within normal range. No evidence of significant right lower extremity arterial disease. The right toe-brachial index  is normal. Indices may be overestimated due to calcified vessels.   PLAN:  Based on the patient's vascular studies and examination, pt will return to clinic in 6 months with right ABI. I advised him to notify us if he develops concerns re the circulation in his right foot or leg, or wound on his left BKA stump.   Over 3 minutes was spent counseling patient re smoking cessation, and patient was given several free resources re smoking cessation.  Referral to [facility] for PCP, he has no PCP.   I discussed in depth with the patient the nature of atherosclerosis, and emphasized the importance of maximal medical management including strict control of blood pressure, blood glucose, and lipid levels, obtaining regular exercise, and cessation of smoking.  The patient is aware that without maximal medical management the  underlying atherosclerotic disease process will progress, limiting the benefit of any interventions.  The patient was given information about PAD including signs, symptoms, treatment, what symptoms should prompt the patient to seek immediate medical care, and risk reduction measures to take.  Clemon Chambers, RN, MSN, FNP-C [facility] Phone: [phone]  Clinic MD: Donzetta Matters  04/11/19 11:42 AM

## 2019-08-14 ENCOUNTER — Other Ambulatory Visit: Payer: Self-pay

## 2019-08-14 DIAGNOSIS — Z20822 Contact with and (suspected) exposure to covid-19: Secondary | ICD-10-CM

## 2019-08-14 NOTE — Addendum Note (Signed)
Addended by: Lynn Ito on: 08/14/2019 03:01 PM   Modules accepted: Orders

## 2019-08-16 LAB — NOVEL CORONAVIRUS, NAA: SARS-CoV-2, NAA: NOT DETECTED

## 2020-02-04 IMAGING — CR DG FOOT COMPLETE 3+V*L*
1 series · 3 of 3 positions shown · non-contrast
Comparison: None.

CLINICAL DATA: LEFT foot discoloration.  History of diabetes.

EXAM:
LEFT FOOT - COMPLETE 3+ VIEW; LEFT TIBIA AND FIBULA - 2 VIEW

[Series 1: ap · 0.17mm/px · 3 of 3 slices shown]
[im 1/3]
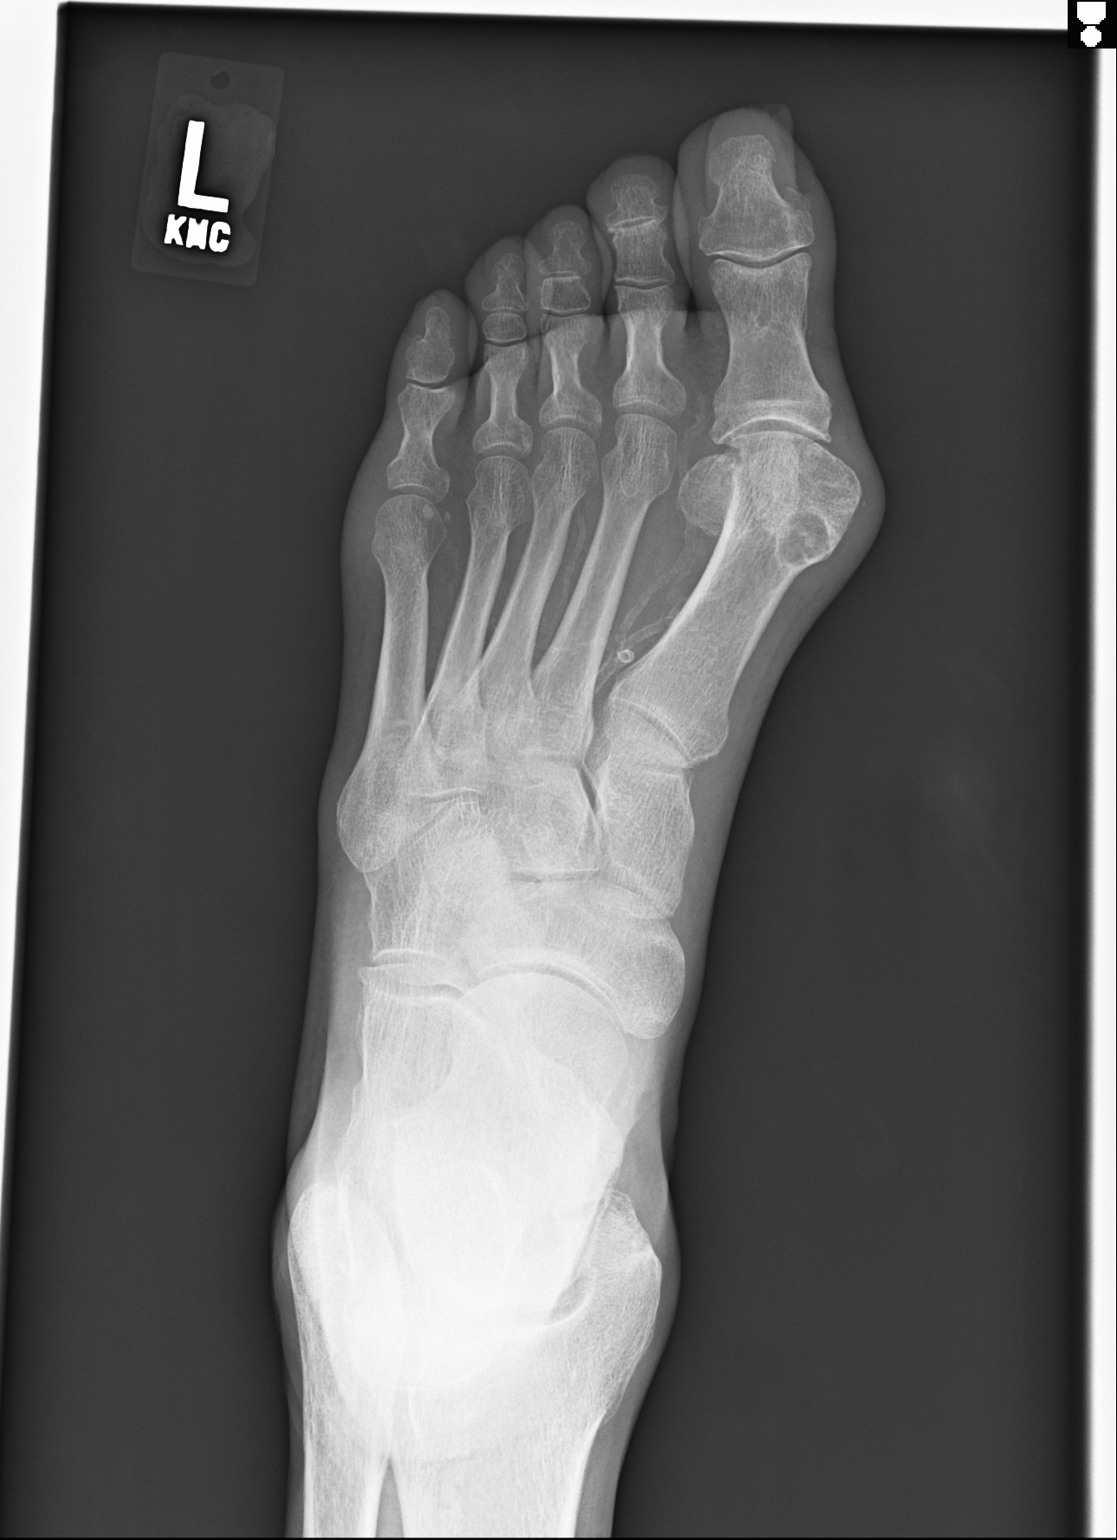
[im 2/3]
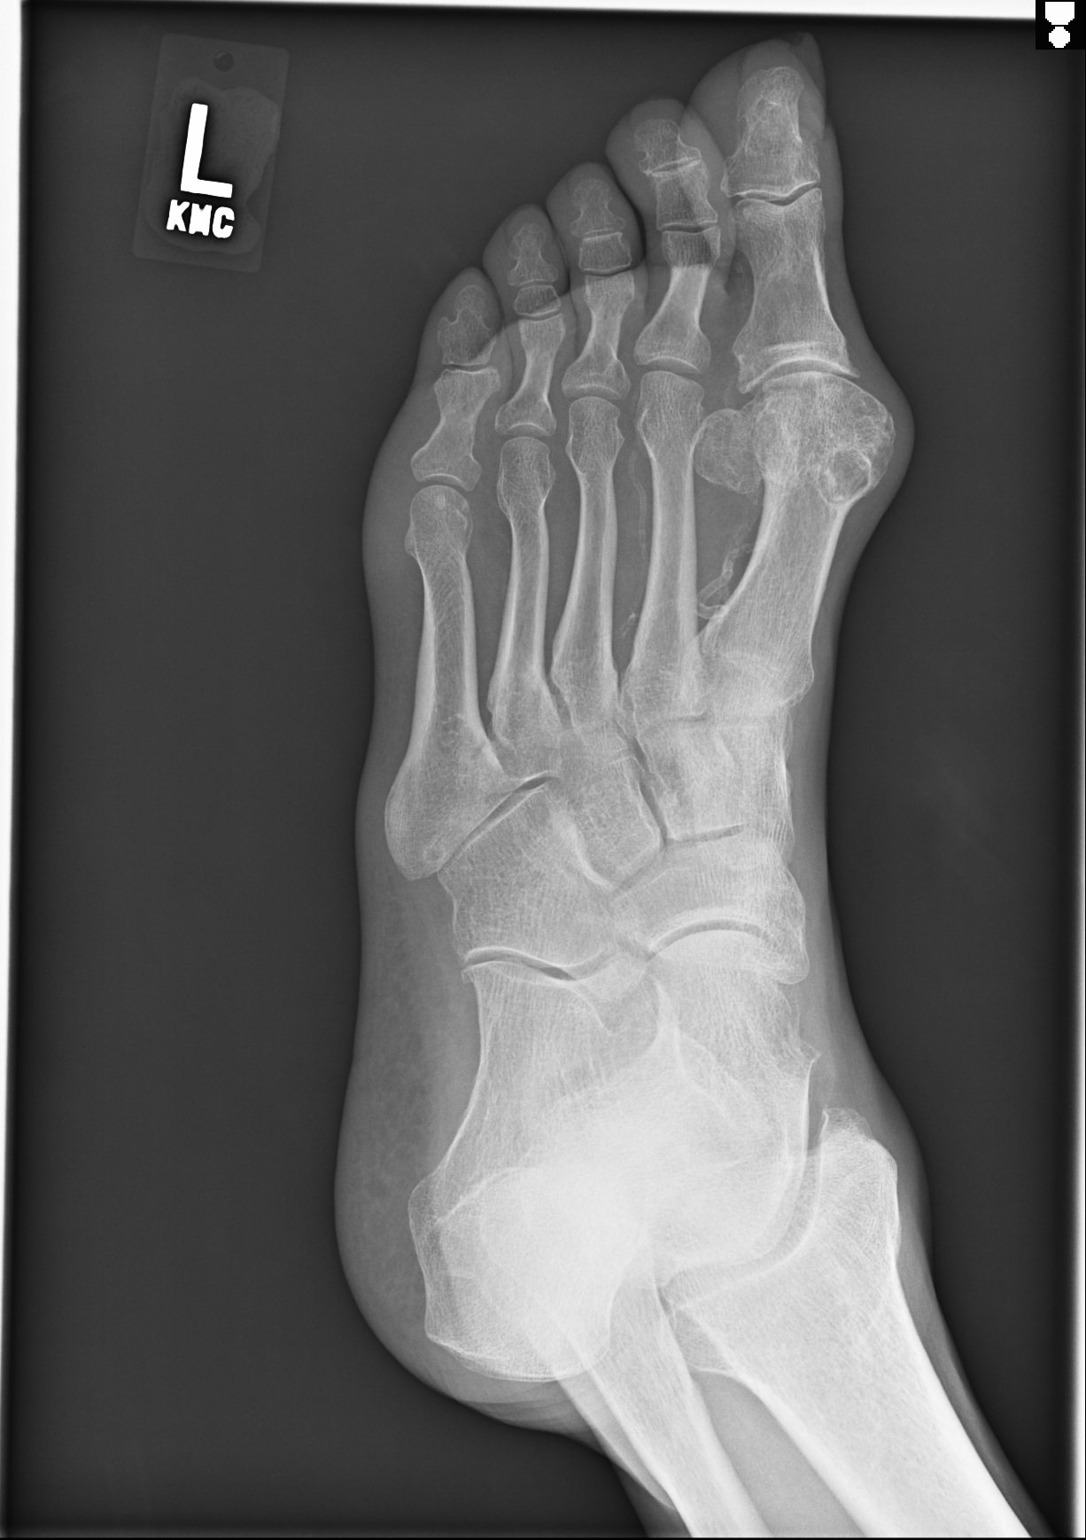
[im 3/3]
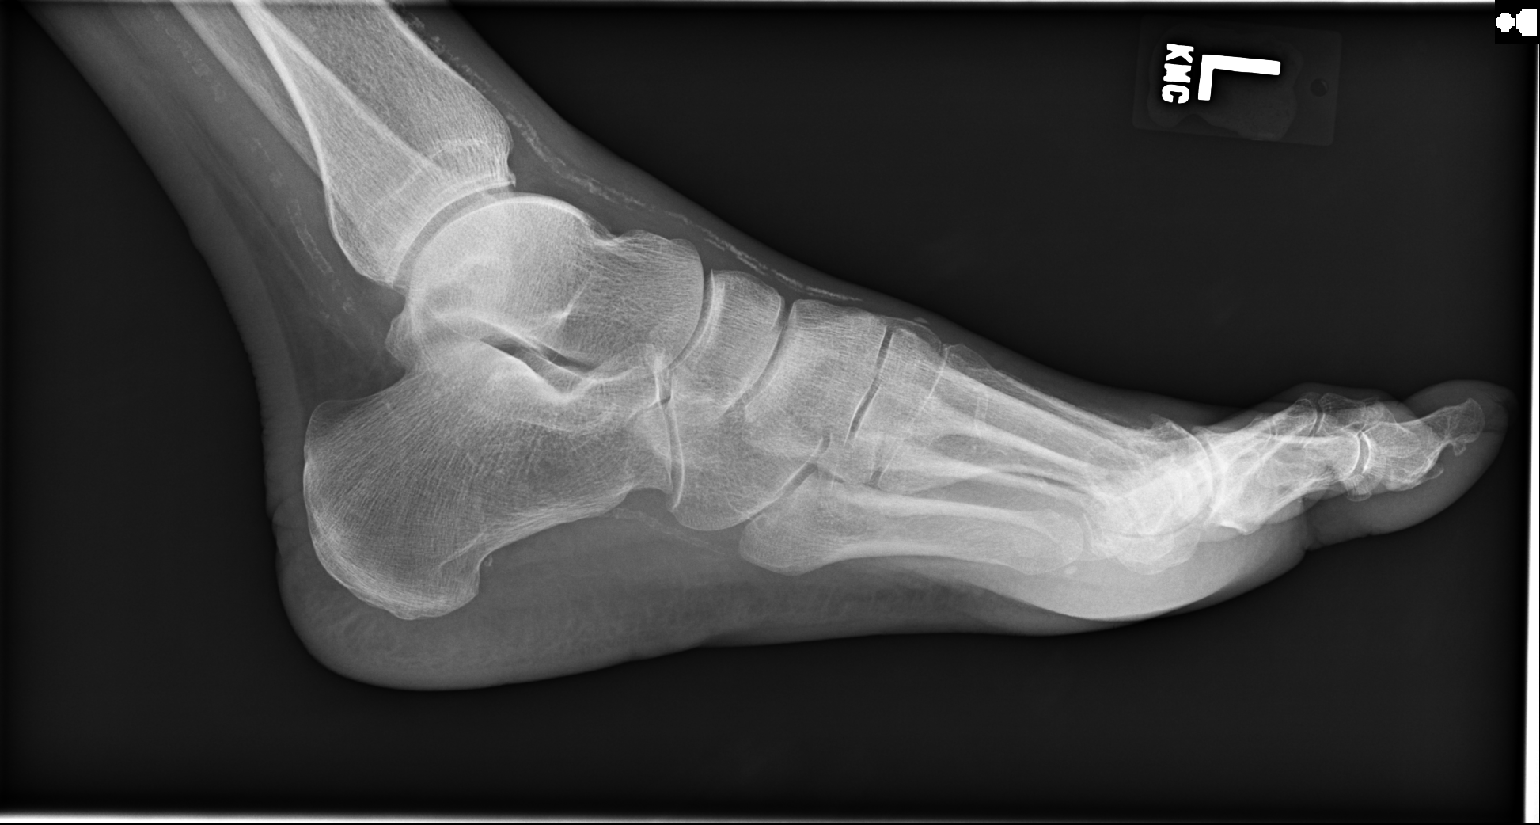

[3 of 3 positions shown; findings below may reference images not displayed]

FINDINGS: LEFT tibia and fibula: No destructive bony lesions. Mild
degenerative changes the included knee with faint periarticular
calcifications. Moderate vascular calcifications. No destructive
bony lesions. Soft tissue planes are non suspicious.

LEFT foot: No fracture deformity or dislocation. Hallux valgus with
moderate first MTP osteoarthrosis, lateral subluxation first
metatarsal tibial and fibular sesamoids. 10 mm geographic lucent
lesion first metatarsal head. Severe vascular calcifications. Soft
tissue planes are non suspicious.
IMPRESSION: 1. No fracture deformity or dislocation. No radiographic findings of
osteomyelitis.
2. Cystic lesion first metatarsal head may reflect gout or geode.
3. Severe vascular calcifications.

## 2020-04-24 ENCOUNTER — Other Ambulatory Visit: Payer: Self-pay

## 2020-04-24 ENCOUNTER — Emergency Department (HOSPITAL_COMMUNITY): Payer: Medicare Other

## 2020-04-24 ENCOUNTER — Inpatient Hospital Stay (HOSPITAL_COMMUNITY)
Admission: EM | Admit: 2020-04-24 | Discharge: 2020-04-26 | DRG: 436 | Disposition: A | Discharge status: Home / Self Care | Payer: Medicare Other | Attending: Internal Medicine | Admitting: Internal Medicine

## 2020-04-24 ENCOUNTER — Encounter (HOSPITAL_COMMUNITY): Payer: Self-pay | Admitting: Emergency Medicine

## 2020-04-24 DIAGNOSIS — E871 Hypo-osmolality and hyponatremia: Secondary | ICD-10-CM | POA: Diagnosis present

## 2020-04-24 DIAGNOSIS — Z91018 Allergy to other foods: Secondary | ICD-10-CM

## 2020-04-24 DIAGNOSIS — E1165 Type 2 diabetes mellitus with hyperglycemia: Secondary | ICD-10-CM | POA: Diagnosis present

## 2020-04-24 DIAGNOSIS — C259 Malignant neoplasm of pancreas, unspecified: Secondary | ICD-10-CM | POA: Diagnosis not present

## 2020-04-24 DIAGNOSIS — Z7982 Long term (current) use of aspirin: Secondary | ICD-10-CM

## 2020-04-24 DIAGNOSIS — E1151 Type 2 diabetes mellitus with diabetic peripheral angiopathy without gangrene: Secondary | ICD-10-CM | POA: Diagnosis present

## 2020-04-24 DIAGNOSIS — Z79891 Long term (current) use of opiate analgesic: Secondary | ICD-10-CM | POA: Diagnosis not present

## 2020-04-24 DIAGNOSIS — E1142 Type 2 diabetes mellitus with diabetic polyneuropathy: Secondary | ICD-10-CM | POA: Diagnosis present

## 2020-04-24 DIAGNOSIS — K8689 Other specified diseases of pancreas: Secondary | ICD-10-CM | POA: Diagnosis not present

## 2020-04-24 DIAGNOSIS — I10 Essential (primary) hypertension: Secondary | ICD-10-CM | POA: Diagnosis present

## 2020-04-24 DIAGNOSIS — M1990 Unspecified osteoarthritis, unspecified site: Secondary | ICD-10-CM | POA: Diagnosis present

## 2020-04-24 DIAGNOSIS — R739 Hyperglycemia, unspecified: Secondary | ICD-10-CM | POA: Diagnosis not present

## 2020-04-24 DIAGNOSIS — N2889 Other specified disorders of kidney and ureter: Secondary | ICD-10-CM

## 2020-04-24 DIAGNOSIS — R1010 Upper abdominal pain, unspecified: Secondary | ICD-10-CM

## 2020-04-24 DIAGNOSIS — I70222 Atherosclerosis of native arteries of extremities with rest pain, left leg: Secondary | ICD-10-CM | POA: Diagnosis present

## 2020-04-24 DIAGNOSIS — Z809 Family history of malignant neoplasm, unspecified: Secondary | ICD-10-CM

## 2020-04-24 DIAGNOSIS — R109 Unspecified abdominal pain: Secondary | ICD-10-CM | POA: Diagnosis present

## 2020-04-24 DIAGNOSIS — F17210 Nicotine dependence, cigarettes, uncomplicated: Secondary | ICD-10-CM | POA: Diagnosis present

## 2020-04-24 DIAGNOSIS — R630 Anorexia: Secondary | ICD-10-CM | POA: Diagnosis present

## 2020-04-24 DIAGNOSIS — Z89512 Acquired absence of left leg below knee: Secondary | ICD-10-CM | POA: Diagnosis not present

## 2020-04-24 DIAGNOSIS — K5900 Constipation, unspecified: Secondary | ICD-10-CM | POA: Diagnosis present

## 2020-04-24 DIAGNOSIS — K858 Other acute pancreatitis without necrosis or infection: Secondary | ICD-10-CM

## 2020-04-24 DIAGNOSIS — Z20822 Contact with and (suspected) exposure to covid-19: Secondary | ICD-10-CM | POA: Diagnosis present

## 2020-04-24 DIAGNOSIS — Z79899 Other long term (current) drug therapy: Secondary | ICD-10-CM | POA: Diagnosis not present

## 2020-04-24 LAB — CBC
HCT: 43.2 % (ref 39.0–52.0)
Hemoglobin: 13.8 g/dL (ref 13.0–17.0)
MCH: 27.3 pg (ref 26.0–34.0)
MCHC: 31.9 g/dL (ref 30.0–36.0)
MCV: 85.4 fL (ref 80.0–100.0)
Platelets: 257 10*3/uL (ref 150–400)
RBC: 5.06 MIL/uL (ref 4.22–5.81)
RDW: 15 % (ref 11.5–15.5)
WBC: 9.3 10*3/uL (ref 4.0–10.5)
nRBC: 0 % (ref 0.0–0.2)

## 2020-04-24 LAB — COMPREHENSIVE METABOLIC PANEL
ALT: 18 U/L (ref 0–44)
AST: 26 U/L (ref 15–41)
Albumin: 3.4 g/dL — ABNORMAL LOW (ref 3.5–5.0)
Alkaline Phosphatase: 122 U/L (ref 38–126)
Anion gap: 12 (ref 5–15)
BUN: 10 mg/dL (ref 8–23)
CO2: 20 mmol/L — ABNORMAL LOW (ref 22–32)
Calcium: 8.7 mg/dL — ABNORMAL LOW (ref 8.9–10.3)
Chloride: 101 mmol/L (ref 98–111)
Creatinine, Ser: 0.85 mg/dL (ref 0.61–1.24)
GFR calc Af Amer: >60 mL/min (ref >60)
GFR calc non Af Amer: >60 mL/min (ref >60)
Glucose, Bld: 523 mg/dL (ref 70–99)
Potassium: 3.8 mmol/L (ref 3.5–5.1)
Sodium: 133 mmol/L — ABNORMAL LOW (ref 135–145)
Total Bilirubin: 0.8 mg/dL (ref 0.3–1.2)
Total Protein: 7 g/dL (ref 6.5–8.1)

## 2020-04-24 LAB — GLUCOSE, CAPILLARY: Glucose-Capillary: 113 mg/dL — ABNORMAL HIGH (ref 70–99)

## 2020-04-24 LAB — URINALYSIS, ROUTINE W REFLEX MICROSCOPIC
Bacteria, UA: NONE SEEN
Bilirubin Urine: NEGATIVE
Glucose, UA: >=500 mg/dL — AB
Hgb urine dipstick: NEGATIVE
Ketones, ur: NEGATIVE mg/dL
Leukocytes,Ua: NEGATIVE
Nitrite: NEGATIVE
Protein, ur: NEGATIVE mg/dL
Specific Gravity, Urine: 1.036 — ABNORMAL HIGH (ref 1.005–1.030)
pH: 5 (ref 5.0–8.0)

## 2020-04-24 LAB — SARS CORONAVIRUS 2 BY RT PCR (HOSPITAL ORDER, PERFORMED IN ~~LOC~~ HOSPITAL LAB): SARS Coronavirus 2: NEGATIVE

## 2020-04-24 LAB — TROPONIN I (HIGH SENSITIVITY)
Troponin I (High Sensitivity): 11 ng/L (ref <18)
Troponin I (High Sensitivity): 12 ng/L (ref <18)

## 2020-04-24 LAB — LIPASE, BLOOD: Lipase: 923 U/L — ABNORMAL HIGH (ref 11–51)

## 2020-04-24 MED ORDER — SODIUM CHLORIDE 0.9 % IV SOLN: INTRAVENOUS | Status: DC

## 2020-04-24 MED ORDER — ENOXAPARIN SODIUM 40 MG/0.4ML ~~LOC~~ SOLN
40.0000 mg | SUBCUTANEOUS | Status: DC
  Administered 2020-04-24 – 2020-04-25 (×2): 40 mg via SUBCUTANEOUS
  Filled 2020-04-24 (×2): qty 0.4

## 2020-04-24 MED ORDER — ETOMIDATE 2 MG/ML IV SOLN
INTRAVENOUS | Status: AC
  Filled 2020-04-24: qty 10

## 2020-04-24 MED ORDER — ONDANSETRON HCL 4 MG/2ML IJ SOLN
4.0000 mg | Freq: Once | INTRAMUSCULAR | Status: AC
  Administered 2020-04-24: 4 mg via INTRAVENOUS
  Filled 2020-04-24: qty 2

## 2020-04-24 MED ORDER — MORPHINE SULFATE (PF) 4 MG/ML IV SOLN
4.0000 mg | Freq: Once | INTRAVENOUS | Status: AC
  Administered 2020-04-24: 4 mg via INTRAVENOUS
  Filled 2020-04-24: qty 1

## 2020-04-24 MED ORDER — INSULIN ASPART 100 UNIT/ML ~~LOC~~ SOLN
0.0000 [IU] | Freq: Three times a day (TID) | SUBCUTANEOUS | Status: DC
  Administered 2020-04-25 (×2): 3 [IU] via SUBCUTANEOUS

## 2020-04-24 MED ORDER — POLYETHYLENE GLYCOL 3350 17 G PO PACK: 17.0000 g | PACK | Freq: Every day | ORAL | Status: DC | PRN

## 2020-04-24 MED ORDER — IOHEXOL 300 MG/ML  SOLN
100.0000 mL | Freq: Once | INTRAMUSCULAR | Status: AC | PRN
  Administered 2020-04-24: 100 mL via INTRAVENOUS

## 2020-04-24 MED ORDER — ACETAMINOPHEN 325 MG PO TABS: 650.0000 mg | ORAL_TABLET | Freq: Four times a day (QID) | ORAL | Status: DC | PRN

## 2020-04-24 MED ORDER — BISACODYL 5 MG PO TBEC: 5.0000 mg | DELAYED_RELEASE_TABLET | Freq: Every day | ORAL | Status: DC | PRN

## 2020-04-24 MED ORDER — SODIUM CHLORIDE 0.9 % IV BOLUS
1000.0000 mL | Freq: Once | INTRAVENOUS | Status: AC
  Administered 2020-04-24: 1000 mL via INTRAVENOUS

## 2020-04-24 MED ORDER — INSULIN ASPART 100 UNIT/ML ~~LOC~~ SOLN
6.0000 [IU] | Freq: Once | SUBCUTANEOUS | Status: AC
  Administered 2020-04-24: 6 [IU] via SUBCUTANEOUS
  Filled 2020-04-24: qty 1

## 2020-04-24 MED ORDER — MORPHINE SULFATE (PF) 2 MG/ML IV SOLN: 2.0000 mg | INTRAVENOUS | Status: DC | PRN

## 2020-04-24 MED ORDER — HYDROCODONE-ACETAMINOPHEN 5-325 MG PO TABS: 1.0000 | ORAL_TABLET | ORAL | Status: DC | PRN

## 2020-04-24 NOTE — ED Provider Notes (Signed)
[facility] EMERGENCY DEPARTMENT Provider Note   CSN: [id number] Arrival date & time: 04/24/20  1120     History Chief Complaint  Patient presents with  . Abdominal Pain    John Rajewski Sr. is a 69 y.o. male with a history of diabetes, hypertension, arthritis and known heart murmur, also significant peripheral vascular disease presenting with a 3-day history of abdominal pain.  He describes near constant sharp pain in his upper abdomen with reduced oral intake.  He last drank some water yesterday which seem to worsen the pain.  He denies fevers or chills, also denies nausea or vomiting, no diarrhea or constipation, stating his last bowel movement was several days ago and normal.  He denies prior history of similar symptoms.  He does not have abdominal distention but felt increased bloating when he tried to drink yesterday.  He also denies chest pain or shortness of breath.  He has had no treatments and has found no alleviators prior to arrival.  HPI     Past Medical History:  Diagnosis Date  . Arthritis   . Diabetes mellitus without complication ([place])   . Emesis   . Heart murmur   . Hyperglycemia   . Hypertension   . Joint pain     Patient Active Problem List   Diagnosis Date Noted  . Unilateral complete BKA, left, initial encounter ([place])   . Type 2 diabetes mellitus with peripheral neuropathy ([facility])   . Essential hypertension   . Left below-knee amputee ([place]) 08/05/2018  . Dyslipidemia   . Acute blood loss anemia   . Tobacco abuse   . Benign essential HTN   . PAD (peripheral artery disease) ([place]) 07/20/2018  . Cellulitis in diabetic foot ([place]) 07/20/2018  . Diabetes mellitus, type II ([employer]) 07/20/2018  . Cellulitis 07/20/2018  . Dry gangrene ([place]) 07/20/2018    Past Surgical History:  Procedure Laterality Date  . AMPUTATION Left 08/02/2018   Procedure: LEFT AMPUTATION BELOW KNEE;  Surgeon: Marty Heck, MD;  Location: [facility];  Service: Vascular;  Laterality:  Left;  . ELBOW SURGERY     Bone graft to repair elbow  . LOWER EXTREMITY ANGIOGRAPHY N/A 07/24/2018   Procedure: LOWER EXTREMITY ANGIOGRAPHY;  Surgeon: Marty Heck, MD;  Location: [facility];  Service: Cardiovascular;  Laterality: N/A;  . PERIPHERAL VASCULAR INTERVENTION  07/24/2018   Procedure: PERIPHERAL VASCULAR INTERVENTION;  Surgeon: Marty Heck, MD;  Location: [facility];  Service: Cardiovascular;;  . TONSILLECTOMY    . TRANSMETATARSAL AMPUTATION Left 07/25/2018   Procedure: LEFT GREAT TOE AMPUTATION;  Surgeon: Marty Heck, MD;  Location: [place];  Service: Vascular;  Laterality: Left;  . TRANSMETATARSAL AMPUTATION Left 07/30/2018   Procedure: TRANSMETATARSAL AMPUTATION;  Surgeon: Waynetta Sandy, MD;  Location: [facility];  Service: Vascular;  Laterality: Left;       Family History  Problem Relation Age of Onset  . Cancer Sister     Social History   Tobacco Use  . Smoking status: Current Every Day Smoker    Packs/day: 0.50    Types: Cigarettes  . Smokeless tobacco: Former Systems developer    Types: Snuff    Quit date: 04/21/1989  Vaping Use  . Vaping Use: Never used  Substance Use Topics  . Alcohol use: Yes    Alcohol/week: 12.0 standard drinks    Types: 12 Cans of beer per week  . Drug use: No    Home Medications Prior to Admission  medications   Medication Sig Start Date End Date Taking? Authorizing Provider  acetaminophen (TYLENOL) 325 MG tablet Take 2 tablets (650 mg total) by mouth every 6 (six) hours as needed for mild pain, moderate pain or fever. 08/05/18   Nita Sells, MD  amLODipine (NORVASC) 10 MG tablet Take 1 tablet (10 mg total) by mouth daily. 09/27/18   Kirsteins, Luanna Salk, MD  aspirin EC 81 MG EC tablet Take 1 tablet (81 mg total) by mouth daily. 08/06/18   Nita Sells, MD  atorvastatin (LIPITOR) 20 MG tablet Take 1 tablet (20 mg total) by mouth daily at 6 PM. 09/27/18   Kirsteins, Luanna Salk, MD    carvedilol (COREG) 6.25 MG tablet Take 1 tablet (6.25 mg total) by mouth 2 (two) times daily with a meal. 09/27/18   Kirsteins, Luanna Salk, MD  clopidogrel (PLAVIX) 75 MG tablet Take 1 tablet (75 mg total) by mouth daily with breakfast. 09/27/18   Kirsteins, Luanna Salk, MD  hydrALAZINE (APRESOLINE) 50 MG tablet Take 1 tablet (50 mg total) by mouth every 8 (eight) hours. 09/27/18   Kirsteins, Luanna Salk, MD  metFORMIN (GLUCOPHAGE) 500 MG tablet Take 1 tablet (500 mg total) by mouth 2 (two) times daily with a meal. 09/27/18   Kirsteins, Luanna Salk, MD  nicotine (NICODERM CQ - DOSED IN MG/24 HOURS) 14 mg/24hr patch 14 mg patch daily 2 weeks then 7 mg patch daily 3 weeks and stop 08/09/18   Angiulli, Lavon Paganini, PA-C    Allergies    Tea  Review of Systems   Review of Systems  Constitutional: Negative for chills and fever.  HENT: Negative for congestion and sore throat.   Eyes: Negative.   Respiratory: Negative for chest tightness and shortness of breath.   Cardiovascular: Negative for chest pain.  Gastrointestinal: Positive for abdominal pain. Negative for blood in stool, constipation, diarrhea, nausea and vomiting.  Genitourinary: Negative.   Musculoskeletal: Negative for arthralgias, joint swelling and neck pain.  Skin: Negative.  Negative for rash and wound.  Neurological: Negative for dizziness, weakness, light-headedness, numbness and headaches.  Psychiatric/Behavioral: Negative.     Physical Exam Updated Vital Signs BP (!) 125/64   Pulse 77   Temp 98 F (36.7 C) (Oral)   Resp 20   Ht 5\' 11"  (1.803 m)   Wt 68.5 kg   SpO2 95%   BMI 21.06 kg/m   Physical Exam Vitals and nursing note reviewed.  Constitutional:      Appearance: He is well-developed.  HENT:     Head: Normocephalic and atraumatic.  Eyes:     Conjunctiva/sclera: Conjunctivae normal.  Cardiovascular:     Rate and Rhythm: Normal rate and regular rhythm.     Heart sounds: Normal heart sounds.  Pulmonary:     Effort:  Pulmonary effort is normal.     Breath sounds: Normal breath sounds. No wheezing.  Abdominal:     General: Bowel sounds are normal. There is distension.     Palpations: Abdomen is soft.     Tenderness: There is abdominal tenderness in the right upper quadrant, epigastric area and left upper quadrant. There is no guarding.     Comments: No increased tympany to percussion.  Musculoskeletal:        General: Normal range of motion.     Cervical back: Normal range of motion.     Comments: Left BKA.  Skin:    General: Skin is warm and dry.  Neurological:     General:  No focal deficit present.     Mental Status: He is alert.     ED Results / Procedures / Treatments   Labs (all labs ordered are listed, but only abnormal results are displayed) Labs Reviewed  LIPASE, BLOOD - Abnormal; Notable for the following components:      Result Value   Lipase 923 (*)    All other components within normal limits  COMPREHENSIVE METABOLIC PANEL - Abnormal; Notable for the following components:   Sodium 133 (*)    CO2 20 (*)    Glucose, Bld 523 (*)    Calcium 8.7 (*)    Albumin 3.4 (*)    All other components within normal limits  URINALYSIS, ROUTINE W REFLEX MICROSCOPIC - Abnormal; Notable for the following components:   Specific Gravity, Urine 1.036 (*)    Glucose, UA >=500 (*)    All other components within normal limits  SARS CORONAVIRUS 2 BY RT PCR (HOSPITAL ORDER, [facility])  CBC  TROPONIN I (HIGH SENSITIVITY)  TROPONIN I (HIGH SENSITIVITY)    EKG None  Radiology CT ABDOMEN PELVIS W CONTRAST  Result Date: 04/24/2020 CLINICAL DATA:  LEFT upper quadrant abdominal pain. EXAM: CT ABDOMEN AND PELVIS WITH CONTRAST TECHNIQUE: Multidetector CT imaging of the abdomen and pelvis was performed using the standard protocol following bolus administration of intravenous contrast. CONTRAST:  171mL OMNIPAQUE IOHEXOL 300 MG/ML  SOLN COMPARISON:  None. FINDINGS: Lower chest: 5  mm pleural based nodular density within the RIGHT lower lobe. Emphysematous blebs at the RIGHT lung base. Hepatobiliary: Scattered small hypodense foci throughout the bilateral liver lobes, largest within the posterior RIGHT liver lobe measuring 1.1 cm majority too small to definitively characterize largest suspicious for neoplastic/metastatic process. Gallstones. Questionable gallbladder wall thickening. No pericholecystic fluid. No bile duct dilatation seen Pancreas: Hypodense mass within the head/uncinate process of the pancreas, measuring 2.2 cm, highly suspicious for pancreatic neoplasm. No pancreatic duct dilatation seen. Spleen: Normal in size without focal abnormality. Adrenals/Urinary Tract: Adrenal glands appear normal. 1.4 cm mass within the lateral cortex of the RIGHT kidney, with probable enhancement centrally, suspicious for additional neoplastic mass, alternatively angiomyolipoma (axial series 2, image 31; coronal series 5, image 59). LEFT kidney appears. No hydronephrosis. No ureteral or bladder calculi identified. Bladder appears normal. Stomach/Bowel: No dilated large or small bowel loops. No evidence of bowel wall inflammation. Appendix is normal. Stomach is unremarkable, partially decompressed. Vascular/Lymphatic: Aortic atherosclerosis. No acute appearing vascular abnormality. Mildly prominent lymph nodes within the upper abdomen, just above the pancreatic head with short axis measurement of 11 mm and portacaval space with short axis measurement of 12 mm. No enlarged lymph nodes seen elsewhere within the abdomen or pelvis. Reproductive: Prostate is unremarkable. Other: No free fluid or free intraperitoneal air. Musculoskeletal: Degenerative spondylosis of the thoracolumbar spine, mild to moderate in degree. No acute or suspicious osseous findings identified. IMPRESSION: 1. Hypodense mass within the pancreatic head/uncinate process, measuring 2.2 cm, highly suspicious for pancreatic neoplasm.  Recommend further characterization with pancreatic protocol MRI. 2. Scattered small hypodense foci throughout the bilateral liver lobes, largest within the posterior RIGHT liver lobe measuring 1.1 cm, majority too small to definitively characterize but largest suspicious for neoplastic/metastatic process. 3. 1.4 cm mass within the lateral cortex of the RIGHT kidney, with probable enhancement centrally, suspicious for additional neoplastic mass, alternatively benign angiomyolipoma. Recommend further characterization with nonemergent renal MRI. 4. Mildly prominent lymph nodes within the upper abdomen, just above the pancreatic head and  portacaval space, suspicious for metastatic lymphadenopathy. 5. 5 mm pleural based nodular density within the RIGHT lower lobe, nonspecific but suspicious given the presumed pancreatic neoplasm, liver metastases and possible concomitant renal neoplasm. 6. Cholelithiasis. Questionable gallbladder wall thickening but no pericholecystic fluid to suggest acute cholecystitis. If any localizable RIGHT upper quadrant pain and/or clinical suspicion for acute cholecystitis, consider RIGHT upper quadrant ultrasound for further characterization. Aortic Atherosclerosis (ICD10-I70.0) and Emphysema (ICD10-J43.9). Electronically Signed   By: Franki Cabot M.D.   On: 04/24/2020 15:11    Procedures Procedures (including critical care time)  Medications Ordered in ED Medications  insulin aspart (novoLOG) injection 6 Units (has no administration in time range)  morphine 4 MG/ML injection 4 mg (4 mg Intravenous Given 04/24/20 1247)  ondansetron (ZOFRAN) injection 4 mg (4 mg Intravenous Given 04/24/20 1246)  sodium chloride 0.9 % bolus 1,000 mL (0 mLs Intravenous Stopped 04/24/20 1350)  iohexol (OMNIPAQUE) 300 MG/ML solution 100 mL (100 mLs Intravenous Contrast Given 04/24/20 1452)    ED Course  I have reviewed the triage vital signs and the nursing notes.  Pertinent labs & imaging results  that were available during my care of the patient were reviewed by me and considered in my medical decision making (see chart for details).    MDM Rules/Calculators/A&P                          Patient presenting with upper abdominal pain with labs and CT imaging revealing for significantly elevated lipase level and concern for pancreatic mass, ? Right kidney mass, cannot rule out liver mets.  There is also borderline gallbladder wall thickening, but no stones or other signs suggesting acute cholecystitis.  He also is significantly hyperglycemic without acidosis.  Patient reports he has a history of distant EtOH abuse, not in a number of years, never had hx of pancreatitis in the past.  He was put on Metformin for his diabetes when he was discharged from [facility] with his left BKA November 2019.  Unfortunately he never was able to find a primary MD, therefore never continued  this medication.    Discussed with Dr. Jamse Arn who accepts pt for admission.    Final Clinical Impression(s) / ED Diagnoses Final diagnoses:  Pain of upper abdomen  Other acute pancreatitis, unspecified complication status  Pancreatic mass  Hyperglycemia    Rx / DC Orders ED Discharge Orders    None       Landis Martins 04/24/20 2012    Maudie Flakes, MD 04/25/20 [phone]

## 2020-04-24 NOTE — H&P (Addendum)
History and Physical:    John Crowson Sr.   FAO:[id number] DOB: 1951-07-18 DOA: 04/24/2020  Referring MD/provider: Evalee Jefferson, PA PCP: Patient, No Pcp Per   Patient coming from: Home  Chief Complaint: Abdominal pain  History of Present Illness:   John Baby Sr. is an 69 y.o. male with PMH significant for HTN, DM 2, PVD status post left BKA who was in his usual state of health until 2 to 3 weeks ago when he noted anorexia, food aversion and increasing epigastric pain.  Over the past 3 to 4 days patient states that the epigastric pain has gotten worse "and sometimes it feels like my stomach is twisting around".  Patient has never had symptoms like this before.  Notes he usually has a good appetite.  Patient also admits to constipation and has not had a bowel movement in a several days of days "but I have not been eating much anyway".  Patient notes that yesterday he tried to drink some water but felt like his belly was bloated and felt uncomfortable.  Patient denies fevers or chills.  He does admit to feeling weak because he has had decreased p.o. intake.  No dizziness, syncope or presyncope.  No chest pain.  No falls.  Patient denies GI bleed as far as he knows.  No melena.  No nausea vomiting or hemoptysis.  Of note, patient states he is not on any medications for any of his medical conditions because he is not been able to find a physician in the past couple of years.  He attributes this to Covid.  ED Course:  The patient was seen in the ED and noted to be in some pain and was treated with IV morphine with improvement in pain.  He was afebrile and normotensive, labs were notable for normal BUN and creatinine.  Patient underwent a abdominal pelvic CT which showed multiple abnormalities including: 1) 2.2 cm pancreatic mass suspicious for neoplasm. 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 3)  Possible metastatic disease in lymph nodes,  liver and lungs.  Patient is now admitted for work-up of likely malignancy, treatment of pain. ROS:   ROS   Review of Systems: As per HPI  Past Medical History:   Past Medical History:  Diagnosis Date  . Arthritis   . Diabetes mellitus without complication ([place])   . Emesis   . Heart murmur   . Hyperglycemia   . Hypertension   . Joint pain     Past Surgical History:   Past Surgical History:  Procedure Laterality Date  . AMPUTATION Left 08/02/2018   Procedure: LEFT AMPUTATION BELOW KNEE;  Surgeon: Marty Heck, MD;  Location: [facility];  Service: Vascular;  Laterality: Left;  . ELBOW SURGERY     Bone graft to repair elbow  . LOWER EXTREMITY ANGIOGRAPHY N/A 07/24/2018   Procedure: LOWER EXTREMITY ANGIOGRAPHY;  Surgeon: Marty Heck, MD;  Location: [facility];  Service: Cardiovascular;  Laterality: N/A;  . PERIPHERAL VASCULAR INTERVENTION  07/24/2018   Procedure: PERIPHERAL VASCULAR INTERVENTION;  Surgeon: Marty Heck, MD;  Location: [facility];  Service: Cardiovascular;;  . TONSILLECTOMY    . TRANSMETATARSAL AMPUTATION Left 07/25/2018   Procedure: LEFT GREAT TOE AMPUTATION;  Surgeon: Marty Heck, MD;  Location: [facility];  Service: Vascular;  Laterality: Left;  . TRANSMETATARSAL AMPUTATION Left 07/30/2018   Procedure: TRANSMETATARSAL AMPUTATION;  Surgeon: Waynetta Sandy, MD;  Location: [facility];  Service: Vascular;  Laterality: Left;    Social History:   Social History   Socioeconomic History  . Marital status: Married    Spouse name: Not on file  . Number of children: Not on file  . Years of education: Not on file  . Highest education level: Not on file  Occupational History  . Not on file  Tobacco Use  . Smoking status: Current Every Day Smoker    Packs/day: 0.50    Types: Cigarettes  . Smokeless tobacco: Former Systems developer    Types: Snuff    Quit date: 04/21/1989  Vaping Use  . Vaping Use: Never used  Substance and  Sexual Activity  . Alcohol use: Yes    Alcohol/week: 12.0 standard drinks    Types: 12 Cans of beer per week  . Drug use: No  . Sexual activity: Not Currently  Other Topics Concern  . Not on file  Social History Narrative  . Not on file   Social Determinants of Health   Financial Resource Strain:   . Difficulty of Paying Living Expenses:   Food Insecurity:   . Worried About Charity fundraiser in the Last Year:   . Arboriculturist in the Last Year:   Transportation Needs:   . Film/video editor (Medical):   Marland Kitchen Lack of Transportation (Non-Medical):   Physical Activity:   . Days of Exercise per Week:   . Minutes of Exercise per Session:   Stress:   . Feeling of Stress :   Social Connections:   . Frequency of Communication with Friends and Family:   . Frequency of Social Gatherings with Friends and Family:   . Attends Religious Services:   . Active Member of Clubs or Organizations:   . Attends Archivist Meetings:   Marland Kitchen Marital Status:   Intimate Partner Violence:   . Fear of Current or Ex-Partner:   . Emotionally Abused:   Marland Kitchen Physically Abused:   . Sexually Abused:     Allergies   Tea  Family history:   Family History  Problem Relation Age of Onset  . Cancer Sister     Current Medications:   Prior to Admission medications   Medication Sig Start Date End Date Taking? Authorizing Provider  acetaminophen (TYLENOL) 325 MG tablet Take 2 tablets (650 mg total) by mouth every 6 (six) hours as needed for mild pain, moderate pain or fever. 08/05/18  Yes Nita Sells, MD  aspirin EC 81 MG EC tablet Take 1 tablet (81 mg total) by mouth daily. Patient taking differently: Take 81 mg by mouth daily as needed (heart health).  08/06/18  Yes Nita Sells, MD    Physical Exam:   Vitals:   04/24/20 1300 04/24/20 1400 04/24/20 1500 04/24/20 1648  BP: (!) 135/87 (!) 133/87 (!) 125/64 (!) 143/77  Pulse: 74 73 77 74  Resp: 21 21 20 20   Temp:        TempSrc:      SpO2: 94% 95% 95% 99%  Weight:      Height:         Physical Exam: Blood pressure (!) 143/77, pulse 74, temperature 98 F (36.7 C), temperature source Oral, resp. rate 20, height 5\' 11"  (1.803 m), weight 68.5 kg, SpO2 99 %. Gen: Pale, chronically ill-appearing man looking much older than stated age lying flat in bed in no acute distress. Eyes: sclera anicteric, conjuctiva pale bilaterally. CVS: S1-S2, regulary, 2/6 systolic  murmur. Respiratory:  decreased air entry likely secondary to decreased inspiratory effort with rales at bases GI: NABS, soft, somewhat scaphoid, nontender to light palpation.  I am unable to palpate any masses.  No CVA tenderness. LE: No edema. No cyanosis Neuro: A/O x 3, Moving all extremities equally with normal strength, CN 3-12 intact, grossly nonfocal.  Psych: patient is logical and coherent, judgement and insight appear poor, mood and affect appropriate to situation. Skin: Nicotine staining of fingers.  Status post BKA.   Data Review:    Labs: Basic Metabolic Panel: Recent Labs  Lab 04/24/20 1207  NA 133*  K 3.8  CL 101  CO2 20*  GLUCOSE 523*  BUN 10  CREATININE 0.85  CALCIUM 8.7*   Liver Function Tests: Recent Labs  Lab 04/24/20 1207  AST 26  ALT 18  ALKPHOS 122  BILITOT 0.8  PROT 7.0  ALBUMIN 3.4*   Recent Labs  Lab 04/24/20 1207  LIPASE 923*   No results for input(s): AMMONIA in the last 168 hours. CBC: Recent Labs  Lab 04/24/20 1207  WBC 9.3  HGB 13.8  HCT 43.2  MCV 85.4  PLT 257   Cardiac Enzymes: No results for input(s): CKTOTAL, CKMB, CKMBINDEX, TROPONINI in the last 168 hours.  BNP (last 3 results) No results for input(s): PROBNP in the last 8760 hours. CBG: No results for input(s): GLUCAP in the last 168 hours.  Urinalysis    Component Value Date/Time   COLORURINE YELLOW 04/24/2020 1251   APPEARANCEUR CLEAR 04/24/2020 1251   LABSPEC 1.036 (H) 04/24/2020 1251   PHURINE 5.0 04/24/2020 1251    GLUCOSEU >=500 (A) 04/24/2020 1251   HGBUR NEGATIVE 04/24/2020 1251   BILIRUBINUR NEGATIVE 04/24/2020 1251   KETONESUR NEGATIVE 04/24/2020 1251   PROTEINUR NEGATIVE 04/24/2020 1251   NITRITE NEGATIVE 04/24/2020 1251   LEUKOCYTESUR NEGATIVE 04/24/2020 1251      Radiographic Studies: CT ABDOMEN PELVIS W CONTRAST  Result Date: 04/24/2020 CLINICAL DATA:  LEFT upper quadrant abdominal pain. EXAM: CT ABDOMEN AND PELVIS WITH CONTRAST TECHNIQUE: Multidetector CT imaging of the abdomen and pelvis was performed using the standard protocol following bolus administration of intravenous contrast. CONTRAST:  153mL OMNIPAQUE IOHEXOL 300 MG/ML  SOLN COMPARISON:  None. FINDINGS: Lower chest: 5 mm pleural based nodular density within the RIGHT lower lobe. Emphysematous blebs at the RIGHT lung base. Hepatobiliary: Scattered small hypodense foci throughout the bilateral liver lobes, largest within the posterior RIGHT liver lobe measuring 1.1 cm majority too small to definitively characterize largest suspicious for neoplastic/metastatic process. Gallstones. Questionable gallbladder wall thickening. No pericholecystic fluid. No bile duct dilatation seen Pancreas: Hypodense mass within the head/uncinate process of the pancreas, measuring 2.2 cm, highly suspicious for pancreatic neoplasm. No pancreatic duct dilatation seen. Spleen: Normal in size without focal abnormality. Adrenals/Urinary Tract: Adrenal glands appear normal. 1.4 cm mass within the lateral cortex of the RIGHT kidney, with probable enhancement centrally, suspicious for additional neoplastic mass, alternatively angiomyolipoma (axial series 2, image 31; coronal series 5, image 59). LEFT kidney appears. No hydronephrosis. No ureteral or bladder calculi identified. Bladder appears normal. Stomach/Bowel: No dilated large or small bowel loops. No evidence of bowel wall inflammation. Appendix is normal. Stomach is unremarkable, partially decompressed.  Vascular/Lymphatic: Aortic atherosclerosis. No acute appearing vascular abnormality. Mildly prominent lymph nodes within the upper abdomen, just above the pancreatic head with short axis measurement of 11 mm and portacaval space with short axis measurement of 12 mm. No enlarged lymph nodes seen elsewhere within the  abdomen or pelvis. Reproductive: Prostate is unremarkable. Other: No free fluid or free intraperitoneal air. Musculoskeletal: Degenerative spondylosis of the thoracolumbar spine, mild to moderate in degree. No acute or suspicious osseous findings identified. IMPRESSION: 1. Hypodense mass within the pancreatic head/uncinate process, measuring 2.2 cm, highly suspicious for pancreatic neoplasm. Recommend further characterization with pancreatic protocol MRI. 2. Scattered small hypodense foci throughout the bilateral liver lobes, largest within the posterior RIGHT liver lobe measuring 1.1 cm, majority too small to definitively characterize but largest suspicious for neoplastic/metastatic process. 3. 1.4 cm mass within the lateral cortex of the RIGHT kidney, with probable enhancement centrally, suspicious for additional neoplastic mass, alternatively benign angiomyolipoma. Recommend further characterization with nonemergent renal MRI. 4. Mildly prominent lymph nodes within the upper abdomen, just above the pancreatic head and portacaval space, suspicious for metastatic lymphadenopathy. 5. 5 mm pleural based nodular density within the RIGHT lower lobe, nonspecific but suspicious given the presumed pancreatic neoplasm, liver metastases and possible concomitant renal neoplasm. 6. Cholelithiasis. Questionable gallbladder wall thickening but no pericholecystic fluid to suggest acute cholecystitis. If any localizable RIGHT upper quadrant pain and/or clinical suspicion for acute cholecystitis, consider RIGHT upper quadrant ultrasound for further characterization. Aortic Atherosclerosis (ICD10-I70.0) and Emphysema  (ICD10-J43.9). Electronically Signed   By: Franki Cabot M.D.   On: 04/24/2020 15:11    EKG: Independently reviewed.  Sinus rhythm at 70.  Occasional PAC.  Assubel LVH by voltage.  Normal axis.  T wave inversions to 3 and F and L4-L6.  Assessment/Plan:   Active Problems:   Left below-knee amputee (HCC)   Type 2 diabetes mellitus with peripheral neuropathy (HCC)   Essential hypertension   Pancreatic mass   Renal mass  69 year old man with HTN, PVD, DM 2 on no medications is admitted with a pancreatic mass and a renal mass with what looks like possible metastases to lung, liver and lymph nodes.  Malignancy work-up of pancreatic and renal mass Discussed with Dr. Delton Coombes, he will see him either as an inpatient or an outpatient for further work-up and treatment.  Consult placed and secure chat sent.  Pain management Will treat with as needed morphine for pain control  Hyponatremia Appears to be chronic, possibly secondary to decreased p.o. intake.  Hyperglycemia secondary to DM2 Will treat with every 4 hours fingersticks and SSI coverage until sugars are under better control after which he can be converted to [facility] at bedtime SSI.  HTN Patient is normotensive off all medications, patient has had some weight loss.  PVD Patient is only on as needed aspirin and he continues to smoke. We will hold aspirin for now as patient may need a biopsy at some point in the near future.     Other information:   DVT prophylaxis: Lovenox ordered. Code Status: Full Family Communication: Patient states his wife knows he is here Disposition Plan: Home Consults called: Oncology to see in a.m. Admission status: Inpatient  [facility]  If 7PM-7AM, please contact night-coverage [email or web address] Password [facility] 04/24/2020, 6:00 PM

## 2020-04-24 NOTE — ED Notes (Signed)
Call from lab  Critical BS [place] informed

## 2020-04-24 NOTE — ED Notes (Signed)
Patient transported to CT 

## 2020-04-24 NOTE — ED Triage Notes (Signed)
Pt c/o of abdominal pain x 3 days.

## 2020-04-25 LAB — CBC
HCT: 41 % (ref 39.0–52.0)
Hemoglobin: 13.3 g/dL (ref 13.0–17.0)
MCH: 27.8 pg (ref 26.0–34.0)
MCHC: 32.4 g/dL (ref 30.0–36.0)
MCV: 85.6 fL (ref 80.0–100.0)
Platelets: 217 10*3/uL (ref 150–400)
RBC: 4.79 MIL/uL (ref 4.22–5.81)
RDW: 15 % (ref 11.5–15.5)
WBC: 10.2 10*3/uL (ref 4.0–10.5)
nRBC: 0 % (ref 0.0–0.2)

## 2020-04-25 LAB — GLUCOSE, CAPILLARY
Glucose-Capillary: 228 mg/dL — ABNORMAL HIGH (ref 70–99)
Glucose-Capillary: 231 mg/dL — ABNORMAL HIGH (ref 70–99)
Glucose-Capillary: 177 mg/dL — ABNORMAL HIGH (ref 70–99)
Glucose-Capillary: 159 mg/dL — ABNORMAL HIGH (ref 70–99)

## 2020-04-25 LAB — BASIC METABOLIC PANEL
Anion gap: 9 (ref 5–15)
BUN: 8 mg/dL (ref 8–23)
CO2: 22 mmol/L (ref 22–32)
Calcium: 8.4 mg/dL — ABNORMAL LOW (ref 8.9–10.3)
Chloride: 106 mmol/L (ref 98–111)
Creatinine, Ser: 0.65 mg/dL (ref 0.61–1.24)
GFR calc Af Amer: >60 mL/min (ref >60)
GFR calc non Af Amer: >60 mL/min (ref >60)
Glucose, Bld: 258 mg/dL — ABNORMAL HIGH (ref 70–99)
Potassium: 3.9 mmol/L (ref 3.5–5.1)
Sodium: 137 mmol/L (ref 135–145)

## 2020-04-25 LAB — HIV ANTIBODY (ROUTINE TESTING W REFLEX): HIV Screen 4th Generation wRfx: NONREACTIVE

## 2020-04-25 LAB — HEMOGLOBIN A1C
Hgb A1c MFr Bld: 10.8 % — ABNORMAL HIGH (ref 4.8–5.6)
Mean Plasma Glucose: 263.26 mg/dL

## 2020-04-25 MED ORDER — INSULIN ASPART 100 UNIT/ML ~~LOC~~ SOLN: 0.0000 [IU] | Freq: Every day | SUBCUTANEOUS | Status: DC

## 2020-04-25 MED ORDER — INSULIN ASPART 100 UNIT/ML ~~LOC~~ SOLN
0.0000 [IU] | Freq: Three times a day (TID) | SUBCUTANEOUS | Status: DC
  Administered 2020-04-25: 3 [IU] via SUBCUTANEOUS
  Administered 2020-04-26: 5 [IU] via SUBCUTANEOUS
  Administered 2020-04-26: 3 [IU] via SUBCUTANEOUS

## 2020-04-25 MED ORDER — HYDROCODONE-ACETAMINOPHEN 5-325 MG PO TABS
1.0000 | ORAL_TABLET | ORAL | Status: DC | PRN
  Administered 2020-04-25: 1 via ORAL
  Filled 2020-04-25: qty 1

## 2020-04-25 NOTE — Progress Notes (Signed)
PROGRESS NOTE    John Wolman Sr.  QIO:[id number] DOB: 07-05-1951 DOA: 04/24/2020 PCP: Patient, No Pcp Per   Brief Narrative:  Per HPI: John Islam Sr. is an 69 y.o. male with PMH significant for HTN, DM 2, PVD status post left BKA who was in his usual state of health until 2 to 3 weeks ago when he noted anorexia, food aversion and increasing epigastric pain.  Over the past 3 to 4 days patient states that the epigastric pain has gotten worse "and sometimes it feels like my stomach is twisting around".  Patient has never had symptoms like this before.  Notes he usually has a good appetite.  Patient also admits to constipation and has not had a bowel movement in a several days of days "but I have not been eating much anyway".  Patient notes that yesterday he tried to drink some water but felt like his belly was bloated and felt uncomfortable.  Patient denies fevers or chills.  He does admit to feeling weak because he has had decreased p.o. intake.  No dizziness, syncope or presyncope.  No chest pain.  No falls.  Patient denies GI bleed as far as he knows.  No melena.  No nausea vomiting or hemoptysis.  Of note, patient states he is not on any medications for any of his medical conditions because he is not been able to find a physician in the past couple of years.  He attributes this to Covid.  8/1: Patient with pain and only receiving IV morphine.  Plan to discontinue and see how well he does with oral narcotics for pain management.  He denies nausea or vomiting or abdominal pain.  Discussed case with oncology with CA 19-9 ordered.  Anticipate discharge tomorrow if stable.  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 Delton Coombes, he will see him either as an inpatient or an  outpatient for further work-up and treatment.   -CA 19-9 ordered  Pain management for above Discontinue IV morphine and continue to use Norco -Patient can tolerate p.o.  Hyponatremia -Resolved  Hyperglycemia secondary to DM2 -Continues to remain persistent, increase SSI  HTN Patient is normotensive off all medications, patient has had some weight loss.  PVD Patient is only on as needed aspirin and he continues to smoke. We will hold aspirin for now as patient may need a biopsy at some point in the near future.   DVT prophylaxis: Lovenox Code Status: Full Family Communication: Wife and daughter in room Disposition Plan:   Status is: Inpatient  Remains inpatient appropriate because:Ongoing active pain requiring inpatient pain management and IV treatments appropriate due to intensity of illness or inability to take PO   Dispo: The patient is from: Home              Anticipated d/c is to: Home              Anticipated d/c date is: 1 day              Patient currently is not medically stable to d/c.  Consultants:   Oncology  Procedures:   See below  Antimicrobials:   None   Subjective: Patient seen and evaluated today with ongoing abdominal pain noted.  No nausea or vomiting or diarrhea.  He is tolerating intake of his meals.  Objective: Vitals:   04/24/20 2100 04/25/20 0103 04/25/20 0530 04/25/20 0747  BP:  (!) 148/92 (!) 145/85 (!) 147/68  Pulse:  69 49 50  Resp:  16 16 18   Temp:  98 F (36.7 C) (!) 97.4 F (36.3 C) 98.2 F (36.8 C)  TempSrc:  Oral Oral Oral  SpO2: 94% 98% 100% 98%  Weight:      Height:        Intake/Output Summary (Last 24 hours) at 04/25/2020 1247 Last data filed at 04/25/2020 0900 Gross per 24 hour  Intake 2351.13 ml  Output 400 ml  Net 1951.13 ml   Filed Weights   04/24/20 1141 04/24/20 2036  Weight: 68.5 kg 67.2 kg    Examination:  General exam: Appears calm and comfortable  Respiratory system: Clear to  auscultation. Respiratory effort normal. Cardiovascular system: S1 & S2 heard, RRR. No JVD, murmurs, rubs, gallops or clicks. No pedal edema. Gastrointestinal system: Abdomen is soft and tender to palpation throughout Central nervous system: Alert and oriented. No focal neurological deficits. Extremities: Left BKA Skin: No rashes, lesions or ulcers Psychiatry: Judgement and insight appear normal. Mood & affect appropriate.     Data Reviewed: I have personally reviewed following labs and imaging studies  CBC: Recent Labs  Lab 04/24/20 1207 04/25/20 0640  WBC 9.3 10.2  HGB 13.8 13.3  HCT 43.2 41.0  MCV 85.4 85.6  PLT 257 275   Basic Metabolic Panel: Recent Labs  Lab 04/24/20 1207 04/25/20 0640  NA 133* 137  K 3.8 3.9  CL 101 106  CO2 20* 22  GLUCOSE 523* 258*  BUN 10 8  CREATININE 0.85 0.65  CALCIUM 8.7* 8.4*   GFR: Estimated Creatinine Clearance: 85.2 mL/min (by C-G formula based on SCr of 0.65 mg/dL). Liver Function Tests: Recent Labs  Lab 04/24/20 1207  AST 26  ALT 18  ALKPHOS 122  BILITOT 0.8  PROT 7.0  ALBUMIN 3.4*   Recent Labs  Lab 04/24/20 1207  LIPASE 923*   No results for input(s): AMMONIA in the last 168 hours. Coagulation Profile: No results for input(s): INR, PROTIME in the last 168 hours. Cardiac Enzymes: No results for input(s): CKTOTAL, CKMB, CKMBINDEX, TROPONINI in the last 168 hours. BNP (last 3 results) No results for input(s): PROBNP in the last 8760 hours. HbA1C: Recent Labs    04/24/20 1207  HGBA1C 10.8*   CBG: Recent Labs  Lab 04/24/20 2017 04/25/20 0750 04/25/20 1124  GLUCAP 113* 228* 231*   Lipid Profile: No results for input(s): CHOL, HDL, LDLCALC, TRIG, CHOLHDL, LDLDIRECT in the last 72 hours. Thyroid Function Tests: No results for input(s): TSH, T4TOTAL, FREET4, T3FREE, THYROIDAB in the last 72 hours. Anemia Panel: No results for input(s): VITAMINB12, FOLATE, FERRITIN, TIBC, IRON, RETICCTPCT in the last 72  hours. Sepsis Labs: No results for input(s): PROCALCITON, LATICACIDVEN in the last 168 hours.  Recent Results (from the past 240 hour(s))  SARS Coronavirus 2 by RT PCR (hospital order, performed in [facility] hospital lab) Nasopharyngeal Nasopharyngeal Swab     Status: None   Collection Time: 04/24/20  4:03 PM   Specimen: Nasopharyngeal Swab  Result Value Ref Range Status   SARS Coronavirus 2 NEGATIVE NEGATIVE Final    Comment: (NOTE) SARS-CoV-2 target nucleic acids are NOT DETECTED.  The SARS-CoV-2 RNA is generally detectable in upper and lower respiratory specimens during the acute phase of infection. The lowest concentration of SARS-CoV-2 viral copies this  assay can detect is 250 copies / mL. A negative result does not preclude SARS-CoV-2 infection and should not be used as the sole basis for treatment or other patient management decisions.  A negative result may occur with improper specimen collection / handling, submission of specimen other than nasopharyngeal swab, presence of viral mutation(s) within the areas targeted by this assay, and inadequate number of viral copies (<250 copies / mL). A negative result must be combined with clinical observations, patient history, and epidemiological information.  Fact Sheet for Patients:   StrictlyIdeas.no  Fact Sheet for Healthcare Providers: BankingDealers.co.za  This test is not yet approved or  cleared by the [place] FDA and has been authorized for detection and/or diagnosis of SARS-CoV-2 by FDA under an Emergency Use Authorization (EUA).  This EUA will remain in effect (meaning this test can be used) for the duration of the COVID-19 declaration under Section 564(b)(1) of the Act, 21 U.S.C. section 360bbb-3(b)(1), unless the authorization is terminated or revoked sooner.  Performed at [facility], [address], [place], [place] [address]          Radiology  Studies: CT ABDOMEN PELVIS W CONTRAST  Result Date: 04/24/2020 CLINICAL DATA:  LEFT upper quadrant abdominal pain. EXAM: CT ABDOMEN AND PELVIS WITH CONTRAST TECHNIQUE: Multidetector CT imaging of the abdomen and pelvis was performed using the standard protocol following bolus administration of intravenous contrast. CONTRAST:  143mL OMNIPAQUE IOHEXOL 300 MG/ML  SOLN COMPARISON:  None. FINDINGS: Lower chest: 5 mm pleural based nodular density within the RIGHT lower lobe. Emphysematous blebs at the RIGHT lung base. Hepatobiliary: Scattered small hypodense foci throughout the bilateral liver lobes, largest within the posterior RIGHT liver lobe measuring 1.1 cm majority too small to definitively characterize largest suspicious for neoplastic/metastatic process. Gallstones. Questionable gallbladder wall thickening. No pericholecystic fluid. No bile duct dilatation seen Pancreas: Hypodense mass within the head/uncinate process of the pancreas, measuring 2.2 cm, highly suspicious for pancreatic neoplasm. No pancreatic duct dilatation seen. Spleen: Normal in size without focal abnormality. Adrenals/Urinary Tract: Adrenal glands appear normal. 1.4 cm mass within the lateral cortex of the RIGHT kidney, with probable enhancement centrally, suspicious for additional neoplastic mass, alternatively angiomyolipoma (axial series 2, image 31; coronal series 5, image 59). LEFT kidney appears. No hydronephrosis. No ureteral or bladder calculi identified. Bladder appears normal. Stomach/Bowel: No dilated large or small bowel loops. No evidence of bowel wall inflammation. Appendix is normal. Stomach is unremarkable, partially decompressed. Vascular/Lymphatic: Aortic atherosclerosis. No acute appearing vascular abnormality. Mildly prominent lymph nodes within the upper abdomen, just above the pancreatic head with short axis measurement of 11 mm and portacaval space with short axis measurement of 12 mm. No enlarged lymph nodes seen  elsewhere within the abdomen or pelvis. Reproductive: Prostate is unremarkable. Other: No free fluid or free intraperitoneal air. Musculoskeletal: Degenerative spondylosis of the thoracolumbar spine, mild to moderate in degree. No acute or suspicious osseous findings identified. IMPRESSION: 1. Hypodense mass within the pancreatic head/uncinate process, measuring 2.2 cm, highly suspicious for pancreatic neoplasm. Recommend further characterization with pancreatic protocol MRI. 2. Scattered small hypodense foci throughout the bilateral liver lobes, largest within the posterior RIGHT liver lobe measuring 1.1 cm, majority too small to definitively characterize but largest suspicious for neoplastic/metastatic process. 3. 1.4 cm mass within the lateral cortex of the RIGHT kidney, with probable enhancement centrally, suspicious for additional neoplastic mass, alternatively benign angiomyolipoma. Recommend further characterization with nonemergent renal MRI. 4. Mildly prominent lymph nodes within the upper abdomen, just  above the pancreatic head and portacaval space, suspicious for metastatic lymphadenopathy. 5. 5 mm pleural based nodular density within the RIGHT lower lobe, nonspecific but suspicious given the presumed pancreatic neoplasm, liver metastases and possible concomitant renal neoplasm. 6. Cholelithiasis. Questionable gallbladder wall thickening but no pericholecystic fluid to suggest acute cholecystitis. If any localizable RIGHT upper quadrant pain and/or clinical suspicion for acute cholecystitis, consider RIGHT upper quadrant ultrasound for further characterization. Aortic Atherosclerosis (ICD10-I70.0) and Emphysema (ICD10-J43.9). Electronically Signed   By: Franki Cabot M.D.   On: 04/24/2020 15:11        Scheduled Meds: . enoxaparin (LOVENOX) injection  40 mg Subcutaneous Q24H  . insulin aspart  0-15 Units Subcutaneous TID WC  . insulin aspart  0-5 Units Subcutaneous QHS    LOS: 1 day    Time  spent: 35 minutes    Cheick Suhr Darleen Crocker, DO [facility]  If 7PM-7AM, please contact night-coverage [email or web address] 04/25/2020, 12:47 PM

## 2020-04-26 ENCOUNTER — Encounter (HOSPITAL_COMMUNITY): Payer: Self-pay

## 2020-04-26 DIAGNOSIS — N2889 Other specified disorders of kidney and ureter: Secondary | ICD-10-CM

## 2020-04-26 DIAGNOSIS — K8689 Other specified diseases of pancreas: Secondary | ICD-10-CM

## 2020-04-26 DIAGNOSIS — Z89512 Acquired absence of left leg below knee: Secondary | ICD-10-CM

## 2020-04-26 DIAGNOSIS — R1010 Upper abdominal pain, unspecified: Principal | ICD-10-CM

## 2020-04-26 DIAGNOSIS — R739 Hyperglycemia, unspecified: Secondary | ICD-10-CM

## 2020-04-26 LAB — GLUCOSE, CAPILLARY
Glucose-Capillary: 157 mg/dL — ABNORMAL HIGH (ref 70–99)
Glucose-Capillary: 210 mg/dL — ABNORMAL HIGH (ref 70–99)

## 2020-04-26 LAB — CANCER ANTIGEN 19-9: CA 19-9: 12 U/mL (ref 0–35)

## 2020-04-26 MED ORDER — METFORMIN HCL 500 MG PO TABS: 500.0000 mg | ORAL_TABLET | Freq: Two times a day (BID) | ORAL | 11 refills | Status: DC

## 2020-04-26 MED ORDER — BLOOD GLUCOSE MONITOR KIT: PACK | 0 refills | Status: DC

## 2020-04-26 MED ORDER — ACETAMINOPHEN 325 MG PO TABS: 650.0000 mg | ORAL_TABLET | Freq: Four times a day (QID) | ORAL | Status: DC | PRN

## 2020-04-26 MED ORDER — HYDROCODONE-ACETAMINOPHEN 5-325 MG PO TABS: 1.0000 | ORAL_TABLET | Freq: Four times a day (QID) | ORAL | 0 refills | Status: DC | PRN

## 2020-04-26 MED ORDER — BISACODYL 5 MG PO TBEC: 5.0000 mg | DELAYED_RELEASE_TABLET | Freq: Every day | ORAL | 0 refills | Status: DC | PRN

## 2020-04-26 NOTE — Progress Notes (Signed)
I met with patient today prior to inpatient hospital discharge. I introduced myself and explained my role in the patient's care. I explained that the patient can expect follow-up phone calls to inform him of upcoming appointments for biopsy and office visit with Dr. Delton Coombes. Patient's family member inquired about potential genetics referral, stating that the patient has a family history of cervical cancer, impacting two sisters. Order placed for genetics referral. Patient and family were given the opportunity to ask questions and all were answered to their satisfaction. I provided the patient with my contact information and encouraged him to call with questions or concerns.

## 2020-04-26 NOTE — Care Management Important Message (Signed)
Important Message  Patient Details  Name: [name]. MRN: [id number] Date of Birth: 1951/09/04   Medicare Important Message Given:  Yes     Tommy Medal 04/26/2020, 11:31 AM

## 2020-04-26 NOTE — Progress Notes (Signed)
Inpatient Diabetes Program Recommendations  AACE/ADA: New Consensus Statement on Inpatient Glycemic Control (2015)  Target Ranges:  Prepandial:   less than 140 mg/dL      Peak postprandial:   less than 180 mg/dL (1-2 hours)      Critically ill patients:  140 - 180 mg/dL   Lab Results  Component Value Date   GLUCAP 210 (H) 04/26/2020   HGBA1C 10.8 (H) 04/24/2020    Review of Glycemic Control Results for [name], [name] (MRN [id number]) as of 04/26/2020 11:14  Ref. Range 04/25/2020 20:55 04/26/2020 07:35 04/26/2020 11:38  Glucose-Capillary Latest Ref Range: 70 - 99 mg/dL 159 (H) 157 (H) 210 (H)   Diabetes history: Type 2 DM Outpatient Diabetes medications: none Current orders for Inpatient glycemic control: Novolog 0-15 units TID, Novolog 0-5 units QHS  Inpatient Diabetes Program Recommendations:    Spoke with patient regarding outpatient diabetes management.  Reviewed patient's current A1c of 10.8%. Explained what a A1c is and what it measures. Also reviewed goal A1c with patient, importance of good glucose control @ home, and blood sugar goals. Reviewed patho of DM, need for improved control, impatient trends, vascular changes, impact of risk for repeated infection, Metformin benefits, and other commorbidities.  Patient will need a glucose meter. Reviewed recommended frequency of CBg checks and when to call MD. Reviewed at length normal target values and survival skills.  Patient admits to drinking large amounts of sweeten beverages, cookies and candy. Made suggestions for alternatives to beverages, snacks to include protein and stressed the importance of mindfulness.  Patient to follow up with PCP and has no further questions at this time.   Thanks, Bronson Curb, MSN, RNC-OB Diabetes Coordinator [phone] (8a-5p)

## 2020-04-26 NOTE — Progress Notes (Signed)
Nsg Discharge Note  Admit Date:  04/24/2020 Discharge date: 04/26/2020   John Islam Sr. to be D/C'd Home per MD order.  AVS completed.  Copy for chart, and copy for patient signed, and dated. Patient/caregiver able to verbalize understanding.  Discharge Medication: Allergies as of 04/26/2020      Reactions   Tea Itching, Rash      Medication List    TAKE these medications   acetaminophen 325 MG tablet Commonly known as: TYLENOL Take 2 tablets (650 mg total) by mouth every 6 (six) hours as needed for mild pain, moderate pain or fever.   aspirin 81 MG EC tablet Take 1 tablet (81 mg total) by mouth daily. What changed:   when to take this  reasons to take this   bisacodyl 5 MG EC tablet Commonly known as: DULCOLAX Take 1 tablet (5 mg total) by mouth daily as needed for moderate constipation.   blood glucose meter kit and supplies Kit Dispense based on patient and insurance preference. Use up to four times daily as directed. (FOR ICD-9 250.00, 250.01).   HYDROcodone-acetaminophen 5-325 MG tablet Commonly known as: NORCO/VICODIN Take 1-2 tablets by mouth every 6 (six) hours as needed for moderate pain or severe pain.   metFORMIN 500 MG tablet Commonly known as: Glucophage Take 1 tablet (500 mg total) by mouth 2 (two) times daily with a meal.       Discharge Assessment: Vitals:   04/25/20 2137 04/26/20 0501  BP: (!) 139/80 (!) 142/77  Pulse: 63 65  Resp: 16 16  Temp:    SpO2: 100% 99%   Skin clean, dry and intact without evidence of skin break down, no evidence of skin tears noted. IV catheter discontinued intact. Site without signs and symptoms of complications - no redness or edema noted at insertion site, patient denies c/o pain - only slight tenderness at site.  Dressing with slight pressure applied.  D/c Instructions-Education: Discharge instructions given to patient/family with verbalized understanding. D/c education completed with patient/family including  follow up instructions, medication list, d/c activities limitations if indicated, with other d/c instructions as indicated by MD - patient able to verbalize understanding, all questions fully answered. Patient instructed to return to ED, call 911, or call MD for any changes in condition.  Patient escorted via [place], and D/C home via private auto.  Loa Socks, RN 04/26/2020 1:00 PM

## 2020-04-26 NOTE — TOC Transition Note (Signed)
Transition of Care [facility]) - CM/SW Discharge Note   Patient Details  Name: John Rann Sr. MRN: [id number] Date of Birth: [birth year]  [facility] CM/SW Contact:  Breydon Senters, Chauncey Reading, RN Phone Number: 04/26/2020, 11:27 AM   Clinical Narrative:   Patient discharging home today. Will follow up with neurology, new PCP found and appointment scheduled.

## 2020-04-26 NOTE — Discharge Summary (Signed)
Physician Discharge Summary  John Dudash Sr. WGN:[id number] DOB: [birth year] DOA: 04/24/2020  PCP: Patient, No Pcp Per  Admit date: 04/24/2020  Discharge date: 04/26/2020  Admitted From:Home  Disposition:  Home  Recommendations for Outpatient Follow-up:  1. Follow up with PCP in 1-2 weeks, new PCP to be established by TOC 2. Follow-up with Dr. Delton Coombes for scheduling of biopsy and for follow-up of results with further evaluation and management at that point 3. Please obtain BMP/CBC in one week 4. Continue on Metformin 500 mg twice daily as prescribed and monitor blood glucose with blood glucose kit prescription provided.  He will need close follow-up for monitoring of blood glucose levels as A1c was 10.8% and he has been noncompliant with his blood glucose management for over the past year.  Blood glucose around 150 on discharge. 5. Continue on pain medications with Norco as provided with 30 tablets and 0 refills.  Further pain medications per oncology/PCP.  Home Health: None  Equipment/Devices: None  Discharge Condition: Stable  CODE STATUS: Full  Diet recommendation: Heart Healthy/carb modified  Brief/Interim Summary: Per HPI: Kamarii Carton Caseis an 69 y.o.malewith PMH significant for HTN, DM 2, PVD status post left BKA who was in his usual state of health until 2 to 3 weeks ago when he noted anorexia, food aversion and increasing epigastric pain. Over the past 3 to 4 days patient states that the epigastric pain has gotten worse "and sometimes it feels like my stomach is twisting around". Patient has never had symptoms like this before. Notes he usually has a good appetite. Patient also admits to constipation and has not had a bowel movement in a several days of days "but I have not been eating much anyway". Patient notes that yesterday he tried to drink some water but felt like his belly was bloated and felt uncomfortable.  Patient denies fevers or chills. He does admit  to feeling weak because he has had decreased p.o. intake. No dizziness, syncope or presyncope. No chest pain. No falls. Patient denies GI bleed as far as he knows. No melena. No nausea vomiting or hemoptysis.  Of note, patient states he is not on any medications for any of his medical conditions because he is not been able to find a physician in the past couple of years. He attributes this to Covid.  8/1: Patient with pain and only receiving IV morphine.  Plan to discontinue and see how well he does with oral narcotics for pain management.  He denies nausea or vomiting or abdominal pain.  Discussed case with oncology with CA 19-9 ordered.  Anticipate discharge tomorrow if stable.  8/2: Patient's abdominal pain has been well controlled with oral Norco as needed every 6 hours.  This prescription will be provided to him at time of discharge.  He has been seen by oncology with plans to coordinate biopsy in the outpatient setting as well as close follow-up.  I have discussed case with Dr. Delton Coombes there is no need to continue ongoing inpatient care as he is stable for discharge and is tolerating his diet with adequate pain control.  Blood glucose has stabilized as well and I have emphasized a carb modified diet on discharge with no concentrated sweets as well as blood glucose monitoring and use of Metformin.  PCP will also be set up on discharge.  CA 19-9 currently pending.  No other acute events noted throughout the course of this brief admission.  He is stable for discharge.  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  Hyperglycemia secondary to poorly controlled type 2 diabetes with hemoglobin A1c  10.8%.  Discharge Instructions  Discharge Instructions    Diet - low sodium heart healthy   Complete by: As directed    Diet Carb Modified   Complete by: As directed    Increase activity slowly   Complete by: As directed      Allergies as of 04/26/2020      Reactions   Tea Itching, Rash      Medication List    TAKE these medications   acetaminophen 325 MG tablet Commonly known as: TYLENOL Take 2 tablets (650 mg total) by mouth every 6 (six) hours as needed for mild pain, moderate pain or fever.   aspirin 81 MG EC tablet Take 1 tablet (81 mg total) by mouth daily. What changed:   when to take this  reasons to take this   bisacodyl 5 MG EC tablet Commonly known as: DULCOLAX Take 1 tablet (5 mg total) by mouth daily as needed for moderate constipation.   blood glucose meter kit and supplies Kit Dispense based on patient and insurance preference. Use up to four times daily as directed. (FOR ICD-9 250.00, 250.01).   HYDROcodone-acetaminophen 5-325 MG tablet Commonly known as: NORCO/VICODIN Take 1-2 tablets by mouth every 6 (six) hours as needed for moderate pain or severe pain.   metFORMIN 500 MG tablet Commonly known as: Glucophage Take 1 tablet (500 mg total) by mouth 2 (two) times daily with a meal.       Follow-up Information    Derek Jack, MD Follow up in 2 week(s).   Specialty: Hematology Contact information: [address] [place] [address] [phone]        pcp Follow up in 1 week(s).              Allergies  Allergen Reactions  . Tea Itching and Rash    Consultations:  Oncology-Dr. Delton Coombes   Procedures/Studies: CT ABDOMEN PELVIS W CONTRAST  Result Date: 04/24/2020 CLINICAL DATA:  LEFT upper quadrant abdominal pain. EXAM: CT ABDOMEN AND PELVIS WITH CONTRAST TECHNIQUE: Multidetector CT imaging of the abdomen and pelvis was performed using the standard protocol following bolus administration of intravenous contrast.  CONTRAST:  18m OMNIPAQUE IOHEXOL 300 MG/ML  SOLN COMPARISON:  None. FINDINGS: Lower chest: 5 mm pleural based nodular density within the RIGHT lower lobe. Emphysematous blebs at the RIGHT lung base. Hepatobiliary: Scattered small hypodense foci throughout the bilateral liver lobes, largest within the posterior RIGHT liver lobe measuring 1.1 cm majority too small to definitively characterize largest suspicious for neoplastic/metastatic process. Gallstones. Questionable gallbladder wall thickening. No pericholecystic fluid. No bile duct dilatation seen Pancreas: Hypodense mass within the head/uncinate process of the pancreas, measuring 2.2 cm, highly suspicious for pancreatic neoplasm. No pancreatic duct dilatation seen. Spleen: Normal in size without focal abnormality. Adrenals/Urinary Tract: Adrenal glands appear normal. 1.4 cm mass within the lateral cortex of the RIGHT kidney, with probable enhancement centrally, suspicious for additional neoplastic mass, alternatively angiomyolipoma (axial series 2, image 31; coronal series 5, image 59). LEFT kidney appears. No hydronephrosis. No ureteral or bladder calculi identified. Bladder appears normal. Stomach/Bowel: No dilated large  or small bowel loops. No evidence of bowel wall inflammation. Appendix is normal. Stomach is unremarkable, partially decompressed. Vascular/Lymphatic: Aortic atherosclerosis. No acute appearing vascular abnormality. Mildly prominent lymph nodes within the upper abdomen, just above the pancreatic head with short axis measurement of 11 mm and portacaval space with short axis measurement of 12 mm. No enlarged lymph nodes seen elsewhere within the abdomen or pelvis. Reproductive: Prostate is unremarkable. Other: No free fluid or free intraperitoneal air. Musculoskeletal: Degenerative spondylosis of the thoracolumbar spine, mild to moderate in degree. No acute or suspicious osseous findings identified. IMPRESSION: 1. Hypodense mass within the  pancreatic head/uncinate process, measuring 2.2 cm, highly suspicious for pancreatic neoplasm. Recommend further characterization with pancreatic protocol MRI. 2. Scattered small hypodense foci throughout the bilateral liver lobes, largest within the posterior RIGHT liver lobe measuring 1.1 cm, majority too small to definitively characterize but largest suspicious for neoplastic/metastatic process. 3. 1.4 cm mass within the lateral cortex of the RIGHT kidney, with probable enhancement centrally, suspicious for additional neoplastic mass, alternatively benign angiomyolipoma. Recommend further characterization with nonemergent renal MRI. 4. Mildly prominent lymph nodes within the upper abdomen, just above the pancreatic head and portacaval space, suspicious for metastatic lymphadenopathy. 5. 5 mm pleural based nodular density within the RIGHT lower lobe, nonspecific but suspicious given the presumed pancreatic neoplasm, liver metastases and possible concomitant renal neoplasm. 6. Cholelithiasis. Questionable gallbladder wall thickening but no pericholecystic fluid to suggest acute cholecystitis. If any localizable RIGHT upper quadrant pain and/or clinical suspicion for acute cholecystitis, consider RIGHT upper quadrant ultrasound for further characterization. Aortic Atherosclerosis (ICD10-I70.0) and Emphysema (ICD10-J43.9). Electronically Signed   By: Franki Cabot M.D.   On: 04/24/2020 15:11     Discharge Exam: Vitals:   04/25/20 2137 04/26/20 0501  BP: (!) 139/80 (!) 142/77  Pulse: 63 65  Resp: 16 16  Temp:    SpO2: 100% 99%   Vitals:   04/25/20 1419 04/25/20 2047 04/25/20 2137 04/26/20 0501  BP: (!) 136/74  (!) 139/80 (!) 142/77  Pulse: 59  63 65  Resp: _0 Temp: 98.3 F (36.8 C)     TempSrc: Oral     SpO2: 100% 94% 100% 99%  Weight:      Height:        General: Pt is alert, awake, not in acute distress Cardiovascular: RRR, S1/S2 +, no rubs, no gallops Respiratory: CTA  bilaterally, no wheezing, no rhonchi Abdominal: Soft, NT, ND, bowel sounds + Extremities: no edema, no cyanosis    The results of significant diagnostics from this hospitalization (including imaging, microbiology, ancillary and laboratory) are listed below for reference.     Microbiology: Recent Results (from the past 240 hour(s))  SARS Coronavirus 2 by RT PCR (hospital order, performed in [facility] hospital lab) Nasopharyngeal Nasopharyngeal Swab     Status: None   Collection Time: 04/24/20  4:03 PM   Specimen: Nasopharyngeal Swab  Result Value Ref Range Status   SARS Coronavirus 2 NEGATIVE NEGATIVE Final    Comment: (NOTE) SARS-CoV-2 target nucleic acids are NOT DETECTED.  The SARS-CoV-2 RNA is generally detectable in upper and lower respiratory specimens during the acute phase of infection. The lowest concentration of SARS-CoV-2 viral copies this assay can detect is 250 copies / mL. A negative result does not preclude SARS-CoV-2 infection and should not be used as the sole basis for treatment or other patient management decisions.  A negative result may occur with improper specimen collection / handling, submission  of specimen other than nasopharyngeal swab, presence of viral mutation(s) within the areas targeted by this assay, and inadequate number of viral copies (<250 copies / mL). A negative result must be combined with clinical observations, patient history, and epidemiological information.  Fact Sheet for Patients:   StrictlyIdeas.no  Fact Sheet for Healthcare Providers: BankingDealers.co.za  This test is not yet approved or  cleared by the [place] FDA and has been authorized for detection and/or diagnosis of SARS-CoV-2 by FDA under an Emergency Use Authorization (EUA).  This EUA will remain in effect (meaning this test can be used) for the duration of the COVID-19 declaration under Section 564(b)(1) of the Act,  21 U.S.C. section 360bbb-3(b)(1), unless the authorization is terminated or revoked sooner.  Performed at [facility], [address] [place], [place] [address]      Labs: BNP (last 3 results) No results for input(s): BNP in the last 8760 hours. Basic Metabolic Panel: Recent Labs  Lab 04/24/20 1207 04/25/20 0640  NA 133* 137  K 3.8 3.9  CL 101 106  CO2 20* 22  GLUCOSE 523* 258*  BUN 10 8  CREATININE 0.85 0.65  CALCIUM 8.7* 8.4*   Liver Function Tests: Recent Labs  Lab 04/24/20 1207  AST 26  ALT 18  ALKPHOS 122  BILITOT 0.8  PROT 7.0  ALBUMIN 3.4*   Recent Labs  Lab 04/24/20 1207  LIPASE 923*   No results for input(s): AMMONIA in the last 168 hours. CBC: Recent Labs  Lab 04/24/20 1207 04/25/20 0640  WBC 9.3 10.2  HGB 13.8 13.3  HCT 43.2 41.0  MCV 85.4 85.6  PLT 257 217   Cardiac Enzymes: No results for input(s): CKTOTAL, CKMB, CKMBINDEX, TROPONINI in the last 168 hours. BNP: Invalid input(s): POCBNP CBG: Recent Labs  Lab 04/25/20 0750 04/25/20 1124 04/25/20 1607 04/25/20 2055 04/26/20 0735  GLUCAP 228* 231* 177* 159* 157*   D-Dimer No results for input(s): DDIMER in the last 72 hours. Hgb A1c Recent Labs    04/24/20 1207  HGBA1C 10.8*   Lipid Profile No results for input(s): CHOL, HDL, LDLCALC, TRIG, CHOLHDL, LDLDIRECT in the last 72 hours. Thyroid function studies No results for input(s): TSH, T4TOTAL, T3FREE, THYROIDAB in the last 72 hours.  Invalid input(s): FREET3 Anemia work up No results for input(s): VITAMINB12, FOLATE, FERRITIN, TIBC, IRON, RETICCTPCT in the last 72 hours. Urinalysis    Component Value Date/Time   COLORURINE YELLOW 04/24/2020 1251   APPEARANCEUR CLEAR 04/24/2020 1251   LABSPEC 1.036 (H) 04/24/2020 1251   PHURINE 5.0 04/24/2020 1251   GLUCOSEU >=500 (A) 04/24/2020 1251   HGBUR NEGATIVE 04/24/2020 1251   BILIRUBINUR NEGATIVE 04/24/2020 1251   KETONESUR NEGATIVE 04/24/2020 1251   PROTEINUR NEGATIVE  04/24/2020 1251   NITRITE NEGATIVE 04/24/2020 1251   LEUKOCYTESUR NEGATIVE 04/24/2020 1251   Sepsis Labs Invalid input(s): PROCALCITONIN,  WBC,  LACTICIDVEN Microbiology Recent Results (from the past 240 hour(s))  SARS Coronavirus 2 by RT PCR (hospital order, performed in [place] hospital lab) Nasopharyngeal Nasopharyngeal Swab     Status: None   Collection Time: 04/24/20  4:03 PM   Specimen: Nasopharyngeal Swab  Result Value Ref Range Status   SARS Coronavirus 2 NEGATIVE NEGATIVE Final    Comment: (NOTE) SARS-CoV-2 target nucleic acids are NOT DETECTED.  The SARS-CoV-2 RNA is generally detectable in upper and lower respiratory specimens during the acute phase of infection. The lowest concentration of SARS-CoV-2 viral copies this assay can detect is 250 copies /  mL. A negative result does not preclude SARS-CoV-2 infection and should not be used as the sole basis for treatment or other patient management decisions.  A negative result may occur with improper specimen collection / handling, submission of specimen other than nasopharyngeal swab, presence of viral mutation(s) within the areas targeted by this assay, and inadequate number of viral copies (<250 copies / mL). A negative result must be combined with clinical observations, patient history, and epidemiological information.  Fact Sheet for Patients:   StrictlyIdeas.no  Fact Sheet for Healthcare Providers: BankingDealers.co.za  This test is not yet approved or  cleared by the [place] FDA and has been authorized for detection and/or diagnosis of SARS-CoV-2 by FDA under an Emergency Use Authorization (EUA).  This EUA will remain in effect (meaning this test can be used) for the duration of the COVID-19 declaration under Section 564(b)(1) of the Act, 21 U.S.C. section 360bbb-3(b)(1), unless the authorization is terminated or revoked sooner.  Performed at [facility], [address] [place], [place] [address]      Time coordinating discharge: 35 minutes  SIGNED:   Rodena Goldmann, DO [facility] 04/26/2020, 10:56 AM  If 7PM-7AM, please contact night-coverage [email or web address]

## 2020-04-26 NOTE — Consult Note (Signed)
[facility] Consultation Oncology  Name: John Musto Sr.      MRN: [id number]    Location: A335/A335-01  Date: 04/26/2020 Time:7:46 AM   REFERRING PHYSICIAN: Dr. Manuella Ghazi  REASON FOR CONSULT: Pancreatic mass with liver lesions   DIAGNOSIS: Highly likely metastatic pancreatic cancer.  HISTORY OF PRESENT ILLNESS: Mr. [name] is a 69 year old very pleasant white male who is seen in consultation today for further work-up and management of pancreatic mass.  He presented to the ER with 2 to 3-week history of bilateral upper quadrant abdominal pain which is constant and nagging in nature.  He also developed loss of appetite and loss of taste.  He is not sure if he has lost any weight in the last 2 to 3 weeks.  He lives at home with his wife.  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  He also has poorly controlled diabetes.  He has not seen any physician for diabetes control in the last 1 and half year.  He denies any prior history of malignancies.  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  He smokes about 1 pack/day of cigarettes for 47 years.  He denies drinking alcohol.  In the ER he had a CT scan of the abdomen and pelvis with contrast which showed hypodense mass in the pancreatic head measuring about 2.2 cm suspicious for neoplasm.  Scattered small hypodense lesions in the liver, predominantly in the right lobe.  There is also a 1.4 cm mass in the lateral cortex of the right kidney along with mild prominent lymph nodes in the upper abdomen.  He is currently taking Norco 5/325 every 4 hours as needed.  Denies any family history of malignancies although he does not know much about his father's side of the family.  PAST MEDICAL HISTORY:   Past Medical History:  Diagnosis Date   Arthritis    Diabetes mellitus without complication (HCC)    Emesis    Heart murmur    Hyperglycemia    Hypertension    Joint pain      ALLERGIES: Allergies  Allergen Reactions   Tea Itching and Rash      MEDICATIONS: I have reviewed the patient's current medications.     PAST SURGICAL HISTORY Past Surgical History:  Procedure Laterality Date   AMPUTATION Left 08/02/2018   Procedure: LEFT AMPUTATION BELOW KNEE;  Surgeon: Marty Heck, MD;  Location: [place];  Service: Vascular;  Laterality: Left;   ELBOW SURGERY     Bone graft to repair elbow   LOWER EXTREMITY ANGIOGRAPHY N/A 07/24/2018   Procedure: LOWER EXTREMITY ANGIOGRAPHY;  Surgeon: Marty Heck, MD;  Location: [place] [facility];  Service: Cardiovascular;  Laterality: N/A;   PERIPHERAL VASCULAR INTERVENTION  07/24/2018   Procedure: PERIPHERAL VASCULAR INTERVENTION;  Surgeon: Marty Heck, MD;  Location: [place] [facility];  Service: Cardiovascular;;   TONSILLECTOMY     TRANSMETATARSAL AMPUTATION Left 07/25/2018   Procedure: LEFT GREAT TOE AMPUTATION;  Surgeon: Marty Heck, MD;  Location: [place];  Service: Vascular;  Laterality: Left;   TRANSMETATARSAL AMPUTATION Left 07/30/2018   Procedure: TRANSMETATARSAL AMPUTATION;  Surgeon: Waynetta Sandy, MD;  Location: [place];  Service: Vascular;  Laterality: Left;    FAMILY HISTORY: Family History  Problem Relation Age of Onset   Cancer Sister     SOCIAL HISTORY:  reports that he has been smoking cigarettes. He  has been smoking about 0.50 packs per day. He quit smokeless tobacco use about 31 years ago.  His smokeless tobacco use included snuff. He reports current alcohol use of about 12.0 standard drinks of alcohol per week. He reports that he does not use drugs.  PERFORMANCE STATUS: The patient's performance status is 1 - Symptomatic but completely ambulatory  PHYSICAL EXAM: Most Recent Vital Signs: Blood pressure (!) 142/77, pulse 65, temperature 98.3 F (36.8 C), temperature source Oral, resp. rate 16, height 5\' 11"  (1.803 m), weight 148 lb 2.4 oz (67.2 kg),  SpO2 99 %. BP (!) 142/77 (BP Location: Left Arm)    Pulse 65    Temp 98.3 F (36.8 C) (Oral)    Resp 16    Ht 5\' 11"  (1.803 m)    Wt 148 lb 2.4 oz (67.2 kg)    SpO2 99%    BMI 20.66 kg/m  General appearance: alert, cooperative and appears stated age Neck: no adenopathy Lungs: clear to auscultation bilaterally Heart: regular rate and rhythm Abdomen: Soft, tenderness in bilateral upper quadrants and mild tenderness in the epigastric region.  No masses palpable. Extremities: Right leg has no edema.  Left BKA stump is within normal limits. Skin: Skin color, texture, turgor normal. No rashes or lesions Lymph nodes: Cervical, supraclavicular, and axillary nodes normal. Neurologic: Grossly normal  LABORATORY DATA:  Results for orders placed or performed during the hospital encounter of 04/24/20 (from the past 48 hour(s))  Lipase, blood     Status: Abnormal   Collection Time: 04/24/20 12:07 PM  Result Value Ref Range   Lipase 923 (H) 11 - 51 U/L    Comment: RESULTS CONFIRMED BY MANUAL DILUTION Performed at [facility], [address], [place], [place] [address]   Comprehensive metabolic panel     Status: Abnormal   Collection Time: 04/24/20 12:07 PM  Result Value Ref Range   Sodium 133 (L) 135 - 145 mmol/L   Potassium 3.8 3.5 - 5.1 mmol/L   Chloride 101 98 - 111 mmol/L   CO2 20 (L) 22 - 32 mmol/L   Glucose, Bld 523 (HH) 70 - 99 mg/dL    Comment: Glucose reference range applies only to samples taken after fasting for at least 8 hours. CRITICAL RESULT CALLED TO, READ BACK BY AND VERIFIED WITH: TUTTLE @ 1326 ON [id number] BY HENDERSON L.    BUN 10 8 - 23 mg/dL   Creatinine, Ser 0.85 0.61 - 1.24 mg/dL   Calcium 8.7 (L) 8.9 - 10.3 mg/dL   Total Protein 7.0 6.5 - 8.1 g/dL   Albumin 3.4 (L) 3.5 - 5.0 g/dL   AST 26 15 - 41 U/L   ALT 18 0 - 44 U/L   Alkaline Phosphatase 122 38 - 126 U/L   Total Bilirubin 0.8 0.3 - 1.2 mg/dL   GFR calc non Af Amer >60 >60 mL/min   GFR calc Af Amer >60 >60  mL/min   Anion gap 12 5 - 15    Comment: Performed at [facility], [address] [place], [place] [address]  CBC     Status: None   Collection Time: 04/24/20 12:07 PM  Result Value Ref Range   WBC 9.3 4.0 - 10.5 K/uL   RBC 5.06 4.22 - 5.81 MIL/uL   Hemoglobin 13.8 13.0 - 17.0 g/dL   HCT 43.2 39 - 52 %   MCV 85.4 80.0 - 100.0 fL   MCH 27.3 26.0 - 34.0 pg   MCHC 31.9 30.0 -  36.0 g/dL   RDW 15.0 11.5 - 15.5 %   Platelets 257 150 - 400 K/uL   nRBC 0.0 0.0 - 0.2 %    Comment: Performed at [facility], [address], [place], [place] [address]  Troponin I (High Sensitivity)     Status: None   Collection Time: 04/24/20 12:07 PM  Result Value Ref Range   Troponin I (High Sensitivity) 11 <18 ng/L    Comment: (NOTE) Elevated high sensitivity troponin I (hsTnI) values and significant  changes across serial measurements may suggest ACS but many other  chronic and acute conditions are known to elevate hsTnI results.  Refer to the "Links" section for chest pain algorithms and additional  guidance. Performed at [facility], [address] [place], [place] [address]   HIV Antibody (routine testing w rflx)     Status: None   Collection Time: 04/24/20 12:07 PM  Result Value Ref Range   HIV Screen 4th Generation wRfx Non Reactive Non Reactive    Comment: Performed at [facility], [place] [address] [place], [place] [address]  Hemoglobin A1c     Status: Abnormal   Collection Time: 04/24/20 12:07 PM  Result Value Ref Range   Hgb A1c MFr Bld 10.8 (H) 4.8 - 5.6 %    Comment: (NOTE) Pre diabetes:          5.7%-6.4%  Diabetes:              >6.4%  Glycemic control for   <7.0% adults with diabetes    Mean Plasma Glucose 263.26 mg/dL    Comment: Performed at [facility] [address] [place], [place] [address]  Urinalysis, Routine w reflex microscopic     Status: Abnormal   Collection Time: 04/24/20 12:51 PM  Result Value Ref Range   Color, Urine YELLOW YELLOW   APPearance  CLEAR CLEAR   Specific Gravity, Urine 1.036 (H) 1.005 - 1.030   pH 5.0 5.0 - 8.0   Glucose, UA >=500 (A) NEGATIVE mg/dL   Hgb urine dipstick NEGATIVE NEGATIVE   Bilirubin Urine NEGATIVE NEGATIVE   Ketones, ur NEGATIVE NEGATIVE mg/dL   Protein, ur NEGATIVE NEGATIVE mg/dL   Nitrite NEGATIVE NEGATIVE   Leukocytes,Ua NEGATIVE NEGATIVE   RBC / HPF 0-5 0 - 5 RBC/hpf   Bacteria, UA NONE SEEN NONE SEEN   Mucus PRESENT     Comment: Performed at [facility], [address], [place], [place] [address]  Troponin I (High Sensitivity)     Status: None   Collection Time: 04/24/20  2:20 PM  Result Value Ref Range   Troponin I (High Sensitivity) 12 <18 ng/L    Comment: (NOTE) Elevated high sensitivity troponin I (hsTnI) values and significant  changes across serial measurements may suggest ACS but many other  chronic and acute conditions are known to elevate hsTnI results.  Refer to the "Links" section for chest pain algorithms and additional  guidance. Performed at [facility], [address] [place], [place] [address]   SARS Coronavirus 2 by RT PCR (hospital order, performed in [facility] hospital lab) Nasopharyngeal Nasopharyngeal Swab     Status: None   Collection Time: 04/24/20  4:03 PM   Specimen: Nasopharyngeal Swab  Result Value Ref Range   SARS Coronavirus 2 NEGATIVE NEGATIVE    Comment: (NOTE) SARS-CoV-2 target nucleic acids are NOT DETECTED.  The SARS-CoV-2 RNA is generally detectable in upper and lower respiratory specimens during the acute phase of infection.  The lowest concentration of SARS-CoV-2 viral copies this assay can detect is 250 copies / mL. A negative result does not preclude SARS-CoV-2 infection and should not be used as the sole basis for treatment or other patient management decisions.  A negative result may occur with improper specimen collection / handling, submission of specimen other than nasopharyngeal swab, presence of viral mutation(s) within the areas  targeted by this assay, and inadequate number of viral copies (<250 copies / mL). A negative result must be combined with clinical observations, patient history, and epidemiological information.  Fact Sheet for Patients:   StrictlyIdeas.no  Fact Sheet for Healthcare Providers: BankingDealers.co.za  This test is not yet approved or  cleared by the [place] FDA and has been authorized for detection and/or diagnosis of SARS-CoV-2 by FDA under an Emergency Use Authorization (EUA).  This EUA will remain in effect (meaning this test can be used) for the duration of the COVID-19 declaration under Section 564(b)(1) of the Act, 21 U.S.C. section 360bbb-3(b)(1), unless the authorization is terminated or revoked sooner.  Performed at [facility], [address] [place], [place] [address]   Glucose, capillary     Status: Abnormal   Collection Time: 04/24/20  8:17 PM  Result Value Ref Range   Glucose-Capillary 113 (H) 70 - 99 mg/dL    Comment: Glucose reference range applies only to samples taken after fasting for at least 8 hours.  Basic metabolic panel     Status: Abnormal   Collection Time: 04/25/20  6:40 AM  Result Value Ref Range   Sodium 137 135 - 145 mmol/L   Potassium 3.9 3.5 - 5.1 mmol/L   Chloride 106 98 - 111 mmol/L   CO2 22 22 - 32 mmol/L   Glucose, Bld 258 (H) 70 - 99 mg/dL    Comment: Glucose reference range applies only to samples taken after fasting for at least 8 hours.   BUN 8 8 - 23 mg/dL   Creatinine, Ser 0.65 0.61 - 1.24 mg/dL   Calcium 8.4 (L) 8.9 - 10.3 mg/dL   GFR calc non Af Amer >60 >60 mL/min   GFR calc Af Amer >60 >60 mL/min   Anion gap 9 5 - 15    Comment: Performed at [facility], [address], [place], [place] [address]  CBC     Status: None   Collection Time: 04/25/20  6:40 AM  Result Value Ref Range   WBC 10.2 4.0 - 10.5 K/uL   RBC 4.79 4.22 - 5.81 MIL/uL   Hemoglobin 13.3 13.0 - 17.0 g/dL   HCT  41.0 39 - 52 %   MCV 85.6 80.0 - 100.0 fL   MCH 27.8 26.0 - 34.0 pg   MCHC 32.4 30.0 - 36.0 g/dL   RDW 15.0 11.5 - 15.5 %   Platelets 217 150 - 400 K/uL   nRBC 0.0 0.0 - 0.2 %    Comment: Performed at [facility], [address], [place], [place] [address]  Glucose, capillary     Status: Abnormal   Collection Time: 04/25/20  7:50 AM  Result Value Ref Range   Glucose-Capillary 228 (H) 70 - 99 mg/dL    Comment: Glucose reference range applies only to samples taken after fasting for at least 8 hours.   Comment 1 Notify RN   Glucose, capillary     Status: Abnormal   Collection Time: 04/25/20 11:24 AM  Result Value Ref Range   Glucose-Capillary 231 (H) 70 - 99 mg/dL  Comment: Glucose reference range applies only to samples taken after fasting for at least 8 hours.   Comment 1 Notify RN   Glucose, capillary     Status: Abnormal   Collection Time: 04/25/20  4:07 PM  Result Value Ref Range   Glucose-Capillary 177 (H) 70 - 99 mg/dL    Comment: Glucose reference range applies only to samples taken after fasting for at least 8 hours.   Comment 1 Notify RN   Glucose, capillary     Status: Abnormal   Collection Time: 04/25/20  8:55 PM  Result Value Ref Range   Glucose-Capillary 159 (H) 70 - 99 mg/dL    Comment: Glucose reference range applies only to samples taken after fasting for at least 8 hours.  Glucose, capillary     Status: Abnormal   Collection Time: 04/26/20  7:35 AM  Result Value Ref Range   Glucose-Capillary 157 (H) 70 - 99 mg/dL    Comment: Glucose reference range applies only to samples taken after fasting for at least 8 hours.   Comment 1 Notify RN       RADIOGRAPHY: CT ABDOMEN PELVIS W CONTRAST  Result Date: 04/24/2020 CLINICAL DATA:  LEFT upper quadrant abdominal pain. EXAM: CT ABDOMEN AND PELVIS WITH CONTRAST TECHNIQUE: Multidetector CT imaging of the abdomen and pelvis was performed using the standard protocol following bolus administration of intravenous contrast.  CONTRAST:  18mL OMNIPAQUE IOHEXOL 300 MG/ML  SOLN COMPARISON:  None. FINDINGS: Lower chest: 5 mm pleural based nodular density within the RIGHT lower lobe. Emphysematous blebs at the RIGHT lung base. Hepatobiliary: Scattered small hypodense foci throughout the bilateral liver lobes, largest within the posterior RIGHT liver lobe measuring 1.1 cm majority too small to definitively characterize largest suspicious for neoplastic/metastatic process. Gallstones. Questionable gallbladder wall thickening. No pericholecystic fluid. No bile duct dilatation seen Pancreas: Hypodense mass within the head/uncinate process of the pancreas, measuring 2.2 cm, highly suspicious for pancreatic neoplasm. No pancreatic duct dilatation seen. Spleen: Normal in size without focal abnormality. Adrenals/Urinary Tract: Adrenal glands appear normal. 1.4 cm mass within the lateral cortex of the RIGHT kidney, with probable enhancement centrally, suspicious for additional neoplastic mass, alternatively angiomyolipoma (axial series 2, image 31; coronal series 5, image 59). LEFT kidney appears. No hydronephrosis. No ureteral or bladder calculi identified. Bladder appears normal. Stomach/Bowel: No dilated large or small bowel loops. No evidence of bowel wall inflammation. Appendix is normal. Stomach is unremarkable, partially decompressed. Vascular/Lymphatic: Aortic atherosclerosis. No acute appearing vascular abnormality. Mildly prominent lymph nodes within the upper abdomen, just above the pancreatic head with short axis measurement of 11 mm and portacaval space with short axis measurement of 12 mm. No enlarged lymph nodes seen elsewhere within the abdomen or pelvis. Reproductive: Prostate is unremarkable. Other: No free fluid or free intraperitoneal air. Musculoskeletal: Degenerative spondylosis of the thoracolumbar spine, mild to moderate in degree. No acute or suspicious osseous findings identified. IMPRESSION: 1. Hypodense mass within the  pancreatic head/uncinate process, measuring 2.2 cm, highly suspicious for pancreatic neoplasm. Recommend further characterization with pancreatic protocol MRI. 2. Scattered small hypodense foci throughout the bilateral liver lobes, largest within the posterior RIGHT liver lobe measuring 1.1 cm, majority too small to definitively characterize but largest suspicious for neoplastic/metastatic process. 3. 1.4 cm mass within the lateral cortex of the RIGHT kidney, with probable enhancement centrally, suspicious for additional neoplastic mass, alternatively benign angiomyolipoma. Recommend further characterization with nonemergent renal MRI. 4. Mildly prominent lymph nodes within the upper abdomen, just above the  pancreatic head and portacaval space, suspicious for metastatic lymphadenopathy. 5. 5 mm pleural based nodular density within the RIGHT lower lobe, nonspecific but suspicious given the presumed pancreatic neoplasm, liver metastases and possible concomitant renal neoplasm. 6. Cholelithiasis. Questionable gallbladder wall thickening but no pericholecystic fluid to suggest acute cholecystitis. If any localizable RIGHT upper quadrant pain and/or clinical suspicion for acute cholecystitis, consider RIGHT upper quadrant ultrasound for further characterization. Aortic Atherosclerosis (ICD10-I70.0) and Emphysema (ICD10-J43.9). Electronically Signed   By: Franki Cabot M.D.   On: 04/24/2020 15:11         ASSESSMENT and PLAN:  1.  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 -Current active smoker, 1 pack/day for 47 years.  No history of alcohol use.  No family history of malignancy. -CT AP with contrast on 04/24/2020 shows 2.2 cm pancreatic head mass, small hypodense lesions throughout the bilateral lower lobes, predominantly in the right lower lobe, largest measuring 1.1 cm.  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  Mildly prominent lymph nodes within the upper abdomen above the pancreas head suspicious for metastatic lymphadenopathy.  5 mm pleural-based right lower lobe nodule. -CA 19-9 level is pending. -He will need biopsy of the liver lesion by IR to establish diagnosis. -I will discuss with Dr. Manuella Ghazi about the best way to get the biopsy.  Patient reports that his daughter might be able to take him to [facility] if we establish this as outpatient. -I have stressed to him the importance of following up with [place] upon discharge, as he has not seen his primary doctor since his left BKA. -To complete the work-up, he will also need CT of the chest with contrast. -We will make a referral to geneticist for further evaluation upon diagnosis confirmed.  2.  Poorly controlled diabetes: -Hemoglobin A1c was 10.8.  Is currently managed with NovoLog insulin. -He has not been seeing his doctor for the last 1 and half to 2 years.  3.  Abdominal pain: -Highly likely malignancy related. -He is currently on hydrocodone 5/325 1 to 2 tablets every 4 hours as needed.  Patient reports that he still has pain but manageable.  4.  Right kidney mass: -No further work-up needed if biopsy of liver lesion confirms pancreatic adenocarcinoma.  All questions were answered. The patient knows to call the clinic with any problems, questions or concerns. We can certainly see the patient much sooner if necessary.    Derek Jack

## 2020-04-27 ENCOUNTER — Telehealth (HOSPITAL_COMMUNITY): Payer: Self-pay

## 2020-04-27 ENCOUNTER — Encounter (HOSPITAL_COMMUNITY): Payer: Self-pay

## 2020-04-27 NOTE — Progress Notes (Signed)
John Islam Sr. Male, 69 y.o., Nov 04, 1950 MRN:  [id number] Phone:  [phone] (H) PCP:  Patient, No Pcp Per Primary Cvg:  Medicare/Medicare Part A And B Next Appt With Radiology ([facility]-US 2) 05/03/2020 at 1:00 PM  RE: Biopsy Received: Today Message Details  Arne Cleveland, MD  [name]   [place] core liver lesion   DDH   Previous Messages  ----- Message -----  From: Lenore Cordia  Sent: 04/26/2020  3:40 PM EDT  To: Ir Procedure Requests  Subject: Biopsy                      Procedure Requested: CT Biopsy    Reason for Procedure: Liver Lesion    Provider Requesting: Dr Delton Coombes  Provider Telephone: [phone]    Other Info: Rad exam in Epic

## 2020-04-29 ENCOUNTER — Inpatient Hospital Stay: Payer: Medicare Other

## 2020-04-29 ENCOUNTER — Other Ambulatory Visit: Payer: Self-pay | Admitting: Genetic Counselor

## 2020-04-29 ENCOUNTER — Inpatient Hospital Stay: Payer: Medicare Other | Attending: Family Medicine | Admitting: Genetic Counselor

## 2020-04-29 ENCOUNTER — Other Ambulatory Visit: Payer: Self-pay

## 2020-04-29 DIAGNOSIS — K8689 Other specified diseases of pancreas: Secondary | ICD-10-CM

## 2020-04-29 DIAGNOSIS — Z8049 Family history of malignant neoplasm of other genital organs: Secondary | ICD-10-CM | POA: Diagnosis not present

## 2020-04-30 ENCOUNTER — Other Ambulatory Visit: Payer: Self-pay | Admitting: Student

## 2020-04-30 ENCOUNTER — Other Ambulatory Visit: Payer: Self-pay | Admitting: Radiology

## 2020-04-30 ENCOUNTER — Encounter: Payer: Self-pay | Admitting: Genetic Counselor

## 2020-04-30 DIAGNOSIS — Z8049 Family history of malignant neoplasm of other genital organs: Secondary | ICD-10-CM | POA: Insufficient documentation

## 2020-04-30 LAB — GENETIC SCREENING ORDER

## 2020-04-30 NOTE — Progress Notes (Signed)
REFERRING PROVIDER: Derek Jack, MD [address] [place],  [place] [address]  PRIMARY PROVIDER:  Patient, No Pcp Per  PRIMARY REASON FOR VISIT:  1. Pancreatic mass   2. Family history of cervical cancer      HISTORY OF PRESENT ILLNESS:   [name], a 69 y.o. male, was seen for a [facility] cancer genetics consultation at the request of Dr. Delton Coombes due to a personal history of likely cancer and a family history of cancer.  [name] presents to clinic today to discuss the possibility of a hereditary predisposition to cancer, genetic testing, and to further clarify his future cancer risks, as well as potential cancer risks for family members.   Imaging on 04/24/2020 revealed a pancreatic mass that is concerning for pancreatic cancer. A liver biopsy has been scheduled for 05/03/2020 to establish the diagnosis. He does not have any prior history of cancer, and has not had a colonoscopy.   Past Medical History:  Diagnosis Date  . Arthritis   . Diabetes mellitus without complication ([facility])   . Emesis   . Family history of cervical cancer   . Heart murmur   . Hyperglycemia   . Hypertension   . Joint pain     Past Surgical History:  Procedure Laterality Date  . AMPUTATION Left 08/02/2018   Procedure: LEFT AMPUTATION BELOW KNEE;  Surgeon: Marty Heck, MD;  Location: [facility];  Service: Vascular;  Laterality: Left;  . ELBOW SURGERY     Bone graft to repair elbow  . LOWER EXTREMITY ANGIOGRAPHY N/A 07/24/2018   Procedure: LOWER EXTREMITY ANGIOGRAPHY;  Surgeon: Marty Heck, MD;  Location: [facility];  Service: Cardiovascular;  Laterality: N/A;  . PERIPHERAL VASCULAR INTERVENTION  07/24/2018   Procedure: PERIPHERAL VASCULAR INTERVENTION;  Surgeon: Marty Heck, MD;  Location: [facility];  Service: Cardiovascular;;  . TONSILLECTOMY    . TRANSMETATARSAL AMPUTATION Left 07/25/2018   Procedure: LEFT GREAT TOE AMPUTATION;  Surgeon: Marty Heck, MD;  Location: [facility];  Service: Vascular;  Laterality: Left;  . TRANSMETATARSAL AMPUTATION Left 07/30/2018   Procedure: TRANSMETATARSAL AMPUTATION;  Surgeon: Waynetta Sandy, MD;  Location: [facility];  Service: Vascular;  Laterality: Left;    Social History   Socioeconomic History  . Marital status: Married    Spouse name: Not on file  . Number of children: Not on file  . Years of education: Not on file  . Highest education level: Not on file  Occupational History  . Not on file  Tobacco Use  . Smoking status: Current Every Day Smoker    Packs/day: 0.50    Types: Cigarettes  . Smokeless tobacco: Former Systems developer    Types: Snuff    Quit date: 04/21/1989  Vaping Use  . Vaping Use: Never used  Substance and Sexual Activity  . Alcohol use: Yes    Alcohol/week: 12.0 standard drinks    Types: 12 Cans of beer per week  . Drug use: No  . Sexual activity: Not Currently  Other Topics Concern  . Not on file  Social History Narrative  . Not on file   Social Determinants of Health   Financial Resource Strain:   . Difficulty of Paying Living Expenses:   Food Insecurity:   . Worried About Charity fundraiser in the Last Year:   . Arboriculturist in the Last Year:   Transportation Needs:   . Film/video editor (Medical):   Marland Kitchen  Lack of Transportation (Non-Medical):   Physical Activity:   . Days of Exercise per Week:   . Minutes of Exercise per Session:   Stress:   . Feeling of Stress :   Social Connections:   . Frequency of Communication with Friends and Family:   . Frequency of Social Gatherings with Friends and Family:   . Attends Religious Services:   . Active Member of Clubs or Organizations:   . Attends Archivist Meetings:   Marland Kitchen Marital Status:      FAMILY HISTORY:  We obtained a detailed, 4-generation family history.  Significant diagnoses are listed below: Family History  Problem Relation Age of Onset  . Alcoholism Mother   . Alcoholism  Father   . Cervical cancer Sister        dx. late 70s  . Cervical cancer Sister        dx. late 29s   [name] has three daughters and one son, ranging in age from 33 to 5. He has 7 brothers and 4 sisters - one brother and one sister are maternal half-siblings. Two of his full sisters have had cervical cancer, both diagnosed in their 14s.   [name] mother died older than 42 and did not have cancer. He had two maternal aunts and one maternal uncle. One aunt died younger than 59, although he does not know the cause of her death. His maternal grandmother died at the age of 38 from old age, and his maternal grandfather died at the age of 11. There is no known cancer on the maternal side of the family, although he has limited information about these family members.  [name] father died when he was older than 56 and did not have cancer. [name] does not have any information about his paternal side of the family.  [name] is unaware of previous family history of genetic testing for hereditary cancer risks. Patient's maternal ancestors are of Native American descent, and paternal ancestors are of Native [place] and [place] descent. There is no reported Ashkenazi Jewish ancestry. There is no known consanguinity.  GENETIC COUNSELING ASSESSMENT: [name] is a 69 y.o. male with a personal history of likely pancreatic cancer, which, if confirmed, is somewhat suggestive of a hereditary cancer syndrome and predisposition to cancer. We, therefore, discussed and recommended the following at today's visit.   DISCUSSION:  We discussed that, in general, most cancer is not inherited in families, but instead is sporadic or familial. Sporadic cancers occur by chance and typically happen at older ages (>50 years) as this type of cancer is caused by genetic changes acquired during an individual's lifetime. Some families have more cancers than would be expected by chance; however, the ages or  types of cancer are not consistent with a known genetic mutation or known genetic mutations have been ruled out. This type of familial cancer is thought to be due to a combination of multiple genetic, environmental, hormonal, and lifestyle factors. While this combination of factors likely increases the risk of cancer, the exact source of this risk is not currently identifiable or testable.    We discussed that approximately 2-5% of unseleected cases of pancreatic cancer (regardless of family history) is hereditary, meaning that it is due to a mutation in a single gene that is passed down from generation to generation in a family. Most hereditary cases of pancreatic cancer are associated with the BRCA1 and BRCA2 genes, although there are other genetic conditions that can  be associated an increased risk for pancreatic cancer. We discussed that testing can be beneficial for several reasons, including knowing about other cancer risks, identifying potential screening and risk-reduction options that may be appropriate, identifying whether targeted treatment options such as PARP inhibitors would be beneficial, and to understand if other family members could be at risk for cancer and allow them to undergo genetic testing.   We reviewed the characteristics, features and inheritance patterns of hereditary cancer syndromes. We discussed that if his upcoming biopsy reveals pancreatic cancer, then genetic testing would be recommended. We reviewed genetic testing, including the appropriate family members to test, the process of testing, and insurance coverage. We discussed the implications of a negative, positive and/or variant of uncertain significant result.  We will wait to decide whether to proceed with genetic testing until [name]'s liver biopsy results have returned. We will reassess the appropriateness of genetic testing at that time, and will discuss and coordinate testing with [name] as  appropriate.  PLAN:  We will wait for the results of [name] liver biopsy before deciding whether to proceed with genetic testing. Once a diagnosis has been determined, we will reach out via telephone to review his options for genetic testing. In the meantime, we recommend [name] continue to follow the cancer screening guidelines given by his primary healthcare provider.  [name] questions were answered to his satisfaction today. Our contact information was provided should additional questions or concerns arise. Thank you for the referral and allowing [place] to share in the care of your patient.   Clint Guy, [place], [facility] Licensed, Certified Dispensing [email or web address] Phone: [phone]  The patient was seen for a total of 35 minutes in face-to-face genetic counseling.  This patient was discussed with Drs. Magrinat, Lindi Adie and/or Burr Medico who agrees with the above.    _______________________________________________________________________ For Office Staff:  Number of people involved in session: 1 Was an Intern/ student involved with case: no

## 2020-05-03 ENCOUNTER — Ambulatory Visit (HOSPITAL_COMMUNITY)
Admission: RE | Admit: 2020-05-03 | Discharge: 2020-05-03 | Disposition: A | Discharge status: Home / Self Care | Payer: Medicare Other | Source: Ambulatory Visit | Attending: Hematology | Admitting: Hematology

## 2020-05-03 ENCOUNTER — Other Ambulatory Visit: Payer: Self-pay

## 2020-05-03 ENCOUNTER — Encounter (HOSPITAL_COMMUNITY): Payer: Self-pay

## 2020-05-03 DIAGNOSIS — F17210 Nicotine dependence, cigarettes, uncomplicated: Secondary | ICD-10-CM | POA: Insufficient documentation

## 2020-05-03 DIAGNOSIS — E119 Type 2 diabetes mellitus without complications: Secondary | ICD-10-CM | POA: Diagnosis not present

## 2020-05-03 DIAGNOSIS — I700 Atherosclerosis of aorta: Secondary | ICD-10-CM | POA: Insufficient documentation

## 2020-05-03 DIAGNOSIS — Z89512 Acquired absence of left leg below knee: Secondary | ICD-10-CM | POA: Diagnosis not present

## 2020-05-03 DIAGNOSIS — Z7982 Long term (current) use of aspirin: Secondary | ICD-10-CM | POA: Insufficient documentation

## 2020-05-03 DIAGNOSIS — J439 Emphysema, unspecified: Secondary | ICD-10-CM | POA: Diagnosis not present

## 2020-05-03 DIAGNOSIS — I10 Essential (primary) hypertension: Secondary | ICD-10-CM | POA: Insufficient documentation

## 2020-05-03 DIAGNOSIS — Z79899 Other long term (current) drug therapy: Secondary | ICD-10-CM | POA: Diagnosis not present

## 2020-05-03 DIAGNOSIS — M1990 Unspecified osteoarthritis, unspecified site: Secondary | ICD-10-CM | POA: Diagnosis not present

## 2020-05-03 DIAGNOSIS — Z7984 Long term (current) use of oral hypoglycemic drugs: Secondary | ICD-10-CM | POA: Diagnosis not present

## 2020-05-03 DIAGNOSIS — R160 Hepatomegaly, not elsewhere classified: Secondary | ICD-10-CM | POA: Diagnosis present

## 2020-05-03 DIAGNOSIS — K869 Disease of pancreas, unspecified: Secondary | ICD-10-CM | POA: Diagnosis not present

## 2020-05-03 DIAGNOSIS — K8689 Other specified diseases of pancreas: Secondary | ICD-10-CM

## 2020-05-03 DIAGNOSIS — C227 Other specified carcinomas of liver: Secondary | ICD-10-CM | POA: Diagnosis not present

## 2020-05-03 LAB — CBC
HCT: 41 % (ref 39.0–52.0)
Hemoglobin: 13.3 g/dL (ref 13.0–17.0)
MCH: 27.3 pg (ref 26.0–34.0)
MCHC: 32.4 g/dL (ref 30.0–36.0)
MCV: 84 fL (ref 80.0–100.0)
Platelets: 291 10*3/uL (ref 150–400)
RBC: 4.88 MIL/uL (ref 4.22–5.81)
RDW: 15.3 % (ref 11.5–15.5)
WBC: 9.2 10*3/uL (ref 4.0–10.5)
nRBC: 0 % (ref 0.0–0.2)

## 2020-05-03 LAB — PROTIME-INR
INR: 1 (ref 0.8–1.2)
Prothrombin Time: 12.6 seconds (ref 11.4–15.2)

## 2020-05-03 LAB — GLUCOSE, CAPILLARY: Glucose-Capillary: 272 mg/dL — ABNORMAL HIGH (ref 70–99)

## 2020-05-03 MED ORDER — FENTANYL CITRATE (PF) 100 MCG/2ML IJ SOLN
INTRAMUSCULAR | Status: AC | PRN
  Administered 2020-05-03: 50 ug via INTRAVENOUS

## 2020-05-03 MED ORDER — GELATIN ABSORBABLE 12-7 MM EX MISC
CUTANEOUS | Status: AC
  Filled 2020-05-03: qty 1

## 2020-05-03 MED ORDER — MIDAZOLAM HCL 2 MG/2ML IJ SOLN
INTRAMUSCULAR | Status: AC
  Filled 2020-05-03: qty 2

## 2020-05-03 MED ORDER — MIDAZOLAM HCL 2 MG/2ML IJ SOLN
INTRAMUSCULAR | Status: AC | PRN
  Administered 2020-05-03: 1 mg via INTRAVENOUS

## 2020-05-03 MED ORDER — SODIUM CHLORIDE 0.9 % IV SOLN: INTRAVENOUS | Status: DC

## 2020-05-03 MED ORDER — FENTANYL CITRATE (PF) 100 MCG/2ML IJ SOLN
INTRAMUSCULAR | Status: AC
  Filled 2020-05-03: qty 2

## 2020-05-03 MED ORDER — LIDOCAINE HCL (PF) 1 % IJ SOLN
INTRAMUSCULAR | Status: AC
  Filled 2020-05-03: qty 30

## 2020-05-03 NOTE — H&P (Signed)
Chief Complaint: Patient was seen in consultation today for liver lesion biopsy at the request of [place]  Referring Physician(s): Katragadda,Sreedhar  Supervising Physician: Aletta Edouard  Patient Status: [facility] - [facility]  History of Present Illness: John Hanover Sr. is a 69 y.o. male   Wt loss and abc pain x 6 weeks Anorexia To ED [facility] 04/24/20 Covid neg Work up reveals: Pancreatic mass; renal mass and liver mass and lymphadenopathy  CT  IMPRESSION: 1. Hypodense mass within the pancreatic head/uncinate process, measuring 2.2 cm, highly suspicious for pancreatic neoplasm. Recommend further characterization with pancreatic protocol MRI. 2. Scattered small hypodense foci throughout the bilateral liver lobes, largest within the posterior RIGHT liver lobe measuring 1.1 cm, majority too small to definitively characterize but largest suspicious for neoplastic/metastatic process. 3. 1.4 cm mass within the lateral cortex of the RIGHT kidney, with probable enhancement centrally, suspicious for additional neoplastic mass, alternatively benign angiomyolipoma. Recommend further characterization with nonemergent renal MRI. 4. Mildly prominent lymph nodes within the upper abdomen, just above the pancreatic head and portacaval space, suspicious for metastatic lymphadenopathy. 5. 5 mm pleural based nodular density within the RIGHT lower lobe, nonspecific but suspicious given the presumed pancreatic neoplasm, liver metastases and possible concomitant renal neoplasm.  Dr Delton Coombes requesting biopsy for diagnosis Liver lesion biopsy approved with Dr Vernard Gambles  Scheduled today for same   Past Medical History:  Diagnosis Date  . Arthritis   . Diabetes mellitus without complication ([facility])   . Emesis   . Family history of cervical cancer   . Heart murmur   . Hyperglycemia   . Hypertension   . Joint pain     Past Surgical History:  Procedure Laterality Date  .  AMPUTATION Left 08/02/2018   Procedure: LEFT AMPUTATION BELOW KNEE;  Surgeon: Marty Heck, MD;  Location: [facility];  Service: Vascular;  Laterality: Left;  . ELBOW SURGERY     Bone graft to repair elbow  . LOWER EXTREMITY ANGIOGRAPHY N/A 07/24/2018   Procedure: LOWER EXTREMITY ANGIOGRAPHY;  Surgeon: Marty Heck, MD;  Location: [facility];  Service: Cardiovascular;  Laterality: N/A;  . PERIPHERAL VASCULAR INTERVENTION  07/24/2018   Procedure: PERIPHERAL VASCULAR INTERVENTION;  Surgeon: Marty Heck, MD;  Location: [facility];  Service: Cardiovascular;;  . TONSILLECTOMY    . TRANSMETATARSAL AMPUTATION Left 07/25/2018   Procedure: LEFT GREAT TOE AMPUTATION;  Surgeon: Marty Heck, MD;  Location: [facility];  Service: Vascular;  Laterality: Left;  . TRANSMETATARSAL AMPUTATION Left 07/30/2018   Procedure: TRANSMETATARSAL AMPUTATION;  Surgeon: Waynetta Sandy, MD;  Location: [facility];  Service: Vascular;  Laterality: Left;    Allergies: Tea  Medications: Prior to Admission medications   Medication Sig Start Date End Date Taking? Authorizing Provider  acetaminophen (TYLENOL) 325 MG tablet Take 2 tablets (650 mg total) by mouth every 6 (six) hours as needed for mild pain, moderate pain or fever. 08/05/18  Yes Nita Sells, MD  aspirin EC 81 MG EC tablet Take 1 tablet (81 mg total) by mouth daily. 08/06/18  Yes Nita Sells, MD  bisacodyl (DULCOLAX) 5 MG EC tablet Take 1 tablet (5 mg total) by mouth daily as needed for moderate constipation. 04/26/20  Yes Shah, Pratik D, DO  blood glucose meter kit and supplies KIT Dispense based on patient and insurance preference. Use up to four times daily as directed. (FOR ICD-9 250.00, 250.01). 04/26/20  Yes Shah, Pratik D, DO  HYDROcodone-acetaminophen (NORCO/VICODIN) 5-325 MG tablet Take  1-2 tablets by mouth every 6 (six) hours as needed for moderate pain or severe pain. 04/26/20  Yes Manuella Ghazi, Pratik D, DO    metFORMIN (GLUCOPHAGE) 500 MG tablet Take 1 tablet (500 mg total) by mouth 2 (two) times daily with a meal. 04/26/20 04/26/21 Yes Manuella Ghazi, Pratik D, DO     Family History  Problem Relation Age of Onset  . Alcoholism Mother   . Alcoholism Father   . Cervical cancer Sister        dx. late 35s  . Cervical cancer Sister        dx. late 98s    Social History   Socioeconomic History  . Marital status: Married    Spouse name: Not on file  . Number of children: Not on file  . Years of education: Not on file  . Highest education level: Not on file  Occupational History  . Not on file  Tobacco Use  . Smoking status: Current Every Day Smoker    Packs/day: 0.50    Types: Cigarettes  . Smokeless tobacco: Former Systems developer    Types: Snuff    Quit date: 04/21/1989  Vaping Use  . Vaping Use: Never used  Substance and Sexual Activity  . Alcohol use: Yes    Alcohol/week: 12.0 standard drinks    Types: 12 Cans of beer per week  . Drug use: No  . Sexual activity: Not Currently  Other Topics Concern  . Not on file  Social History Narrative  . Not on file   Social Determinants of Health   Financial Resource Strain:   . Difficulty of Paying Living Expenses:   Food Insecurity:   . Worried About Charity fundraiser in the Last Year:   . Arboriculturist in the Last Year:   Transportation Needs:   . Film/video editor (Medical):   Marland Kitchen Lack of Transportation (Non-Medical):   Physical Activity:   . Days of Exercise per Week:   . Minutes of Exercise per Session:   Stress:   . Feeling of Stress :   Social Connections:   . Frequency of Communication with Friends and Family:   . Frequency of Social Gatherings with Friends and Family:   . Attends Religious Services:   . Active Member of Clubs or Organizations:   . Attends Archivist Meetings:   Marland Kitchen Marital Status:      Review of Systems: A 12 point ROS discussed and pertinent positives are indicated in the HPI above.  All other  systems are negative.  Review of Systems  Constitutional: Positive for activity change, appetite change and unexpected weight change. Negative for fever.  HENT: Negative for trouble swallowing.   Respiratory: Negative for cough and shortness of breath.   Cardiovascular: Negative for chest pain.  Gastrointestinal: Positive for nausea.  Neurological: Positive for weakness.  Psychiatric/Behavioral: Negative for behavioral problems and confusion.    Vital Signs: BP (!) 162/88   Pulse 80   Temp (!) 97.5 F (36.4 C) (Oral)   Resp 16   Ht 5' 11" (1.803 m)   Wt 148 lb 2.4 oz (67.2 kg)   SpO2 99%   BMI 20.66 kg/m   Physical Exam Vitals reviewed.  Cardiovascular:     Rate and Rhythm: Normal rate and regular rhythm.     Heart sounds: Normal heart sounds.  Pulmonary:     Effort: Pulmonary effort is normal.     Breath sounds: Normal breath sounds.  Abdominal:  Palpations: Abdomen is soft.     Tenderness: There is abdominal tenderness.  Musculoskeletal:        General: Normal range of motion.     Comments: L BKA  Skin:    General: Skin is warm.  Neurological:     Mental Status: He is alert and oriented to person, place, and time.  Psychiatric:        Behavior: Behavior normal.     Imaging: CT ABDOMEN PELVIS W CONTRAST  Result Date: 04/24/2020 CLINICAL DATA:  LEFT upper quadrant abdominal pain. EXAM: CT ABDOMEN AND PELVIS WITH CONTRAST TECHNIQUE: Multidetector CT imaging of the abdomen and pelvis was performed using the standard protocol following bolus administration of intravenous contrast. CONTRAST:  18m OMNIPAQUE IOHEXOL 300 MG/ML  SOLN COMPARISON:  None. FINDINGS: Lower chest: 5 mm pleural based nodular density within the RIGHT lower lobe. Emphysematous blebs at the RIGHT lung base. Hepatobiliary: Scattered small hypodense foci throughout the bilateral liver lobes, largest within the posterior RIGHT liver lobe measuring 1.1 cm majority too small to definitively  characterize largest suspicious for neoplastic/metastatic process. Gallstones. Questionable gallbladder wall thickening. No pericholecystic fluid. No bile duct dilatation seen Pancreas: Hypodense mass within the head/uncinate process of the pancreas, measuring 2.2 cm, highly suspicious for pancreatic neoplasm. No pancreatic duct dilatation seen. Spleen: Normal in size without focal abnormality. Adrenals/Urinary Tract: Adrenal glands appear normal. 1.4 cm mass within the lateral cortex of the RIGHT kidney, with probable enhancement centrally, suspicious for additional neoplastic mass, alternatively angiomyolipoma (axial series 2, image 31; coronal series 5, image 59). LEFT kidney appears. No hydronephrosis. No ureteral or bladder calculi identified. Bladder appears normal. Stomach/Bowel: No dilated large or small bowel loops. No evidence of bowel wall inflammation. Appendix is normal. Stomach is unremarkable, partially decompressed. Vascular/Lymphatic: Aortic atherosclerosis. No acute appearing vascular abnormality. Mildly prominent lymph nodes within the upper abdomen, just above the pancreatic head with short axis measurement of 11 mm and portacaval space with short axis measurement of 12 mm. No enlarged lymph nodes seen elsewhere within the abdomen or pelvis. Reproductive: Prostate is unremarkable. Other: No free fluid or free intraperitoneal air. Musculoskeletal: Degenerative spondylosis of the thoracolumbar spine, mild to moderate in degree. No acute or suspicious osseous findings identified. IMPRESSION: 1. Hypodense mass within the pancreatic head/uncinate process, measuring 2.2 cm, highly suspicious for pancreatic neoplasm. Recommend further characterization with pancreatic protocol MRI. 2. Scattered small hypodense foci throughout the bilateral liver lobes, largest within the posterior RIGHT liver lobe measuring 1.1 cm, majority too small to definitively characterize but largest suspicious for  neoplastic/metastatic process. 3. 1.4 cm mass within the lateral cortex of the RIGHT kidney, with probable enhancement centrally, suspicious for additional neoplastic mass, alternatively benign angiomyolipoma. Recommend further characterization with nonemergent renal MRI. 4. Mildly prominent lymph nodes within the upper abdomen, just above the pancreatic head and portacaval space, suspicious for metastatic lymphadenopathy. 5. 5 mm pleural based nodular density within the RIGHT lower lobe, nonspecific but suspicious given the presumed pancreatic neoplasm, liver metastases and possible concomitant renal neoplasm. 6. Cholelithiasis. Questionable gallbladder wall thickening but no pericholecystic fluid to suggest acute cholecystitis. If any localizable RIGHT upper quadrant pain and/or clinical suspicion for acute cholecystitis, consider RIGHT upper quadrant ultrasound for further characterization. Aortic Atherosclerosis (ICD10-I70.0) and Emphysema (ICD10-J43.9). Electronically Signed   By: SFranki CabotM.D.   On: 04/24/2020 15:11    Labs:  CBC: Recent Labs    04/24/20 1207 04/25/20 0640  WBC 9.3 10.2  HGB 13.8 13.3  HCT 43.2 41.0  PLT 257 217    COAGS: No results for input(s): INR, APTT in the last 8760 hours.  BMP: Recent Labs    04/24/20 1207 04/25/20 0640  NA 133* 137  K 3.8 3.9  CL 101 106  CO2 20* 22  GLUCOSE 523* 258*  BUN 10 8  CALCIUM 8.7* 8.4*  CREATININE 0.85 0.65  GFRNONAA >60 >60  GFRAA >60 >60    LIVER FUNCTION TESTS: Recent Labs    04/24/20 1207  BILITOT 0.8  AST 26  ALT 18  ALKPHOS 122  PROT 7.0  ALBUMIN 3.4*    TUMOR MARKERS: No results for input(s): AFPTM, CEA, CA199, CHROMGRNA in the last 8760 hours.  Assessment and Plan:  Wt loss; abd pain Anorexia Pancreatic mass; renal mass; liver lesion and lymphadenopathy Scheduled for liver mass biopsy per Dr Delton Coombes Risks and benefits of liver lesion biopsy was discussed with the patient and/or  patient's family including, but not limited to bleeding, infection, damage to adjacent structures or low yield requiring additional tests.  All of the questions were answered and there is agreement to proceed. Consent signed and in chart.    Thank you for this interesting consult.  I greatly enjoyed meeting John Klecka Sr. and look forward to participating in their care.  A copy of this report was sent to the requesting provider on this date.  Electronically Signed: Lavonia Drafts, PA-C 05/03/2020, 11:58 AM   I spent a total of  30 Minutes   in face to face in clinical consultation, greater than 50% of which was counseling/coordinating care for liver lesion biopsy

## 2020-05-03 NOTE — Procedures (Signed)
Interventional Radiology Procedure Note  Procedure: US Guided Biopsy of liver lesion  Complications: None  Estimated Blood Loss: < 10 mL  Findings: 18 G core biopsy of right lobe liver lesion performed under US guidance.  Three core samples obtained and sent to Pathology.  Blaiden Werth T. Carzell Saldivar, M.D Pager:  [phone]   

## 2020-05-03 NOTE — Discharge Instructions (Signed)
Monitored Anesthesia Care, Care After These instructions provide you with information about caring for yourself after your procedure. Your health care provider may also give you more specific instructions. Your treatment has been planned according to current medical practices, but problems sometimes occur. Call your health care provider if you have any problems or questions after your procedure. What can I expect after the procedure? After your procedure, you may:  Feel sleepy for several hours.  Feel clumsy and have poor balance for several hours.  Feel forgetful about what happened after the procedure.  Have poor judgment for several hours.  Feel nauseous or vomit.  Have a sore throat if you had a breathing tube during the procedure. Follow these instructions at home: For at least 24 hours after the procedure:      Have a responsible adult stay with you. It is important to have someone help care for you until you are awake and alert.  Rest as needed.  Do not: ? Participate in activities in which you could fall or become injured. ? Drive. ? Use heavy machinery. ? Drink alcohol. ? Take sleeping pills or medicines that cause drowsiness. ? Make important decisions or sign legal documents. ? Take care of children on your own. Eating and drinking  Follow the diet that is recommended by your health care provider.  If you vomit, drink water, juice, or soup when you can drink without vomiting.  Make sure you have little or no nausea before eating solid foods. General instructions  Take over-the-counter and prescription medicines only as told by your health care provider.  If you have sleep apnea, surgery and certain medicines can increase your risk for breathing problems. Follow instructions from your health care provider about wearing your sleep device: ? Anytime you are sleeping, including during daytime naps. ? While taking prescription pain medicines, sleeping  medicines, or medicines that make you drowsy.  If you smoke, do not smoke without supervision.  Keep all follow-up visits as told by your health care provider. This is important. Contact a health care provider if:  You keep feeling nauseous or you keep vomiting.  You feel light-headed.  You develop a rash.  You have a fever. Get help right away if:  You have trouble breathing. Summary  For several hours after your procedure, you may feel sleepy and have poor judgment.  Have a responsible adult stay with you for at least 24 hours or until you are awake and alert. This information is not intended to replace advice given to you by your health care provider. Make sure you discuss any questions you have with your health care provider. Document Revised: 12/10/2017 Document Reviewed: 01/02/2016 Elsevier Patient Education  [place].  Liver Biopsy, Care After These instructions give you information on caring for yourself after your procedure. Your doctor may also give you more specific instructions. Call your doctor if you have any problems or questions after your procedure. What can I expect after the procedure? After the procedure, it is common to have:  Pain and soreness where the biopsy was done.  Bruising around the area where the biopsy was done.  Sleepiness and be tired for a few days. Follow these instructions at home: Medicines  Take over-the-counter and prescription medicines only as told by your doctor.  If you were prescribed an antibiotic medicine, take it as told by your doctor. Do not stop taking the antibiotic even if you start to feel better.  Do not  take medicines such as aspirin and ibuprofen. These medicines can thin your blood. Do not take these medicines unless your doctor tells you to take them.  If you are taking prescription pain medicine, take actions to prevent or treat constipation. Your doctor may recommend that you: ? Drink enough fluid to  keep your pee (urine) clear or pale yellow. ? Take over-the-counter or prescription medicines. ? Eat foods that are high in fiber, such as fresh fruits and vegetables, whole grains, and beans. ? Limit foods that are high in fat and processed sugars, such as fried and sweet foods. Caring for your cut  Follow instructions from your doctor about how to take care of your cuts from surgery (incisions). Make sure you: ? Wash your hands with soap and water before you change your bandage (dressing). If you cannot use soap and water, use hand sanitizer. ? Change your bandage as told by your doctor. ? Leave stitches (sutures), skin glue, or skin tape (adhesive) strips in place. They may need to stay in place for 2 weeks or longer. If tape strips get loose and curl up, you may trim the loose edges. Do not remove tape strips completely unless your doctor says it is okay.  Check your cuts every day for signs of infection. Check for: ? Redness, swelling, or more pain. ? Fluid or blood. ? Pus or a bad smell. ? Warmth.  Do not take baths, swim, or use a hot tub until your doctor says it is okay to do so. Activity   Rest at home for 1-2 days or as told by your doctor. ? Avoid sitting for a long time without moving. Get up to take short walks every 1-2 hours.  Return to your normal activities as told by your doctor. Ask what activities are safe for you.  Do not do these things in the first 24 hours: ? Drive. ? Use machinery. ? Take a bath or shower.  Do not lift more than 10 pounds (4.5 kg) or play contact sports for the first 2 weeks. General instructions   Do not drink alcohol in the first week after the procedure.  Have someone stay with you for at least 24 hours after the procedure.  Get your test results. Ask your doctor or the department that is doing the test: ? When will my results be ready? ? How will I get my results? ? What are my treatment options? ? What other tests do I  need? ? What are my next steps?  Keep all follow-up visits as told by your doctor. This is important. Contact a doctor if:  A cut bleeds and leaves more than just a small spot of blood.  A cut is red, puffs up (swells), or hurts more than before.  Fluid or something else comes from a cut.  A cut smells bad.  You have a fever or chills. Get help right away if:  You have swelling, bloating, or pain in your belly (abdomen).  You get dizzy or faint.  You have a rash.  You feel sick to your stomach (nauseous) or throw up (vomit).  You have trouble breathing, feel short of breath, or feel faint.  Your chest hurts.  You have problems talking or seeing.  You have trouble with your balance or moving your arms or legs. Summary  After the procedure, it is common to have pain, soreness, bruising, and tiredness.  Your doctor will tell you how to take care of yourself  at home. Change your bandage, take your medicines, and limit your activities as told by your doctor.  Call your doctor if you have symptoms of infection. Get help right away if your belly swells, your cut bleeds a lot, or you have trouble talking or breathing. This information is not intended to replace advice given to you by your health care provider. Make sure you discuss any questions you have with your health care provider. Document Revised: 09/21/2017 Document Reviewed: 09/21/2017 Elsevier Patient Education  2020 [employer].

## 2020-05-05 LAB — SURGICAL PATHOLOGY

## 2020-05-11 ENCOUNTER — Inpatient Hospital Stay (HOSPITAL_COMMUNITY): Payer: Medicare Other | Attending: Hematology | Admitting: Hematology

## 2020-05-11 VITALS — BP 116/75 | HR 96 | Temp 97.1°F | Resp 18 | Wt 149.9 lb

## 2020-05-11 DIAGNOSIS — C787 Secondary malignant neoplasm of liver and intrahepatic bile duct: Secondary | ICD-10-CM | POA: Insufficient documentation

## 2020-05-11 DIAGNOSIS — K8689 Other specified diseases of pancreas: Secondary | ICD-10-CM | POA: Diagnosis not present

## 2020-05-11 DIAGNOSIS — E119 Type 2 diabetes mellitus without complications: Secondary | ICD-10-CM | POA: Diagnosis not present

## 2020-05-11 DIAGNOSIS — Z7189 Other specified counseling: Secondary | ICD-10-CM

## 2020-05-11 DIAGNOSIS — R109 Unspecified abdominal pain: Secondary | ICD-10-CM | POA: Diagnosis not present

## 2020-05-11 DIAGNOSIS — C259 Malignant neoplasm of pancreas, unspecified: Secondary | ICD-10-CM | POA: Insufficient documentation

## 2020-05-11 DIAGNOSIS — F17210 Nicotine dependence, cigarettes, uncomplicated: Secondary | ICD-10-CM | POA: Diagnosis not present

## 2020-05-11 MED ORDER — METFORMIN HCL 500 MG PO TABS: 500.0000 mg | ORAL_TABLET | Freq: Two times a day (BID) | ORAL | 11 refills | Status: DC

## 2020-05-11 NOTE — Progress Notes (Signed)
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    CANCER STAGING: Cancer Staging No matching staging information was found for the patient.  INTERVAL HISTORY:  [name], a 69 y.o. 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 [name] was last seen on 04/26/2020.  Today he complains of constant pain in his abdomen; the pain is not affected by food intake and he takes 1 tablet of Norco 5/325 as needed which helps only a little, but denies feeling drowsy if he takes it. He has not tried taking 2 tablets for the pain. He reports trouble sleeping on his left side due to the abdominal pain. His appetite is depleted and he denies N/V/D; he denies having any pain or difficulty swallowing. He notes that his taste has changed recently. He denies having any numbness or tingling. He denies any numbness or pain in his left stump. He denies hematuria. His BG at home is in the 200s.   REVIEW OF SYSTEMS:  Review of Systems  Constitutional: Positive for appetite change (depleted) and fatigue (severe). Negative for unexpected weight change.  HENT:   Positive for trouble swallowing (difficulty chewing d/t teeth).   Respiratory: Positive for cough.   Gastrointestinal: Positive for abdominal pain (8/10 lower abdominal pain). Negative for diarrhea, nausea and vomiting.  Genitourinary: Negative for hematuria.   Musculoskeletal: Negative for arthralgias.  Neurological: Negative for numbness.  All other systems reviewed and are negative.   PAST MEDICAL/SURGICAL HISTORY:  Past Medical History:  Diagnosis Date  . Arthritis     . Diabetes mellitus without complication ([place])   . Emesis   . Family history of cervical cancer   . Heart murmur   . Hyperglycemia   . Hypertension   . Joint pain    Past Surgical History:  Procedure Laterality Date  . AMPUTATION Left 08/02/2018   Procedure: LEFT AMPUTATION BELOW KNEE;  Surgeon: John Heck, MD;  Location: [facility];  Service: Vascular;  Laterality: Left;  . ELBOW SURGERY     Bone graft to repair elbow  . LOWER EXTREMITY ANGIOGRAPHY N/A 07/24/2018   Procedure: LOWER EXTREMITY ANGIOGRAPHY;  Surgeon: John Heck, MD;  Location: [facility];  Service: Cardiovascular;  Laterality: N/A;  . PERIPHERAL VASCULAR INTERVENTION  07/24/2018   Procedure: PERIPHERAL VASCULAR INTERVENTION;  Surgeon: John Heck, MD;  Location: [facility];  Service: Cardiovascular;;  . TONSILLECTOMY    . TRANSMETATARSAL AMPUTATION Left 07/25/2018   Procedure: LEFT GREAT TOE AMPUTATION;  Surgeon: John Heck, MD;  Location: [facility];  Service: Vascular;  Laterality: Left;  . TRANSMETATARSAL AMPUTATION Left 07/30/2018   Procedure: TRANSMETATARSAL AMPUTATION;  Surgeon: Waynetta Sandy, MD;  Location: [facility];  Service: Vascular;  Laterality: Left;    SOCIAL HISTORY:  Social History   Socioeconomic History  . Marital status: Married    Spouse name: Not on file  . Number of children: Not on file  . Years of education: Not on file  . Highest education level: Not on file  Occupational History  . Not on file  Tobacco Use  . Smoking status:  Current Every Day Smoker    Packs/day: 0.50    Types: Cigarettes  . Smokeless tobacco: Former Systems developer    Types: Snuff    Quit date: 04/21/1989  Vaping Use  . Vaping Use: Never used  Substance and Sexual Activity  . Alcohol use: Yes    Alcohol/week: 12.0 standard drinks    Types: 12 Cans of beer per week  . Drug use: No  . Sexual activity: Not Currently  Other Topics Concern  . Not on file  Social History  Narrative  . Not on file   Social Determinants of Health   Financial Resource Strain:   . Difficulty of Paying Living Expenses:   Food Insecurity:   . Worried About Charity fundraiser in the Last Year:   . Arboriculturist in the Last Year:   Transportation Needs:   . Film/video editor (Medical):   Marland Kitchen Lack of Transportation (Non-Medical):   Physical Activity:   . Days of Exercise per Week:   . Minutes of Exercise per Session:   Stress:   . Feeling of Stress :   Social Connections:   . Frequency of Communication with Friends and Family:   . Frequency of Social Gatherings with Friends and Family:   . Attends Religious Services:   . Active Member of Clubs or Organizations:   . Attends Archivist Meetings:   Marland Kitchen Marital Status:   Intimate Partner Violence:   . Fear of Current or Ex-Partner:   . Emotionally Abused:   Marland Kitchen Physically Abused:   . Sexually Abused:     FAMILY HISTORY:  Family History  Problem Relation Age of Onset  . Alcoholism Mother   . Alcoholism Father   . Cervical cancer Sister        dx. late 23s  . Cervical cancer Sister        dx. late 35s    CURRENT MEDICATIONS:  Current Outpatient Medications  Medication Sig Dispense Refill  . acetaminophen (TYLENOL) 325 MG tablet Take 2 tablets (650 mg total) by mouth every 6 (six) hours as needed for mild pain, moderate pain or fever. 30 tablet 0  . aspirin EC 81 MG EC tablet Take 1 tablet (81 mg total) by mouth daily. 30 tablet 0  . bisacodyl (DULCOLAX) 5 MG EC tablet Take 1 tablet (5 mg total) by mouth daily as needed for moderate constipation. 30 tablet 0  . blood glucose meter kit and supplies KIT Dispense based on patient and insurance preference. Use up to four times daily as directed. (FOR ICD-9 250.00, 250.01). 1 each 0  . HYDROcodone-acetaminophen (NORCO/VICODIN) 5-325 MG tablet Take 1-2 tablets by mouth every 6 (six) hours as needed for moderate pain or severe pain. 30 tablet 0  . metFORMIN  (GLUCOPHAGE) 500 MG tablet Take 1 tablet (500 mg total) by mouth 2 (two) times daily with a meal. 60 tablet 11   No current facility-administered medications for this visit.    ALLERGIES:  Allergies  Allergen Reactions  . Tea Itching and Rash    PHYSICAL EXAM:  Performance status (ECOG): 1 - Symptomatic but completely ambulatory  Vitals:   05/11/20 1255  BP: 116/75  Pulse: 96  Resp: 18  Temp: (!) 97.1 F (36.2 C)  SpO2: 100%   Wt Readings from Last 3 Encounters:  05/11/20 149 lb 14.4 oz (68 kg)  05/03/20 148 lb 2.4 oz (67.2 kg)  04/24/20 148 lb 2.4 oz (67.2 kg)  Physical Exam Vitals reviewed.  Constitutional:      Appearance: Normal appearance.  Cardiovascular:     Rate and Rhythm: Normal rate and regular rhythm.     Pulses: Normal pulses.     Heart sounds: Normal heart sounds.  Pulmonary:     Effort: Pulmonary effort is normal.     Breath sounds: Normal breath sounds.  Abdominal:     Palpations: Abdomen is soft. There is no mass.     Tenderness: There is abdominal tenderness in the epigastric area.  Musculoskeletal:     Right lower leg: No edema.     Left Lower Extremity: Left leg is amputated below knee.  Neurological:     General: No focal deficit present.     Mental Status: He is alert and oriented to person, place, and time.  Psychiatric:        Mood and Affect: Mood normal.        Behavior: Behavior normal.      LABORATORY DATA:  I have reviewed the labs as listed.  CBC Latest Ref Rng & Units 05/03/2020 04/25/2020 04/24/2020  WBC 4.0 - 10.5 K/uL 9.2 10.2 9.3  Hemoglobin 13.0 - 17.0 g/dL 13.3 13.3 13.8  Hematocrit 39 - 52 % 41.0 41.0 43.2  Platelets 150 - 400 K/uL 291 217 257   CMP Latest Ref Rng & Units 04/25/2020 04/24/2020 08/06/2018  Glucose 70 - 99 mg/dL 258(H) 523(HH) 195(H)  BUN 8 - 23 mg/dL 8 10 6(L)  Creatinine 0.61 - 1.24 mg/dL 0.65 0.85 0.62  Sodium 135 - 145 mmol/L 137 133(L) 133(L)  Potassium 3.5 - 5.1 mmol/L 3.9 3.8 3.3(L)  Chloride 98 -  111 mmol/L 106 101 103  CO2 22 - 32 mmol/L 22 20(L) 24  Calcium 8.9 - 10.3 mg/dL 8.4(L) 8.7(L) 8.1(L)  Total Protein 6.5 - 8.1 g/dL - 7.0 6.3(L)  Total Bilirubin 0.3 - 1.2 mg/dL - 0.8 0.7  Alkaline Phos 38 - 126 U/L - 122 61  AST 15 - 41 U/L - 26 42(H)  ALT 0 - 44 U/L - 18 33   Lab Results  Component Value Date   CAN199 12 04/25/2020    DIAGNOSTIC IMAGING:  I have independently reviewed the scans and discussed with the patient. CT ABDOMEN PELVIS W CONTRAST  Result Date: 04/24/2020 CLINICAL DATA:  LEFT upper quadrant abdominal pain. EXAM: CT ABDOMEN AND PELVIS WITH CONTRAST TECHNIQUE: Multidetector CT imaging of the abdomen and pelvis was performed using the standard protocol following bolus administration of intravenous contrast. CONTRAST:  148m OMNIPAQUE IOHEXOL 300 MG/ML  SOLN COMPARISON:  None. FINDINGS: Lower chest: 5 mm pleural based nodular density within the RIGHT lower lobe. Emphysematous blebs at the RIGHT lung base. Hepatobiliary: Scattered small hypodense foci throughout the bilateral liver lobes, largest within the posterior RIGHT liver lobe measuring 1.1 cm majority too small to definitively characterize largest suspicious for neoplastic/metastatic process. Gallstones. Questionable gallbladder wall thickening. No pericholecystic fluid. No bile duct dilatation seen Pancreas: Hypodense mass within the head/uncinate process of the pancreas, measuring 2.2 cm, highly suspicious for pancreatic neoplasm. No pancreatic duct dilatation seen. Spleen: Normal in size without focal abnormality. Adrenals/Urinary Tract: Adrenal glands appear normal. 1.4 cm mass within the lateral cortex of the RIGHT kidney, with probable enhancement centrally, suspicious for additional neoplastic mass, alternatively angiomyolipoma (axial series 2, image 31; coronal series 5, image 59). LEFT kidney appears. No hydronephrosis. No ureteral or bladder calculi identified. Bladder appears normal. Stomach/Bowel: No dilated  large or small  bowel loops. No evidence of bowel wall inflammation. Appendix is normal. Stomach is unremarkable, partially decompressed. Vascular/Lymphatic: Aortic atherosclerosis. No acute appearing vascular abnormality. Mildly prominent lymph nodes within the upper abdomen, just above the pancreatic head with short axis measurement of 11 mm and portacaval space with short axis measurement of 12 mm. No enlarged lymph nodes seen elsewhere within the abdomen or pelvis. Reproductive: Prostate is unremarkable. Other: No free fluid or free intraperitoneal air. Musculoskeletal: Degenerative spondylosis of the thoracolumbar spine, mild to moderate in degree. No acute or suspicious osseous findings identified. IMPRESSION: 1. Hypodense mass within the pancreatic head/uncinate process, measuring 2.2 cm, highly suspicious for pancreatic neoplasm. Recommend further characterization with pancreatic protocol MRI. 2. Scattered small hypodense foci throughout the bilateral liver lobes, largest within the posterior RIGHT liver lobe measuring 1.1 cm, majority too small to definitively characterize but largest suspicious for neoplastic/metastatic process. 3. 1.4 cm mass within the lateral cortex of the RIGHT kidney, with probable enhancement centrally, suspicious for additional neoplastic mass, alternatively benign angiomyolipoma. Recommend further characterization with nonemergent renal MRI. 4. Mildly prominent lymph nodes within the upper abdomen, just above the pancreatic head and portacaval space, suspicious for metastatic lymphadenopathy. 5. 5 mm pleural based nodular density within the RIGHT lower lobe, nonspecific but suspicious given the presumed pancreatic neoplasm, liver metastases and possible concomitant renal neoplasm. 6. Cholelithiasis. Questionable gallbladder wall thickening but no pericholecystic fluid to suggest acute cholecystitis. If any localizable RIGHT upper quadrant pain and/or clinical suspicion for acute  cholecystitis, consider RIGHT upper quadrant ultrasound for further characterization. Aortic Atherosclerosis (ICD10-I70.0) and Emphysema (ICD10-J43.9). Electronically Signed   By: Franki Cabot M.D.   On: 04/24/2020 15:11   [place] CORE BIOPSY (LIVER)  Result Date: 05/03/2020 INDICATION: Pancreatic mass and multiple small liver lesions suspicious for metastatic disease. EXAM: ULTRASOUND GUIDED CORE BIOPSY OF LIVER MEDICATIONS: None. ANESTHESIA/SEDATION: Fentanyl 50 mcg IV; Versed 1.0 mg IV Moderate Sedation Time:  28 minutes. The patient was continuously monitored during the procedure by the interventional radiology nurse under my direct supervision. PROCEDURE: The procedure, risks, benefits, and alternatives were explained to the patient. Questions regarding the procedure were encouraged and answered. The patient understands and consents to the procedure. A time-out was performed prior to initiating the procedure. The right abdominal wall was prepped with chlorhexidine in a sterile fashion, and a sterile drape was applied covering the operative field. A sterile gown and sterile gloves were used for the procedure. Local anesthesia was provided with 1% Lidocaine. Ultrasound was used to localize liver lesions. Under direct ultrasound guidance, a 17 gauge needle was advanced into the right lobe of the liver. Four separate coaxial 18 gauge core biopsy samples were obtained. Gel-Foam pledgets were advanced through the outer needle as the outer needle was removed. COMPLICATIONS: None immediate. FINDINGS: Multiple small, rounded hypoechoic liver lesions are seen scattered throughout the liver. These are all on the order of 1 cm in diameter. A peripheral, inferior right lobe lesion measuring approximately 1 cm was chosen for sampling. Solid tissue was obtained. IMPRESSION: Ultrasound-guided core biopsy performed of a roughly 1 cm lesion in the right lobe of the liver. Electronically Signed   By: Aletta Edouard M.D.   On:  05/03/2020 16:38     ASSESSMENT:  1.  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 -Current active smoker, 1 pack/day for 47 years.  No history of alcohol use.  No family  history of malignancy. -CT AP with contrast on 04/24/2020 shows 2.2 cm pancreatic head mass, small hypodense lesions throughout the bilateral lower lobes, predominantly in the right lower lobe, largest measuring 1.1 cm.  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  Mildly prominent lymph nodes within the upper abdomen above the pancreas head suspicious for metastatic lymphadenopathy.  5 mm pleural-based right lower lobe nodule. -CA 19-9 is 12. -Liver biopsy on 05/03/2020 consistent with metastatic adenocarcinoma, positive for CK7, CK20, CDX2.  Differential diagnosis includes primary upper GI versus pancreaticobiliary.  2.  Poorly controlled diabetes: -Hemoglobin A1c was 10.8 during hospitalization.  3.  Abdominal pain: -Highly likely malignancy related. -He is currently on hydrocodone 5/325 1 to 2 tablets every 4 hours as needed.  Patient reports that he still has pain but manageable.  4.  Right kidney mass: -No further work-up needed if biopsy of liver lesion confirms pancreatic adenocarcinoma.   PLAN:  1.  Highly likely metastatic pancreatic adenocarcinoma to the liver: -I have discussed the biopsy results with the patient in detail. -We will order PET scan to see if there is any uptake in the upper GI tract.  I think it is most likely pancreatic cancer with an upper GI. -We will also ask for port placement.  I will see him back after the PET scan.  2.  Poorly controlled diabetes: -He reports fasting blood sugars up to 200. -We will send him a prescription for Metformin 500 mg twice daily.  He was asked to check blood sugars fasting and postprandial and bring it  back to [place].  3.  Abdominal pain: -He is taking hydrocodone 5/325 every 4 hours.  It is reasonably controlled.  4.  Right kidney mass: -We will follow up on PET scan.  5.  Genetic testing: -This is on hold at this time pending confirmation of pancreatic primary.    Orders placed this encounter:  No orders of the defined types were placed in this encounter.    Derek Jack, MD [place] [phone]   I, Milinda Antis, am acting as a scribe for Dr. Sanda Linger.  I, Derek Jack MD, have reviewed the above documentation for accuracy and completeness, and I agree with the above.

## 2020-05-11 NOTE — Patient Instructions (Signed)
[place] at [facility] Discharge Instructions  You were seen and examined today by Dr. Delton Coombes. Dr. Delton Coombes is a medical oncologist.  On the recent scan of you abdomen, an area of concern was noted on the pancreas and a few small areas present on your liver. The biopsy revealed that this is cancer arising from your gastrointestinal tract or pancreas.  Dr. Delton Coombes has recommended a PET scan, which will show Dr. Delton Coombes if there is cancer present elsewhere in your body. We will also refer you to the general surgeon in [place] for a Port-A-Cath placement, a Port-A-Cath is the safest way to administer chemotherapy. You should also have a primary care physician to manage your diabetes and other health conditions.  Try to increase your pain pill to two pills at once, if this helps, please call the clinic and let us know. If this doesn't help, call us and let us know.   Thank you for choosing [place] at [facility] to provide your oncology and hematology care.  To afford each patient quality time with our provider, please arrive at least 15 minutes before your scheduled appointment time.   If you have a lab appointment with the [facility] please come in thru the Main Entrance and check in at the main information desk.  You need to re-schedule your appointment should you arrive 10 or more minutes late.  We strive to give you quality time with our providers, and arriving late affects you and other patients whose appointments are after yours.  Also, if you no show three or more times for appointments you may be dismissed from the clinic at the providers discretion.     Again, thank you for choosing [facility].  Our hope is that these requests will decrease the amount of time that you wait before being seen by our physicians.       _____________________________________________________________  Should you have questions after  your visit to [facility], please contact our office at [phone] and follow the prompts.  Our office hours are 8:00 a.m. and 4:30 p.m. Monday - Friday.  Please note that voicemails left after 4:00 p.m. may not be returned until the following business day.  We are closed weekends and major holidays.  You do have access to a nurse 24-7, just call the main number to the clinic [phone] and do not press any options, hold on the line and a nurse will answer the phone.    For prescription refill requests, have your pharmacy contact our office and allow 72 hours.    Due to Covid, you will need to wear a mask upon entering the hospital. If you do not have a mask, a mask will be given to you at the Main Entrance upon arrival. For doctor visits, patients may have 1 support person age 47 or older with them. For treatment visits, patients can not have anyone with them due to social distancing guidelines and our immunocompromised population.

## 2020-05-13 ENCOUNTER — Telehealth (HOSPITAL_COMMUNITY): Payer: Self-pay | Admitting: Hematology

## 2020-05-13 ENCOUNTER — Encounter: Payer: Self-pay | Admitting: General Surgery

## 2020-05-13 ENCOUNTER — Other Ambulatory Visit: Payer: Self-pay

## 2020-05-13 ENCOUNTER — Ambulatory Visit (INDEPENDENT_AMBULATORY_CARE_PROVIDER_SITE_OTHER): Payer: Medicare Other | Admitting: General Surgery

## 2020-05-13 VITALS — BP 95/67 | HR 54 | Temp 98.0°F | Resp 12 | Ht 71.0 in | Wt 148.0 lb

## 2020-05-13 DIAGNOSIS — C259 Malignant neoplasm of pancreas, unspecified: Secondary | ICD-10-CM

## 2020-05-13 DIAGNOSIS — C787 Secondary malignant neoplasm of liver and intrahepatic bile duct: Secondary | ICD-10-CM | POA: Diagnosis not present

## 2020-05-13 NOTE — Telephone Encounter (Signed)
PC TO TRANSPORTATION SERVICES TO SET UP RIDE FOR PT ON 8/24 TO [facility] FOR A PET SCAN. PER JO PT WILL BE CONTACTED TO SET UP PICK UP/DROP OFF.

## 2020-05-13 NOTE — Patient Instructions (Signed)

## 2020-05-13 NOTE — Progress Notes (Signed)
John Dacanay Sr.; [id number]; August 10, 1951   HPI Patient is a 69 year old white male who was referred to my care by Dr. Delton Coombes for Port-A-Cath insertion.  He is about to undergo chemotherapy for metastatic pancreatic cancer. Past Medical History:  Diagnosis Date  . Arthritis   . Diabetes mellitus without complication ([place])   . Emesis   . Family history of cervical cancer   . Heart murmur   . Hyperglycemia   . Hypertension   . Joint pain     Past Surgical History:  Procedure Laterality Date  . AMPUTATION Left 08/02/2018   Procedure: LEFT AMPUTATION BELOW KNEE;  Surgeon: Marty Heck, MD;  Location: [place];  Service: Vascular;  Laterality: Left;  . ELBOW SURGERY     Bone graft to repair elbow  . LOWER EXTREMITY ANGIOGRAPHY N/A 07/24/2018   Procedure: LOWER EXTREMITY ANGIOGRAPHY;  Surgeon: Marty Heck, MD;  Location: [place] CV LAB;  Service: Cardiovascular;  Laterality: N/A;  . PERIPHERAL VASCULAR INTERVENTION  07/24/2018   Procedure: PERIPHERAL VASCULAR INTERVENTION;  Surgeon: Marty Heck, MD;  Location: [place] CV LAB;  Service: Cardiovascular;;  . TONSILLECTOMY    . TRANSMETATARSAL AMPUTATION Left 07/25/2018   Procedure: LEFT GREAT TOE AMPUTATION;  Surgeon: Marty Heck, MD;  Location: [place];  Service: Vascular;  Laterality: Left;  . TRANSMETATARSAL AMPUTATION Left 07/30/2018   Procedure: TRANSMETATARSAL AMPUTATION;  Surgeon: Waynetta Sandy, MD;  Location: [facility] OR;  Service: Vascular;  Laterality: Left;    Family History  Problem Relation Age of Onset  . Alcoholism Mother   . Alcoholism Father   . Cervical cancer Sister        dx. late 52s  . Cervical cancer Sister        dx. late 81s    Current Outpatient Medications on File Prior to Visit  Medication Sig Dispense Refill  . acetaminophen (TYLENOL) 325 MG tablet Take 2 tablets (650 mg total) by mouth every 6 (six) hours as needed for mild pain, moderate pain or fever. 30  tablet 0  . aspirin EC 81 MG EC tablet Take 1 tablet (81 mg total) by mouth daily. 30 tablet 0  . bisacodyl (DULCOLAX) 5 MG EC tablet Take 1 tablet (5 mg total) by mouth daily as needed for moderate constipation. 30 tablet 0  . blood glucose meter kit and supplies KIT Dispense based on patient and insurance preference. Use up to four times daily as directed. (FOR ICD-9 250.00, 250.01). 1 each 0  . HYDROcodone-acetaminophen (NORCO/VICODIN) 5-325 MG tablet Take 1-2 tablets by mouth every 6 (six) hours as needed for moderate pain or severe pain. 30 tablet 0  . metFORMIN (GLUCOPHAGE) 500 MG tablet Take 1 tablet (500 mg total) by mouth 2 (two) times daily with a meal. 60 tablet 11   No current facility-administered medications on file prior to visit.    Allergies  Allergen Reactions  . Tea Itching and Rash    Social History   Substance and Sexual Activity  Alcohol Use Yes  . Alcohol/week: 12.0 standard drinks  . Types: 12 Cans of beer per week    Social History   Tobacco Use  Smoking Status Current Every Day Smoker  . Packs/day: 0.50  . Types: Cigarettes  Smokeless Tobacco Former Systems developer  . Types: Snuff  . Quit date: 04/21/1989    Review of Systems  Constitutional: Positive for malaise/fatigue.  HENT: Negative.   Eyes: Negative.   Respiratory: Positive for cough.  Cardiovascular: Negative.   Gastrointestinal: Positive for abdominal pain.  Genitourinary: Negative.   Musculoskeletal: Positive for joint pain.  Skin: Negative.   Neurological: Negative.   Endo/Heme/Allergies: Negative.   Psychiatric/Behavioral: Negative.     Objective   Vitals:   05/13/20 1149  BP: 95/67  Pulse: (!) 54  Resp: 12  Temp: 98 F (36.7 C)  SpO2: 93%    Physical Exam Vitals reviewed.  Constitutional:      Appearance: Normal appearance. He is normal weight. He is not ill-appearing.  HENT:     Head: Normocephalic and atraumatic.  Cardiovascular:     Rate and Rhythm: Normal rate and  regular rhythm.     Heart sounds: Normal heart sounds. No murmur heard.  No friction rub. No gallop.   Pulmonary:     Effort: Pulmonary effort is normal. No respiratory distress.     Breath sounds: Normal breath sounds. No stridor. No wheezing, rhonchi or rales.  Skin:    General: Skin is warm and dry.  Neurological:     Mental Status: He is alert and oriented to person, place, and time.    Oncology notes reviewed Assessment  Metastatic pancreatic carcinoma, need for central venous access Plan   Patient is scheduled for Port-A-Cath insertion on 05/21/2020.  The risks and benefits of the procedure including bleeding, infection, and pneumothorax were fully explained to the patient, who gave informed consent.

## 2020-05-13 NOTE — H&P (Signed)
John Eberlein Sr.; [id number]; 07/16/1951   HPI Patient is a 68-year-old white male who was referred to my care by Dr. Katragadda for Port-A-Cath insertion.  He is about to undergo chemotherapy for metastatic pancreatic cancer. Past Medical History:  Diagnosis Date  . Arthritis   . Diabetes mellitus without complication ([facility])   . Emesis   . Family history of cervical cancer   . Heart murmur   . Hyperglycemia   . Hypertension   . Joint pain     Past Surgical History:  Procedure Laterality Date  . AMPUTATION Left 08/02/2018   Procedure: LEFT AMPUTATION BELOW KNEE;  Surgeon: Clark, Christopher J, MD;  Location: [facility] OR;  Service: Vascular;  Laterality: Left;  . ELBOW SURGERY     Bone graft to repair elbow  . LOWER EXTREMITY ANGIOGRAPHY N/A 07/24/2018   Procedure: LOWER EXTREMITY ANGIOGRAPHY;  Surgeon: Clark, Christopher J, MD;  Location: [facility];  Service: Cardiovascular;  Laterality: N/A;  . PERIPHERAL VASCULAR INTERVENTION  07/24/2018   Procedure: PERIPHERAL VASCULAR INTERVENTION;  Surgeon: Clark, Christopher J, MD;  Location: [facility];  Service: Cardiovascular;;  . TONSILLECTOMY    . TRANSMETATARSAL AMPUTATION Left 07/25/2018   Procedure: LEFT GREAT TOE AMPUTATION;  Surgeon: Clark, Christopher J, MD;  Location: [facility] OR;  Service: Vascular;  Laterality: Left;  . TRANSMETATARSAL AMPUTATION Left 07/30/2018   Procedure: TRANSMETATARSAL AMPUTATION;  Surgeon: Cain, Brandon Christopher, MD;  Location: [facility] OR;  Service: Vascular;  Laterality: Left;    Family History  Problem Relation Age of Onset  . Alcoholism Mother   . Alcoholism Father   . Cervical cancer Sister        dx. late 50s  . Cervical cancer Sister        dx. late 50s    Current Outpatient Medications on File Prior to Visit  Medication Sig Dispense Refill  . acetaminophen (TYLENOL) 325 MG tablet Take 2 tablets (650 mg total) by mouth every 6 (six) hours as needed for mild pain, moderate pain or fever. 30  tablet 0  . aspirin EC 81 MG EC tablet Take 1 tablet (81 mg total) by mouth daily. 30 tablet 0  . bisacodyl (DULCOLAX) 5 MG EC tablet Take 1 tablet (5 mg total) by mouth daily as needed for moderate constipation. 30 tablet 0  . blood glucose meter kit and supplies KIT Dispense based on patient and insurance preference. Use up to four times daily as directed. (FOR ICD-9 250.00, 250.01). 1 each 0  . HYDROcodone-acetaminophen (NORCO/VICODIN) 5-325 MG tablet Take 1-2 tablets by mouth every 6 (six) hours as needed for moderate pain or severe pain. 30 tablet 0  . metFORMIN (GLUCOPHAGE) 500 MG tablet Take 1 tablet (500 mg total) by mouth 2 (two) times daily with a meal. 60 tablet 11   No current facility-administered medications on file prior to visit.    Allergies  Allergen Reactions  . Tea Itching and Rash    Social History   Substance and Sexual Activity  Alcohol Use Yes  . Alcohol/week: 12.0 standard drinks  . Types: 12 Cans of beer per week    Social History   Tobacco Use  Smoking Status Current Every Day Smoker  . Packs/day: 0.50  . Types: Cigarettes  Smokeless Tobacco Former User  . Types: Snuff  . Quit date: 04/21/1989    Review of Systems  Constitutional: Positive for malaise/fatigue.  HENT: Negative.   Eyes: Negative.   Respiratory: Positive for cough.     Cardiovascular: Negative.   Gastrointestinal: Positive for abdominal pain.  Genitourinary: Negative.   Musculoskeletal: Positive for joint pain.  Skin: Negative.   Neurological: Negative.   Endo/Heme/Allergies: Negative.   Psychiatric/Behavioral: Negative.     Objective   Vitals:   05/13/20 1149  BP: 95/67  Pulse: (!) 54  Resp: 12  Temp: 98 F (36.7 C)  SpO2: 93%    Physical Exam Vitals reviewed.  Constitutional:      Appearance: Normal appearance. He is normal weight. He is not ill-appearing.  HENT:     Head: Normocephalic and atraumatic.  Cardiovascular:     Rate and Rhythm: Normal rate and  regular rhythm.     Heart sounds: Normal heart sounds. No murmur heard.  No friction rub. No gallop.   Pulmonary:     Effort: Pulmonary effort is normal. No respiratory distress.     Breath sounds: Normal breath sounds. No stridor. No wheezing, rhonchi or rales.  Skin:    General: Skin is warm and dry.  Neurological:     Mental Status: He is alert and oriented to person, place, and time.    Oncology notes reviewed Assessment  Metastatic pancreatic carcinoma, need for central venous access Plan   Patient is scheduled for Port-A-Cath insertion on 05/21/2020.  The risks and benefits of the procedure including bleeding, infection, and pneumothorax were fully explained to the patient, who gave informed consent. 

## 2020-05-18 ENCOUNTER — Encounter (HOSPITAL_COMMUNITY)
Admission: RE | Admit: 2020-05-18 | Discharge: 2020-05-18 | Disposition: A | Discharge status: Home / Self Care | Payer: Medicare Other | Source: Ambulatory Visit | Attending: General Surgery | Admitting: General Surgery

## 2020-05-18 ENCOUNTER — Other Ambulatory Visit: Payer: Self-pay

## 2020-05-18 DIAGNOSIS — Z01818 Encounter for other preprocedural examination: Secondary | ICD-10-CM | POA: Insufficient documentation

## 2020-05-18 NOTE — Patient Instructions (Signed)
John Pagliuca Sr.  05/18/2020     @PREFPERIOPPHARMACY @   Your procedure is scheduled on 05/25/2020.  Report to [name] at   [email or web address].  Call this number if you have problems the morning of surgery:  [phone]   Remember:  Do not eat or drink after midnight.                         Take these medicines the morning of surgery with A SIP OF WATER  Wellbutrin, prilosec.    Do not wear jewelry, make-up or nail polish.  Do not wear lotions, powders, or perfumes, or deodorant. Please brush your teeth.  Do not shave 48 hours prior to surgery.  Men may shave face and neck.  Do not bring valuables to the hospital.  [facility] is not responsible for any belongings or valuables.  Contacts, dentures or bridgework may not be worn into surgery.  Leave your suitcase in the car.  After surgery it may be brought to your room.  For patients admitted to the hospital, discharge time will be determined by your treatment team.  Patients discharged the day of surgery will not be allowed to drive home.   Name and phone number of your driver:   FAMILY Special instructions:   DO NOT smoke the morning of your procedure. Please read over the following fact sheets that you were given. Anesthesia Post-op Instructions and Care and Recovery After Surgery       Implanted Port Insertion, Care After This sheet gives you information about how to care for yourself after your procedure. Your health care provider may also give you more specific instructions. If you have problems or questions, contact your health care provider. What can I expect after the procedure? After the procedure, it is common to have:  Discomfort at the port insertion site.  Bruising on the skin over the port. This should improve over 3-4 days. Follow these instructions at home: [facility] care  After your port is placed, you will get a manufacturer's information card. The card has information about your port. Keep  this card with you at all times.  Take care of the port as told by your health care provider. Ask your health care provider if you or a family member can get training for taking care of the port at home. A home health care nurse may also take care of the port.  Make sure to remember what type of port you have. Incision care      Follow instructions from your health care provider about how to take care of your port insertion site. Make sure you: ? Wash your hands with soap and water before and after you change your bandage (dressing). If soap and water are not available, use hand sanitizer. ? Change your dressing as told by your health care provider. ? Leave stitches (sutures), skin glue, or adhesive strips in place. These skin closures may need to stay in place for 2 weeks or longer. If adhesive strip edges start to loosen and curl up, you may trim the loose edges. Do not remove adhesive strips completely unless your health care provider tells you to do that.  Check your port insertion site every day for signs of infection. Check for: ? Redness, swelling, or pain. ? Fluid or blood. ? Warmth. ? Pus or a bad smell. Activity  Return to your normal  activities as told by your health care provider. Ask your health care provider what activities are safe for you.  Do not lift anything that is heavier than 10 lb (4.5 kg), or the limit that you are told, until your health care provider says that it is safe. General instructions  Take over-the-counter and prescription medicines only as told by your health care provider.  Do not take baths, swim, or use a hot tub until your health care provider approves. Ask your health care provider if you may take showers. You may only be allowed to take sponge baths.  Do not drive for 24 hours if you were given a sedative during your procedure.  Wear a medical alert bracelet in case of an emergency. This will tell any health care providers that you have a  port.  Keep all follow-up visits as told by your health care provider. This is important. Contact a health care provider if:  You cannot flush your port with saline as directed, or you cannot draw blood from the port.  You have a fever or chills.  You have redness, swelling, or pain around your port insertion site.  You have fluid or blood coming from your port insertion site.  Your port insertion site feels warm to the touch.  You have pus or a bad smell coming from the port insertion site. Get help right away if:  You have chest pain or shortness of breath.  You have bleeding from your port that you cannot control. Summary  Take care of the port as told by your health care provider. Keep the manufacturer's information card with you at all times.  Change your dressing as told by your health care provider.  Contact a health care provider if you have a fever or chills or if you have redness, swelling, or pain around your port insertion site.  Keep all follow-up visits as told by your health care provider. This information is not intended to replace advice given to you by your health care provider. Make sure you discuss any questions you have with your health care provider. Document Revised: 04/09/2018 Document Reviewed: 04/09/2018 Elsevier Patient Education  [place] An implanted port is a device that is placed under the skin. It is usually placed in the chest. The device can be used to give IV medicine, to take blood, or for dialysis. You may have an implanted port if:  You need IV medicine that would be irritating to the small veins in your hands or arms.  You need IV medicines, such as antibiotics, for a long period of time.  You need IV nutrition for a long period of time.  You need dialysis. Having a port means that your health care provider will not need to use the veins in your arms for these procedures. You may have fewer  limitations when using a port than you would if you used other types of long-term IVs, and you will likely be able to return to normal activities after your incision heals. An implanted port has two main parts:  Reservoir. The reservoir is the part where a needle is inserted to give medicines or draw blood. The reservoir is round. After it is placed, it appears as a small, raised area under your skin.  Catheter. The catheter is a thin, flexible tube that connects the reservoir to a vein. Medicine that is inserted into the reservoir goes into the catheter and then into the  vein. How is my port accessed? To access your port:  A numbing cream may be placed on the skin over the port site.  Your health care provider will put on a mask and sterile gloves.  The skin over your port will be cleaned carefully with a germ-killing soap and allowed to dry.  Your health care provider will gently pinch the port and insert a needle into it.  Your health care provider will check for a blood return to make sure the port is in the vein and is not clogged.  If your port needs to remain accessed to get medicine continuously (constant infusion), your health care provider will place a clear bandage (dressing) over the needle site. The dressing and needle will need to be changed every week, or as told by your health care provider. What is flushing? Flushing helps keep the port from getting clogged. Follow instructions from your health care provider about how and when to flush the port. Ports are usually flushed with saline solution or a medicine called heparin. The need for flushing will depend on how the port is used:  If the port is only used from time to time to give medicines or draw blood, the port may need to be flushed: ? Before and after medicines have been given. ? Before and after blood has been drawn. ? As part of routine maintenance. Flushing may be recommended every 4-6 weeks.  If a constant  infusion is running, the port may not need to be flushed.  Throw away any syringes in a disposal container that is meant for sharp items (sharps container). You can buy a sharps container from a pharmacy, or you can make one by using an empty hard plastic bottle with a cover. How long will my port stay implanted? The port can stay in for as long as your health care provider thinks it is needed. When it is time for the port to come out, a surgery will be done to remove it. The surgery will be similar to the procedure that was done to put the port in. Follow these instructions at home:   Flush your port as told by your health care provider.  If you need an infusion over several days, follow instructions from your health care provider about how to take care of your port site. Make sure you: ? Wash your hands with soap and water before you change your dressing. If soap and water are not available, use alcohol-based hand sanitizer. ? Change your dressing as told by your health care provider. ? Place any used dressings or infusion bags into a plastic bag. Throw that bag in the trash. ? Keep the dressing that covers the needle clean and dry. Do not get it wet. ? Do not use scissors or sharp objects near the tube. ? Keep the tube clamped, unless it is being used.  Check your port site every day for signs of infection. Check for: ? Redness, swelling, or pain. ? Fluid or blood. ? Pus or a bad smell.  Protect the skin around the port site. ? Avoid wearing bra straps that rub or irritate the site. ? Protect the skin around your port from seat belts. Place a soft pad over your chest if needed.  Bathe or shower as told by your health care provider. The site may get wet as long as you are not actively receiving an infusion.  Return to your normal activities as told by your health care provider.  Ask your health care provider what activities are safe for you.  Carry a medical alert card or wear a  medical alert bracelet at all times. This will let health care providers know that you have an implanted port in case of an emergency. Get help right away if:  You have redness, swelling, or pain at the port site.  You have fluid or blood coming from your port site.  You have pus or a bad smell coming from the port site.  You have a fever. Summary  Implanted ports are usually placed in the chest for long-term IV access.  Follow instructions from your health care provider about flushing the port and changing bandages (dressings).  Take care of the area around your port by avoiding clothing that puts pressure on the area, and by watching for signs of infection.  Protect the skin around your port from seat belts. Place a soft pad over your chest if needed.  Get help right away if you have a fever or you have redness, swelling, pain, drainage, or a bad smell at the port site. This information is not intended to replace advice given to you by your health care provider. Make sure you discuss any questions you have with your health care provider. Document Revised: 01/03/2019 Document Reviewed: 10/14/2016 Elsevier Patient Education  2020 [place] After These instructions provide you with information about caring for yourself after your procedure. Your health care provider may also give you more specific instructions. Your treatment has been planned according to current medical practices, but problems sometimes occur. Call your health care provider if you have any problems or questions after your procedure. What can I expect after the procedure? After your procedure, you may:  Feel sleepy for several hours.  Feel clumsy and have poor balance for several hours.  Feel forgetful about what happened after the procedure.  Have poor judgment for several hours.  Feel nauseous or vomit.  Have a sore throat if you had a breathing tube during the  procedure. Follow these instructions at home: For at least 24 hours after the procedure:      Have a responsible adult stay with you. It is important to have someone help care for you until you are awake and alert.  Rest as needed.  Do not: ? Participate in activities in which you could fall or become injured. ? Drive. ? Use heavy machinery. ? Drink alcohol. ? Take sleeping pills or medicines that cause drowsiness. ? Make important decisions or sign legal documents. ? Take care of children on your own. Eating and drinking  Follow the diet that is recommended by your health care provider.  If you vomit, drink water, juice, or soup when you can drink without vomiting.  Make sure you have little or no nausea before eating solid foods. General instructions  Take over-the-counter and prescription medicines only as told by your health care provider.  If you have sleep apnea, surgery and certain medicines can increase your risk for breathing problems. Follow instructions from your health care provider about wearing your sleep device: ? Anytime you are sleeping, including during daytime naps. ? While taking prescription pain medicines, sleeping medicines, or medicines that make you drowsy.  If you smoke, do not smoke without supervision.  Keep all follow-up visits as told by your health care provider. This is important. Contact a health care provider if:  You keep feeling nauseous or you keep vomiting.  You  feel light-headed.  You develop a rash.  You have a fever. Get help right away if:  You have trouble breathing. Summary  For several hours after your procedure, you may feel sleepy and have poor judgment.  Have a responsible adult stay with you for at least 24 hours or until you are awake and alert. This information is not intended to replace advice given to you by your health care provider. Make sure you discuss any questions you have with your health care  provider. Document Revised: 12/10/2017 Document Reviewed: 01/02/2016 Elsevier Patient Education  [place]. How to Use Chlorhexidine for Bathing Chlorhexidine gluconate (CHG) is a germ-killing (antiseptic) solution that is used to clean the skin. It can get rid of the bacteria that normally live on the skin and can keep them away for about 24 hours. To clean your skin with CHG, you may be given:  A CHG solution to use in the shower or as part of a sponge bath.  A prepackaged cloth that contains CHG. Cleaning your skin with CHG may help lower the risk for infection:  While you are staying in the intensive care unit of the hospital.  If you have a vascular access, such as a central line, to provide short-term or long-term access to your veins.  If you have a catheter to drain urine from your bladder.  If you are on a ventilator. A ventilator is a machine that helps you breathe by moving air in and out of your lungs.  After surgery. What are the risks? Risks of using CHG include:  A skin reaction.  Hearing loss, if CHG gets in your ears.  Eye injury, if CHG gets in your eyes and is not rinsed out.  The CHG product catching fire. Make sure that you avoid smoking and flames after applying CHG to your skin. Do not use CHG:  If you have a chlorhexidine allergy or have previously reacted to chlorhexidine.  On babies younger than 79 months of age. How to use CHG solution  Use CHG only as told by your health care provider, and follow the instructions on the label.  Use the full amount of CHG as directed. Usually, this is one bottle. During a shower Follow these steps when using CHG solution during a shower (unless your health care provider gives you different instructions): 1. Start the shower. 2. Use your normal soap and shampoo to wash your face and hair. 3. Turn off the shower or move out of the shower stream. 4. Pour the CHG onto a clean washcloth. Do not use any type  of brush or rough-edged sponge. 5. Starting at your neck, lather your body down to your toes. Make sure you follow these instructions: ? If you will be having surgery, pay special attention to the part of your body where you will be having surgery. Scrub this area for at least 1 minute. ? Do not use CHG on your head or face. If the solution gets into your ears or eyes, rinse them well with water. ? Avoid your genital area. ? Avoid any areas of skin that have broken skin, cuts, or scrapes. ? Scrub your back and under your arms. Make sure to wash skin folds. 6. Let the lather sit on your skin for 1-2 minutes or as long as told by your health care provider. 7. Thoroughly rinse your entire body in the shower. Make sure that all body creases and crevices are rinsed well. 8. Dry off  with a clean towel. Do not put any substances on your body afterward--such as powder, lotion, or perfume--unless you are told to do so by your health care provider. Only use lotions that are recommended by the manufacturer. 9. Put on clean clothes or pajamas. 10. If it is the night before your surgery, sleep in clean sheets.  During a sponge bath Follow these steps when using CHG solution during a sponge bath (unless your health care provider gives you different instructions): 1. Use your normal soap and shampoo to wash your face and hair. 2. Pour the CHG onto a clean washcloth. 3. Starting at your neck, lather your body down to your toes. Make sure you follow these instructions: ? If you will be having surgery, pay special attention to the part of your body where you will be having surgery. Scrub this area for at least 1 minute. ? Do not use CHG on your head or face. If the solution gets into your ears or eyes, rinse them well with water. ? Avoid your genital area. ? Avoid any areas of skin that have broken skin, cuts, or scrapes. ? Scrub your back and under your arms. Make sure to wash skin folds. 4. Let the lather sit  on your skin for 1-2 minutes or as long as told by your health care provider. 5. Using a different clean, wet washcloth, thoroughly rinse your entire body. Make sure that all body creases and crevices are rinsed well. 6. Dry off with a clean towel. Do not put any substances on your body afterward--such as powder, lotion, or perfume--unless you are told to do so by your health care provider. Only use lotions that are recommended by the manufacturer. 7. Put on clean clothes or pajamas. 8. If it is the night before your surgery, sleep in clean sheets. How to use CHG prepackaged cloths  Only use CHG cloths as told by your health care provider, and follow the instructions on the label.  Use the CHG cloth on clean, dry skin.  Do not use the CHG cloth on your head or face unless your health care provider tells you to.  When washing with the CHG cloth: ? Avoid your genital area. ? Avoid any areas of skin that have broken skin, cuts, or scrapes. Before surgery Follow these steps when using a CHG cloth to clean before surgery (unless your health care provider gives you different instructions): 1. Using the CHG cloth, vigorously scrub the part of your body where you will be having surgery. Scrub using a back-and-forth motion for 3 minutes. The area on your body should be completely wet with CHG when you are done scrubbing. 2. Do not rinse. Discard the cloth and let the area air-dry. Do not put any substances on the area afterward, such as powder, lotion, or perfume. 3. Put on clean clothes or pajamas. 4. If it is the night before your surgery, sleep in clean sheets.  For general bathing Follow these steps when using CHG cloths for general bathing (unless your health care provider gives you different instructions). 1. Use a separate CHG cloth for each area of your body. Make sure you wash between any folds of skin and between your fingers and toes. Wash your body in the following order, switching to a  new cloth after each step: ? The front of your neck, shoulders, and chest. ? Both of your arms, under your arms, and your hands. ? Your stomach and groin area, avoiding the genitals. ?  Your right leg and foot. ? Your left leg and foot. ? The back of your neck, your back, and your buttocks. 2. Do not rinse. Discard the cloth and let the area air-dry. Do not put any substances on your body afterward--such as powder, lotion, or perfume--unless you are told to do so by your health care provider. Only use lotions that are recommended by the manufacturer. 3. Put on clean clothes or pajamas. Contact a health care provider if:  Your skin gets irritated after scrubbing.  You have questions about using your solution or cloth. Get help right away if:  Your eyes become very red or swollen.  Your eyes itch badly.  Your skin itches badly and is red or swollen.  Your hearing changes.  You have trouble seeing.  You have swelling or tingling in your mouth or throat.  You have trouble breathing.  You swallow any chlorhexidine. Summary  Chlorhexidine gluconate (CHG) is a germ-killing (antiseptic) solution that is used to clean the skin. Cleaning your skin with CHG may help to lower your risk for infection.  You may be given CHG to use for bathing. It may be in a bottle or in a prepackaged cloth to use on your skin. Carefully follow your health care provider's instructions and the instructions on the product label.  Do not use CHG if you have a chlorhexidine allergy.  Contact your health care provider if your skin gets irritated after scrubbing. This information is not intended to replace advice given to you by your health care provider. Make sure you discuss any questions you have with your health care provider. Document Revised: 11/28/2018 Document Reviewed: 08/09/2017 Elsevier Patient Education  [place].

## 2020-05-19 ENCOUNTER — Ambulatory Visit (HOSPITAL_COMMUNITY)
Admission: RE | Admit: 2020-05-19 | Discharge: 2020-05-19 | Disposition: A | Discharge status: Home / Self Care | Payer: Medicare Other | Source: Ambulatory Visit | Attending: Hematology | Admitting: Hematology

## 2020-05-19 DIAGNOSIS — I700 Atherosclerosis of aorta: Secondary | ICD-10-CM | POA: Insufficient documentation

## 2020-05-19 DIAGNOSIS — K8689 Other specified diseases of pancreas: Secondary | ICD-10-CM | POA: Insufficient documentation

## 2020-05-19 DIAGNOSIS — J439 Emphysema, unspecified: Secondary | ICD-10-CM | POA: Insufficient documentation

## 2020-05-19 DIAGNOSIS — I2510 Atherosclerotic heart disease of native coronary artery without angina pectoris: Secondary | ICD-10-CM | POA: Insufficient documentation

## 2020-05-19 LAB — GLUCOSE, CAPILLARY: Glucose-Capillary: 238 mg/dL — ABNORMAL HIGH (ref 70–99)

## 2020-05-19 MED ORDER — FLUDEOXYGLUCOSE F - 18 (FDG) INJECTION
6.9000 | Freq: Once | INTRAVENOUS | Status: AC
  Administered 2020-05-19: 6.9 via INTRAVENOUS

## 2020-05-20 ENCOUNTER — Other Ambulatory Visit (HOSPITAL_COMMUNITY)
Admission: RE | Admit: 2020-05-20 | Discharge: 2020-05-20 | Disposition: A | Discharge status: Home / Self Care | Payer: Medicare Other | Source: Ambulatory Visit | Attending: General Surgery | Admitting: General Surgery

## 2020-05-20 ENCOUNTER — Other Ambulatory Visit: Payer: Self-pay

## 2020-05-20 DIAGNOSIS — Z01812 Encounter for preprocedural laboratory examination: Secondary | ICD-10-CM | POA: Diagnosis present

## 2020-05-20 DIAGNOSIS — Z20822 Contact with and (suspected) exposure to covid-19: Secondary | ICD-10-CM | POA: Diagnosis not present

## 2020-05-21 ENCOUNTER — Ambulatory Visit (HOSPITAL_COMMUNITY): Payer: Medicare Other

## 2020-05-21 ENCOUNTER — Encounter (HOSPITAL_COMMUNITY)
Admission: RE | Disposition: A | Discharge status: Home / Self Care | Payer: Self-pay | Source: Home / Self Care | Attending: General Surgery

## 2020-05-21 ENCOUNTER — Ambulatory Visit (HOSPITAL_COMMUNITY): Payer: Medicare Other | Admitting: Certified Registered"

## 2020-05-21 ENCOUNTER — Encounter (HOSPITAL_COMMUNITY): Payer: Self-pay | Admitting: General Surgery

## 2020-05-21 ENCOUNTER — Ambulatory Visit (HOSPITAL_COMMUNITY)
Admission: RE | Admit: 2020-05-21 | Discharge: 2020-05-21 | Disposition: A | Discharge status: Home / Self Care | Payer: Medicare Other | Attending: General Surgery | Admitting: General Surgery

## 2020-05-21 DIAGNOSIS — Z95828 Presence of other vascular implants and grafts: Secondary | ICD-10-CM

## 2020-05-21 DIAGNOSIS — Z7982 Long term (current) use of aspirin: Secondary | ICD-10-CM | POA: Insufficient documentation

## 2020-05-21 DIAGNOSIS — C259 Malignant neoplasm of pancreas, unspecified: Secondary | ICD-10-CM | POA: Diagnosis not present

## 2020-05-21 DIAGNOSIS — F17210 Nicotine dependence, cigarettes, uncomplicated: Secondary | ICD-10-CM | POA: Diagnosis not present

## 2020-05-21 DIAGNOSIS — C787 Secondary malignant neoplasm of liver and intrahepatic bile duct: Secondary | ICD-10-CM | POA: Diagnosis not present

## 2020-05-21 DIAGNOSIS — E119 Type 2 diabetes mellitus without complications: Secondary | ICD-10-CM | POA: Diagnosis not present

## 2020-05-21 DIAGNOSIS — Z7984 Long term (current) use of oral hypoglycemic drugs: Secondary | ICD-10-CM | POA: Insufficient documentation

## 2020-05-21 DIAGNOSIS — I10 Essential (primary) hypertension: Secondary | ICD-10-CM | POA: Insufficient documentation

## 2020-05-21 HISTORY — PX: PORTACATH PLACEMENT: SHX2246

## 2020-05-21 LAB — GLUCOSE, CAPILLARY
Glucose-Capillary: 263 mg/dL — ABNORMAL HIGH (ref 70–99)
Glucose-Capillary: 269 mg/dL — ABNORMAL HIGH (ref 70–99)

## 2020-05-21 LAB — SARS CORONAVIRUS 2 (TAT 6-24 HRS): SARS Coronavirus 2: NEGATIVE

## 2020-05-21 SURGERY — INSERTION, TUNNELED CENTRAL VENOUS DEVICE, WITH PORT
Anesthesia: General | Site: Chest | Laterality: Left

## 2020-05-21 MED ORDER — FENTANYL CITRATE (PF) 100 MCG/2ML IJ SOLN
INTRAMUSCULAR | Status: DC | PRN
Start: 2020-05-21 — End: 2020-05-21
  Administered 2020-05-21 (×4): 25 ug via INTRAVENOUS

## 2020-05-21 MED ORDER — CHLORHEXIDINE GLUCONATE CLOTH 2 % EX PADS: 6.0000 | MEDICATED_PAD | Freq: Once | CUTANEOUS | Status: DC

## 2020-05-21 MED ORDER — CEFAZOLIN SODIUM-DEXTROSE 2-4 GM/100ML-% IV SOLN
2.0000 g | INTRAVENOUS | Status: AC
  Administered 2020-05-21: 2 g via INTRAVENOUS

## 2020-05-21 MED ORDER — MIDAZOLAM HCL 2 MG/2ML IJ SOLN
INTRAMUSCULAR | Status: AC
  Filled 2020-05-21: qty 2

## 2020-05-21 MED ORDER — MIDAZOLAM HCL 5 MG/5ML IJ SOLN
INTRAMUSCULAR | Status: DC | PRN
  Administered 2020-05-21: 2 mg via INTRAVENOUS

## 2020-05-21 MED ORDER — PROPOFOL 10 MG/ML IV BOLUS
INTRAVENOUS | Status: AC
  Filled 2020-05-21: qty 40

## 2020-05-21 MED ORDER — SODIUM CHLORIDE (PF) 0.9 % IJ SOLN
INTRAMUSCULAR | Status: DC | PRN
  Administered 2020-05-21: 500 mL

## 2020-05-21 MED ORDER — HYDROCODONE-ACETAMINOPHEN 5-325 MG PO TABS
1.0000 | ORAL_TABLET | ORAL | 0 refills | Status: DC | PRN
Start: 2020-05-21 — End: 2020-06-11

## 2020-05-21 MED ORDER — ONDANSETRON HCL 4 MG/2ML IJ SOLN: 4.0000 mg | Freq: Once | INTRAMUSCULAR | Status: DC | PRN

## 2020-05-21 MED ORDER — LIDOCAINE HCL (PF) 1 % IJ SOLN
INTRAMUSCULAR | Status: AC
  Filled 2020-05-21: qty 30

## 2020-05-21 MED ORDER — ORAL CARE MOUTH RINSE: 15.0000 mL | Freq: Once | OROMUCOSAL | Status: AC

## 2020-05-21 MED ORDER — HEPARIN SOD (PORK) LOCK FLUSH 100 UNIT/ML IV SOLN
INTRAVENOUS | Status: DC | PRN
  Administered 2020-05-21: 500 [IU]

## 2020-05-21 MED ORDER — FENTANYL CITRATE (PF) 100 MCG/2ML IJ SOLN
INTRAMUSCULAR | Status: AC
  Filled 2020-05-21: qty 2

## 2020-05-21 MED ORDER — PROPOFOL 10 MG/ML IV BOLUS
INTRAVENOUS | Status: DC | PRN
  Administered 2020-05-21: 40 mg via INTRAVENOUS
  Administered 2020-05-21: 125 ug/kg/min via INTRAVENOUS

## 2020-05-21 MED ORDER — FENTANYL CITRATE (PF) 100 MCG/2ML IJ SOLN: 25.0000 ug | INTRAMUSCULAR | Status: DC | PRN

## 2020-05-21 MED ORDER — LIDOCAINE HCL (PF) 1 % IJ SOLN
INTRAMUSCULAR | Status: DC | PRN
  Administered 2020-05-21: 6 mL

## 2020-05-21 MED ORDER — LACTATED RINGERS IV SOLN
INTRAVENOUS | Status: DC
  Administered 2020-05-21: 1000 mL via INTRAVENOUS

## 2020-05-21 MED ORDER — CHLORHEXIDINE GLUCONATE 0.12 % MT SOLN
15.0000 mL | Freq: Once | OROMUCOSAL | Status: AC
  Administered 2020-05-21: 15 mL via OROMUCOSAL

## 2020-05-21 MED ORDER — KETOROLAC TROMETHAMINE 30 MG/ML IJ SOLN
15.0000 mg | Freq: Once | INTRAMUSCULAR | Status: AC
  Administered 2020-05-21: 15 mg via INTRAVENOUS
  Filled 2020-05-21: qty 1

## 2020-05-21 MED ORDER — HEPARIN SOD (PORK) LOCK FLUSH 100 UNIT/ML IV SOLN
INTRAVENOUS | Status: AC
  Filled 2020-05-21: qty 5

## 2020-05-21 SURGICAL SUPPLY — 31 items
ADH SKN CLS APL DERMABOND .7 (GAUZE/BANDAGES/DRESSINGS) ×1
APL PRP STRL LF ISPRP CHG 10.5 (MISCELLANEOUS) ×1
APPLICATOR CHLORAPREP 10.5 ORG (MISCELLANEOUS) ×3 IMPLANT
BAG DECANTER FOR FLEXI CONT (MISCELLANEOUS) ×3 IMPLANT
CLOTH BEACON ORANGE TIMEOUT ST (SAFETY) ×3 IMPLANT
COVER LIGHT HANDLE STERIS (MISCELLANEOUS) ×6 IMPLANT
COVER WAND RF STERILE (DRAPES) ×3 IMPLANT
DECANTER SPIKE VIAL GLASS SM (MISCELLANEOUS) ×3 IMPLANT
DERMABOND ADVANCED (GAUZE/BANDAGES/DRESSINGS) ×2
DERMABOND ADVANCED .7 DNX12 (GAUZE/BANDAGES/DRESSINGS) ×1 IMPLANT
DRAPE C-ARM FOLDED MOBILE STRL (DRAPES) ×3 IMPLANT
ELECT REM PT RETURN 9FT ADLT (ELECTROSURGICAL) ×3
ELECTRODE REM PT RTRN 9FT ADLT (ELECTROSURGICAL) ×1 IMPLANT
GLOVE BIOGEL PI IND STRL 7.0 (GLOVE) ×2 IMPLANT
GLOVE BIOGEL PI INDICATOR 7.0 (GLOVE) ×4
GLOVE ECLIPSE 6.5 STRL STRAW (GLOVE) ×3 IMPLANT
GLOVE SURG SS PI 7.5 STRL IVOR (GLOVE) ×3 IMPLANT
GOWN STRL REUS W/TWL LRG LVL3 (GOWN DISPOSABLE) ×6 IMPLANT
IV NS 500ML (IV SOLUTION) ×3
IV NS 500ML BAXH (IV SOLUTION) ×1 IMPLANT
KIT PORT POWER 8FR ISP MRI (Port) ×3 IMPLANT
KIT TURNOVER KIT A (KITS) ×3 IMPLANT
NEEDLE HYPO 25X1 1.5 SAFETY (NEEDLE) ×3 IMPLANT
PACK MINOR (CUSTOM PROCEDURE TRAY) ×3 IMPLANT
PAD ARMBOARD 7.5X6 YLW CONV (MISCELLANEOUS) ×3 IMPLANT
SET BASIN LINEN APH (SET/KITS/TRAYS/PACK) ×3 IMPLANT
SUT MNCRL AB 4-0 PS2 18 (SUTURE) ×3 IMPLANT
SUT VIC AB 3-0 SH 27 (SUTURE) ×3
SUT VIC AB 3-0 SH 27X BRD (SUTURE) ×1 IMPLANT
SYR 5ML LL (SYRINGE) ×3 IMPLANT
SYR CONTROL 10ML LL (SYRINGE) ×3 IMPLANT

## 2020-05-21 NOTE — Anesthesia Postprocedure Evaluation (Signed)
Anesthesia Post Note  Patient: John Motyka Sr.  Procedure(s) Performed: INSERTION PORT-A-CATH (Left )  Patient location during evaluation: PACU Anesthesia Type: General Level of consciousness: awake, oriented, awake and alert and patient cooperative Pain management: pain level controlled Vital Signs Assessment: post-procedure vital signs reviewed and stable Respiratory status: spontaneous breathing, respiratory function stable and nonlabored ventilation Cardiovascular status: blood pressure returned to baseline and stable Postop Assessment: no headache and no backache Anesthetic complications: no   No complications documented.   Last Vitals:  Vitals:   05/21/20 0647  BP: (!) 143/92  Resp: 18  Temp: 36.5 C  SpO2: 94%    Last Pain:  Vitals:   05/21/20 0647  TempSrc: Oral  PainSc: 7                  Tacy Learn

## 2020-05-21 NOTE — Anesthesia Preprocedure Evaluation (Signed)
Anesthesia Evaluation  Patient identified by MRN, date of birth, ID band Patient awake    Reviewed: Allergy & Precautions, H&P , NPO status , Patient's Chart, lab work & pertinent test results, reviewed documented beta blocker date and time   Airway Mallampati: II  TM Distance: >3 FB Neck ROM: full    Dental no notable dental hx. (+) Edentulous Upper, Edentulous Lower   Pulmonary neg pulmonary ROS, Current Smoker,    Pulmonary exam normal breath sounds clear to auscultation       Cardiovascular Exercise Tolerance: Good hypertension, negative cardio ROS   Rhythm:regular Rate:Normal     Neuro/Psych  Neuromuscular disease negative psych ROS   GI/Hepatic negative GI ROS, Neg liver ROS,   Endo/Other  negative endocrine ROSdiabetes  Renal/GU negative Renal ROS  negative genitourinary   Musculoskeletal   Abdominal   Peds  Hematology  (+) Blood dyscrasia, anemia ,   Anesthesia Other Findings   Reproductive/Obstetrics negative OB ROS                             Anesthesia Physical Anesthesia Plan  ASA: III  Anesthesia Plan: General   Post-op Pain Management:    Induction:   PONV Risk Score and Plan:   Airway Management Planned:   Additional Equipment:   Intra-op Plan:   Post-operative Plan:   Informed Consent: I have reviewed the patients History and Physical, chart, labs and discussed the procedure including the risks, benefits and alternatives for the proposed anesthesia with the patient or authorized representative who has indicated his/her understanding and acceptance.     Dental Advisory Given  Plan Discussed with: CRNA  Anesthesia Plan Comments:         Anesthesia Quick Evaluation

## 2020-05-21 NOTE — Interval H&P Note (Signed)
History and Physical Interval Note:  05/21/2020 7:17 AM  John Islam Sr.  has presented today for surgery, with the diagnosis of Pancreatic cancer.  The various methods of treatment have been discussed with the patient and family. After consideration of risks, benefits and other options for treatment, the patient has consented to  Procedure(s) with comments: INSERTION PORT-A-CATH (Left) - spoke with patient's wife, they know to arrive at 6:15 as a surgical intervention.  The patient's history has been reviewed, patient examined, no change in status, stable for surgery.  I have reviewed the patient's chart and labs.  Questions were answered to the patient's satisfaction.     Aviva Signs

## 2020-05-21 NOTE — Transfer of Care (Signed)
Immediate Anesthesia Transfer of Care Note  Patient: John Islam Sr.  Procedure(s) Performed: INSERTION PORT-A-CATH (Left )  Patient Location: PACU  Anesthesia Type:MAC  Level of Consciousness: awake, alert , oriented and patient cooperative  Airway & Oxygen Therapy: Patient Spontanous Breathing and Patient connected to nasal cannula oxygen  Post-op Assessment: Report given to RN, Post -op Vital signs reviewed and stable and Patient moving all extremities  Post vital signs: Reviewed and stable  Last Vitals:  Vitals Value Taken Time  BP    Temp    Pulse    Resp    SpO2      Last Pain:  Vitals:   05/21/20 0647  TempSrc: Oral  PainSc: 7       Patients Stated Pain Goal: 6 (08/11/34 6701)  Complications: No complications documented.

## 2020-05-21 NOTE — Op Note (Signed)
Patient:  John Ducat Sr.  DOB:  [birth year]  MRN:  [id number]   Preop Diagnosis: Metastatic pancreatic carcinoma  Postop Diagnosis: Same  Procedure: Port-A-Cath insertion  Surgeon: Aviva Signs, MD  Anes: MAC  Indications: Patient is a 69 year old black male who presents for Port-A-Cath.  He is about to undergo chemotherapy for metastatic pancreatic carcinoma.  The risks and benefits of the procedure including bleeding, infection, and pneumothorax were fully explained to the patient, who gave informed consent.  Procedure note: The patient was placed in Trendelenburg position after the left upper chest was prepped and draped using the usual sterile technique with ChloraPrep.  Surgical site confirmation was performed.  1% Xylocaine was used for local anesthesia.  An incision was made below the left clavicle.  A subcutaneous pocket was formed.  A needle was advanced into the left subclavian vein using the Seldinger technique without difficulty.  A guidewire was then advanced into the right atrium under fluoroscopic guidance.  An introducer peel-away sheath were placed over the guidewire.  The catheter was inserted through the peel-away sheath and the peel-away sheath was removed.  The catheter was then attached to the port and the port placed in subcutaneous pocket.  Adequate positioning was confirmed by fluoroscopy.  Good backflow of venous blood was noted on aspiration of the port.  The port was flushed with heparin flush.  The subcutaneous layer was reapproximated using a 3-0 Vicryl interrupted suture.  The skin was closed using a 4-0 Monocryl subcuticular suture.  Dermabond was applied.  All tape and needle counts were correct at the end of the procedure.  The patient was awakened and transferred to PACU in stable condition.  A chest x-ray will be performed at that time.  Complications: None  EBL: Minimal  Specimen: None

## 2020-05-21 NOTE — Discharge Instructions (Signed)
Monitored Anesthesia Care, Care After These instructions provide you with information about caring for yourself after your procedure. Your health care provider may also give you more specific instructions. Your treatment has been planned according to current medical practices, but problems sometimes occur. Call your health care provider if you have any problems or questions after your procedure. What can I expect after the procedure? After your procedure, you may: Feel sleepy for several hours. Feel clumsy and have poor balance for several hours. Feel forgetful about what happened after the procedure. Have poor judgment for several hours. Feel nauseous or vomit. Have a sore throat if you had a breathing tube during the procedure. Follow these instructions at home: For at least 24 hours after the procedure:     Have a responsible adult stay with you. It is important to have someone help care for you until you are awake and alert. Rest as needed. Do not: Participate in activities in which you could fall or become injured. Drive. Use heavy machinery. Drink alcohol. Take sleeping pills or medicines that cause drowsiness. Make important decisions or sign legal documents. Take care of children on your own. Eating and drinking Follow the diet that is recommended by your health care provider. If you vomit, drink water, juice, or soup when you can drink without vomiting. Make sure you have little or no nausea before eating solid foods. General instructions Take over-the-counter and prescription medicines only as told by your health care provider. If you have sleep apnea, surgery and certain medicines can increase your risk for breathing problems. Follow instructions from your health care provider about wearing your sleep device: Anytime you are sleeping, including during daytime naps. While taking prescription pain medicines, sleeping medicines, or medicines that make you drowsy. If you  smoke, do not smoke without supervision. Keep all follow-up visits as told by your health care provider. This is important. Contact a health care provider if: You keep feeling nauseous or you keep vomiting. You feel light-headed. You develop a rash. You have a fever. Get help right away if: You have trouble breathing. Summary For several hours after your procedure, you may feel sleepy and have poor judgment. Have a responsible adult stay with you for at least 24 hours or until you are awake and alert. This information is not intended to replace advice given to you by your health care provider. Make sure you discuss any questions you have with your health care provider. Document Revised: 12/10/2017 Document Reviewed: 01/02/2016 Elsevier Patient Education  [place] An implanted port is a device that is placed under the skin. It is usually placed in the chest. The device can be used to give IV medicine, to take blood, or for dialysis. You may have an implanted port if:  You need IV medicine that would be irritating to the small veins in your hands or arms.  You need IV medicines, such as antibiotics, for a long period of time.  You need IV nutrition for a long period of time.  You need dialysis. Having a port means that your health care provider will not need to use the veins in your arms for these procedures. You may have fewer limitations when using a port than you would if you used other types of long-term IVs, and you will likely be able to return to normal activities after your incision heals. An implanted port has two main parts:  Reservoir. The reservoir is the part where  a needle is inserted to give medicines or draw blood. The reservoir is round. After it is placed, it appears as a small, raised area under your skin.  Catheter. The catheter is a thin, flexible tube that connects the reservoir to a vein. Medicine that is inserted into the  reservoir goes into the catheter and then into the vein. How is my port accessed? To access your port:  A numbing cream may be placed on the skin over the port site.  Your health care provider will put on a mask and sterile gloves.  The skin over your port will be cleaned carefully with a germ-killing soap and allowed to dry.  Your health care provider will gently pinch the port and insert a needle into it.  Your health care provider will check for a blood return to make sure the port is in the vein and is not clogged.  If your port needs to remain accessed to get medicine continuously (constant infusion), your health care provider will place a clear bandage (dressing) over the needle site. The dressing and needle will need to be changed every week, or as told by your health care provider. What is flushing? Flushing helps keep the port from getting clogged. Follow instructions from your health care provider about how and when to flush the port. Ports are usually flushed with saline solution or a medicine called heparin. The need for flushing will depend on how the port is used:  If the port is only used from time to time to give medicines or draw blood, the port may need to be flushed: ? Before and after medicines have been given. ? Before and after blood has been drawn. ? As part of routine maintenance. Flushing may be recommended every 4-6 weeks.  If a constant infusion is running, the port may not need to be flushed.  Throw away any syringes in a disposal container that is meant for sharp items (sharps container). You can buy a sharps container from a pharmacy, or you can make one by using an empty hard plastic bottle with a cover. How long will my port stay implanted? The port can stay in for as long as your health care provider thinks it is needed. When it is time for the port to come out, a surgery will be done to remove it. The surgery will be similar to the procedure that was done  to put the port in. Follow these instructions at home:   Flush your port as told by your health care provider.  If you need an infusion over several days, follow instructions from your health care provider about how to take care of your port site. Make sure you: ? Wash your hands with soap and water before you change your dressing. If soap and water are not available, use alcohol-based hand sanitizer. ? Change your dressing as told by your health care provider. ? Place any used dressings or infusion bags into a plastic bag. Throw that bag in the trash. ? Keep the dressing that covers the needle clean and dry. Do not get it wet. ? Do not use scissors or sharp objects near the tube. ? Keep the tube clamped, unless it is being used.  Check your port site every day for signs of infection. Check for: ? Redness, swelling, or pain. ? Fluid or blood. ? Pus or a bad smell.  Protect the skin around the port site. ? Avoid wearing bra straps that rub or irritate  the site. ? Protect the skin around your port from seat belts. Place a soft pad over your chest if needed.  Bathe or shower as told by your health care provider. The site may get wet as long as you are not actively receiving an infusion.  Return to your normal activities as told by your health care provider. Ask your health care provider what activities are safe for you.  Carry a medical alert card or wear a medical alert bracelet at all times. This will let health care providers know that you have an implanted port in case of an emergency. Get help right away if:  You have redness, swelling, or pain at the port site.  You have fluid or blood coming from your port site.  You have pus or a bad smell coming from the port site.  You have a fever. Summary  Implanted ports are usually placed in the chest for long-term IV access.  Follow instructions from your health care provider about flushing the port and changing bandages  (dressings).  Take care of the area around your port by avoiding clothing that puts pressure on the area, and by watching for signs of infection.  Protect the skin around your port from seat belts. Place a soft pad over your chest if needed.  Get help right away if you have a fever or you have redness, swelling, pain, drainage, or a bad smell at the port site. This information is not intended to replace advice given to you by your health care provider. Make sure you discuss any questions you have with your health care provider. Document Revised: 01/03/2019 Document Reviewed: 10/14/2016 Elsevier Patient Education  2020 [employer].

## 2020-05-24 ENCOUNTER — Encounter (HOSPITAL_COMMUNITY): Payer: Self-pay | Admitting: General Surgery

## 2020-05-24 ENCOUNTER — Inpatient Hospital Stay (HOSPITAL_BASED_OUTPATIENT_CLINIC_OR_DEPARTMENT_OTHER): Payer: Medicare Other | Admitting: Hematology

## 2020-05-24 ENCOUNTER — Other Ambulatory Visit: Payer: Self-pay

## 2020-05-24 VITALS — BP 140/75 | HR 91 | Temp 98.3°F | Resp 16 | Wt 147.7 lb

## 2020-05-24 DIAGNOSIS — C259 Malignant neoplasm of pancreas, unspecified: Secondary | ICD-10-CM | POA: Diagnosis not present

## 2020-05-24 DIAGNOSIS — C787 Secondary malignant neoplasm of liver and intrahepatic bile duct: Secondary | ICD-10-CM | POA: Diagnosis not present

## 2020-05-24 NOTE — Progress Notes (Signed)
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    CANCER STAGING: Cancer Staging No matching staging information was found for the patient.  INTERVAL HISTORY:  [name], a 69 y.o. 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 [name] was last seen on 05/11/2020.   Today he complains of having abdominal pain and pain and itching around the port in his left shoulder. He is taking the metformin BID and denies having diarrhea. He is taking Norco every 4 hours for the pain, which helps only a little. He denies having any numbness or tingling in fingers, hands or feet.  His daughter will be driving him to the hospital. He lives at home with his wife and spends most of the day sitting at home watching TV.   REVIEW OF SYSTEMS:  Review of Systems  Constitutional: Positive for appetite change (mildly decreased) and fatigue (mild).  HENT:   Positive for trouble swallowing (chewing difficulty d/t teeth).   Gastrointestinal: Positive for abdominal pain (8/10 abdominal pain). Negative for diarrhea.  Skin: Positive for itching (around port site).  Neurological: Negative for numbness.  Psychiatric/Behavioral: Positive for sleep disturbance.  All other systems reviewed and are negative.   PAST MEDICAL/SURGICAL HISTORY:  Past Medical History:  Diagnosis Date  . Arthritis   . Diabetes mellitus without complication ([place])   . Emesis   . Family history of cervical cancer   . Heart murmur   . Hyperglycemia   . Hypertension   . Joint pain    Past Surgical History:  Procedure Laterality Date  . AMPUTATION Left  08/02/2018   Procedure: LEFT AMPUTATION BELOW KNEE;  Surgeon: Marty Heck, MD;  Location: [facility];  Service: Vascular;  Laterality: Left;  . ELBOW SURGERY     Bone graft to repair elbow  . LOWER EXTREMITY ANGIOGRAPHY N/A 07/24/2018   Procedure: LOWER EXTREMITY ANGIOGRAPHY;  Surgeon: Marty Heck, MD;  Location: [facility];  Service: Cardiovascular;  Laterality: N/A;  . PERIPHERAL VASCULAR INTERVENTION  07/24/2018   Procedure: PERIPHERAL VASCULAR INTERVENTION;  Surgeon: Marty Heck, MD;  Location: [facility];  Service: Cardiovascular;;  . PORTACATH PLACEMENT Left 05/21/2020   Procedure: INSERTION PORT-A-CATH;  Surgeon: Aviva Signs, MD;  Location: [facility];  Service: General;  Laterality: Left;  . TONSILLECTOMY    . TRANSMETATARSAL AMPUTATION Left 07/25/2018   Procedure: LEFT GREAT TOE AMPUTATION;  Surgeon: Marty Heck, MD;  Location: [facility];  Service: Vascular;  Laterality: Left;  . TRANSMETATARSAL AMPUTATION Left 07/30/2018   Procedure: TRANSMETATARSAL AMPUTATION;  Surgeon: Waynetta Sandy, MD;  Location: [facility];  Service: Vascular;  Laterality: Left;    SOCIAL HISTORY:  Social History   Socioeconomic History  . Marital status: Married    Spouse name: Not on file  . Number of children: Not on file  . Years of education: Not on file  . Highest education level: Not on file  Occupational History  . Not on file  Tobacco Use  . Smoking status: Current Every Day Smoker    Packs/day: 0.50    Types: Cigarettes  . Smokeless  tobacco: Former Systems developer    Types: Snuff    Quit date: 04/21/1989  Vaping Use  . Vaping Use: Never used  Substance and Sexual Activity  . Alcohol use: Yes    Alcohol/week: 12.0 standard drinks    Types: 12 Cans of beer per week  . Drug use: No  . Sexual activity: Not Currently  Other Topics Concern  . Not on file  Social History Narrative  . Not on file   Social Determinants of Health   Financial Resource  Strain:   . Difficulty of Paying Living Expenses: Not on file  Food Insecurity:   . Worried About Charity fundraiser in the Last Year: Not on file  . Ran Out of Food in the Last Year: Not on file  Transportation Needs:   . Lack of Transportation (Medical): Not on file  . Lack of Transportation (Non-Medical): Not on file  Physical Activity:   . Days of Exercise per Week: Not on file  . Minutes of Exercise per Session: Not on file  Stress:   . Feeling of Stress : Not on file  Social Connections:   . Frequency of Communication with Friends and Family: Not on file  . Frequency of Social Gatherings with Friends and Family: Not on file  . Attends Religious Services: Not on file  . Active Member of Clubs or Organizations: Not on file  . Attends Archivist Meetings: Not on file  . Marital Status: Not on file  Intimate Partner Violence:   . Fear of Current or Ex-Partner: Not on file  . Emotionally Abused: Not on file  . Physically Abused: Not on file  . Sexually Abused: Not on file    FAMILY HISTORY:  Family History  Problem Relation Age of Onset  . Alcoholism Mother   . Alcoholism Father   . Cervical cancer Sister        dx. late 54s  . Cervical cancer Sister        dx. late 47s    CURRENT MEDICATIONS:  Current Outpatient Medications  Medication Sig Dispense Refill  . aspirin EC 81 MG EC tablet Take 1 tablet (81 mg total) by mouth daily. 30 tablet 0  . blood glucose meter kit and supplies KIT Dispense based on patient and insurance preference. Use up to four times daily as directed. (FOR ICD-9 250.00, 250.01). 1 each 0  . HYDROcodone-acetaminophen (NORCO) 5-325 MG tablet Take 1 tablet by mouth every 4 (four) hours as needed for moderate pain. 30 tablet 0  . metFORMIN (GLUCOPHAGE) 500 MG tablet Take 1 tablet (500 mg total) by mouth 2 (two) times daily with a meal. 60 tablet 11   No current facility-administered medications for this visit.    ALLERGIES:  Allergies    Allergen Reactions  . Tea Itching and Rash    PHYSICAL EXAM:  Performance status (ECOG): 1 - Symptomatic but completely ambulatory  Vitals:   05/24/20 1406  BP: 140/75  Pulse: 91  Resp: 16  Temp: 98.3 F (36.8 C)  SpO2: 100%   Wt Readings from Last 3 Encounters:  05/24/20 147 lb 11.2 oz (67 kg)  05/13/20 148 lb (67.1 kg)  05/11/20 149 lb 14.4 oz (68 kg)   Physical Exam Vitals reviewed.  Constitutional:      Appearance: Normal appearance.  Chest:     Comments: Port-a-Cath in L chest Neurological:     General: No focal deficit present.     Mental Status: He  is alert and oriented to person, place, and time.  Psychiatric:        Mood and Affect: Mood normal.        Behavior: Behavior normal.      LABORATORY DATA:  I have reviewed the labs as listed.  CBC Latest Ref Rng & Units 05/03/2020 04/25/2020 04/24/2020  WBC 4.0 - 10.5 K/uL 9.2 10.2 9.3  Hemoglobin 13.0 - 17.0 g/dL 13.3 13.3 13.8  Hematocrit 39 - 52 % 41.0 41.0 43.2  Platelets 150 - 400 K/uL 291 217 257   CMP Latest Ref Rng & Units 04/25/2020 04/24/2020 08/06/2018  Glucose 70 - 99 mg/dL 258(H) 523(HH) 195(H)  BUN 8 - 23 mg/dL 8 10 6(L)  Creatinine 0.61 - 1.24 mg/dL 0.65 0.85 0.62  Sodium 135 - 145 mmol/L 137 133(L) 133(L)  Potassium 3.5 - 5.1 mmol/L 3.9 3.8 3.3(L)  Chloride 98 - 111 mmol/L 106 101 103  CO2 22 - 32 mmol/L 22 20(L) 24  Calcium 8.9 - 10.3 mg/dL 8.4(L) 8.7(L) 8.1(L)  Total Protein 6.5 - 8.1 g/dL - 7.0 6.3(L)  Total Bilirubin 0.3 - 1.2 mg/dL - 0.8 0.7  Alkaline Phos 38 - 126 U/L - 122 61  AST 15 - 41 U/L - 26 42(H)  ALT 0 - 44 U/L - 18 33    DIAGNOSTIC IMAGING:  I have independently reviewed the scans and discussed with the patient. CT ABDOMEN PELVIS W CONTRAST  Result Date: 04/24/2020 CLINICAL DATA:  LEFT upper quadrant abdominal pain. EXAM: CT ABDOMEN AND PELVIS WITH CONTRAST TECHNIQUE: Multidetector CT imaging of the abdomen and pelvis was performed using the standard protocol following bolus  administration of intravenous contrast. CONTRAST:  12m OMNIPAQUE IOHEXOL 300 MG/ML  SOLN COMPARISON:  None. FINDINGS: Lower chest: 5 mm pleural based nodular density within the RIGHT lower lobe. Emphysematous blebs at the RIGHT lung base. Hepatobiliary: Scattered small hypodense foci throughout the bilateral liver lobes, largest within the posterior RIGHT liver lobe measuring 1.1 cm majority too small to definitively characterize largest suspicious for neoplastic/metastatic process. Gallstones. Questionable gallbladder wall thickening. No pericholecystic fluid. No bile duct dilatation seen Pancreas: Hypodense mass within the head/uncinate process of the pancreas, measuring 2.2 cm, highly suspicious for pancreatic neoplasm. No pancreatic duct dilatation seen. Spleen: Normal in size without focal abnormality. Adrenals/Urinary Tract: Adrenal glands appear normal. 1.4 cm mass within the lateral cortex of the RIGHT kidney, with probable enhancement centrally, suspicious for additional neoplastic mass, alternatively angiomyolipoma (axial series 2, image 31; coronal series 5, image 59). LEFT kidney appears. No hydronephrosis. No ureteral or bladder calculi identified. Bladder appears normal. Stomach/Bowel: No dilated large or small bowel loops. No evidence of bowel wall inflammation. Appendix is normal. Stomach is unremarkable, partially decompressed. Vascular/Lymphatic: Aortic atherosclerosis. No acute appearing vascular abnormality. Mildly prominent lymph nodes within the upper abdomen, just above the pancreatic head with short axis measurement of 11 mm and portacaval space with short axis measurement of 12 mm. No enlarged lymph nodes seen elsewhere within the abdomen or pelvis. Reproductive: Prostate is unremarkable. Other: No free fluid or free intraperitoneal air. Musculoskeletal: Degenerative spondylosis of the thoracolumbar spine, mild to moderate in degree. No acute or suspicious osseous findings identified.  IMPRESSION: 1. Hypodense mass within the pancreatic head/uncinate process, measuring 2.2 cm, highly suspicious for pancreatic neoplasm. Recommend further characterization with pancreatic protocol MRI. 2. Scattered small hypodense foci throughout the bilateral liver lobes, largest within the posterior RIGHT liver lobe measuring 1.1 cm, majority too small to definitively characterize  but largest suspicious for neoplastic/metastatic process. 3. 1.4 cm mass within the lateral cortex of the RIGHT kidney, with probable enhancement centrally, suspicious for additional neoplastic mass, alternatively benign angiomyolipoma. Recommend further characterization with nonemergent renal MRI. 4. Mildly prominent lymph nodes within the upper abdomen, just above the pancreatic head and portacaval space, suspicious for metastatic lymphadenopathy. 5. 5 mm pleural based nodular density within the RIGHT lower lobe, nonspecific but suspicious given the presumed pancreatic neoplasm, liver metastases and possible concomitant renal neoplasm. 6. Cholelithiasis. Questionable gallbladder wall thickening but no pericholecystic fluid to suggest acute cholecystitis. If any localizable RIGHT upper quadrant pain and/or clinical suspicion for acute cholecystitis, consider RIGHT upper quadrant ultrasound for further characterization. Aortic Atherosclerosis (ICD10-I70.0) and Emphysema (ICD10-J43.9). Electronically Signed   By: Franki Cabot M.D.   On: 04/24/2020 15:11   NM PET Image Initial (PI) Skull Base To Thigh  Result Date: 05/19/2020 CLINICAL DATA:  Initial treatment strategy for pancreas adenocarcinoma. EXAM: NUCLEAR MEDICINE PET SKULL BASE TO THIGH TECHNIQUE: 6.9 mCi F-18 FDG was injected intravenously. Full-ring PET imaging was performed from the skull base to thigh after the radiotracer. CT data was obtained and used for attenuation correction and anatomic localization. Fasting blood glucose: 238 mg/dl COMPARISON:  CT AP 04/24/2020.  FINDINGS: Mediastinal blood pool activity: SUV max 2.92 Liver activity: SUV max NA NECK: No hypermetabolic lymph nodes in the neck. Incidental CT findings: none CHEST: No FDG avid axillary, supraclavicular, mediastinal, or hilar lymph nodes. Tiny perifissural nodule within the anterolateral right base measures 3 mm and is too small to reliably characterize. Tiny 4 mm subpleural nodule in the posterolateral right lower lobe is far too small to reliably characterize. No FDG avid pulmonary nodules identified bilaterally. Incidental CT findings: Moderate paraseptal and centrilobular emphysema. Aortic atherosclerosis. Coronary artery calcifications. ABDOMEN/PELVIS: Numerous low-attenuation lesions compatible with biopsy-proven adenocarcinoma liver metastasis are again noted. Most of these are too small to reliably characterize by PET-CT. Several foci of increased FDG uptake above background liver activity are noted within the left hepatic lobe with SUV max is between 4.69 and 4.84. Likely representing metastasis which are occult on the corresponding CT images. Mass within the head of pancreas is FDG avid within SUV max of 5.5. No abnormal FDG uptake within the spleen, adrenal glands. No significant FDG uptake identified within the abdominopelvic lymph nodes. Recently described enhancing lesion within the right kidney is suboptimally evaluated on today's exam. Incidental CT findings: Gallstones. Aortic atherosclerosis. No aneurysm. SKELETON: No focal hypermetabolic activity to suggest skeletal metastasis. Incidental CT findings: Thoracolumbar degenerative disc disease noted. IMPRESSION: 1. FDG avid head of pancreas mass compatible with primary pancreatic adenocarcinoma. 2. Multifocal low-attenuation lesions are scattered throughout both lobes of liver compatible with biopsy-proven liver metastases. Most of these are too small to reliably characterize by PET-CT. On the PET images there are several foci of increased FDG  uptake above background activity most likely representing foci of metastasis. 3. No FDG avid lymph nodes within the abdomen. No evidence for FDG avid metastasis to the chest or skeletal structures. 4. Two small lung nodules are identified within the right lower lobe measuring less than 5 mm. These are far too small to reliably characterize by PET-CT. 5. Aortic Atherosclerosis (ICD10-I70.0) and Emphysema (ICD10-J43.9). 6. Coronary artery calcifications. Electronically Signed   By: Kerby Moors M.D.   On: 05/19/2020 11:17   DG Chest Port 1 View  Result Date: 05/21/2020 CLINICAL DATA:  LEFT Port-A-Cath placement, history of metastatic pancreatic carcinoma EXAM: PORTABLE  CHEST 1 VIEW COMPARISON:  08/04/2018 FINDINGS: LEFT-sided Port-A-Cath placed since the previous study terminates at the distal aspect of the superior vena cava entering via subclavian approach. Cardiomediastinal contours and hilar structures are normal. No pneumothorax. No lobar consolidation.  No pleural effusion. On limited assessment visualized skeletal structures without acute process. IMPRESSION: LEFT-sided Port-A-Cath placed since the previous study. No pneumothorax. Electronically Signed   By: Zetta Bills M.D.   On: 05/21/2020 08:12   DG C-Arm 1-60 Min-No Report  Result Date: 05/21/2020 Fluoroscopy was utilized by the requesting physician.  No radiographic interpretation.   [place] CORE BIOPSY (LIVER)  Result Date: 05/03/2020 INDICATION: Pancreatic mass and multiple small liver lesions suspicious for metastatic disease. EXAM: ULTRASOUND GUIDED CORE BIOPSY OF LIVER MEDICATIONS: None. ANESTHESIA/SEDATION: Fentanyl 50 mcg IV; Versed 1.0 mg IV Moderate Sedation Time:  28 minutes. The patient was continuously monitored during the procedure by the interventional radiology nurse under my direct supervision. PROCEDURE: The procedure, risks, benefits, and alternatives were explained to the patient. Questions regarding the procedure were  encouraged and answered. The patient understands and consents to the procedure. A time-out was performed prior to initiating the procedure. The right abdominal wall was prepped with chlorhexidine in a sterile fashion, and a sterile drape was applied covering the operative field. A sterile gown and sterile gloves were used for the procedure. Local anesthesia was provided with 1% Lidocaine. Ultrasound was used to localize liver lesions. Under direct ultrasound guidance, a 17 gauge needle was advanced into the right lobe of the liver. Four separate coaxial 18 gauge core biopsy samples were obtained. Gel-Foam pledgets were advanced through the outer needle as the outer needle was removed. COMPLICATIONS: None immediate. FINDINGS: Multiple small, rounded hypoechoic liver lesions are seen scattered throughout the liver. These are all on the order of 1 cm in diameter. A peripheral, inferior right lobe lesion measuring approximately 1 cm was chosen for sampling. Solid tissue was obtained. IMPRESSION: Ultrasound-guided core biopsy performed of a roughly 1 cm lesion in the right lobe of the liver. Electronically Signed   By: Aletta Edouard M.D.   On: 05/03/2020 16:38     ASSESSMENT:  1. 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 -Current active smoker, 1 pack/day for 47 years. No history of alcohol use. No family history of malignancy. -CT AP with contrast on 04/24/2020 shows 2.2 cm pancreatic head mass, small hypodense lesions throughout the bilateral lower lobes, predominantly in the right lower lobe, largest measuring 1.1 cm. 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 Mildly prominent lymph nodes within the upper abdomen above the pancreas head suspicious for metastatic lymphadenopathy. 5 mm pleural-based right lower lobe nodule. -CA 19-9 is 12. -Liver  biopsy on 05/03/2020 consistent with metastatic adenocarcinoma, positive for CK7, CK20, CDX2.  Differential diagnosis includes primary upper GI versus pancreaticobiliary. -PET scan on 05/19/2020 shows FDG avid had a pancreatic mass, multifocal low-attenuation lesions scattered throughout both lobes of the liver compatible with biopsy-proven liver metastasis.  No FDG avid lymph nodes in the abdomen.  No evidence of metastasis in the chest or bones.  2 small lung nodules within the right lower lobe measuring less than 5 mm.  2. Poorly controlled diabetes: -Hemoglobin A1c was 10.8 during hospitalization.  3. Abdominal pain: -Highly likely malignancy related. -He is currently on hydrocodone 5/325 1 to 2 tablets every 4 hours as  needed. Patient reports that he still has pain but manageable.  4. Right kidney mass: -No further work-up needed if biopsy of liver lesion confirms pancreatic adenocarcinoma.   PLAN:  1.  Metastatic pancreatic cancer to the liver: -I have reviewed results of the PET scan.  CA 19-9 was normal. -Clinically is consistent with metastatic pancreatic cancer. -I have recommended palliative chemotherapy with FOLFIRINOX every 2 weeks. -We discussed about the chemotherapy regimen and side effects. -He had port placed by Dr. Arnoldo Morale. -Tentatively will start him on next week.  He does not have any neuropathy in the right leg or both hands.  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 -We will also send for foundation 1 testing.  2. Poorly controlled diabetes: -Continue Metformin 500 mg twice daily.  Check blood sugars at home and bring it back to [place].  3. Abdominal pain: -Continue hydrocodone 5/325 every 4 hours as needed which is helping.  4. Right kidney mass: -Right kidney lesion is suboptimally evaluated on PET scan.  5.  Genetic testing: -Proceed with genetic testing.   Orders placed this encounter:  No orders of the defined types were placed in  this encounter.    Derek Jack, MD [place] [phone]   I, Milinda Antis, am acting as a scribe for Dr. Sanda Linger.  I, Derek Jack MD, have reviewed the above documentation for accuracy and completeness, and I agree with the above.

## 2020-05-24 NOTE — Patient Instructions (Addendum)
[place] at [facility] Discharge Instructions  You were seen today by Dr. Delton Coombes. He went over your recent results. You will be beginning chemotherapy treatment with irinotecan, fluorouracil and oxaliplatin; make sure you stay away from anything cold for the first few days of your chemotherapy. Your treatments will occur every 2 weeks and scans to check your progress will be done every 3 months. Dr. Delton Coombes will see you back in 1 week for labs and follow up.   Thank you for choosing [place] at [facility] to provide your oncology and hematology care.  To afford each patient quality time with our provider, please arrive at least 15 minutes before your scheduled appointment time.   If you have a lab appointment with the [facility] please come in thru the Main Entrance and check in at the main information desk  You need to re-schedule your appointment should you arrive 10 or more minutes late.  We strive to give you quality time with our providers, and arriving late affects you and other patients whose appointments are after yours.  Also, if you no show three or more times for appointments you may be dismissed from the clinic at the providers discretion.     Again, thank you for choosing [facility].  Our hope is that these requests will decrease the amount of time that you wait before being seen by our physicians.       _____________________________________________________________  Should you have questions after your visit to [facility], please contact our office at [phone] between the hours of 8:00 a.m. and 4:30 p.m.  Voicemails left after 4:00 p.m. will not be returned until the following business day.  For prescription refill requests, have your pharmacy contact our office and allow 72 hours.    Cancer Center Support Programs:   > Cancer Support Group  2nd Tuesday of the month 1pm-2pm, Journey Room

## 2020-05-24 NOTE — Progress Notes (Signed)
START ON PATHWAY REGIMEN - Pancreatic Adenocarcinoma     A cycle is every 14 days:     Oxaliplatin      Leucovorin      Irinotecan      Fluorouracil   **Always confirm dose/schedule in your pharmacy ordering system**  Patient Characteristics: Metastatic Disease, First Line, PS = 0,1, BRCA1/2 and PALB2  Mutation Absent/Unknown Therapeutic Status: Metastatic Disease Line of Therapy: First Line ECOG Performance Status: 1 BRCA1/2 Mutation Status: Awaiting Test Results PALB2 Mutation Status: Awaiting Test Results Intent of Therapy: Non-Curative / Palliative Intent, Discussed with Patient 

## 2020-05-25 ENCOUNTER — Ambulatory Visit (INDEPENDENT_AMBULATORY_CARE_PROVIDER_SITE_OTHER): Payer: Medicare Other | Admitting: Family Medicine

## 2020-05-25 ENCOUNTER — Telehealth (HOSPITAL_COMMUNITY): Payer: Self-pay | Admitting: Hematology

## 2020-05-25 ENCOUNTER — Encounter: Payer: Self-pay | Admitting: Family Medicine

## 2020-05-25 ENCOUNTER — Other Ambulatory Visit (HOSPITAL_COMMUNITY): Payer: Self-pay

## 2020-05-25 ENCOUNTER — Inpatient Hospital Stay (HOSPITAL_COMMUNITY): Payer: Medicare Other | Admitting: General Practice

## 2020-05-25 ENCOUNTER — Encounter (HOSPITAL_COMMUNITY): Payer: Self-pay

## 2020-05-25 VITALS — BP 114/72 | HR 91 | Temp 97.8°F | Resp 18 | Ht 71.0 in | Wt 145.1 lb

## 2020-05-25 DIAGNOSIS — Z95828 Presence of other vascular implants and grafts: Secondary | ICD-10-CM

## 2020-05-25 DIAGNOSIS — I10 Essential (primary) hypertension: Secondary | ICD-10-CM

## 2020-05-25 DIAGNOSIS — C259 Malignant neoplasm of pancreas, unspecified: Secondary | ICD-10-CM

## 2020-05-25 DIAGNOSIS — C787 Secondary malignant neoplasm of liver and intrahepatic bile duct: Secondary | ICD-10-CM

## 2020-05-25 DIAGNOSIS — E785 Hyperlipidemia, unspecified: Secondary | ICD-10-CM

## 2020-05-25 DIAGNOSIS — Z89512 Acquired absence of left leg below knee: Secondary | ICD-10-CM

## 2020-05-25 DIAGNOSIS — E1142 Type 2 diabetes mellitus with diabetic polyneuropathy: Secondary | ICD-10-CM

## 2020-05-25 DIAGNOSIS — Z720 Tobacco use: Secondary | ICD-10-CM

## 2020-05-25 HISTORY — DX: Presence of other vascular implants and grafts: Z95.828

## 2020-05-25 MED ORDER — METFORMIN HCL 500 MG PO TABS: 1000.0000 mg | ORAL_TABLET | Freq: Two times a day (BID) | ORAL | 3 refills | Status: DC

## 2020-05-25 MED ORDER — LOPERAMIDE HCL 2 MG PO TABS: ORAL_TABLET | ORAL | 1 refills | Status: DC

## 2020-05-25 MED ORDER — PROCHLORPERAZINE MALEATE 10 MG PO TABS: 10.0000 mg | ORAL_TABLET | Freq: Four times a day (QID) | ORAL | 1 refills | Status: DC | PRN

## 2020-05-25 MED ORDER — LIDOCAINE-PRILOCAINE 2.5-2.5 % EX CREA: TOPICAL_CREAM | CUTANEOUS | 3 refills | Status: DC

## 2020-05-25 NOTE — Assessment & Plan Note (Signed)
John Islam Sr. is encouraged to maintain a well balanced diet that is low in salt. Controlled, he is not currently taking any medication at this time.  It is encouraged for him to practice smoking cessation and to get 30 to 60 minutes of exercise as he can tolerate.

## 2020-05-25 NOTE — Assessment & Plan Note (Signed)
ALT has been elevated in the past.  Is not currently on any statin medication.  We will revisit this at his next appointment.  Encouraged heart healthy low-fat diet.

## 2020-05-25 NOTE — Assessment & Plan Note (Signed)
Asked about quitting: confirms they are currently smokes cigarettes Advise to quit smoking: Educated about QUITTING to reduce the risk of cancer, cardio and cerebrovascular disease. Assess willingness: Unwilling to quit at this time, but is working on cutting back. Assist with counseling and pharmacotherapy: Counseled for 5 minutes and literature provided. Arrange for follow up:  not quitting follow up in 3 months and continue to offer help.   

## 2020-05-25 NOTE — Assessment & Plan Note (Signed)
Has a below new the knee left-sided amputation.  Wears prosthesis.  This is secondary to having dry gangrene after dropping a wood stove on his foot.  Overall he reports that he is doing well with this and has no issues.

## 2020-05-25 NOTE — Assessment & Plan Note (Addendum)
John Islam Sr. is encouraged to check blood sugar daily as directed. Current medication dose is increased to 1000 mg twice daily with Metformin. 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  Pending enzymes we would start low-dose medication. Educated on importance of maintain a well balanced diabetic friendly diet.  She is reminded the importance of maintaining  good blood sugars,  taking medications as directed, daily foot care, annual eye exams. Additionally educated about keeping good control over blood pressure and cholesterol as well.

## 2020-05-25 NOTE — Assessment & Plan Note (Signed)
Is receiving cancer treatment over at [facility].

## 2020-05-25 NOTE — Patient Instructions (Addendum)
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  You will be treated in the clinic every 2 weeks with a combination of chemotherapy drugs - oxaliplatin, irinotecan, leucovorin (not a chemo drug), and fluorouracil (5FU).  The intent of treatment is to control this cancer, prevent it from spreading any further, and to help alleviate any symptoms you may be having related to your disease.  You will see the doctor regularly throughout treatment.  We will obtain blood work from you prior to every treatment and monitor your results to make sure it is safe to give your treatment. The doctor monitors your response to treatment by the way you are feeling, your blood work, and by obtaining scans periodically.  There will be wait times while you are here for treatment.  It will take about 30 minutes to 1 hour for your lab work to result.  Then there will be wait times while pharmacy mixes your medications.    Medications you will receive in the clinic prior to your chemotherapy medications:  Aloxi:  ALOXI is used in adults to help prevent the nausea and vomiting that happens with certain chemotherapy drugs.  Aloxi is a long acting medication, and will remain in your system for about 2 days.   Emend:  This is an anti-nausea medication that is used with Aloxi to help prevent nausea and vomiting caused by chemotherapy.  Dexamethasone:  This is a steroid given prior to chemotherapy to help prevent allergic reactions; it may also help prevent and control nausea and diarrhea.    Oxaliplatin (Eloxatin)  About This Drug  Oxaliplatin is used to treat cancer. It is given in the vein (IV).  It takes two hours to infuse.  Possible Side Effects  . Bone marrow suppression. This is a decrease in the number of white blood cells, red blood cells, and platelets. This may raise your risk of infection, make you tired and weak (fatigue),  and raise your risk of bleeding.  . Tiredness  . Soreness of the mouth and throat. You may have red areas, white patches, or sores that hurt.  . Nausea and vomiting (throwing up)  . Diarrhea (loose bowel movements)  . Changes in your liver function  . Effects on the nerves called peripheral neuropathy. You may feel numbness, tingling, or pain in your hands and feet, and may be worse in cold temperatures. It may be hard for you to button your clothes, open jars, or walk as usual. The effect on the nerves may get worse with more doses of the drug. These effects get better in some people after the drug is stopped but it does not get better in all people  Note: Each of the side effects above was reported in 40% or greater of patients treated with oxaliplatin. Not all possible side effects are included above.  Warnings and Precautions  . Allergic reactions, including anaphylaxis, which may be life-threatening are rare but may happen in some patients. Signs of allergic reaction to this drug may be swelling of the face, feeling like your tongue or throat are swelling, trouble breathing, rash, itching, fever, chills, feeling dizzy, and/or feeling that your heart is beating in a fast or not normal way. If this happens, do not take another dose of this drug. You should get urgent medical treatment.  . Inflammation (swelling) of the lungs, which may be life-threatening. You may have a dry cough or trouble  breathing.  . Effects on the nerves (neuropathy) may resolve within 14 days, or it may persist beyond 14 days.  . Severe decrease in white blood cells when combined with the chemotherapy agents 5-fluorouracil and leucovorin. This may be life-threatening.  . Severe changes in your liver function  . Abnormal heart beat and/or EKG, which can be life-threatening  . Rhabdomyolysis- damage to your muscles which may release proteins in your blood and affect how your kidneys work, which can be  life-threatening. You may have severe muscle weakness and/or pain, or dark urine.  Important Information  . This drug may impair your ability to drive or use machinery. Talk to your doctor and/or nurse about precautions you may need to take.  . This drug may be present in the saliva, tears, sweat, urine, stool, vomit, semen, and vaginal secretions. Talk to your doctor and/or your nurse about the necessary precautions to take during this time.  . The effects on the nerves can be aggravated by exposure to cold. Avoid cold beverages, use of ice and make sure you cover your skin and dress warmly prior to being exposed to cold temperatures while you are receiving treatment with oxaliplatin.  Treating Side Effects  . Manage tiredness by pacing your activities for the day.  . Be sure to include periods of rest between energy-draining activities.  . To decrease the risk of infection, wash your hands regularly.  . Avoid close contact with people who have a cold, the flu, or other infections.  . Take your temperature as your doctor or nurse tells you, and whenever you feel like you may have a fever.  . To help decrease the risk of bleeding, use a soft toothbrush. Check with your nurse before using dental floss.  . Be very careful when using knives or tools.  . Use an electric shaver instead of a razor.  . Drink plenty of fluids (a minimum of eight glasses per day is recommended).  . Mouth care is very important. Your mouth care should consist of routine, gentle cleaning of your teeth or dentures and rinsing your mouth with a mixture of 1/2 teaspoon of salt in 8 ounces of water or 1/2 teaspoon of baking soda in 8 ounces of water. This should be done at least after each meal and at bedtime.  . If you have mouth sores, avoid mouthwash that has alcohol. Also avoid alcohol and smoking because they can bother your mouth and throat.  . To help with nausea and vomiting, eat small, frequent meals  instead of three large meals a day. Choose foods and drinks that are at room temperature. Ask your nurse or doctor about other helpful tips and medicine that is available to help stop or lessen these symptoms.  . If you throw up or have loose bowel movements, you should drink more fluids so that you do not become dehydrated (lack of water in the body from losing too much fluid).  . If you have diarrhea, eat low-fiber foods that are high in protein and calories and avoid foods that can irritate your digestive tracts or lead to cramping.  . Ask your nurse or doctor about medicine that can lessen or stop your diarrhea.  . If you have numbness and tingling in your hands and feet, be careful when cooking, walking, and handling sharp objects and hot liquids.  . Do not drink cold drinks or use ice in beverages. Drink fluids at room temperature or warmer, and drink through a  straw.  . Wear gloves to touch cold objects, and wear warm clothing and cover you skin during cold weather.  Food and Drug Interactions  . There are no known interactions of oxaliplatin with food and other medications.  . This drug may interact with other medicines. Tell your doctor and pharmacist about all the prescription and over-the-counter medicines and dietary supplements (vitamins, minerals, herbs and others) that you are taking at this time. Also, check with your doctor or pharmacist before starting any new prescription or over-the-counter medicines, or dietary supplements to make sure that there are no interactions  When to Call the Doctor  Call your doctor or nurse if you have any of these symptoms and/or any new or unusual symptoms:  . Fever of 100.4 F (38 C) or higher  . Chills  . Tiredness that interferes with your daily activities  . Feeling dizzy or lightheaded  . Easy bleeding or bruising  . Feeling that your heart is beating in a fast or not normal way (palpitations)  . Pain in your chest  . Dry  cough  . Trouble breathing  . Pain in your mouth or throat that makes it hard to eat or drink  . Nausea that stops you from eating or drinking and/or is not relieved by prescribed medicines  . Throwing up  . Diarrhea, 4 times in one day or diarrhea with lack of strength or a feeling of being dizzy  . Numbness, tingling, or pain in your hands and feet  . Signs of possible liver problems: dark urine, pale bowel movements, bad stomach pain, feeling very tired and weak, unusual itching, or yellowing of the eyes or skin  . Signs of rhabdomyolysis: decreased urine, very dark urine, muscle pain in the shoulders, thighs, or lower back; muscle weakness or trouble moving arms and legs  . Signs of allergic reaction: swelling of the face, feeling like your tongue or throat are swelling, trouble breathing, rash, itching, fever, chills, feeling dizzy, and/or feeling that your heart is beating in a fast or not normal way. If this happens, call 911 for emergency care.  . If you think you may be pregnant  Reproduction Warnings  . Pregnancy warning: This drug may have harmful effects on the unborn baby. Women of childbearing potential should use effective methods of birth control during your cancer treatment. Let your doctor know right away if you think you may be pregnant or may have impregnated your partner.  . Breastfeeding warning: It is not known if this drug passes into breast milk. For this reason, women should talk to their doctor about the risks and benefits of breastfeeding during treatment with this drug because this drug may enter the breast milk and cause harm to a breastfeeding baby.  . Fertility warning: Human fertility studies have not been done with this drug. Talk with your doctor or nurse if you plan to have children. Ask for information on sperm or egg banking.   Leucovorin Calcium  About This Drug  Leucovorin is a vitamin. It is used in combination with other cancer fighting  drugs such as 5-fluorouracil and methotrexate. Leucovorin is given in the vein (IV).  It is given to enhance the anti-cancer effects of fluorouracil (5FU). It will infuse along with the oxaliplatin over 2 hours.   Possible Side Effects . Rash and itching  Note: Leucovorin by itself has very few side effects. Other side effects you may have can be caused by the other drugs you are  taking, such as 5-fluorouracil.  Warnings and Precautions  . Allergic reactions, including anaphylaxis are rare but may happen in some patients. Signs of allergic reaction to this drug may be swelling of the face, feeling like your tongue or throat are swelling, trouble breathing, rash, itching, fever, chills, feeling dizzy, and/or feeling that your heart is beating in a fast or not normal way. If this happens, do not take another dose of this drug. You should get urgent medical treatment.  Food and Drug Interactions  . There are no known interactions of leucovorin with food.  . This drug may interact with other medicines. Tell your doctor and pharmacist about all the prescription and over-the-counter medicines and dietary supplements (vitamins, minerals, herbs and others) that you are taking at this time.  . Also, check with your doctor or pharmacist before starting any new prescription or over-the-counter medicines, or dietary supplements to make sure that there are no interactions.  When to Call the Doctor  Call your doctor or nurse if you have any of these symptoms and/or any new or unusual symptoms:  . A new rash or a rash that is not relieved by prescribed medicines  . Signs of allergic reaction: swelling of the face, feeling like your tongue or throat are swelling, trouble breathing, rash, itching, fever, chills, feeling dizzy, and/or feeling that your heart is beating in a fast or not normal way. If this happens, call 911 for emergency care.  . If you think you may be pregnant  Reproduction Warnings  .  Pregnancy warning: It is not known if this drug may harm an unborn child. For this reason, be sure to talk with your doctor if you are pregnant or planning to become pregnant while receiving this drug. Let your doctor know right away if you think you may be pregnant  . Breastfeeding warning: It is not known if this drug passes into breast milk. For this reason, women should talk to their doctor about the risks and benefits of breastfeeding during treatment with this drug because this drug may enter the breast milk and cause harm to a breastfeeding baby.  . Fertility warning: Human fertility studies have not been done with this drug. Talk with your doctor or nurse if you plan to have children. Ask for information on sperm or egg banking.    Irinotecan Hydrochloride (Generic Name) Other Names: Camptosar, camptothecin-11, CPT-11   About this drug Irinotecan hydrochloride is used to treat cancer. This drug is given in the vein (IV). It takes 1.5 hours to infuse)   Possible side effects (more common) . Loose bowel movements (diarrhea) that may last for a few days  . Bone marrow depression. This is a decrease in the number of white blood cells, red blood cells, and platelets. This may raise your risk of infection, make you tired and weak (fatigue), and raise your risk of bleeding.  . Nausea and throwing up (vomiting). These symptoms may happen within a few hours or many hours after your treatment and may last up to 24 hours. Medicines are available to stop or lessen these side effects.  . Hair loss: Most often hair loss is temporary; your hair should grow back when treatment is done.   Possible side effects (less common) . Skin and tissue irritation. You may have redness, pain, warmth, or swelling at the IV site.  . Weakness  . Trouble Breathing  . Decreased Appetite (decreased hunger)   Treating side effects . Drink 6-8 cups  of fluids each day unless your doctor has told you to limit your  fluid intake due to some other health problem. A cup is 8 ounces of fluid. If you throw up or have loose bowel movements, you should drink more fluids so that you do not become dehydrated (lack water in the body from losing too much fluid).  . Ask your doctor or nurse about medicine that is available to help stop or lessen the loose bowel movements.  . Talk with your nurse about getting a wig before you lose your hair. Also, call the [employer] at 800-ACS-2345 to find out information about the "Look Good, Feel Better" program close to where you live. It is a free program where women getting chemotherapy can learn about wigs, turbans and scarves as well as makeup techniques and skin and nail care.  . Use effective methods of birth control during your cancer treatment. Talk with your doctor or nurse about options for birth control.  . Vaginal lubricants can be used to lessen vaginal dryness, itching, and pain during sexual relations.   Food and drug interactions There are no known interactions of irinotecan hydrochloride with food. This drug may interact with other medicines. Tell your doctor and pharmacist about all the medicines and dietary supplements (vitamins, minerals, herbs and others) that you are taking at this time. The safety and use of dietary supplements and alternative diets are often not known. Using these might affect your cancer or interfere with your treatment. Until more is known, you should not use dietary supplements or alternative diets without your cancer doctor's help.   When to call the doctor Call your doctor or nurse right away if you have any of these symptoms: . Loose bowel movements (diarrhea) more than 5 or 6 times a day or loose bowel movements with weakness or feeling lightheaded  . Temperature of 100.4 F (38 C) or above  . Chills  . Trouble breathing or feeling short of breath  . Easy bruising or bleeding  . Nausea that stops you from eating or  drinking  . Throwing up  . Redness, pain, warmth, or swelling at the IV site  . Feeling dizzy, lightheaded, or if you pass out   Call your doctor or nurse as soon as possible if you have any of these symptoms:  . Nausea that is not relieved by prescribed medicines  . Extreme tiredness that interferes with normal activities   Sexual problems and reproduction concerns . Infertility warning: Sexual problems and reproduction concerns may happen. In both men and women, this drug may affect your ability to have children. This cannot be determined before your treatment. Talk with your doctor or nurse if you plan to have children. Ask for information on sperm or egg banking.  . In men, this drug may interfere with your ability to make sperm, but it should not change your ability to have sexual relations.  . In women, menstrual bleeding may become irregular or stop while you are getting this drug. Do not assume that you cannot become pregnant if you do not have a menstrual period. . Women may go through signs of menopause (change of life) like vaginal dryness or itching. Vaginal lubricants can be used to lessen vaginal dryness, itching, and pain during sexual relations.  . Genetic counseling is available for you to talk about the effects of this drug therapy on future pregnancies. Also, a genetic counselor can look at the possible risk of problems in  the unborn baby due to this medicine if an exposure happens during pregnancy.  . Pregnancy warning: This drug may have harmful effects on the unborn child, so effective methods of birth control should be used during your cancer treatment.  . Breast feeding warning: Women should not breast feed during treatment because this drug could enter the breast milk and badly harm a breast feeding baby.     5-Fluorouracil (Adrucil; 5FU)  About This Drug  Fluorouracil is used to treat cancer. It is given in the vein (IV). It is given as an IV push from a  syringe and also as a continuous infusion given via an ambulatory pump (a pump you take home and wear for a specified amount of time).  Possible Side Effects  . Bone marrow suppression. This is a decrease in the number of white blood cells, red blood cells, and platelets. This may raise your risk of infection, make you tired and weak (fatigue), and raise your risk of bleeding  . Changes in the tissue of the heart and/or heart attack. Some changes may happen that can cause your heart to have less ability to pump blood.  . Blurred vision or other changes in eyesight  . Nausea and throwing up (vomiting)  . Diarrhea (loose bowel movements)  . Ulcers - sores that may cause pain or bleeding in your digestive tract, which includes your mouth, esophagus, stomach, small/large intestines and rectum  . Soreness of the mouth and throat. You may have red areas, white patches, or sores that hurt.  . Allergic reactions, including anaphylaxis are rare but may happen in some patients. Signs of allergic reaction to this drug may be swelling of the face, feeling like your tongue or throat are swelling, trouble breathing, rash, itching, fever, chills, feeling dizzy, and/or feeling that your heart is beating in a fast or not normal way. If this happens, do not take another dose of this drug. You should get urgent medical treatment.  . Sensitivity to light (photosensitivity). Photosensitivity means that you may become more sensitive to the sun and/or light. You may get a skin rash/reaction if you are in the sun or are exposed to sun lamps and tanning beds. Your eyes may water more, mostly in bright light.  . Changes in your nail color, nail loss and/or brittle nail  . Darkening of the skin, or changes to the color of your skin and/or veins used for infusion  . Rash, dry skin, or itching  Note: Not all possible side effects are included above.  Warnings and Precautions  . Hand-and-foot syndrome. The palms  of your hands or soles of your feet may tingle, become numb, painful, swollen, or red.  . Changes in your central nervous system can happen. The central nervous system is made up of your brain and spinal cord. You could feel extreme tiredness, agitation, confusion, hallucinations (see or hear things that are not there), trouble understanding or speaking, loss of control of your bowels or bladder, eyesight changes, numbness or lack of strength to your arms, legs, face, or body, or coma. If you start to have any of these symptoms let your doctor know right away.  . Side effects of this drug may be unexpectedly severe in some patients  Note: Some of the side effects above are very rare. If you have concerns and/or questions, please discuss them with your medical team.  Important Information  . This drug may be present in the saliva, tears, sweat, urine, stool,  vomit, semen, and vaginal secretions. Talk to your doctor and/or your nurse about the necessary precautions to take during this time.  Treating Side Effects  . Manage tiredness by pacing your activities for the day.  . Be sure to include periods of rest between energy-draining activities.  . To help decrease the risk of infections, wash your hands regularly.  . Avoid close contact with people who have a cold, the flu, or other infections.  . Take your temperature as your doctor or nurse tells you, and whenever you feel like you may have a fever.  . Use a soft toothbrush. Check with your nurse before using dental floss.  . Be very careful when using knives or tools.  . Use an electric shaver instead of a razor.  . If you have a nose bleed, sit with your head tipped slightly forward. Apply pressure by lightly pinching the bridge of your nose between your thumb and forefinger. Call your doctor if you feel dizzy or faint or if the bleeding doesn't stop after 10 to 15 minutes.  . Drink plenty of fluids (a minimum of eight glasses per  day is recommended).  . If you throw up or have loose bowel movements, you should drink more fluids so that you do not become dehydrated (lack of water in the body from losing too much fluid).  . To help with nausea and vomiting, eat small, frequent meals instead of three large meals a day. Choose foods and drinks that are at room temperature. Ask your nurse or doctor about other helpful tips and medicine that is available to help, stop, or lessen these symptoms.  . If you have diarrhea, eat low-fiber foods that are high in protein and calories and avoid foods that can irritate your digestive tracts or lead to cramping.  . Ask your nurse or doctor about medicine that can lessen or stop your diarrhea.  . Mouth care is very important. Your mouth care should consist of routine, gentle cleaning of your teeth or dentures and rinsing your mouth with a mixture of 1/2 teaspoon of salt in 8 ounces of water or 1/2 teaspoon of baking soda in 8 ounces of water. This should be done at least after each meal and at bedtime.  . If you have mouth sores, avoid mouthwash that has alcohol. Also avoid alcohol and smoking because they can bother your mouth and throat.  Marland Kitchen Keeping your nails moisturized may help with brittleness.  . To help with itching, moisturize your skin several times day.  . Use sunscreen with SPF 30 or higher when you are outdoors even for a short time. Cover up when you are out in the sun. Wear wide-brimmed hats, long-sleeved shirts, and pants. Keep your neck, chest, and back covered. Wear dark sun glasses when in the sun or bright lights.  . If you get a rash do not put anything on it unless your doctor or nurse says you may. Keep the area around the rash clean and dry. Ask your doctor for medicine if your rash bothers you.  Marland Kitchen Keeping your pain under control is important to your well-being. Please tell your doctor or nurse if you are experiencing pain.  Food and Drug Interactions  . There  are no known interactions of fluorouracil with food.  . Check with your doctor or pharmacist about all other prescription medicines and over-the-counter medicines and dietary supplements (vitamins, minerals, herbs and others) you are taking before starting this medicine as there  are known drug interactions with 5-fluoroucacil. Also, check with your doctor or pharmacist before starting any new prescription or over-the-counter medicines, or dietary supplements to make sure that there are no interactions.  When to Call the Doctor  Call your doctor or nurse if you have any of these symptoms and/or any new or unusual symptoms:  . Fever of 100.4 F (38 C) or higher  . Chills  . Easy bleeding or bruising  . Nose bleed that doesn't stop bleeding after 10-15 minutes  . Trouble breathing  . Feeling dizzy or lightheaded  . Feeling that your heart is beating in a fast or not normal way (palpitations)  . Chest pain or symptoms of a heart attack. Most heart attacks involve pain in the center of the chest that lasts more than a few minutes. The pain may go away and come back or it can be constant. It can feel like pressure, squeezing, fullness, or pain. Sometimes pain is felt in one or both arms, the back, neck, jaw, or stomach. If any of these symptoms last 2 minutes, call 911.  Marland Kitchen Confusion and/or agitation  . Hallucinations  . Trouble understanding or speaking  . Loss of control of bowels or bladder  . Blurry vision or changes in your eyesight  . Headache that does not go away  . Numbness or lack of strength to your arms, legs, face, or body  . Nausea that stops you from eating or drinking and/or is not relieved by prescribed medicines  . Throwing up  . Diarrhea, 4 times in one day or diarrhea with lack of strength or a feeling of being dizzy  . Pain in your mouth or throat that makes it hard to eat or drink  . Pain along the digestive tract - especially if worse after eating  .  Blood in your vomit (bright red or coffee-ground) and/or stools (bright red, or black/tarry)  . Coughing up blood  . Tiredness that interferes with your daily activities  . Painful, red, or swollen areas on your hands or feet or around your nails  . A new rash or a rash that is not relieved by prescribed medicines  . Develop sensitivity to sunlight/light  . Numbness and/or tingling of your hands and/or feet  . Signs of allergic reaction: swelling of the face, feeling like your tongue or throat are swelling, trouble breathing, rash, itching, fever, chills, feeling dizzy, and/or feeling that your heart is beating in a fast or not normal way. If this happens, call 911 for emergency care.  . If you think you are pregnant or may have impregnated your partner  Reproduction Warnings  . Pregnancy warning: This drug may have harmful effects on the unborn baby. Women of child bearing potential should use effective methods of birth control during your cancer treatment and 3 months after treatment. Men with male partners of childbearing potential should use effective methods of birth control during your cancer treatment and for 3 months after your cancer treatment. Let your doctor know right away if you think you may be pregnant or may have impregnated your partner.  . Breastfeeding warning: It is not known if this drug passes into breast milk. For this reason, Women should not breastfeed during treatment because this drug could enter the breast milk and cause harm to a breastfeeding baby.  . Fertility warning: In men and women both, this drug may affect your ability to have children in the future. Talk with your doctor or  nurse if you plan to have children. Ask for information on sperm or egg banking.   SELF CARE ACTIVITIES WHILE RECEIVING CHEMOTHERAPY:  Hydration Increase your fluid intake 48 hours prior to treatment and drink at least 8 to 12 cups (64 ounces) of water/decaffeinated beverages  per day after treatment. You can still have your cup of coffee or soda but these beverages do not count as part of your 8 to 12 cups that you need to drink daily. No alcohol intake.  Medications Continue taking your normal prescription medication as prescribed.  If you start any new herbal or new supplements please let us know first to make sure it is safe.  Mouth Care Have teeth cleaned professionally before starting treatment. Keep dentures and partial plates clean. Use soft toothbrush and do not use mouthwashes that contain alcohol. Biotene is a good mouthwash that is available at most pharmacies or may be ordered by calling [phone]. Use warm salt water gargles (1 teaspoon salt per 1 quart warm water) before and after meals and at bedtime. If you need dental work, please let the doctor know before you go for your appointment so that we can coordinate the best possible time for you in regards to your chemo regimen. You need to also let your dentist know that you are actively taking chemo. We may need to do labs prior to your dental appointment.  Skin Care Always use sunscreen that has not expired and with SPF (Sun Protection Factor) of 50 or higher. Wear hats to protect your head from the sun. Remember to use sunscreen on your hands, ears, face, & feet.  Use good moisturizing lotions such as udder cream, eucerin, or even Vaseline. Some chemotherapies can cause dry skin, color changes in your skin and nails.    . Avoid long, hot showers or baths. . Use gentle, fragrance-free soaps and laundry detergent. . Use moisturizers, preferably creams or ointments rather than lotions because the thicker consistency is better at preventing skin dehydration. Apply the cream or ointment within 15 minutes of showering. Reapply moisturizer at night, and moisturize your hands every time after you wash them.  Hair Loss (if your doctor says your hair will fall out)  . If your doctor says that your hair is  likely to fall out, decide before you begin chemo whether you want to wear a wig. You may want to shop before treatment to match your hair color. . Hats, turbans, and scarves can also camouflage hair loss, although some people prefer to leave their heads uncovered. If you go bare-headed outdoors, be sure to use sunscreen on your scalp. . Cut your hair short. It eases the inconvenience of shedding lots of hair, but it also can reduce the emotional impact of watching your hair fall out. . Don't perm or color your hair during chemotherapy. Those chemical treatments are already damaging to hair and can enhance hair loss. Once your chemo treatments are done and your hair has grown back, it's OK to resume dyeing or perming hair.  With chemotherapy, hair loss is almost always temporary. But when it grows back, it may be a different color or texture. In older adults who still had hair color before chemotherapy, the new growth may be completely gray.  Often, new hair is very fine and soft.  Infection Prevention Please wash your hands for at least 30 seconds using warm soapy water. Handwashing is the #1 way to prevent the spread of germs. Stay away from sick  people or people who are getting over a cold. If you develop respiratory systems such as green/yellow mucus production or productive cough or persistent cough let us know and we will see if you need an antibiotic. It is a good idea to keep a pair of gloves on when going into grocery stores/[employer] to decrease your risk of coming into contact with germs on the carts, etc. Carry alcohol hand gel with you at all times and use it frequently if out in public. If your temperature reaches 100.4 or higher please call the clinic and let us know.  If it is after hours or on the weekend please go to the ER if your temperature is over 100.4.  Please have your own personal thermometer at home to use.    Sex and bodily fluids If you are going to have sex, a condom must be  used to protect the person that isn't taking chemotherapy. Chemo can decrease your libido (sex drive). For a few days after chemotherapy, chemotherapy can be excreted through your bodily fluids.  When using the toilet please close the lid and flush the toilet twice.  Do this for a few day after you have had chemotherapy.   Effects of chemotherapy on your sex life Some changes are simple and won't last long. They won't affect your sex life permanently.  Sometimes you may feel: . too tired . not strong enough to be very active . sick or sore  . not in the mood . anxious or low  Your anxiety might not seem related to sex. For example, you may be worried about the cancer and how your treatment is going. Or you may be worried about money, or about how you family are coping with your illness.  These things can cause stress, which can affect your interest in sex. It's important to talk to your partner about how you feel.  Remember - the changes to your sex life don't usually last long. There's usually no medical reason to stop having sex during chemo. The drugs won't have any long term physical effects on your performance or enjoyment of sex. Cancer can't be passed on to your partner during sex  Contraception It's important to use reliable contraception during treatment. Avoid getting pregnant while you or your partner are having chemotherapy. This is because the drugs may harm the baby. Sometimes chemotherapy drugs can leave a man or woman infertile.  This means you would not be able to have children in the future. You might want to talk to someone about permanent infertility. It can be very difficult to learn that you may no longer be able to have children. Some people find counselling helpful. There might be ways to preserve your fertility, although this is easier for men than for women. You may want to speak to a fertility expert. You can talk about sperm banking or harvesting your eggs. You can also ask  about other fertility options, such as donor eggs. If you have or have had breast cancer, your doctor might advise you not to take the contraceptive pill. This is because the hormones in it might affect the cancer. It is not known for sure whether or not chemotherapy drugs can be passed on through semen or secretions from the vagina. Because of this some doctors advise people to use a barrier method if you have sex during treatment. This applies to vaginal, anal or oral sex. Generally, doctors advise a barrier method only for the time you  are actually having the treatment and for about a week after your treatment. Advice like this can be worrying, but this does not mean that you have to avoid being intimate with your partner. You can still have close contact with your partner and continue to enjoy sex.  Animals If you have cats or birds we just ask that you not change the litter or change the cage.  Please have someone else do this for you while you are on chemotherapy.   Food Safety During and After Cancer Treatment Food safety is important for people both during and after cancer treatment. Cancer and cancer treatments, such as chemotherapy, radiation therapy, and stem cell/bone marrow transplantation, often weaken the immune system. This makes it harder for your body to protect itself from foodborne illness, also called food poisoning. Foodborne illness is caused by eating food that contains harmful bacteria, parasites, or viruses.  Foods to avoid Some foods have a higher risk of becoming tainted with bacteria. These include: Marland Kitchen Unwashed fresh fruit and vegetables, especially leafy vegetables that can hide dirt and other contaminants . Raw sprouts, such as alfalfa sprouts . Raw or undercooked beef, especially ground beef, or other raw or undercooked meat and poultry . Fatty, fried, or spicy foods immediately before or after treatment.  These can sit heavy on your stomach and make you feel  nauseous. . Raw or undercooked shellfish, such as oysters. . Sushi and sashimi, which often contain raw fish.  . Unpasteurized beverages, such as unpasteurized fruit juices, raw milk, raw yogurt, or cider . Undercooked eggs, such as soft boiled, over easy, and poached; raw, unpasteurized eggs; or foods made with raw egg, such as homemade raw cookie dough and homemade mayonnaise  Simple steps for food safety  Shop smart. . Do not buy food stored or displayed in an unclean area. . Do not buy bruised or damaged fruits or vegetables. . Do not buy cans that have cracks, dents, or bulges. . Pick up foods that can spoil at the end of your shopping trip and store them in a cooler on the way home.  Prepare and clean up foods carefully. . Rinse all fresh fruits and vegetables under running water, and dry them with a clean towel or paper towel. . Clean the top of cans before opening them. . After preparing food, wash your hands for 20 seconds with hot water and soap. Pay special attention to areas between fingers and under nails. . Clean your utensils and dishes with hot water and soap. Marland Kitchen Disinfect your kitchen and cutting boards using 1 teaspoon of liquid, unscented bleach mixed into 1 quart of water.    Dispose of old food. . Eat canned and packaged food before its expiration date (the "use by" or "best before" date). . Consume refrigerated leftovers within 3 to 4 days. After that time, throw out the food. Even if the food does not smell or look spoiled, it still may be unsafe. Some bacteria, such as Listeria, can grow even on foods stored in the refrigerator if they are kept for too long.  Take precautions when eating out. . At restaurants, avoid buffets and salad bars where food sits out for a long time and comes in contact with many people. Food can become contaminated when someone with a virus, often a norovirus, or another "bug" handles it. . Put any leftover food in a "to-go" container  yourself, rather than having the server do it. And, refrigerate leftovers as soon as  you get home. . Choose restaurants that are clean and that are willing to prepare your food as you order it cooked.   AT HOME MEDICATIONS:                                                                                                                                                                Compazine/Prochlorperazine 10mg  tablet. Take 1 tablet every 6 hours as needed for nausea/vomiting. (This can make you sleepy)   EMLA cream. Apply a quarter size amount to port site 1 hour prior to chemo. Do not rub in. Cover with plastic wrap.    Diarrhea Sheet   If you are having loose stools/diarrhea, please purchase Imodium and begin taking as outlined:  At the first sign of poorly formed or loose stools you should begin taking Imodium (loperamide) 2 mg capsules.  Take two tablets (4mg ) followed by one tablet (2mg ) every 2 hours - DO NOT EXCEED 8 tablets in 24 hours.  If it is bedtime and you are having loose stools, take 2 tablets at bedtime, then 2 tablets every 4 hours until morning.   Always call the [facility] if you are having loose stools/diarrhea that you can't get under control.  Loose stools/diarrhea leads to dehydration (loss of water) in your body.  We have other options of trying to get the loose stools/diarrhea to stop but you must let us know!   Constipation Sheet  Colace - 100 mg capsules - take 2 capsules daily.  If this doesn't help then you can increase to 2 capsules twice daily.  Please call if the above does not work for you. Do not go more than 2 days without a bowel movement.  It is very important that you do not become constipated.  It will make you feel sick to your stomach (nausea) and can cause abdominal pain and vomiting.  Nausea Sheet   Compazine/Prochlorperazine 10mg  tablet. Take 1 tablet every 6 hours as needed for nausea/vomiting (This can make you drowsy).  If you are  having persistent nausea (nausea that does not stop) please call the [facility] and let us know the amount of nausea that you are experiencing.  If you begin to vomit, you need to call the [facility] and if it is the weekend and you have vomited more than one time and can't get it to stop-go to the Emergency Room.  Persistent nausea/vomiting can lead to dehydration (loss of fluid in your body) and will make you feel very weak and unwell. Ice chips, sips of clear liquids, foods that are at room temperature, crackers, and toast tend to be better tolerated.   SYMPTOMS TO REPORT AS SOON AS POSSIBLE AFTER TREATMENT:  FEVER GREATER THAN 100.4 F  CHILLS WITH OR  WITHOUT FEVER  NAUSEA AND VOMITING THAT IS NOT CONTROLLED WITH YOUR NAUSEA MEDICATION  UNUSUAL SHORTNESS OF BREATH  UNUSUAL BRUISING OR BLEEDING  TENDERNESS IN MOUTH AND THROAT WITH OR WITHOUT PRESENCE OF ULCERS  URINARY PROBLEMS  BOWEL PROBLEMS  UNUSUAL RASH      Wear comfortable clothing and clothing appropriate for easy access to any Portacath or PICC line. Let us know if there is anything that we can do to make your therapy better!    What to do if you need assistance after hours or on the weekends: CALL [phone].  HOLD on the line, do not hang up.  You will hear multiple messages but at the end you will be connected with a nurse triage line.  They will contact the doctor if necessary.  Most of the time they will be able to assist you.  Do not call the hospital operator.      I have been informed and understand all of the instructions given to me and have received a copy. I have been instructed to call the clinic [phone] or my family physician as soon as possible for continued medical care, if indicated. I do not have any more questions at this time but understand that I may call the [facility] or the Patient Navigator at [phone] during office hours should I have questions or need assistance in  obtaining follow-up care.

## 2020-05-25 NOTE — Patient Instructions (Signed)
I appreciate the opportunity to provide you with care for your health and wellness. Today we discussed: established care  Follow up: 2 months for A1c check  No labs or referrals today  Nice to meet you today. Wishing you well on your cancer treatments.  Please let them know your stomach/abdomen is still bothering you.  Please continue to practice social distancing to keep you, your family, and our community safe.  If you must go out, please wear a mask and practice good handwashing.  It was a pleasure to see you and I look forward to continuing to work together on your health and well-being. Please do not hesitate to call the office if you need care or have questions about your care.  Have a wonderful day and week. With Gratitude, Cherly Beach, DNP, AGNP-BC

## 2020-05-25 NOTE — Progress Notes (Signed)
Subjective:  Patient ID: John Islam Sr., male    DOB: 1951-05-06  Age: 69 y.o. MRN: [id number]  CC:  Chief Complaint  Patient presents with  . New Patient (Initial Visit)    new pt stomach hurts right in the middle really bad has been hurting a couple of days constant pain nothing helps it place where he had port put in itches really bad its been about 2 days since placement       HPI  HPI   [name] is a 69 year old male patient who presents today to establish care.  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  He reports that he does not sleep well he naps frequently.  He reports that he has had appetite change.  He has several missing teeth does not see a dentist at this time.  No trouble chewing or swallowing but his appetite just is not where it was.  He denies having any nausea or vomiting.  He denies having any diarrhea or constipation.  Denies having any blood in urine or stool.  However he continues to have pain not only in the upper abdomen but also in the lower abdomen towards the right.  He reports that his cancer doctor is helping him with this pain however it is not gotten much better.  He denies having any significant memory issues but reports some forgetfulness.  He denies having any falls or injury.  He denies having any hearing changes.  Reports some vision changes his glasses are at least 68 years old if not more.  Does have an eye doctor appointment coming up in November.  He reports that he gets several spots on his arms he is unsure what they are called from.  He has no itching, pain associated with them.  He denies them being in insect bites.  He picks at them and that is what creates the sore.  He has received his Covid vaccines.   Most recent A1c was 10.8% on July 31.  He is taking Metformin 500 mg twice daily.  He is not taking any other treatments for his diabetes.  He reports his fasting blood sugars are usually over 200.  He has not had a colonoscopy.  It does not appear that his PSA has been checked recently either.  Today patient denies signs and symptoms of COVID 19 infection including fever, chills, cough, shortness of breath, and headache. Past Medical, Surgical, Social History, Allergies, and Medications have been Reviewed.   Past Medical History:  Diagnosis Date  . Acute blood loss anemia   . Arthritis   . Benign essential HTN   . Cellulitis 07/20/2018  . Cellulitis in diabetic foot ([place]) 07/20/2018  . Diabetes mellitus without complication ([place])   . Diabetes mellitus, type II ([place]) 07/20/2018  . 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 Emesis   . Family history of cervical cancer   . Heart murmur   . Hyperglycemia   . Hypertension   . Joint pain   . Port-A-Cath in place 05/25/2020  . Unilateral complete BKA, left, initial encounter ([place])     Current Meds  Medication Sig  . blood glucose meter kit and supplies KIT Dispense based on patient and insurance preference. Use up to four  times daily as directed. (FOR ICD-9 250.00, 250.01).  Marland Kitchen HYDROcodone-acetaminophen (NORCO) 5-325 MG tablet Take 1 tablet by mouth every 4 (four) hours as needed for moderate pain.  . metFORMIN (GLUCOPHAGE) 500 MG tablet Take 2 tablets (1,000 mg total) by mouth 2 (two) times daily with a meal.  . [DISCONTINUED] aspirin EC 81 MG EC tablet Take 1 tablet (81 mg total) by mouth daily.  . [DISCONTINUED] metFORMIN (GLUCOPHAGE) 500 MG tablet Take 1 tablet (500 mg total) by mouth 2 (two) times daily with a meal.    ROS:  Review of Systems  HENT: Negative.   Eyes: Negative.   Respiratory: Negative.   Cardiovascular: Negative.   Gastrointestinal: Positive for abdominal pain.  Genitourinary: Negative.   Musculoskeletal: Negative.     Skin: Negative.   Neurological: Negative.   Endo/Heme/Allergies: Negative.   Psychiatric/Behavioral: Negative.      Objective:   Today's Vitals: BP 114/72 (BP Location: Left Arm, Patient Position: Sitting, Cuff Size: Normal)   Pulse 91   Temp 97.8 F (36.6 C) (Temporal)   Resp 18   Ht 5\' 11"  (1.803 m)   Wt 145 lb 1.9 oz (65.8 kg)   SpO2 99%   BMI 20.24 kg/m  Vitals with BMI 05/25/2020 05/24/2020 05/21/2020  Height 5\' 11"  - -  Weight 145 lbs 2 oz 147 lbs 11 oz -  BMI 20.25 20.61 -  Systolic 114 140 05/23/2020  Diastolic 72 75 58  Pulse 91 91 77     Physical Exam Vitals and nursing note reviewed.  Constitutional:      Appearance: Normal appearance. He is well-developed, well-groomed and normal weight.  HENT:     Head: Normocephalic and atraumatic.     Right Ear: External ear normal.     Left Ear: External ear normal.     Mouth/Throat:     Comments: Mask in place Eyes:     General:        Right eye: No discharge.        Left eye: No discharge.     Conjunctiva/sclera: Conjunctivae normal.  Cardiovascular:     Rate and Rhythm: Normal rate and regular rhythm.     Pulses: Normal pulses.     Heart sounds: Normal heart sounds.  Pulmonary:     Effort: Pulmonary effort is normal.     Breath sounds: Normal breath sounds.  Musculoskeletal:        General: Normal range of motion.     Cervical back: Normal range of motion and neck supple.     Comments: Left leg prosthesis  Skin:    General: Skin is warm.  Neurological:     General: No focal deficit present.     Mental Status: He is alert and oriented to person, place, and time.     Cranial Nerves: Cranial nerves are intact.     Sensory: Sensation is intact.     Motor: Motor function is intact.     Coordination: Coordination is intact.     Gait: Gait is intact.  Psychiatric:        Attention and Perception: Attention and perception normal.        Mood and Affect: Mood and affect normal.        Speech: Speech normal.         Behavior: Behavior normal. Behavior is cooperative.        Thought Content: Thought content normal.        Cognition and Memory: Cognition and  memory normal.        Judgment: Judgment normal.     Assessment   1. Type 2 diabetes mellitus with peripheral neuropathy (HCC)   2. Pancreatic cancer metastasized to liver ([place])   3. 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 Essential hypertension   5. Tobacco abuse   6. Dyslipidemia     Tests ordered No orders of the defined types were placed in this encounter.    Plan: Please see assessment and plan per problem list above.   Meds ordered this encounter  Medications  . metFORMIN (GLUCOPHAGE) 500 MG tablet    Sig: Take 2 tablets (1,000 mg total) by mouth 2 (two) times daily with a meal.    Dispense:  60 tablet    Refill:  3    Order Specific Question:   Supervising Provider    Answer:   Fayrene Helper [[id number]]    Patient to follow-up in 2 months  Perlie Mayo, NP

## 2020-05-25 NOTE — Progress Notes (Signed)
[facility] Initial Psychosocial Assessment Clinical Social Work  Clinical Social Work contacted by phone to assess psychosocial, emotional, mental health, and spiritual needs of the patient.   Barriers to care/review of distress screen:  - Transportation:  Do you anticipate any problems getting to appointments?  Do you have someone who can help run errands for you if you need it?  Wife and daughter transport in county, has Medicaid transport to use for out of county transport.   - Help at home:  What is your living situation (alone, family, other)?  If you are physically unable to care for yourself, who would you call on to help you?  Lives w wife, she is primary caregiver.  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 "They put some pads inside the prosthetic leg, gave him gel liners", wife thinks he may not be using the leg correctly.  Was more active prior to cancer diagnosis, "I wonder if he is going into depression mode."  Does not like to eat "it messes w my taste buds".  "If he cant eat what he wants to eat, he will not eat."  Has decreased his intake since cancer diagnosis.   - Support system:  What does your support system look like?  Who would you call on if you needed some kind of practical help?  What if you needed someone to talk to for emotional support? Wife and daughter are primary supports.  Wife is "the main one he opens up to."  Was depressed when lost his leg, wife is the one he talks to and the one who motivates him.  Very attached to wife and her support. - Finances:  Are you concerned about finances.  Considering returning to work?  If not, applying for disability?  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  Has Medicare and Medicaid  Limited income, has difficulty w bills but we "manage.".   What is your understanding of where you  are with your cancer? Its cause?  Your treatment plan and what happens next?  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  Staging TBD.  Had stomach ache, came to ED, was diagnosed w cancer.    What are your worries for the future as you begin treatment for cancer?  Not being able to have supportive wife around. Wife watches grandchildren but she would like to be able to be present during chemotherapy.  "He does not tell us much about his appointments."   Wife wants to know more about his treatment and what he needs to do as he is not a good historian.    What are your hopes and priorities during your treatment? What is important to you? What are your goals for your care?  He is diabetic, needs more help w diabetic meal planning.  Advised to contact PCP for those needs.    CSW Summary:  Patient and family psychosocial functioning including strengths, limitations, and coping skills:  69 year old male, living w significant other in [place], newly diagnosed w pancreatic cancer after presenting to ED in early August w severe stomach ache.  Has sister who died from cervical cancer.  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  Per significant other, he is not a good communicator about what he needs to do re his medical condition.  He is not telling family in advance about appointments, is sometimes confused.  Per significant other, "he looks like he understands what you are saying when hes with you, then does what he wants to do when he's at home."  She would like to be more helpful in his care, but wants more information about what he needs to do.  She would like to be paid as a CAP-DA in home aide as she feels she provides most of his care and he depends on her.  Per wife, he "does not like people coming into the house", so she does not think  Medicaid Ches[place] would work for him.  CSW and patient discussed common feeling and emotions when being diagnosed with cancer, and the importance of support during treatment. CSW informed patient of the support team and support services at [facility]. CSW provided contact information and encouraged patient to call with any questions or concerns.  Identifications of barriers to care:  Difficulty eating, new diagnosis of pancreatic cancer.  Difficulty w transport to out of county appointments.    Availability of community resources:  [place] for CAP-DA, [place] Worker follow up needed: No.  Please reconsult if needed.  Edwyna Shell, LCSW Clinical Social Worker Phone:  [phone] Cell:  [phone]

## 2020-05-26 ENCOUNTER — Other Ambulatory Visit (HOSPITAL_COMMUNITY): Payer: Medicare Other

## 2020-05-26 ENCOUNTER — Ambulatory Visit (HOSPITAL_COMMUNITY): Payer: Medicare Other

## 2020-05-26 ENCOUNTER — Other Ambulatory Visit: Payer: Self-pay

## 2020-05-26 ENCOUNTER — Inpatient Hospital Stay (HOSPITAL_COMMUNITY): Payer: Medicare Other | Attending: Hematology

## 2020-05-26 ENCOUNTER — Telehealth: Payer: Self-pay | Admitting: Genetic Counselor

## 2020-05-26 DIAGNOSIS — C259 Malignant neoplasm of pancreas, unspecified: Secondary | ICD-10-CM | POA: Insufficient documentation

## 2020-05-26 DIAGNOSIS — E876 Hypokalemia: Secondary | ICD-10-CM | POA: Insufficient documentation

## 2020-05-26 DIAGNOSIS — C787 Secondary malignant neoplasm of liver and intrahepatic bile duct: Secondary | ICD-10-CM | POA: Insufficient documentation

## 2020-05-26 DIAGNOSIS — Z5111 Encounter for antineoplastic chemotherapy: Secondary | ICD-10-CM | POA: Insufficient documentation

## 2020-05-26 NOTE — Telephone Encounter (Signed)
Called to discuss and set up genetic testing with Mr. Belson. Per his wife, he is not home at the moment. We will call back tomorrow afternoon, when Mr. Colter will be home.

## 2020-05-27 ENCOUNTER — Telehealth: Payer: Self-pay | Admitting: Genetic Counselor

## 2020-05-27 ENCOUNTER — Other Ambulatory Visit: Payer: Self-pay | Admitting: Genetic Counselor

## 2020-05-27 DIAGNOSIS — C787 Secondary malignant neoplasm of liver and intrahepatic bile duct: Secondary | ICD-10-CM

## 2020-05-27 NOTE — Telephone Encounter (Signed)
[name] is comfortable moving forward with genetic testing at this time. We have arranged for him to have his blood drawn for genetics with his next labs on 06/09/20.

## 2020-05-28 ENCOUNTER — Encounter (HOSPITAL_COMMUNITY): Payer: Medicare Other

## 2020-06-01 ENCOUNTER — Inpatient Hospital Stay (HOSPITAL_COMMUNITY): Payer: Medicare Other

## 2020-06-01 ENCOUNTER — Other Ambulatory Visit: Payer: Self-pay

## 2020-06-01 NOTE — Progress Notes (Signed)

## 2020-06-03 ENCOUNTER — Inpatient Hospital Stay (HOSPITAL_COMMUNITY): Payer: Medicare Other

## 2020-06-04 NOTE — Progress Notes (Signed)
Pharmacist Chemotherapy Monitoring - Initial Assessment    Anticipated start date: 06/09/20  Regimen:  . Are orders appropriate based on the patient's diagnosis, regimen, and cycle? Yes . Does the plan date match the patient's scheduled date? Yes . Is the sequencing of drugs appropriate? Yes . Are the premedications appropriate for the patient's regimen? Yes . Prior Authorization for treatment is: pending o If applicable, is the correct biosimilar selected based on the patient's insurance? not applicable  Organ Function and Labs: Marland Kitchen Are dose adjustments needed based on the patient's renal function, hepatic function, or hematologic function? No . Are appropriate labs ordered prior to the start of patient's treatment? Yes . Other organ system assessment, if indicated: N/A . The following baseline labs, if indicated, have been ordered: N/A  Dose Assessment: . Are the drug doses appropriate? Yes . Are the following correct: o Drug concentrations Yes o IV fluid compatible with drug Yes o Administration routes Yes o Timing of therapy Yes . If applicable, does the patient have documented access for treatment and/or plans for port-a-cath placement? yes . If applicable, have lifetime cumulative doses been properly documented and assessed? not applicable Lifetime Dose Tracking  No doses have been documented on this patient for the following tracked chemicals: Doxorubicin, Epirubicin, Idarubicin, Daunorubicin, Mitoxantrone, Bleomycin, Oxaliplatin, Carboplatin, Liposomal Doxorubicin  o   Toxicity Monitoring/Prevention: . The patient has the following take home antiemetics prescribed: Prochlorperazine . The patient has the following take home medications prescribed: N/A . Medication allergies and previous infusion related reactions, if applicable, have been reviewed and addressed. Yes . The patient's current medication list has been assessed for drug-drug interactions with their chemotherapy  regimen. no significant drug-drug interactions were identified on review.  Order Review: . Are the treatment plan orders signed? Yes . Is the patient scheduled to see a provider prior to their treatment? Yes  I verify that I have reviewed each item in the above checklist and answered each question accordingly.  Romualdo Bolk [facility] 06/04/2020 1:56 PM

## 2020-06-09 ENCOUNTER — Inpatient Hospital Stay (HOSPITAL_COMMUNITY): Payer: Medicare Other

## 2020-06-09 ENCOUNTER — Other Ambulatory Visit: Payer: Self-pay

## 2020-06-09 ENCOUNTER — Other Ambulatory Visit (HOSPITAL_COMMUNITY): Payer: Self-pay | Admitting: Hematology

## 2020-06-09 ENCOUNTER — Inpatient Hospital Stay (HOSPITAL_BASED_OUTPATIENT_CLINIC_OR_DEPARTMENT_OTHER): Payer: Medicare Other | Admitting: Hematology

## 2020-06-09 ENCOUNTER — Ambulatory Visit (HOSPITAL_COMMUNITY)
Admission: RE | Admit: 2020-06-09 | Discharge: 2020-06-09 | Disposition: A | Discharge status: Home / Self Care | Payer: Medicare Other | Source: Ambulatory Visit | Attending: Hematology | Admitting: Hematology

## 2020-06-09 VITALS — BP 155/70 | HR 84 | Temp 96.9°F | Resp 18

## 2020-06-09 DIAGNOSIS — C787 Secondary malignant neoplasm of liver and intrahepatic bile duct: Secondary | ICD-10-CM | POA: Diagnosis present

## 2020-06-09 DIAGNOSIS — Z5111 Encounter for antineoplastic chemotherapy: Secondary | ICD-10-CM | POA: Diagnosis present

## 2020-06-09 DIAGNOSIS — C259 Malignant neoplasm of pancreas, unspecified: Secondary | ICD-10-CM | POA: Diagnosis present

## 2020-06-09 DIAGNOSIS — E876 Hypokalemia: Secondary | ICD-10-CM | POA: Diagnosis present

## 2020-06-09 DIAGNOSIS — E860 Dehydration: Secondary | ICD-10-CM

## 2020-06-09 LAB — CBC WITH DIFFERENTIAL/PLATELET
Abs Immature Granulocytes: 0.08 10*3/uL — ABNORMAL HIGH (ref 0.00–0.07)
Basophils Absolute: 0.1 10*3/uL (ref 0.0–0.1)
Basophils Relative: 1 %
Eosinophils Absolute: 0.5 10*3/uL (ref 0.0–0.5)
Eosinophils Relative: 4 %
HCT: 36.1 % — ABNORMAL LOW (ref 39.0–52.0)
Hemoglobin: 12 g/dL — ABNORMAL LOW (ref 13.0–17.0)
Immature Granulocytes: 1 %
Lymphocytes Relative: 12 %
Lymphs Abs: 1.7 10*3/uL (ref 0.7–4.0)
MCH: 28.5 pg (ref 26.0–34.0)
MCHC: 33.2 g/dL (ref 30.0–36.0)
MCV: 85.7 fL (ref 80.0–100.0)
Monocytes Absolute: 1.3 10*3/uL — ABNORMAL HIGH (ref 0.1–1.0)
Monocytes Relative: 9 %
Neutro Abs: 10.2 10*3/uL — ABNORMAL HIGH (ref 1.7–7.7)
Neutrophils Relative %: 73 %
Platelets: 439 10*3/uL — ABNORMAL HIGH (ref 150–400)
RBC: 4.21 MIL/uL — ABNORMAL LOW (ref 4.22–5.81)
RDW: 16.3 % — ABNORMAL HIGH (ref 11.5–15.5)
WBC: 13.8 10*3/uL — ABNORMAL HIGH (ref 4.0–10.5)
nRBC: 0 % (ref 0.0–0.2)

## 2020-06-09 LAB — COMPREHENSIVE METABOLIC PANEL
ALT: 56 U/L — ABNORMAL HIGH (ref 0–44)
AST: 73 U/L — ABNORMAL HIGH (ref 15–41)
Albumin: 2.6 g/dL — ABNORMAL LOW (ref 3.5–5.0)
Alkaline Phosphatase: 830 U/L — ABNORMAL HIGH (ref 38–126)
Anion gap: 12 (ref 5–15)
BUN: 11 mg/dL (ref 8–23)
CO2: 25 mmol/L (ref 22–32)
Calcium: 9 mg/dL (ref 8.9–10.3)
Chloride: 97 mmol/L — ABNORMAL LOW (ref 98–111)
Creatinine, Ser: 0.37 mg/dL — ABNORMAL LOW (ref 0.61–1.24)
GFR calc Af Amer: >60 mL/min (ref >60)
GFR calc non Af Amer: >60 mL/min (ref >60)
Glucose, Bld: 313 mg/dL — ABNORMAL HIGH (ref 70–99)
Potassium: 3.3 mmol/L — ABNORMAL LOW (ref 3.5–5.1)
Sodium: 134 mmol/L — ABNORMAL LOW (ref 135–145)
Total Bilirubin: 17.9 mg/dL — ABNORMAL HIGH (ref 0.3–1.2)
Total Protein: 7.3 g/dL (ref 6.5–8.1)

## 2020-06-09 LAB — BILIRUBIN, DIRECT: Bilirubin, Direct: 12.6 mg/dL — ABNORMAL HIGH (ref 0.0–0.2)

## 2020-06-09 MED ORDER — GADOBUTROL 1 MMOL/ML IV SOLN
7.0000 mL | Freq: Once | INTRAVENOUS | Status: AC | PRN
  Administered 2020-06-09: 7 mL via INTRAVENOUS

## 2020-06-09 MED ORDER — SODIUM CHLORIDE 0.9 % IV SOLN
Freq: Once | INTRAVENOUS | Status: AC
  Filled 2020-06-09: qty 1000

## 2020-06-09 NOTE — Progress Notes (Signed)
Patient was assessed by Dr. Delton Coombes and labs have been reviewed.  Patient's bilirubin 17.9, orders received for STAT MRCP and 1 liter of fluids with electrolytes today.  Primary RN and pharmacy aware.

## 2020-06-09 NOTE — Patient Instructions (Signed)
[facility] at [facility]  Discharge Instructions:   _______________________________________________________________  Thank you for choosing [facility] at [facility] to provide your oncology and hematology care.  To afford each patient quality time with our providers, please arrive at least 15 minutes before your scheduled appointment.  You need to re-schedule your appointment if you arrive 10 or more minutes late.  We strive to give you quality time with our providers, and arriving late affects you and other patients whose appointments are after yours.  Also, if you no show three or more times for appointments you may be dismissed from the clinic.  Again, thank you for choosing [facility] at [facility]. Our hope is that these requests will allow you access to exceptional care and in a timely manner. _______________________________________________________________  If you have questions after your visit, please contact our office at [phone] between the hours of 8:30 a.m. and 5:00 p.m. Voicemails left after 4:30 p.m. will not be returned until the following business day. _______________________________________________________________  For prescription refill requests, have your pharmacy contact our office. _______________________________________________________________  Recommendations made by the consultant and any test results will be sent to your referring physician. _______________________________________________________________ 

## 2020-06-09 NOTE — Patient Instructions (Signed)
[facility] at [facility] Discharge Instructions  Labs drawn from portacath today   Thank you for choosing [facility] at [facility] to provide your oncology and hematology care.  To afford each patient quality time with our provider, please arrive at least 15 minutes before your scheduled appointment time.   If you have a lab appointment with the Cancer Center please come in thru the Main Entrance and check in at the main information desk.  You need to re-schedule your appointment should you arrive 10 or more minutes late.  We strive to give you quality time with our providers, and arriving late affects you and other patients whose appointments are after yours.  Also, if you no show three or more times for appointments you may be dismissed from the clinic at the providers discretion.     Again, thank you for choosing [facility].  Our hope is that these requests will decrease the amount of time that you wait before being seen by our physicians.       _____________________________________________________________  Should you have questions after your visit to [facility], please contact our office at [phone] and follow the prompts.  Our office hours are 8:00 a.m. and 4:30 p.m. Monday - Friday.  Please note that voicemails left after 4:00 p.m. may not be returned until the following business day.  We are closed weekends and major holidays.  You do have access to a nurse 24-7, just call the main number to the clinic [phone] and do not press any options, hold on the line and a nurse will answer the phone.    For prescription refill requests, have your pharmacy contact our office and allow 72 hours.    Due to Covid, you will need to wear a mask upon entering the hospital. If you do not have a mask, a mask will be given to you at the Main Entrance upon arrival. For doctor visits, patients may have 1 support person age 18  or older with them. For treatment visits, patients can not have anyone with them due to social distancing guidelines and our immunocompromised population.     

## 2020-06-09 NOTE — Patient Instructions (Signed)
[facility] at [facility] Discharge Instructions  You were seen today by Dr. Delton Coombes. He went over your recent results. You did not receive your treatment today. You will be sent for an MRCP to check out the blockage that is causing your pigment to increase in your blood. Dr. Delton Coombes will see you back in 1 week for labs and follow up.   Thank you for choosing [place] at [facility] to provide your oncology and hematology care.  To afford each patient quality time with our provider, please arrive at least 15 minutes before your scheduled appointment time.   If you have a lab appointment with the [facility] please come in thru the Main Entrance and check in at the main information desk  You need to re-schedule your appointment should you arrive 10 or more minutes late.  We strive to give you quality time with our providers, and arriving late affects you and other patients whose appointments are after yours.  Also, if you no show three or more times for appointments you may be dismissed from the clinic at the providers discretion.     Again, thank you for choosing [facility].  Our hope is that these requests will decrease the amount of time that you wait before being seen by our physicians.       _____________________________________________________________  Should you have questions after your visit to [facility], please contact our office at [phone] between the hours of 8:00 a.m. and 4:30 p.m.  Voicemails left after 4:00 p.m. will not be returned until the following business day.  For prescription refill requests, have your pharmacy contact our office and allow 72 hours.    Cancer Center Support Programs:   > Cancer Support Group  2nd Tuesday of the month 1pm-2pm, Journey Room

## 2020-06-09 NOTE — Progress Notes (Signed)
Patient presents today for treatment and follow up visit with Dr. Delton Coombes. Total bilirubin 17.9 today. Dr. Delton Coombes at the bedside. Verbal order received NO treatment today. Patient jaundice. Patient has complaints of abdominal pain. Vital signs stable.   Patient is scheduled for a MRCP at [facility] fluids given today per MD orders. Tolerated infusion without adverse affects. Vital signs stable. No complaints at this time. Discharged from clinic via wheel chair to Radiology. Daughter escorted patient. F/U with [facility] as scheduled.

## 2020-06-09 NOTE — Progress Notes (Signed)
[name], [place] [address]   CLINIC:  Medical Oncology/Hematology  PCP:  Perlie Mayo, NP [address] 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  PRIOR THERAPY: None  NGS Results: Not done  CURRENT THERAPY: FOLFIRINOX  BRIEF ONCOLOGIC HISTORY:  Oncology History  Pancreatic cancer metastasized to liver ([place])  05/21/2020 Initial Diagnosis   Pancreatic cancer metastasized to liver ([place])   06/09/2020 -  Chemotherapy   The patient had palonosetron (ALOXI) injection 0.25 mg, 0.25 mg, Intravenous,  Once, 0 of 4 cycles irinotecan (CAMPTOSAR) 200 mg in sodium chloride 0.9 % 500 mL chemo infusion, 112.5 mg/m2 = 200 mg (75 % of original dose 150 mg/m2), Intravenous,  Once, 0 of 4 cycles Dose modification: 112.5 mg/m2 (75 % of original dose 150 mg/m2, Cycle 1, Reason: Provider Judgment) oxaliplatin (ELOXATIN) 115 mg in dextrose 5 % 500 mL chemo infusion, 63.75 mg/m2 = 115 mg (75 % of original dose 85 mg/m2), Intravenous,  Once, 0 of 4 cycles Dose modification: 63.75 mg/m2 (75 % of original dose 85 mg/m2, Cycle 1, Reason: Provider Judgment) fosaprepitant (EMEND) 150 mg in sodium chloride 0.9 % 145 mL IVPB, 150 mg, Intravenous,  Once, 0 of 4 cycles fluorouracil (ADRUCIL) 3,300 mg in sodium chloride 0.9 % 84 mL chemo infusion, 1,800 mg/m2 = 3,300 mg (75 % of original dose 2,400 mg/m2), Intravenous, 1 Day/Dose, 0 of 4 cycles Dose modification: 1,800 mg/m2 (75 % of original dose 2,400 mg/m2, Cycle 1, Reason: Provider Judgment) leucovorin 550 mg in sodium chloride 0.9 % 250 mL infusion, 300 mg/m2 = 550 mg (75 % of original dose 400 mg/m2), Intravenous,  Once, 0 of 4 cycles Dose modification: 300 mg/m2 (75 % of original dose 400 mg/m2, Cycle 1, Reason: Provider Judgment)  for chemotherapy treatment.      CANCER STAGING: Cancer Staging No matching staging information was found  for the patient.  INTERVAL HISTORY:  [name], a 69 y.o. male, returns for routine follow-up and consideration for first cycle of chemotherapy. [name] was last seen on 05/24/2020.  Due for initiating cycle #1 of FOLFIRINOX today.   Today he reports feeling horrible due to his abdominal pain which has been hurting for a couple of days. He has been tolerating feedings and denies nausea or vomiting. His appetite has been severely decreased. He has not had a BM for several days and doesn't remember the color of his stool; his urine has been clear. He also notes that he started turning yellow, though he doesn't remember when that began.   REVIEW OF SYSTEMS:  Review of Systems  Constitutional: Positive for appetite change (severely decreased) and fatigue (moderate).  HENT:   Positive for trouble swallowing (chewing difficulty d/t bad teeth).   Cardiovascular: Positive for chest pain and palpitations.  Gastrointestinal: Positive for abdominal pain. Negative for nausea and vomiting.  Musculoskeletal: Positive for arthralgias (9/10 L shoulder pain).  Neurological: Positive for dizziness.  All other systems reviewed and are negative.   PAST MEDICAL/SURGICAL HISTORY:  Past Medical History:  Diagnosis Date   Acute blood loss anemia    Arthritis    Benign essential HTN    Cellulitis 07/20/2018   Cellulitis in diabetic foot ([place]) 07/20/2018   Diabetes mellitus without complication ([place])    Diabetes mellitus, type II ([place]) 07/20/2018   Dry gangrene ([place]) 07/20/2018   Emesis  Family history of cervical cancer    Heart murmur    Hyperglycemia    Hypertension    Joint pain    Port-A-Cath in place 05/25/2020   Unilateral complete BKA, left, initial encounter [facility])    Past Surgical History:  Procedure Laterality Date   AMPUTATION Left 08/02/2018   Procedure: LEFT AMPUTATION BELOW KNEE;  Surgeon: Marty Heck, MD;  Location: [facility] OR;  Service: Vascular;   Laterality: Left;   ELBOW SURGERY     Bone graft to repair elbow   LOWER EXTREMITY ANGIOGRAPHY N/A 07/24/2018   Procedure: LOWER EXTREMITY ANGIOGRAPHY;  Surgeon: Marty Heck, MD;  Location: [facility];  Service: Cardiovascular;  Laterality: N/A;   PERIPHERAL VASCULAR INTERVENTION  07/24/2018   Procedure: PERIPHERAL VASCULAR INTERVENTION;  Surgeon: Marty Heck, MD;  Location: [facility];  Service: Cardiovascular;;   PORTACATH PLACEMENT Left 05/21/2020   Procedure: INSERTION PORT-A-CATH;  Surgeon: Aviva Signs, MD;  Location: [facility];  Service: General;  Laterality: Left;   TONSILLECTOMY     TRANSMETATARSAL AMPUTATION Left 07/25/2018   Procedure: LEFT GREAT TOE AMPUTATION;  Surgeon: Marty Heck, MD;  Location: [facility];  Service: Vascular;  Laterality: Left;   TRANSMETATARSAL AMPUTATION Left 07/30/2018   Procedure: TRANSMETATARSAL AMPUTATION;  Surgeon: Waynetta Sandy, MD;  Location: [facility];  Service: Vascular;  Laterality: Left;    SOCIAL HISTORY:  Social History   Socioeconomic History   Marital status: Married    Spouse name: Kitty   Number of children: 4   Years of education: Not on file   Highest education level: Not on file  Occupational History   Not on file  Tobacco Use   Smoking status: Current Every Day Smoker    Packs/day: 0.50    Types: Cigarettes   Smokeless tobacco: Former User    Types: Snuff    Quit date: 04/21/1989  Vaping Use   Vaping Use: Never used  Substance and Sexual Activity   Alcohol use: Yes    Alcohol/week: 0.0 standard drinks    Comment: stopped 2 years ago   Drug use: No   Sexual activity: Not Currently  Other Topics Concern   Not on file  Social History Narrative   Lives with wife [name]- married 65 years    4 children with wife. All grown- live close by       15 Cats and 5 Dogs      Enjoys: walking when hip lets him, camping, likes being outside       Diet: eats all food  groups    Caffeine: diet sodas 3    Water: 6-8 cups daily -gallon       Wears seat belt    Does not drive- daughter drove him 21 years ago lost license   No smoke detectors due to wood burning stove    Weapons- Sales executive          Social Determinants of Health   Financial Resource Strain: Medium Risk   Difficulty of Paying Living Expenses: Somewhat hard  Food Insecurity: No Food Insecurity   Worried About Charity fundraiser in the Last Year: Never true   Arboriculturist in the Last Year: Never true  Transportation Needs: No Transportation Needs   Lack of Transportation (Medical): No   Lack of Transportation (Non-Medical): No  Physical Activity: Inactive   Days of Exercise per Week: 0 days   Minutes of Exercise per Session: 0  min  Stress: No Stress Concern Present   Feeling of Stress : Not at all  Social Connections: Moderately Integrated   Frequency of Communication with Friends and Family: Twice a week   Frequency of Social Gatherings with Friends and Family: Twice a week   Attends Religious Services: 1 to 4 times per year   Active Member of [employer] or Organizations: No   Attends Music therapist: Never   Marital Status: Married  Human resources officer Violence: Not At Risk   Fear of Current or Ex-Partner: No   Emotionally Abused: No   Physically Abused: No   Sexually Abused: No    FAMILY HISTORY:  Family History  Problem Relation Age of Onset   Alcoholism Mother    Alcoholism Father    Cervical cancer Sister        dx. late 37s   Cervical cancer Sister        dx. late 51s    CURRENT MEDICATIONS:  Current Outpatient Medications  Medication Sig Dispense Refill   blood glucose meter kit and supplies KIT Dispense based on patient and insurance preference. Use up to four times daily as directed. (FOR ICD-9 250.00, 250.01). 1 each 0   dexamethasone (DECADRON) 10 MG/ML injection Inject 10 mg into the vein every 14 (fourteen) days.  Dexamethasone 54m in NS 50 ml IVPB prior to chemotherapy administration     fluorouracil CALGB 841740in sodium chloride 0.9 % 150 mL Inject 2,400 mg/m2 into the vein over 48 hr.     fosaprepitant (EMEND) 150 MG SOLR injection Inject 150 mg into the vein every 14 (fourteen) days. Prior to chemotherapy administration     HYDROcodone-acetaminophen (NORCO) 5-325 MG tablet Take 1 tablet by mouth every 4 (four) hours as needed for moderate pain. 30 tablet 0   IRINOTECAN HCL IV Inject 280 mg/m2 into the vein every 14 (fourteen) days.     LEUCOVORIN CALCIUM IV Inject 400 mg/m2 into the vein every 14 (fourteen) days.     lidocaine-prilocaine (EMLA) cream Apply a small amount to port a cath site and cover with plastic wrap 1 hour prior to chemotherapy appointments 30 g 3   loperamide (IMODIUM A-D) 2 MG tablet Take 2 at onset of diarrhea, then 1 every 2hrs until 12hr without a BM. May take 2 tab every 4hrs at bedtime. If diarrhea recurs repeat. 100 tablet 1   metFORMIN (GLUCOPHAGE) 500 MG tablet Take 2 tablets (1,000 mg total) by mouth 2 (two) times daily with a meal. 60 tablet 3   OXALIPLATIN IV Inject 85 mg/m2 into the vein every 14 (fourteen) days.     palonosetron (ALOXI) 0.25 MG/5ML injection Inject 0.25 mg into the vein every 14 (fourteen) days. Prior to chemotherapy administration     prochlorperazine (COMPAZINE) 10 MG tablet Take 1 tablet (10 mg total) by mouth every 6 (six) hours as needed (Nausea or vomiting). 30 tablet 1   No current facility-administered medications for this visit.    ALLERGIES:  Allergies  Allergen Reactions   Tea Itching and Rash    PHYSICAL EXAM:  Performance status (ECOG): 1 - Symptomatic but completely ambulatory  There were no vitals filed for this visit. Wt Readings from Last 3 Encounters:  05/25/20 145 lb 1.9 oz (65.8 kg)  05/24/20 147 lb 11.2 oz (67 kg)  05/13/20 148 lb (67.1 kg)   Physical Exam Vitals reviewed.  Constitutional:       Appearance: Normal appearance.  Chest:  Comments: Port-a-Cath in L chest Abdominal:     Palpations: Abdomen is soft.     Tenderness: There is abdominal tenderness in the epigastric area and periumbilical area.  Skin:    Coloration: Skin is jaundiced.  Neurological:     Mental Status: He is alert.     LABORATORY DATA:  I have reviewed the labs as listed.  CBC Latest Ref Rng & Units 05/03/2020 04/25/2020 04/24/2020  WBC 4.0 - 10.5 K/uL 9.2 10.2 9.3  Hemoglobin 13.0 - 17.0 g/dL 13.3 13.3 13.8  Hematocrit 39 - 52 % 41.0 41.0 43.2  Platelets 150 - 400 K/uL 291 217 257   CMP Latest Ref Rng & Units 04/25/2020 04/24/2020 08/06/2018  Glucose 70 - 99 mg/dL 258(H) 523(HH) 195(H)  BUN 8 - 23 mg/dL 8 10 6(L)  Creatinine 0.61 - 1.24 mg/dL 0.65 0.85 0.62  Sodium 135 - 145 mmol/L 137 133(L) 133(L)  Potassium 3.5 - 5.1 mmol/L 3.9 3.8 3.3(L)  Chloride 98 - 111 mmol/L 106 101 103  CO2 22 - 32 mmol/L 22 20(L) 24  Calcium 8.9 - 10.3 mg/dL 8.4(L) 8.7(L) 8.1(L)  Total Protein 6.5 - 8.1 g/dL - 7.0 6.3(L)  Total Bilirubin 0.3 - 1.2 mg/dL - 0.8 0.7  Alkaline Phos 38 - 126 U/L - 122 61  AST 15 - 41 U/L - 26 42(H)  ALT 0 - 44 U/L - 18 33    DIAGNOSTIC IMAGING:  I have independently reviewed the scans and discussed with the patient. NM PET Image Initial (PI) Skull Base To Thigh  Result Date: 05/19/2020 CLINICAL DATA:  Initial treatment strategy for pancreas adenocarcinoma. EXAM: NUCLEAR MEDICINE PET SKULL BASE TO THIGH TECHNIQUE: 6.9 mCi F-18 FDG was injected intravenously. Full-ring PET imaging was performed from the skull base to thigh after the radiotracer. CT data was obtained and used for attenuation correction and anatomic localization. Fasting blood glucose: 238 mg/dl COMPARISON:  CT [facility] 04/24/2020. FINDINGS: Mediastinal blood pool activity: SUV max 2.92 Liver activity: SUV max NA NECK: No hypermetabolic lymph nodes in the neck. Incidental CT findings: none CHEST: No FDG avid axillary, supraclavicular,  mediastinal, or hilar lymph nodes. Tiny perifissural nodule within the anterolateral right base measures 3 mm and is too small to reliably characterize. Tiny 4 mm subpleural nodule in the posterolateral right lower lobe is far too small to reliably characterize. No FDG avid pulmonary nodules identified bilaterally. Incidental CT findings: Moderate paraseptal and centrilobular emphysema. Aortic atherosclerosis. Coronary artery calcifications. ABDOMEN/PELVIS: Numerous low-attenuation lesions compatible with biopsy-proven adenocarcinoma liver metastasis are again noted. Most of these are too small to reliably characterize by PET-CT. Several foci of increased FDG uptake above background liver activity are noted within the left hepatic lobe with SUV max is between 4.69 and 4.84. Likely representing metastasis which are occult on the corresponding CT images. Mass within the head of pancreas is FDG avid within SUV max of 5.5. No abnormal FDG uptake within the spleen, adrenal glands. No significant FDG uptake identified within the abdominopelvic lymph nodes. Recently described enhancing lesion within the right kidney is suboptimally evaluated on today's exam. Incidental CT findings: Gallstones. Aortic atherosclerosis. No aneurysm. SKELETON: No focal hypermetabolic activity to suggest skeletal metastasis. Incidental CT findings: Thoracolumbar degenerative disc disease noted. IMPRESSION: 1. FDG avid head of pancreas mass compatible with primary pancreatic adenocarcinoma. 2. Multifocal low-attenuation lesions are scattered throughout both lobes of liver compatible with biopsy-proven liver metastases. Most of these are too small to reliably characterize by PET-CT. On the PET  images there are several foci of increased FDG uptake above background activity most likely representing foci of metastasis. 3. No FDG avid lymph nodes within the abdomen. No evidence for FDG avid metastasis to the chest or skeletal structures. 4. Two small  lung nodules are identified within the right lower lobe measuring less than 5 mm. These are far too small to reliably characterize by PET-CT. 5. Aortic Atherosclerosis (ICD10-I70.0) and Emphysema (ICD10-J43.9). 6. Coronary artery calcifications. Electronically Signed   By: Kerby Moors M.D.   On: 05/19/2020 11:17   DG Chest Port 1 View  Result Date: 05/21/2020 CLINICAL DATA:  LEFT Port-A-Cath placement, history of metastatic pancreatic carcinoma EXAM: PORTABLE CHEST 1 VIEW COMPARISON:  08/04/2018 FINDINGS: LEFT-sided Port-A-Cath placed since the previous study terminates at the distal aspect of the superior vena cava entering via subclavian approach. Cardiomediastinal contours and hilar structures are normal. No pneumothorax. No lobar consolidation.  No pleural effusion. On limited assessment visualized skeletal structures without acute process. IMPRESSION: LEFT-sided Port-A-Cath placed since the previous study. No pneumothorax. Electronically Signed   By: Zetta Bills M.D.   On: 05/21/2020 08:12   DG C-Arm 1-60 Min-No Report  Result Date: 05/21/2020 Fluoroscopy was utilized by the requesting physician.  No radiographic interpretation.     ASSESSMENT:  1. 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 -Current active smoker, 1 pack/day for 47 years. No history of alcohol use. No family history of malignancy. -CT [facility] with contrast on 04/24/2020 shows 2.2 cm pancreatic head mass, small hypodense lesions throughout the bilateral lower lobes, predominantly in the right lower lobe, largest measuring 1.1 cm. 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 Mildly prominent lymph nodes within the upper abdomen above the pancreas head suspicious for metastatic lymphadenopathy. 5 mm pleural-based right lower lobe nodule. -CA 19-9 is 12. -Liver biopsy on  05/03/2020 consistent with metastatic adenocarcinoma, positive for CK7, CK20, CDX2. Differential diagnosis includes primary upper GI versus pancreaticobiliary. -PET scan on 05/19/2020 shows FDG avid had a pancreatic mass, multifocal low-attenuation lesions scattered throughout both lobes of the liver compatible with biopsy-proven liver metastasis.  No FDG avid lymph nodes in the abdomen.  No evidence of metastasis in the chest or bones.  2 small lung nodules within the right lower lobe measuring less than 5 mm.  2. Poorly controlled diabetes: -Hemoglobin A1c was 10.8 during hospitalization.  3. Abdominal pain: -Highly likely malignancy related. -He is currently on hydrocodone 5/325 1 to 2 tablets every 4 hours as needed. Patient reports that he still has pain but manageable.  4. Right kidney mass: -No further work-up needed if biopsy of liver lesion confirms pancreatic adenocarcinoma.   PLAN:  1.  Metastatic pancreatic cancer to the liver: -He is here today for initiation of palliative chemotherapy with the FOLFIRINOX regimen. -He started developing abdominal pain in the last couple of days, more severe than previously. -Reported decrease in appetite. -Lab showed total bilirubin 17.9 with direct bilirubin of 12.6.  AST and ALT are also elevated. -We will hold his chemotherapy today.  He will receive fluids. -We have done MRCP which showed pancreatic head masses obstructing the CBD resulting in extrahepatic biliary ductal dilatation.  There is also progressive disease with enlargement of primary lesion in the head of the pancreas and increased number and size of numerous metastatic lesions throughout the liver. -I have reached out to  Dr. Laural Golden who has kindly agreed to review MRI and will plan for ERCP with stent placement either tomorrow or Friday. -We will see him back in 1 week for follow-up of labs.  He will initiate chemotherapy as soon as his bilirubin is close to normal.  2.  Poorly controlled diabetes: -Continue Metformin 1000 mg twice daily.  3. Abdominal pain: -Continue hydrocodone 5/325 as needed.  4. Right kidney mass: -Right kidney lesion is suboptimally evaluated on the PET scan.  MRCP showed 1.4 cm lesion in the lateral aspect of the interpolar region of the right kidney suspicious for primary RCC.  No active intervention necessary.  5. Genetic testing: -He has blood drawn today for genetic testing.   Orders placed this encounter:  No orders of the defined types were placed in this encounter.    Derek Jack, MD [place] [phone]   I, Milinda Antis, am acting as a scribe for Dr. Sanda Linger.  I, Derek Jack MD, have reviewed the above documentation for accuracy and completeness, and I agree with the above.

## 2020-06-10 ENCOUNTER — Encounter (HOSPITAL_COMMUNITY): Payer: Self-pay

## 2020-06-10 ENCOUNTER — Other Ambulatory Visit (HOSPITAL_COMMUNITY)
Admission: RE | Admit: 2020-06-10 | Discharge: 2020-06-10 | Disposition: A | Discharge status: Home / Self Care | Payer: Medicare Other | Source: Ambulatory Visit | Attending: Internal Medicine | Admitting: Internal Medicine

## 2020-06-10 ENCOUNTER — Other Ambulatory Visit: Payer: Self-pay

## 2020-06-10 ENCOUNTER — Encounter (HOSPITAL_COMMUNITY)
Admission: RE | Admit: 2020-06-10 | Discharge: 2020-06-10 | Disposition: A | Discharge status: Home / Self Care | Payer: Medicare Other | Source: Ambulatory Visit | Attending: Internal Medicine | Admitting: Internal Medicine

## 2020-06-10 DIAGNOSIS — Z20822 Contact with and (suspected) exposure to covid-19: Secondary | ICD-10-CM | POA: Diagnosis not present

## 2020-06-10 DIAGNOSIS — Z01812 Encounter for preprocedural laboratory examination: Secondary | ICD-10-CM | POA: Diagnosis present

## 2020-06-10 NOTE — Patient Instructions (Signed)
John Schadt Sr.  06/10/2020     @PREFPERIOPPHARMACY @   Your procedure is scheduled on 06/11/2020.  Report to [name] at 11:30 A.M.  Call this number if you have problems the morning of surgery:  [phone]   Remember:  Do not eat or drink after midnight.  Do not take any diabetic medications the morning of the procedure.    Take these medicines the morning of surgery with A SIP OF WATER : NONE    Do not wear jewelry, make-up or nail polish.  Do not wear lotions, powders, or perfumes, or deodorant.  Do not shave 48 hours prior to surgery.  Men may shave face and neck.  Do not bring valuables to the hospital.  [facility] is not responsible for any belongings or valuables.  Contacts, dentures or bridgework may not be worn into surgery.  Leave your suitcase in the car.  After surgery it may be brought to your room.  For patients admitted to the hospital, discharge time will be determined by your treatment team.  Patients discharged the day of surgery will not be allowed to drive home.   Name and phone number of your driver:   FAMILY Special instructions:  N/A  Please read over the following fact sheets that you were given. Care and Recovery After Surgery  Endoscopic Retrograde Cholangiopancreatogram  Endoscopic retrograde cholangiopancreatogram (ERCP) is a procedure that may be used to diagnose or treat problems with the pancreas, bile ducts, liver, and gallbladder. For this procedure, a thin, lighted tube (endoscope) is passed through the mouth, the throat, and down into the areas being checked. The endoscope has a camera that allows the areas to be viewed. Dye is injected and then X-rays are taken to further study the areas. During ERCP, other procedures may also be done to help diagnose or treat problems that are found. For example, stones can be removed, or a tissue sample can be taken out for testing (biopsy). Tell a health care provider about:  Any allergies you  have.  All medicines you are taking, including vitamins, herbs, eye drops, creams, and over-the-counter medicines.  Any problems you or family members have had with anesthetic medicines.  Any blood disorders you have.  Any surgeries you have had.  Any medical conditions you have.  Whether you are pregnant or may be pregnant. What are the risks? Generally, this is a safe procedure. However, problems may occur, including:  Pancreatitis.  Infection.  Bleeding.  Allergic reactions to medicines or dyes.  Accidental punctures in the bowel wall, pancreas, or gallbladder.  Damage to other structures or organs. What happens before the procedure? Staying hydrated Follow instructions from your health care provider about hydration, which may include:  Up to 2 hours before the procedure - you may continue to drink clear liquids, such as water, clear fruit juice, black coffee, and plain tea. Eating and drinking restrictions Follow instructions from your health care provider about eating and drinking, which may include:  8 hours before the procedure - stop eating heavy meals or foods such as meat, fried foods, or fatty foods.  6 hours before the procedure - stop eating light meals or foods, such as toast or cereal.  6 hours before the procedure - stop drinking milk or drinks that contain milk.  2 hours before the procedure - stop drinking clear liquids. General instructions  Ask your health care provider about: ? Changing or stopping your regular medicines. This is especially important if  you are taking diabetes medicines or blood thinners. ? Taking medicines such as aspirin and ibuprofen. These medicines can thin your blood. Do not take these medicines before your procedure if your health care provider instructs you not to.  Plan to have someone take you home from the hospital or clinic.  If you will be going home right after the procedure, plan to have someone with you for 24  hours. What happens during the procedure?  To lower your risk of infection, your health care team will wash or sanitize their hands.  An IV tube will be inserted into one of your veins.  You will be given one or more of the following: ? A medicine to help you relax (sedative). ? A medicine to numb the throat area (local anesthetic) and prevent gagging. Your throat may be sprayed with this medicine, or you may gargle the medicine. ? A medicine to make you fall asleep (general anesthetic). ? A medicine to lower your risk of infection (antibiotic), inflammation (anti-inflammatory), or both.  You will lie on your left side.  The endoscope will be inserted through your mouth, down the back of the throat, and into the first part of the small intestine (duodenum).  Then a small, plastic tube (cannula) will be passed through the endoscope and directed into the bile duct or pancreatic duct.  Dye will be injected through the cannula to make structures easier to see on an X-ray.  X-rays will be taken to study the biliary and pancreatic passageways. You may be positioned on your abdomen or your back during the X-rays.  A small sample of tissue (biopsy) may be removed for examination, or other procedures may be done to fix problems that are found. The procedure may vary among health care providers and hospitals. What happens after the procedure?  Your blood pressure, heart rate, breathing rate, and blood oxygen level will be monitored until the medicines you were given have worn off.  Your throat may feel slightly sore.  You will not be allowed to eat or drink until numbness subsides.  Do not drive for 24 hours if you were given a sedative. Summary  Endoscopic retrograde cholangiopancreatogram is a procedure that may be used to diagnose or treat problems with the pancreas, bile ducts, liver, and gallbladder.  During ERCP, other procedures may also be done to help diagnose or treat problems  that are found. For example, stones can be removed, or a tissue sample can be taken out for testing (biopsy).  Generally, this is a safe procedure. However, problems may occur, including infection, bleeding, pancreatitis, accidental damage to other structures or organs, and allergic reactions to medicines or dyes.  The procedure may vary among health care providers and hospitals. This information is not intended to replace advice given to you by your health care provider. Make sure you discuss any questions you have with your health care provider. Document Revised: 08/24/2017 Document Reviewed: 08/07/2016 Elsevier Patient Education  2020 [employer].

## 2020-06-10 NOTE — Progress Notes (Signed)
Called patient left message on machine at home. I asked patient if he could come in sometime before 4:00 today since his procedure is 06/11/2020. I called his cell phone and got a recording: my call could not be completed to hang up and call again later. The second time I called his cell phone I got a busy signal. I'll try again later if I don't hear form him. I tried calling his daughter and I got a busy signal. I'm just trying to see if I have the right cell phone # for the patient. Called pt.'s cell # and got the same recording. Called daughter's # and still got a busy signal. I tried his house # again, spoke to pt., he said he'd call a taxi and he should be here by 3:45 or 4:00. Hopefully he'll make it. I'm here till 4:30.  Pt. Came in, nothing further needed.

## 2020-06-11 ENCOUNTER — Encounter (HOSPITAL_COMMUNITY): Payer: Medicare Other

## 2020-06-11 ENCOUNTER — Encounter (HOSPITAL_COMMUNITY): Payer: Self-pay | Admitting: General Practice

## 2020-06-11 ENCOUNTER — Ambulatory Visit (HOSPITAL_COMMUNITY): Payer: Medicare Other

## 2020-06-11 ENCOUNTER — Encounter (HOSPITAL_COMMUNITY): Payer: Self-pay | Admitting: Internal Medicine

## 2020-06-11 ENCOUNTER — Ambulatory Visit (HOSPITAL_COMMUNITY): Payer: Medicare Other | Admitting: Anesthesiology

## 2020-06-11 ENCOUNTER — Encounter (HOSPITAL_COMMUNITY)
Admission: RE | Disposition: A | Discharge status: Home / Self Care | Payer: Self-pay | Source: Home / Self Care | Attending: Internal Medicine

## 2020-06-11 ENCOUNTER — Ambulatory Visit (HOSPITAL_COMMUNITY)
Admission: RE | Admit: 2020-06-11 | Discharge: 2020-06-11 | Disposition: A | Discharge status: Home / Self Care | Payer: Medicare Other | Attending: Internal Medicine | Admitting: Internal Medicine

## 2020-06-11 DIAGNOSIS — C250 Malignant neoplasm of head of pancreas: Secondary | ICD-10-CM

## 2020-06-11 DIAGNOSIS — I10 Essential (primary) hypertension: Secondary | ICD-10-CM | POA: Diagnosis not present

## 2020-06-11 DIAGNOSIS — M1990 Unspecified osteoarthritis, unspecified site: Secondary | ICD-10-CM | POA: Diagnosis not present

## 2020-06-11 DIAGNOSIS — E1151 Type 2 diabetes mellitus with diabetic peripheral angiopathy without gangrene: Secondary | ICD-10-CM | POA: Insufficient documentation

## 2020-06-11 DIAGNOSIS — Z7984 Long term (current) use of oral hypoglycemic drugs: Secondary | ICD-10-CM | POA: Insufficient documentation

## 2020-06-11 DIAGNOSIS — K838 Other specified diseases of biliary tract: Secondary | ICD-10-CM

## 2020-06-11 DIAGNOSIS — Z89512 Acquired absence of left leg below knee: Secondary | ICD-10-CM | POA: Insufficient documentation

## 2020-06-11 DIAGNOSIS — K831 Obstruction of bile duct: Secondary | ICD-10-CM | POA: Insufficient documentation

## 2020-06-11 DIAGNOSIS — Z7982 Long term (current) use of aspirin: Secondary | ICD-10-CM | POA: Insufficient documentation

## 2020-06-11 DIAGNOSIS — C787 Secondary malignant neoplasm of liver and intrahepatic bile duct: Secondary | ICD-10-CM | POA: Diagnosis not present

## 2020-06-11 DIAGNOSIS — F17210 Nicotine dependence, cigarettes, uncomplicated: Secondary | ICD-10-CM | POA: Insufficient documentation

## 2020-06-11 DIAGNOSIS — R17 Unspecified jaundice: Secondary | ICD-10-CM

## 2020-06-11 HISTORY — PX: BILIARY STENT PLACEMENT: SHX5538

## 2020-06-11 HISTORY — PX: SPHINCTEROTOMY: SHX5279

## 2020-06-11 HISTORY — PX: ERCP: SHX5425

## 2020-06-11 LAB — GLUCOSE, CAPILLARY
Glucose-Capillary: 289 mg/dL — ABNORMAL HIGH (ref 70–99)
Glucose-Capillary: 370 mg/dL — ABNORMAL HIGH (ref 70–99)
Glucose-Capillary: 227 mg/dL — ABNORMAL HIGH (ref 70–99)

## 2020-06-11 LAB — SARS CORONAVIRUS 2 (TAT 6-24 HRS): SARS Coronavirus 2: NEGATIVE

## 2020-06-11 SURGERY — ERCP, WITH INTERVENTION IF INDICATED
Anesthesia: General

## 2020-06-11 MED ORDER — SUGAMMADEX SODIUM 200 MG/2ML IV SOLN
INTRAVENOUS | Status: DC | PRN
  Administered 2020-06-11: 200 mg via INTRAVENOUS

## 2020-06-11 MED ORDER — CEFAZOLIN SODIUM-DEXTROSE 2-3 GM-%(50ML) IV SOLR
INTRAVENOUS | Status: DC | PRN
  Administered 2020-06-11: 2 g via INTRAVENOUS

## 2020-06-11 MED ORDER — PROPOFOL 10 MG/ML IV BOLUS
INTRAVENOUS | Status: DC | PRN
  Administered 2020-06-11: 100 mg via INTRAVENOUS

## 2020-06-11 MED ORDER — HYDROMORPHONE HCL 1 MG/ML IJ SOLN
0.2500 mg | INTRAMUSCULAR | Status: DC | PRN
  Administered 2020-06-11: 0.5 mg via INTRAVENOUS
  Filled 2020-06-11: qty 0.5

## 2020-06-11 MED ORDER — LIDOCAINE HCL (CARDIAC) PF 100 MG/5ML IV SOSY
PREFILLED_SYRINGE | INTRAVENOUS | Status: DC | PRN
  Administered 2020-06-11: 60 mg via INTRAVENOUS

## 2020-06-11 MED ORDER — ONDANSETRON HCL 4 MG/2ML IJ SOLN
INTRAMUSCULAR | Status: DC | PRN
  Administered 2020-06-11: 4 mg via INTRAVENOUS

## 2020-06-11 MED ORDER — OXYCODONE HCL 5 MG PO TABS: 5.0000 mg | ORAL_TABLET | Freq: Four times a day (QID) | ORAL | 0 refills | Status: DC | PRN

## 2020-06-11 MED ORDER — SODIUM CHLORIDE 0.9 % IV SOLN: INTRAVENOUS | Status: DC | PRN

## 2020-06-11 MED ORDER — LACTATED RINGERS IV SOLN: INTRAVENOUS | Status: DC | PRN

## 2020-06-11 MED ORDER — CEFAZOLIN SODIUM-DEXTROSE 2-4 GM/100ML-% IV SOLN
INTRAVENOUS | Status: AC
  Filled 2020-06-11: qty 100

## 2020-06-11 MED ORDER — FENTANYL CITRATE (PF) 250 MCG/5ML IJ SOLN
INTRAMUSCULAR | Status: AC
  Filled 2020-06-11: qty 5

## 2020-06-11 MED ORDER — WATER FOR IRRIGATION, STERILE IR SOLN
Status: DC | PRN
  Administered 2020-06-11: 1000 mL

## 2020-06-11 MED ORDER — INSULIN ASPART 100 UNIT/ML ~~LOC~~ SOLN
8.0000 [IU] | Freq: Once | SUBCUTANEOUS | Status: AC
  Administered 2020-06-11: 8 [IU] via SUBCUTANEOUS
  Filled 2020-06-11: qty 0.08

## 2020-06-11 MED ORDER — PHENYLEPHRINE 40 MCG/ML (10ML) SYRINGE FOR IV PUSH (FOR BLOOD PRESSURE SUPPORT)
PREFILLED_SYRINGE | INTRAVENOUS | Status: DC | PRN
  Administered 2020-06-11: 40 ug via INTRAVENOUS

## 2020-06-11 MED ORDER — ROCURONIUM BROMIDE 10 MG/ML (PF) SYRINGE
PREFILLED_SYRINGE | INTRAVENOUS | Status: AC
  Filled 2020-06-11: qty 10

## 2020-06-11 MED ORDER — GLUCAGON HCL RDNA (DIAGNOSTIC) 1 MG IJ SOLR
INTRAMUSCULAR | Status: AC
  Filled 2020-06-11: qty 2

## 2020-06-11 MED ORDER — ONDANSETRON HCL 4 MG/2ML IJ SOLN: 4.0000 mg | Freq: Once | INTRAMUSCULAR | Status: DC | PRN

## 2020-06-11 MED ORDER — MEPERIDINE HCL 50 MG/ML IJ SOLN: 6.2500 mg | INTRAMUSCULAR | Status: DC | PRN

## 2020-06-11 MED ORDER — FENTANYL CITRATE (PF) 100 MCG/2ML IJ SOLN
INTRAMUSCULAR | Status: DC | PRN
Start: 2020-06-11 — End: 2020-06-11
  Administered 2020-06-11: 50 ug via INTRAVENOUS
  Administered 2020-06-11: 100 ug via INTRAVENOUS

## 2020-06-11 MED ORDER — LIDOCAINE 2% (20 MG/ML) 5 ML SYRINGE
INTRAMUSCULAR | Status: AC
  Filled 2020-06-11: qty 5

## 2020-06-11 MED ORDER — LACTATED RINGERS IV SOLN
Freq: Once | INTRAVENOUS | Status: AC
  Administered 2020-06-11: 1000 mL via INTRAVENOUS

## 2020-06-11 MED ORDER — SODIUM CHLORIDE 0.9 % IV SOLN
INTRAVENOUS | Status: AC
  Filled 2020-06-11: qty 50

## 2020-06-11 MED ORDER — ROCURONIUM BROMIDE 100 MG/10ML IV SOLN
INTRAVENOUS | Status: DC | PRN
  Administered 2020-06-11: 30 mg via INTRAVENOUS
  Administered 2020-06-11: 10 mg via INTRAVENOUS

## 2020-06-11 MED ORDER — GLUCAGON HCL RDNA (DIAGNOSTIC) 1 MG IJ SOLR
INTRAMUSCULAR | Status: DC | PRN
  Administered 2020-06-11 (×4): .25 mg via INTRAVENOUS

## 2020-06-11 MED ORDER — SUCCINYLCHOLINE CHLORIDE 20 MG/ML IJ SOLN
INTRAMUSCULAR | Status: DC | PRN
  Administered 2020-06-11: 100 mg via INTRAVENOUS

## 2020-06-11 NOTE — Discharge Instructions (Signed)
No aspirin for 3 days. Resume usual medications as before. Oxycodone 5 mg every 6 hours as needed for pain. Clear liquids today and resume usual diet starting tomorrow morning. No driving for 24 hours. Follow-up in oncology clinic next week.    Endoscopic Retrograde Cholangiopancreatogram, Care After This sheet gives you information about how to care for yourself after your procedure. Your health care provider may also give you more specific instructions. If you have problems or questions, contact your health care provider. What can I expect after the procedure? After the procedure, it is common to have:  Soreness in your throat.  Nausea.  Bloating.  Dizziness.  Tiredness (fatigue). Follow these instructions at home:   Take over-the-counter and prescription medicines only as told by your health care provider.  Do not drive for 24 hours if you were given a medicine to help you relax (sedative) during your procedure. Have someone stay with you for 24 hours after the procedure.  Return to your normal activities as told by your health care provider. Ask your health care provider what activities are safe for you.  Return to eating what you normally do as soon as you feel well enough or as told by your health care provider.  Keep all follow-up visits as told by your health care provider. This is important. Contact a health care provider if:  You have pain in your abdomen that does not get better with medicine.  You develop signs of infection, such as: ? Chills. ? Feeling unwell. Get help right away if:  You have difficulty swallowing.  You have worsening pain in your throat, chest, or abdomen.  You vomit bright red blood or a substance that looks like coffee grounds.  You have bloody or very black stools.  You have a fever.  You have a sudden increase in swelling (bloating) in your abdomen. Summary  After the procedure, it is common to feel tired and to have some  discomfort in your throat.  Contact your health care provider if you have signs of infection--such as chills or feeling unwell--or if you have pain that does not improve with medicine.  Get help right away if you have trouble swallowing, worsening pain, bloody or black vomit, bloody or black stools, a fever, or increased swelling in your abdomen.  Keep all follow-up visits as told by your health care provider. This is important. This information is not intended to replace advice given to you by your health care provider. Make sure you discuss any questions you have with your health care provider. Document Revised: 08/24/2017 Document Reviewed: 07/31/2016 Elsevier Patient Education  2020 [employer].

## 2020-06-11 NOTE — Progress Notes (Signed)
Brief ERCP note.  Marked edema to post bulbar duodenal mucosa with redundant folds making it difficult to locate ampulla of Vater. Q scope used to locate location of ampulla of VATEr. Pancreatic duct initially cannulated with Hydra Jagwire and partially felt to identify the landmarks. CBD finally cannulated leaving wire in the pancreatic duct. 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 Biliary system proximal to obstruction markedly dilated. Short biliary sphincterotomy performed. 60 mm x 10 mm fully wall covered stent placed for biliary decompression. As the stent was deployed free flow of dark bile noted into the duodenum. Patient tolerated the procedure well.  REF [id number]

## 2020-06-11 NOTE — Anesthesia Procedure Notes (Signed)
Procedure Name: Intubation Date/Time: 06/11/2020 2:11 PM Performed by: Hewitt Blade, CRNA Pre-anesthesia Checklist: Patient identified, Emergency Drugs available, Suction available and Patient being monitored Patient Re-evaluated:Patient Re-evaluated prior to induction Oxygen Delivery Method: Circle system utilized Preoxygenation: Pre-oxygenation with 100% oxygen Induction Type: IV induction Ventilation: Mask ventilation without difficulty Laryngoscope Size: Mac and 3 Grade View: Grade II Tube type: Oral Tube size: 7.5 mm Number of attempts: 1 Airway Equipment and Method: Stylet and Oral airway Placement Confirmation: ETT inserted through vocal cords under direct vision,  positive ETCO2 and breath sounds checked- equal and bilateral Secured at: 22 cm Tube secured with: Tape Dental Injury: Teeth and Oropharynx as per pre-operative assessment

## 2020-06-11 NOTE — Progress Notes (Signed)
[facility] CSW Progress Notes  Communication from [facility] - they have paid part of his power bill and he is on their food distribution list.  Edwyna Shell, [place] Worker Phone:  [phone]

## 2020-06-11 NOTE — Op Note (Signed)
[facility] Patient Name: [name] Procedure Date: 06/11/2020 3:45 PM MRN: [id number] Date of Birth: 02-Dec-1950 Attending MD: Hildred Laser , MD CSN: [id number] Age: 69 Admit Type: Ambulatory Procedure:                ERCP Indications:              Jaundice, Malignant tumor of the head of pancreas Providers:                Hildred Laser, MD, Otis Peak B. Sharon Seller, RN, Caprice Kluver, Casimer Bilis, Technician, Aram Candela Referring MD:             Derek Jack, MD Medicines:                General Anesthesia Complications:            No immediate complications. Estimated Blood Loss:     Estimated blood loss: none. Procedure:                Pre-Anesthesia Assessment:                           - Prior to the procedure, a History and Physical                            was performed, and patient medications and                            allergies were reviewed. The patient's tolerance of                            previous anesthesia was also reviewed. The risks                            and benefits of the procedure and the sedation                            options and risks were discussed with the patient.                            All questions were answered, and informed consent                            was obtained. Prior Anticoagulants: The patient has                            taken no previous anticoagulant or antiplatelet                            agents except for aspirin. ASA Grade Assessment:                            III - A patient with severe systemic disease. After  reviewing the risks and benefits, the patient was                            deemed in satisfactory condition to undergo the                            procedure.                           After obtaining informed consent, the scope was                            passed under direct vision. Throughout the                             procedure, the patient's blood pressure, pulse, and                            oxygen saturations were monitored continuously. The                            [id number]-[id number] ([id number]) scope was introduced through                            the and used to inject contrast into and used to                            inject contrast into the bile duct. The ERCP was                            technically difficult and complex. The patient                            tolerated the procedure well. Findings:      The scout film was normal. The scope was advanced to a normal major       papilla in the descending duodenum. Examination of the pharynx, larynx       and associated structures, and upper GI tract was normal. Major papilla       was hidden behind swollen folds and located with Q-scope. The ventral       pancreatic duct alone was deeply cannulated with the Hydratome       sphincterotome. Contrast was injected to confirm location. Guidewire       left in order to facilitate cannulation of bile duct.. I personally       interpreted the pancreatic duct images. The bile duct was deeply       cannulated with the Hydratome sphincterotome. Contrast was injected. The       lower third of the main bile duct was completely obstructed by what       appeared to be a mass. The entire biliary tree was moderately dilated       and diffusely dilated, with a mass causing an obstruction. A 5 mm       biliary sphincterotomy was made with a braided Hydratome sphincterotome       using ERBE electrocautery. There was  no post-sphincterotomy bleeding.       One 10 Fr by 6 cm covered metal stent was placed 5.5 cm into the common       bile duct. Bile flowed through the stent. The stent was in good position. Impression:               - Major papilla difficult to see because of mucosal                            edema.                           - A biliary tract obstruction secondary to what                             appeared to be a mass was found in the lower third                            of the main duct.                           - The entire biliary tree was moderately dilated,                            with a mass causing an obstruction.                           - A biliary sphincterotomy was performed.                           - One covered metal stent was placed into the                            common bile duct. Moderate Sedation:      Per Anesthesia Care Recommendation:           - Discharge patient to home (with escort).                           - Avoid aspirin for 3 days.                           - Clear liquid diet today. Then usual diet.                           - Oxycodone 5 mg po q 6 hours prn.                           - Follow-up with Dr. Delton Coombes next week. Procedure Code(s):        --- Professional ---                           [phone], Endoscopic retrograde                            cholangiopancreatography (ERCP); with placement of  endoscopic stent into biliary or pancreatic duct,                            including pre- and post-dilation and guide wire                            passage, when performed, including sphincterotomy,                            when performed, each stent Diagnosis Code(s):        --- Professional ---                           K83.1, Obstruction of bile duct                           R17, Unspecified jaundice                           C25.0, Malignant neoplasm of head of pancreas                           K83.8, Other specified diseases of biliary tract CPT copyright 2019 [employer]. All rights reserved. The codes documented in this report are preliminary and upon coder review may  be revised to meet current compliance requirements. Hildred Laser, MD Hildred Laser, MD 06/11/2020 4:39:48 PM This report has been signed electronically. Number of Addenda: 0

## 2020-06-11 NOTE — H&P (Signed)
John Sudbury Sr. is an 69 y.o. male.   Chief Complaint: Patient is here for ERCP with biliary stenting. HPI: Patient is an unfortunate 69 year old male who was diagnosed with metastatic pancreatic carcinoma few weeks ago.  He had Port-A-Cath placed.  He was seen at oncology clinic earlier this week to initiate chemotherapy.  Patient was noted to be jaundiced.  His bilirubin was 17.8.  He had MRCP which revealed diffuse metastatic disease dilated biliary system to the level of common bile duct.  He also had a distended gallbladder. Patient complains of pain across upper abdomen which he has had for several weeks.  He states he has finished up and pain medications.  He denies pruritus fever or chills.  He noted his urine to be yellow 3 days ago.  He was not aware that he was jaundiced.  He states he has lost 15 pounds this year.  He is smoke cigarettes.  He smokes about a pack a day.  He quit drinking alcohol 3 years ago.  He states he used to 6 drinks; 6 x 40 ounces drinks of beer every day.   Past Medical History:  Diagnosis Date  . Acute blood loss anemia   . Arthritis   . Benign essential HTN   . Cellulitis 07/20/2018  . Cellulitis in diabetic foot ([place]) 07/20/2018  . Diabetes mellitus without complication ([place])   . Diabetes mellitus, type II ([place]) 07/20/2018  . Dry gangrene ([place]) 07/20/2018  . Emesis   . Family history of cervical cancer   . Heart murmur   . Hyperglycemia   . Hypertension   . Joint pain   . Port-A-Cath in place 05/25/2020  . Unilateral complete BKA, left, initial encounter [facility])     Past Surgical History:  Procedure Laterality Date  . AMPUTATION Left 08/02/2018   Procedure: LEFT AMPUTATION BELOW KNEE;  Surgeon: Marty Heck, MD;  Location: [place];  Service: Vascular;  Laterality: Left;  . ELBOW SURGERY     Bone graft to repair elbow  . LOWER EXTREMITY ANGIOGRAPHY N/A 07/24/2018   Procedure: LOWER EXTREMITY ANGIOGRAPHY;  Surgeon: Marty Heck, MD;   Location: [place] [facility];  Service: Cardiovascular;  Laterality: N/A;  . PERIPHERAL VASCULAR INTERVENTION  07/24/2018   Procedure: PERIPHERAL VASCULAR INTERVENTION;  Surgeon: Marty Heck, MD;  Location: [place] [facility];  Service: Cardiovascular;;  . PORTACATH PLACEMENT Left 05/21/2020   Procedure: INSERTION PORT-A-CATH;  Surgeon: Aviva Signs, MD;  Location: [facility];  Service: General;  Laterality: Left;  . TONSILLECTOMY    . TRANSMETATARSAL AMPUTATION Left 07/25/2018   Procedure: LEFT GREAT TOE AMPUTATION;  Surgeon: Marty Heck, MD;  Location: [place];  Service: Vascular;  Laterality: Left;  . TRANSMETATARSAL AMPUTATION Left 07/30/2018   Procedure: TRANSMETATARSAL AMPUTATION;  Surgeon: Waynetta Sandy, MD;  Location: [facility];  Service: Vascular;  Laterality: Left;    Family History  Problem Relation Age of Onset  . Alcoholism Mother   . Alcoholism Father   . Cervical cancer Sister        dx. late 75s  . Cervical cancer Sister        dx. late 92s   Social History:  reports that he has been smoking cigarettes. He has been smoking about 0.50 packs per day. He quit smokeless tobacco use about 31 years ago.  His smokeless tobacco use included snuff. He reports previous alcohol use. He reports that he does not use drugs.  Allergies:  Allergies  Allergen Reactions  . Tea Itching and Rash    Medications Prior to Admission  Medication Sig Dispense Refill  . acetaminophen (TYLENOL) 500 MG tablet Take 500-1,000 mg by mouth every 6 (six) hours as needed (for pain).    Marland Kitchen aspirin EC 81 MG tablet Take 81 mg by mouth daily. Swallow whole.    Marland Kitchen HYDROcodone-acetaminophen (NORCO) 5-325 MG tablet Take 1 tablet by mouth every 4 (four) hours as needed for moderate pain. (Patient taking differently: Take 1 tablet by mouth every 6 (six) hours as needed for moderate pain. ) 30 tablet 0  . metFORMIN (GLUCOPHAGE) 500 MG tablet Take 2 tablets (1,000 mg total) by mouth 2 (two)  times daily with a meal. (Patient taking differently: Take 500 mg by mouth in the morning and at bedtime. ) 60 tablet 3  . bisacodyl (DULCOLAX) 5 MG EC tablet Take 5 mg by mouth daily as needed for moderate constipation.    . blood glucose meter kit and supplies KIT Dispense based on patient and insurance preference. Use up to four times daily as directed. (FOR ICD-9 250.00, 250.01). 1 each 0  . dexamethasone (DECADRON) 10 MG/ML injection Inject 10 mg into the vein every 14 (fourteen) days. Dexamethasone 70m in NS 50 ml IVPB prior to chemotherapy administration    . fluorouracil CALGB 862703in sodium chloride 0.9 % 150 mL Inject 2,400 mg/m2 into the vein over 48 hr.    . fosaprepitant (EMEND) 150 MG SOLR injection Inject 150 mg into the vein every 14 (fourteen) days. Prior to chemotherapy administration    . IRINOTECAN HCL IV Inject 280 mg/m2 into the vein every 14 (fourteen) days.    .Marland KitchenLEUCOVORIN CALCIUM IV Inject 400 mg/m2 into the vein every 14 (fourteen) days.    .Marland Kitchenlidocaine-prilocaine (EMLA) cream Apply a small amount to port a cath site and cover with plastic wrap 1 hour prior to chemotherapy appointments 30 g 3  . loperamide (IMODIUM A-D) 2 MG tablet Take 2 at onset of diarrhea, then 1 every 2hrs until 12hr without a BM. May take 2 tab every 4hrs at bedtime. If diarrhea recurs repeat. (Patient not taking: Reported on 06/09/2020) 100 tablet 1  . OXALIPLATIN IV Inject 85 mg/m2 into the vein every 14 (fourteen) days.    . palonosetron (ALOXI) 0.25 MG/5ML injection Inject 0.25 mg into the vein every 14 (fourteen) days. Prior to chemotherapy administration    . prochlorperazine (COMPAZINE) 10 MG tablet Take 1 tablet (10 mg total) by mouth every 6 (six) hours as needed (Nausea or vomiting). (Patient not taking: Reported on 06/09/2020) 30 tablet 1    Results for orders placed or performed during the hospital encounter of 06/11/20 (from the past 48 hour(s))  Glucose, capillary     Status: Abnormal    Collection Time: 06/11/20 12:51 PM  Result Value Ref Range   Glucose-Capillary 289 (H) 70 - 99 mg/dL    Comment: Glucose reference range applies only to samples taken after fasting for at least 8 hours.   MR 3D Recon At Scanner  Result Date: 06/09/2020 CLINICAL DATA:  69year old male with history of pancreatic cancer presenting with jaundice and weakness. EXAM: MRI ABDOMEN WITHOUT AND WITH CONTRAST (INCLUDING MRCP) TECHNIQUE: Multiplanar multisequence MR imaging of the abdomen was performed both before and after the administration of intravenous contrast. Heavily T2-weighted images of the biliary and pancreatic ducts were obtained, and three-dimensional MRCP images were rendered by post processing. CONTRAST:  77mGADAVIST GADOBUTROL 1 MMOL/ML  IV SOLN COMPARISON:  No prior abdominal MRI. CT the abdomen and pelvis 04/24/2020. FINDINGS: Lower chest: Unremarkable. Hepatobiliary: Innumerable T1 hypointense, T2 hyperintense, diffusion restricting centrally hypovascular peripherally enhancing lesions are scattered throughout all aspects of the hepatic parenchyma, increased in number and size compared to the prior CT examination, compatible with widespread metastatic disease. Among the largest of these lesions is a lesion in segment 8 of the liver (axial image 15 of series 21) measuring 2.2 x 1.5 cm. No intrahepatic biliary ductal dilatation. Common bile duct is dilated measuring 1.4 cm in the porta hepatis, obstructed by the large pancreatic mass (discussed below). Gallbladder is dilated with multiple small filling defects within the gallbladder compatible with gallstones. Gallbladder wall does not appear thickened and there is no definite pericholecystic fluid or inflammatory changes to clearly indicate an associated cholecystitis at this time. Pancreas: Large mass in the head of the pancreas which is slightly low T1 signal intensity, slightly high T2 signal intensity and is generally hypovascular, estimated to  measure approximately 2.4 x 2.1 x 3.1 cm (axial image 51 of series 23 and coronal image 26 of series 25). This appears to occlude the common bile duct. At this time, the main pancreatic duct is ectatic but not dilated measuring up to 3 mm in the body of the pancreas. Mild increased T2 signal intensity surrounding the pancreas, which could reflect mild pancreatitis. No well-defined peripancreatic fluid collection to suggest pseudocyst at this time. Spleen:  Unremarkable. Adrenals/Urinary Tract: In the lateral aspect of the interpolar region of the right kidney (axial image 20 of series 4) there is a 1.4 cm lesion which is generally T1 hypointense (with mild heterogeneity in signal intensity), generally T2 hyperintense, with some internal T2 hypointensity, which corresponds to areas of internal nodular enhancement on post gadolinium images, concerning for a small renal neoplasm. This is encapsulated within Gerota's fascia and is well separated from the right renal vein which is widely patent. Left kidney and bilateral adrenal glands are normal in appearance. No hydroureteronephrosis in the visualized portions of the abdomen. Stomach/Bowel: Visualized portions are unremarkable. Vascular/Lymphatic: Aortic atherosclerosis, without evidence of aneurysm or dissection in the abdominal vasculature. Previously described pancreatic mass appears separated from both the superior mesenteric artery and vein, both of which are patent at this time. Splenic vein, splenoportal confluence and portal vein are also widely patent. Mildly enlarged portacaval lymph node measuring 1.1 cm in short axis (axial image 30 of series 23), which demonstrates diffusion restriction, suspicious for a metastatic node. Other:  Trace volume of perihepatic ascites. Musculoskeletal: No definite suspicious appearing osseous lesions are confidently identified on today's examination. IMPRESSION: 1. Progression of disease with enlargement of the primary lesion  in the head of the pancreas, increased number and size of numerous metastatic lesions throughout the liver, and probable metastatic portacaval lymph node, as detailed above. 2. The pancreatic head mass is obstructing the common bile duct resulting in extrahepatic biliary ductal dilatation. 3. 1.4 cm lesion in the lateral aspect of the interpolar region of the right kidney highly suspicious for primary renal cell carcinoma. This is encapsulated within Gerota's fascia, and is well separated from the right renal vein which is patent. 4. Cholelithiasis. 5. Additional incidental findings, as above. Electronically Signed   By: Vinnie Langton M.D.   On: 06/09/2020 17:00   MR ABDOMEN MRCP W WO CONTAST  Result Date: 06/09/2020 CLINICAL DATA:  69 year old male with history of pancreatic cancer presenting with jaundice and weakness. EXAM: MRI ABDOMEN WITHOUT  AND WITH CONTRAST (INCLUDING MRCP) TECHNIQUE: Multiplanar multisequence MR imaging of the abdomen was performed both before and after the administration of intravenous contrast. Heavily T2-weighted images of the biliary and pancreatic ducts were obtained, and three-dimensional MRCP images were rendered by post processing. CONTRAST:  78m GADAVIST GADOBUTROL 1 MMOL/ML IV SOLN COMPARISON:  No prior abdominal MRI. CT the abdomen and pelvis 04/24/2020. FINDINGS: Lower chest: Unremarkable. Hepatobiliary: Innumerable T1 hypointense, T2 hyperintense, diffusion restricting centrally hypovascular peripherally enhancing lesions are scattered throughout all aspects of the hepatic parenchyma, increased in number and size compared to the prior CT examination, compatible with widespread metastatic disease. Among the largest of these lesions is a lesion in segment 8 of the liver (axial image 15 of series 21) measuring 2.2 x 1.5 cm. No intrahepatic biliary ductal dilatation. Common bile duct is dilated measuring 1.4 cm in the porta hepatis, obstructed by the large pancreatic mass  (discussed below). Gallbladder is dilated with multiple small filling defects within the gallbladder compatible with gallstones. Gallbladder wall does not appear thickened and there is no definite pericholecystic fluid or inflammatory changes to clearly indicate an associated cholecystitis at this time. Pancreas: Large mass in the head of the pancreas which is slightly low T1 signal intensity, slightly high T2 signal intensity and is generally hypovascular, estimated to measure approximately 2.4 x 2.1 x 3.1 cm (axial image 51 of series 23 and coronal image 26 of series 25). This appears to occlude the common bile duct. At this time, the main pancreatic duct is ectatic but not dilated measuring up to 3 mm in the body of the pancreas. Mild increased T2 signal intensity surrounding the pancreas, which could reflect mild pancreatitis. No well-defined peripancreatic fluid collection to suggest pseudocyst at this time. Spleen:  Unremarkable. Adrenals/Urinary Tract: In the lateral aspect of the interpolar region of the right kidney (axial image 20 of series 4) there is a 1.4 cm lesion which is generally T1 hypointense (with mild heterogeneity in signal intensity), generally T2 hyperintense, with some internal T2 hypointensity, which corresponds to areas of internal nodular enhancement on post gadolinium images, concerning for a small renal neoplasm. This is encapsulated within Gerota's fascia and is well separated from the right renal vein which is widely patent. Left kidney and bilateral adrenal glands are normal in appearance. No hydroureteronephrosis in the visualized portions of the abdomen. Stomach/Bowel: Visualized portions are unremarkable. Vascular/Lymphatic: Aortic atherosclerosis, without evidence of aneurysm or dissection in the abdominal vasculature. Previously described pancreatic mass appears separated from both the superior mesenteric artery and vein, both of which are patent at this time. Splenic vein,  splenoportal confluence and portal vein are also widely patent. Mildly enlarged portacaval lymph node measuring 1.1 cm in short axis (axial image 30 of series 23), which demonstrates diffusion restriction, suspicious for a metastatic node. Other:  Trace volume of perihepatic ascites. Musculoskeletal: No definite suspicious appearing osseous lesions are confidently identified on today's examination. IMPRESSION: 1. Progression of disease with enlargement of the primary lesion in the head of the pancreas, increased number and size of numerous metastatic lesions throughout the liver, and probable metastatic portacaval lymph node, as detailed above. 2. The pancreatic head mass is obstructing the common bile duct resulting in extrahepatic biliary ductal dilatation. 3. 1.4 cm lesion in the lateral aspect of the interpolar region of the right kidney highly suspicious for primary renal cell carcinoma. This is encapsulated within Gerota's fascia, and is well separated from the right renal vein which is patent. 4. Cholelithiasis.  5. Additional incidental findings, as above. Electronically Signed   By: Vinnie Langton M.D.   On: 06/09/2020 17:00    Review of Systems  Blood pressure 119/79, pulse 99, temperature 98.2 F (36.8 C), temperature source Oral, resp. rate 20, SpO2 99 %. Physical Exam Constitutional:      Comments: Patient is alert. He is thin.  HENT:     Nose:     Comments: He is edentulous.    Mouth/Throat:     Mouth: Mucous membranes are moist.     Pharynx: Oropharynx is clear.  Eyes:     General: Scleral icterus present.  Cardiovascular:     Rate and Rhythm: Regular rhythm.     Heart sounds: Normal heart sounds. No murmur heard.      Comments: He has Port-A-Cath in left pectoral region. Pulmonary:     Effort: Pulmonary effort is normal.     Breath sounds: Normal breath sounds.  Abdominal:     Comments: Abdomen is flat and soft.  Liver edge is easily palpable.  Liver is tender.    Musculoskeletal:     Cervical back: Neck supple.     Comments: He has left BKA.  Lymphadenopathy:     Cervical: No cervical adenopathy.  Skin:    General: Skin is warm and dry.   MRCP reviewed with Dr. Lavonia Dana earlier today. Biliary obstruction appears to be secondary to pancreatic head mass well above cystic duct takeoff.  Gallbladder is distended and may have single stone.  Assessment/Plan  Obstructive jaundice secondary to known pancreatic adenocarcinoma. ERCP with biliary stenting.  Fully covered Wallstent would be placed. I reviewed the procedure risks with the patient and his daughter both agreeable.  Hildred Laser, MD 06/11/2020, 1:52 PM

## 2020-06-11 NOTE — Transfer of Care (Signed)
Immediate Anesthesia Transfer of Care Note  Patient: [name].  Procedure(s) Performed: ENDOSCOPIC RETROGRADE CHOLANGIOPANCREATOGRAPHY (ERCP) (N/A ) BILIARY STENT PLACEMENT (N/A ) SHORT SPHINCTEROTOMY  Patient Location: PACU  Anesthesia Type:General  Level of Consciousness: awake  Airway & Oxygen Therapy: Patient Spontanous Breathing  Post-op Assessment: Report given to RN, Post -op Vital signs reviewed and stable and Patient moving all extremities  Post vital signs: Reviewed and stable  Last Vitals:  Vitals Value Taken Time  BP 143/86 06/11/20 1551  Temp    Pulse 84 06/11/20 1558  Resp 20 06/11/20 1558  SpO2 96 % 06/11/20 1558  Vitals shown include unvalidated device data.  Last Pain:  Vitals:   06/11/20 1249  TempSrc: Oral  PainSc: 8       Patients Stated Pain Goal: 8 (41/28/20 8138)  Complications: No complications documented.

## 2020-06-11 NOTE — Anesthesia Preprocedure Evaluation (Signed)
Anesthesia Evaluation  Patient identified by MRN, date of birth, ID band Patient awake    Reviewed: Allergy & Precautions, NPO status , Patient's Chart, lab work & pertinent test results  History of Anesthesia Complications Negative for: history of anesthetic complications  Airway Mallampati: II  TM Distance: >3 FB Neck ROM: Full    Dental  (+) Edentulous Lower, Edentulous Upper   Pulmonary Current SmokerPatient did not abstain from smoking.,    Pulmonary exam normal breath sounds clear to auscultation       Cardiovascular Exercise Tolerance: Good hypertension, Pt. on medications + Peripheral Vascular Disease  Normal cardiovascular exam Rhythm:Regular Rate:Normal - Systolic murmurs, - Diastolic murmurs, - Friction Rub, - Carotid Bruit, - Peripheral Edema and - Systolic Click    Neuro/Psych  Neuromuscular disease negative psych ROS   GI/Hepatic negative GI ROS, Pancreatic cancer metastasized to liver   Endo/Other  diabetes, Well Controlled, Type 2, Oral Hypoglycemic Agents  Renal/GU negative Renal ROS  negative genitourinary   Musculoskeletal  (+) Arthritis ,   Abdominal   Peds  Hematology  (+) anemia ,   Anesthesia Other Findings   Reproductive/Obstetrics                            Anesthesia Physical Anesthesia Plan  ASA: IV  Anesthesia Plan: General   Post-op Pain Management:    Induction: Intravenous  PONV Risk Score and Plan: 2 and Ondansetron  Airway Management Planned: Oral ETT  Additional Equipment:   Intra-op Plan:   Post-operative Plan: Extubation in OR  Informed Consent: I have reviewed the patients History and Physical, chart, labs and discussed the procedure including the risks, benefits and alternatives for the proposed anesthesia with the patient or authorized representative who has indicated his/her understanding and acceptance.     Dental advisory  given  Plan Discussed with: CRNA and Surgeon  Anesthesia Plan Comments:        Anesthesia Quick Evaluation

## 2020-06-12 NOTE — Anesthesia Postprocedure Evaluation (Signed)
Anesthesia Post Note  Patient: Mortimer Bair Sr.  Procedure(s) Performed: ENDOSCOPIC RETROGRADE CHOLANGIOPANCREATOGRAPHY (ERCP) (N/A ) BILIARY STENT PLACEMENT (N/A ) SHORT SPHINCTEROTOMY  Patient location during evaluation: PACU Anesthesia Type: General Level of consciousness: awake and alert and oriented Pain management: pain level controlled Vital Signs Assessment: post-procedure vital signs reviewed and stable Respiratory status: spontaneous breathing and respiratory function stable Cardiovascular status: blood pressure returned to baseline Postop Assessment: no apparent nausea or vomiting Anesthetic complications: no   No complications documented.   Last Vitals:  Vitals:   06/11/20 1642 06/11/20 1658  BP: 136/86 140/85  Pulse: 84 85  Resp: 14 16  Temp:  37 C  SpO2:  95%    Last Pain:  Vitals:   06/11/20 1658  TempSrc: Oral  PainSc: 6                  Danelia Snodgrass C Aneshia Jacquet

## 2020-06-14 NOTE — Addendum Note (Signed)
Addendum  created 06/14/20 0905 by Ollen Bowl, CRNA   Charge Capture section accepted

## 2020-06-15 ENCOUNTER — Encounter (HOSPITAL_COMMUNITY): Payer: Self-pay | Admitting: Internal Medicine

## 2020-06-17 ENCOUNTER — Other Ambulatory Visit (HOSPITAL_COMMUNITY): Payer: Self-pay | Admitting: *Deleted

## 2020-06-17 ENCOUNTER — Inpatient Hospital Stay (HOSPITAL_COMMUNITY): Payer: Medicare Other

## 2020-06-17 ENCOUNTER — Inpatient Hospital Stay (HOSPITAL_BASED_OUTPATIENT_CLINIC_OR_DEPARTMENT_OTHER): Payer: Medicare Other | Admitting: Hematology

## 2020-06-17 ENCOUNTER — Other Ambulatory Visit: Payer: Self-pay

## 2020-06-17 VITALS — BP 134/69 | HR 85 | Temp 96.8°F | Resp 16 | Wt 138.6 lb

## 2020-06-17 VITALS — BP 129/68 | HR 74 | Temp 97.5°F | Resp 18

## 2020-06-17 DIAGNOSIS — E860 Dehydration: Secondary | ICD-10-CM

## 2020-06-17 DIAGNOSIS — E876 Hypokalemia: Secondary | ICD-10-CM

## 2020-06-17 DIAGNOSIS — C259 Malignant neoplasm of pancreas, unspecified: Secondary | ICD-10-CM

## 2020-06-17 DIAGNOSIS — C787 Secondary malignant neoplasm of liver and intrahepatic bile duct: Secondary | ICD-10-CM | POA: Diagnosis not present

## 2020-06-17 DIAGNOSIS — Z5111 Encounter for antineoplastic chemotherapy: Secondary | ICD-10-CM | POA: Diagnosis not present

## 2020-06-17 LAB — CBC WITH DIFFERENTIAL/PLATELET
Abs Immature Granulocytes: 0.08 10*3/uL — ABNORMAL HIGH (ref 0.00–0.07)
Basophils Absolute: 0.1 10*3/uL (ref 0.0–0.1)
Basophils Relative: 1 %
Eosinophils Absolute: 0.5 10*3/uL (ref 0.0–0.5)
Eosinophils Relative: 3 %
HCT: 37.6 % — ABNORMAL LOW (ref 39.0–52.0)
Hemoglobin: 11.9 g/dL — ABNORMAL LOW (ref 13.0–17.0)
Immature Granulocytes: 1 %
Lymphocytes Relative: 13 %
Lymphs Abs: 2.2 10*3/uL (ref 0.7–4.0)
MCH: 28.6 pg (ref 26.0–34.0)
MCHC: 31.6 g/dL (ref 30.0–36.0)
MCV: 90.4 fL (ref 80.0–100.0)
Monocytes Absolute: 1.6 10*3/uL — ABNORMAL HIGH (ref 0.1–1.0)
Monocytes Relative: 9 %
Neutro Abs: 13.1 10*3/uL — ABNORMAL HIGH (ref 1.7–7.7)
Neutrophils Relative %: 73 %
Platelets: 525 10*3/uL — ABNORMAL HIGH (ref 150–400)
RBC: 4.16 MIL/uL — ABNORMAL LOW (ref 4.22–5.81)
RDW: 16.3 % — ABNORMAL HIGH (ref 11.5–15.5)
WBC: 17.7 10*3/uL — ABNORMAL HIGH (ref 4.0–10.5)
nRBC: 0 % (ref 0.0–0.2)

## 2020-06-17 LAB — COMPREHENSIVE METABOLIC PANEL
ALT: 36 U/L (ref 0–44)
AST: 56 U/L — ABNORMAL HIGH (ref 15–41)
Albumin: 2.5 g/dL — ABNORMAL LOW (ref 3.5–5.0)
Alkaline Phosphatase: 608 U/L — ABNORMAL HIGH (ref 38–126)
Anion gap: 13 (ref 5–15)
BUN: 6 mg/dL — ABNORMAL LOW (ref 8–23)
CO2: 26 mmol/L (ref 22–32)
Calcium: 8.5 mg/dL — ABNORMAL LOW (ref 8.9–10.3)
Chloride: 97 mmol/L — ABNORMAL LOW (ref 98–111)
Creatinine, Ser: 0.69 mg/dL (ref 0.61–1.24)
GFR calc Af Amer: >60 mL/min (ref >60)
GFR calc non Af Amer: >60 mL/min (ref >60)
Glucose, Bld: 307 mg/dL — ABNORMAL HIGH (ref 70–99)
Potassium: 2.7 mmol/L — CL (ref 3.5–5.1)
Sodium: 136 mmol/L (ref 135–145)
Total Bilirubin: 7.3 mg/dL — ABNORMAL HIGH (ref 0.3–1.2)
Total Protein: 7.1 g/dL (ref 6.5–8.1)

## 2020-06-17 LAB — BILIRUBIN, DIRECT: Bilirubin, Direct: 3.7 mg/dL — ABNORMAL HIGH (ref 0.0–0.2)

## 2020-06-17 MED ORDER — HEPARIN SOD (PORK) LOCK FLUSH 100 UNIT/ML IV SOLN
500.0000 [IU] | Freq: Once | INTRAVENOUS | Status: AC
  Administered 2020-06-17: 500 [IU] via INTRAVENOUS

## 2020-06-17 MED ORDER — LOPERAMIDE HCL 2 MG PO CAPS
4.0000 mg | ORAL_CAPSULE | Freq: Once | ORAL | Status: AC
  Administered 2020-06-17: 4 mg via ORAL

## 2020-06-17 MED ORDER — SODIUM CHLORIDE 0.9 % IV SOLN
Freq: Once | INTRAVENOUS | Status: AC
  Filled 2020-06-17: qty 1000

## 2020-06-17 MED ORDER — LOPERAMIDE HCL 2 MG PO CAPS
ORAL_CAPSULE | ORAL | Status: AC
  Filled 2020-06-17: qty 2

## 2020-06-17 MED ORDER — OXYCODONE HCL 5 MG PO TABS
10.0000 mg | ORAL_TABLET | Freq: Once | ORAL | Status: AC
  Administered 2020-06-17: 10 mg via ORAL
  Filled 2020-06-17: qty 2

## 2020-06-17 MED ORDER — POTASSIUM CHLORIDE CRYS ER 20 MEQ PO TBCR
40.0000 meq | EXTENDED_RELEASE_TABLET | ORAL | Status: AC
  Administered 2020-06-17 (×2): 40 meq via ORAL
  Filled 2020-06-17 (×2): qty 2

## 2020-06-17 MED ORDER — OXYCODONE HCL 5 MG PO TABS: 10.0000 mg | ORAL_TABLET | Freq: Four times a day (QID) | ORAL | 0 refills | Status: DC | PRN

## 2020-06-17 MED ORDER — OXYCODONE HCL 5 MG PO TABS
10.0000 mg | ORAL_TABLET | Freq: Four times a day (QID) | ORAL | 0 refills | Status: DC | PRN
Start: 2020-06-17 — End: 2020-06-17

## 2020-06-17 MED ORDER — POTASSIUM CHLORIDE CRYS ER 20 MEQ PO TBCR: 40.0000 meq | EXTENDED_RELEASE_TABLET | Freq: Every day | ORAL | 2 refills | Status: DC

## 2020-06-17 NOTE — Patient Instructions (Signed)
[place] at [facility] Discharge Instructions  You were seen today by Dr. Delton Coombes. He went over your recent results. You received fluids today. You will be prescribed potassium to take 2 tablets daily. You will also be prescribed oxycodone 5 mg to take 2 tablets every 6 hours as needed for pain. Dr. Delton Coombes will see you back in 1 week for labs and follow up.   Thank you for choosing [place] at [facility] to provide your oncology and hematology care.  To afford each patient quality time with our provider, please arrive at least 15 minutes before your scheduled appointment time.   If you have a lab appointment with the [facility] please come in thru the Main Entrance and check in at the main information desk  You need to re-schedule your appointment should you arrive 10 or more minutes late.  We strive to give you quality time with our providers, and arriving late affects you and other patients whose appointments are after yours.  Also, if you no show three or more times for appointments you may be dismissed from the clinic at the providers discretion.     Again, thank you for choosing [facility].  Our hope is that these requests will decrease the amount of time that you wait before being seen by our physicians.       _____________________________________________________________  Should you have questions after your visit to [facility], please contact our office at [phone] between the hours of 8:00 a.m. and 4:30 p.m.  Voicemails left after 4:00 p.m. will not be returned until the following business day.  For prescription refill requests, have your pharmacy contact our office and allow 72 hours.    Cancer Center Support Programs:   > Cancer Support Group  2nd Tuesday of the month 1pm-2pm, Journey Room

## 2020-06-17 NOTE — Progress Notes (Signed)
Patient presents today for follow up appointment with Dr. Delton Coombes. Patient added on the schedule for House fluids today, Potassium PO 40 mEq pre and post House Fluids, Immodium 4mg  PO x 1 dose, and 10 mg Oxy IR/ Roxicodone x 1 dose PO.  Patient given the Immodium/ Diarrhea management sheet and patient teaching performed. Patient verbalized an understanding. Patient instructed to get Imodium for home use. Patient states, " I don't have any Imodium at the house." Patient states he has had diarrhea for 3 days and did not take any medication for management.   House fluids given today per MD orders. Tolerated infusion without adverse affects. Vital signs stable. No complaints at this time. Discharged from clinic ambulatory in stable condition. Alert and oriented x 3. F/U with [facility] as scheduled.

## 2020-06-17 NOTE — Progress Notes (Signed)
[place], [place] [address]   CLINIC:  Medical Oncology/Hematology  PCP:  Perlie Mayo, NP [address] 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  PRIOR THERAPY: None  NGS Results: Not done  CURRENT THERAPY: FOLFIRINOX to begin  BRIEF ONCOLOGIC HISTORY:  Oncology History  Pancreatic cancer metastasized to liver ([place])  05/21/2020 Initial Diagnosis   Pancreatic cancer metastasized to liver ([place])   06/09/2020 -  Chemotherapy   The patient had palonosetron (ALOXI) injection 0.25 mg, 0.25 mg, Intravenous,  Once, 0 of 4 cycles irinotecan (CAMPTOSAR) 200 mg in sodium chloride 0.9 % 500 mL chemo infusion, 112.5 mg/m2 = 200 mg (75 % of original dose 150 mg/m2), Intravenous,  Once, 0 of 4 cycles Dose modification: 112.5 mg/m2 (75 % of original dose 150 mg/m2, Cycle 1, Reason: Provider Judgment) oxaliplatin (ELOXATIN) 115 mg in dextrose 5 % 500 mL chemo infusion, 63.75 mg/m2 = 115 mg (75 % of original dose 85 mg/m2), Intravenous,  Once, 0 of 4 cycles Dose modification: 63.75 mg/m2 (75 % of original dose 85 mg/m2, Cycle 1, Reason: Provider Judgment) fosaprepitant (EMEND) 150 mg in sodium chloride 0.9 % 145 mL IVPB, 150 mg, Intravenous,  Once, 0 of 4 cycles fluorouracil (ADRUCIL) 3,300 mg in sodium chloride 0.9 % 84 mL chemo infusion, 1,800 mg/m2 = 3,300 mg (75 % of original dose 2,400 mg/m2), Intravenous, 1 Day/Dose, 0 of 4 cycles Dose modification: 1,800 mg/m2 (75 % of original dose 2,400 mg/m2, Cycle 1, Reason: Provider Judgment) leucovorin 550 mg in sodium chloride 0.9 % 250 mL infusion, 300 mg/m2 = 550 mg (75 % of original dose 400 mg/m2), Intravenous,  Once, 0 of 4 cycles Dose modification: 300 mg/m2 (75 % of original dose 400 mg/m2, Cycle 1, Reason: Provider Judgment)  for chemotherapy treatment.      CANCER STAGING: Cancer Staging No matching staging information  was found for the patient.  INTERVAL HISTORY:  Mr. John Wenzlick Sr., a 69 y.o. 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 [name] was last seen on 06/09/2020.  Today he reports that he had the ERCP on 9/17 with biliary stent placement and short sphincterotomy. He is having severe abdmoinal pain in his right side. He is nauseous and having 4 episodes of watery BM every day and his appetite is depleted. He finished his oxycodone last night.   REVIEW OF SYSTEMS:  Review of Systems  Constitutional: Positive for appetite change (depleted), chills (at night) and fatigue (depleted).  Gastrointestinal: Positive for abdominal pain (9/10 abdominal pain), diarrhea and vomiting (dry heaving).  Neurological: Positive for dizziness.  Psychiatric/Behavioral: Positive for sleep disturbance.  All other systems reviewed and are negative.   PAST MEDICAL/SURGICAL HISTORY:  Past Medical History:  Diagnosis Date  . Acute blood loss anemia   . Arthritis   . Benign essential HTN   . Cellulitis 07/20/2018  . Cellulitis in diabetic foot ([place]) 07/20/2018  . Diabetes mellitus without complication ([place])   . Diabetes mellitus, type II ([place]) 07/20/2018  . Dry gangrene ([facility] [facility]) 07/20/2018  . Emesis   . Family history of cervical cancer   . Heart murmur   . Hyperglycemia   . Hypertension   . Joint pain   . Port-A-Cath in place 05/25/2020  . Unilateral complete BKA, left, initial encounter [facility])    Past Surgical History:  Procedure Laterality Date  .  AMPUTATION Left 08/02/2018   Procedure: LEFT AMPUTATION BELOW KNEE;  Surgeon: Marty Heck, MD;  Location: [facility];  Service: Vascular;  Laterality: Left;  . BILIARY STENT PLACEMENT N/A 06/11/2020   Procedure: BILIARY STENT PLACEMENT;  Surgeon: Rogene Houston, MD;  [facility];  Service: Endoscopy;  Laterality: N/A;  . ELBOW SURGERY     Bone graft to repair elbow  . ERCP N/A 06/11/2020   Procedure:  ENDOSCOPIC RETROGRADE CHOLANGIOPANCREATOGRAPHY (ERCP);  Surgeon: Rogene Houston, MD;  [facility];  Service: Endoscopy;  Laterality: N/A;  . LOWER EXTREMITY ANGIOGRAPHY N/A 07/24/2018   Procedure: LOWER EXTREMITY ANGIOGRAPHY;  Surgeon: Marty Heck, MD;  Location: [place] [facility];  Service: Cardiovascular;  Laterality: N/A;  . PERIPHERAL VASCULAR INTERVENTION  07/24/2018   Procedure: PERIPHERAL VASCULAR INTERVENTION;  Surgeon: Marty Heck, MD;  Location: [place] [facility];  Service: Cardiovascular;;  . PORTACATH PLACEMENT Left 05/21/2020   Procedure: INSERTION PORT-A-CATH;  Surgeon: Aviva Signs, MD;  Location[facility];  Service: General;  Laterality: Left;  . SPHINCTEROTOMY  06/11/2020   Procedure: SHORT SPHINCTEROTOMY;  Surgeon: Rogene Houston, MD;  [facility];  Service: Endoscopy;;  . TONSILLECTOMY    . TRANSMETATARSAL AMPUTATION Left 07/25/2018   Procedure: LEFT GREAT TOE AMPUTATION;  Surgeon: Marty Heck, MD;  Location: [facility];  Service: Vascular;  Laterality: Left;  . TRANSMETATARSAL AMPUTATION Left 07/30/2018   Procedure: TRANSMETATARSAL AMPUTATION;  Surgeon: Waynetta Sandy, MD;  Location: [facility];  Service: Vascular;  Laterality: Left;    SOCIAL HISTORY:  Social History   Socioeconomic History  . Marital status: Married    Spouse name: [name]  . Number of children: 4  . Years of education: Not on file  . Highest education level: Not on file  Occupational History  . Not on file  Tobacco Use  . Smoking status: Current Every Day Smoker    Packs/day: 0.50    Types: Cigarettes  . Smokeless tobacco: Former Systems developer    Types: Snuff    Quit date: 04/21/1989  Vaping Use  . Vaping Use: Never used  Substance and Sexual Activity  . Alcohol use: Not Currently    Alcohol/week: 0.0 standard drinks    Comment: stopped 2 years ago  . Drug use: No  . Sexual activity: Not Currently  Other Topics Concern  . Not on file  Social  History Narrative   Lives with wife [name]- married 68 years    4 children with wife. All grown- live close by       15 Cats and 5 Dogs      Enjoys: walking when hip lets him, camping, likes being outside       Diet: eats all food groups    Caffeine: diet sodas 3    Water: 6-8 cups daily -gallon       Wears seat belt    Does not drive- daughter drove him 21 years ago lost license   No smoke detectors due to wood burning stove    Weapons- Sales executive          Social Determinants of Health   Financial Resource Strain: Medium Risk  . Difficulty of Paying Living Expenses: Somewhat hard  Food Insecurity: No Food Insecurity  . Worried About Charity fundraiser in the Last Year: Never true  . Ran Out of Food in the Last Year: Never true  Transportation Needs: No Transportation Needs  .  Lack of Transportation (Medical): No  . Lack of Transportation (Non-Medical): No  Physical Activity: Inactive  . Days of Exercise per Week: 0 days  . Minutes of Exercise per Session: 0 min  Stress: No Stress Concern Present  . Feeling of Stress : Not at all  Social Connections: Moderately Integrated  . Frequency of Communication with Friends and Family: Twice a week  . Frequency of Social Gatherings with Friends and Family: Twice a week  . Attends Religious Services: 1 to 4 times per year  . Active Member of Clubs or Organizations: No  . Attends Archivist Meetings: Never  . Marital Status: Married  Human resources officer Violence: Not At Risk  . Fear of Current or Ex-Partner: No  . Emotionally Abused: No  . Physically Abused: No  . Sexually Abused: No    FAMILY HISTORY:  Family History  Problem Relation Age of Onset  . Alcoholism Mother   . Alcoholism Father   . Cervical cancer Sister        dx. late 55s  . Cervical cancer Sister        dx. late 66s    CURRENT MEDICATIONS:  Current Outpatient Medications  Medication Sig Dispense Refill  . acetaminophen (TYLENOL) 500 MG tablet Take  500-1,000 mg by mouth every 6 (six) hours as needed (for pain).    . blood glucose meter kit and supplies KIT Dispense based on patient and insurance preference. Use up to four times daily as directed. (FOR ICD-9 250.00, 250.01). 1 each 0  . dexamethasone (DECADRON) 10 MG/ML injection Inject 10 mg into the vein every 14 (fourteen) days. Dexamethasone 10mg  in NS 50 ml IVPB prior to chemotherapy administration    . fluorouracil CALGB 31540 in sodium chloride 0.9 % 150 mL Inject 2,400 mg/m2 into the vein over 48 hr.    . fosaprepitant (EMEND) 150 MG SOLR injection Inject 150 mg into the vein every 14 (fourteen) days. Prior to chemotherapy administration    . IRINOTECAN HCL IV Inject 280 mg/m2 into the vein every 14 (fourteen) days.    Marland Kitchen LEUCOVORIN CALCIUM IV Inject 400 mg/m2 into the vein every 14 (fourteen) days.    Marland Kitchen lidocaine-prilocaine (EMLA) cream Apply a small amount to port a cath site and cover with plastic wrap 1 hour prior to chemotherapy appointments 30 g 3  . metFORMIN (GLUCOPHAGE) 500 MG tablet Take 2 tablets (1,000 mg total) by mouth 2 (two) times daily with a meal. (Patient taking differently: Take 500 mg by mouth in the morning and at bedtime. ) 60 tablet 3  . OXALIPLATIN IV Inject 85 mg/m2 into the vein every 14 (fourteen) days.    . palonosetron (ALOXI) 0.25 MG/5ML injection Inject 0.25 mg into the vein every 14 (fourteen) days. Prior to chemotherapy administration    . aspirin EC 81 MG tablet Take 81 mg by mouth daily. Swallow whole. (Patient not taking: Reported on 06/17/2020)    . bisacodyl (DULCOLAX) 5 MG EC tablet Take 5 mg by mouth daily as needed for moderate constipation. (Patient not taking: Reported on 06/17/2020)    . oxyCODONE (ROXICODONE) 5 MG immediate release tablet Take 1 tablet (5 mg total) by mouth every 6 (six) hours as needed for moderate pain or severe pain. (Patient not taking: Reported on 06/17/2020) 30 tablet 0   No current facility-administered medications for this  visit.    ALLERGIES:  Allergies  Allergen Reactions  . Tea Itching and Rash    PHYSICAL EXAM:  Performance status (ECOG): 1 - Symptomatic but completely ambulatory  Vitals:   06/17/20 0856  BP: 134/69  Pulse: 85  Resp: 16  Temp: (!) 96.8 F (36 C)  SpO2: 100%   Wt Readings from Last 3 Encounters:  06/17/20 138 lb 9.6 oz (62.9 kg)  05/25/20 145 lb 1.9 oz (65.8 kg)  05/24/20 147 lb 11.2 oz (67 kg)   Physical Exam Vitals reviewed.  Constitutional:      Appearance: Normal appearance.  Cardiovascular:     Rate and Rhythm: Normal rate and regular rhythm.     Pulses: Normal pulses.     Heart sounds: Normal heart sounds.  Pulmonary:     Effort: Pulmonary effort is normal.     Breath sounds: Normal breath sounds.  Chest:     Comments: Port-a-Cath in L chest Abdominal:     Tenderness: There is abdominal tenderness in the right upper quadrant and epigastric area.  Musculoskeletal:     Right lower leg: No edema.     Left Lower Extremity: Left leg is amputated below knee.  Neurological:     General: No focal deficit present.     Mental Status: He is alert and oriented to person, place, and time.  Psychiatric:        Mood and Affect: Mood normal.        Behavior: Behavior normal.      LABORATORY DATA:  I have reviewed the labs as listed.  CBC Latest Ref Rng & Units 06/17/2020 06/09/2020 05/03/2020  WBC 4.0 - 10.5 K/uL 17.7(H) 13.8(H) 9.2  Hemoglobin 13.0 - 17.0 g/dL 11.9(L) 12.0(L) 13.3  Hematocrit 39 - 52 % 37.6(L) 36.1(L) 41.0  Platelets 150 - 400 K/uL 525(H) 439(H) 291   CMP Latest Ref Rng & Units 06/17/2020 06/09/2020 04/25/2020  Glucose 70 - 99 mg/dL 307(H) 313(H) 258(H)  BUN 8 - 23 mg/dL 6(L) 11 8  Creatinine 0.61 - 1.24 mg/dL 0.69 0.37(L) 0.65  Sodium 135 - 145 mmol/L 136 134(L) 137  Potassium 3.5 - 5.1 mmol/L 2.7(LL) 3.3(L) 3.9  Chloride 98 - 111 mmol/L 97(L) 97(L) 106  CO2 22 - 32 mmol/L $RemoveB'26 25 22  '[id number]$ Calcium 8.9 - 10.3 mg/dL 8.5(L) 9.0 8.4(L)  Total Protein 6.5 -  8.1 g/dL 7.1 7.3 -  Total Bilirubin 0.3 - 1.2 mg/dL 7.3(H) 17.9(H) -  Alkaline Phos 38 - 126 U/L 608(H) 830(H) -  AST 15 - 41 U/L 56(H) 73(H) -  ALT 0 - 44 U/L 36 56(H) -    DIAGNOSTIC IMAGING:  I have independently reviewed the scans and discussed with the patient. MR 3D Recon At Scanner  Result Date: 06/09/2020 CLINICAL DATA:  69 year old male with history of pancreatic cancer presenting with jaundice and weakness. EXAM: MRI ABDOMEN WITHOUT AND WITH CONTRAST (INCLUDING MRCP) TECHNIQUE: Multiplanar multisequence MR imaging of the abdomen was performed both before and after the administration of intravenous contrast. Heavily T2-weighted images of the biliary and pancreatic ducts were obtained, and three-dimensional MRCP images were rendered by post processing. CONTRAST:  85mL GADAVIST GADOBUTROL 1 MMOL/ML IV SOLN COMPARISON:  No prior abdominal MRI. CT the abdomen and pelvis 04/24/2020. FINDINGS: Lower chest: Unremarkable. Hepatobiliary: Innumerable T1 hypointense, T2 hyperintense, diffusion restricting centrally hypovascular peripherally enhancing lesions are scattered throughout all aspects of the hepatic parenchyma, increased in number and size compared to the prior CT examination, compatible with widespread metastatic disease. Among the largest of these lesions is a lesion in segment 8 of the liver (axial image 15 of  series 21) measuring 2.2 x 1.5 cm. No intrahepatic biliary ductal dilatation. Common bile duct is dilated measuring 1.4 cm in the porta hepatis, obstructed by the large pancreatic mass (discussed below). Gallbladder is dilated with multiple small filling defects within the gallbladder compatible with gallstones. Gallbladder wall does not appear thickened and there is no definite pericholecystic fluid or inflammatory changes to clearly indicate an associated cholecystitis at this time. Pancreas: Large mass in the head of the pancreas which is slightly low T1 signal intensity, slightly high  T2 signal intensity and is generally hypovascular, estimated to measure approximately 2.4 x 2.1 x 3.1 cm (axial image 51 of series 23 and coronal image 26 of series 25). This appears to occlude the common bile duct. At this time, the main pancreatic duct is ectatic but not dilated measuring up to 3 mm in the body of the pancreas. Mild increased T2 signal intensity surrounding the pancreas, which could reflect mild pancreatitis. No well-defined peripancreatic fluid collection to suggest pseudocyst at this time. Spleen:  Unremarkable. Adrenals/Urinary Tract: In the lateral aspect of the interpolar region of the right kidney (axial image 20 of series 4) there is a 1.4 cm lesion which is generally T1 hypointense (with mild heterogeneity in signal intensity), generally T2 hyperintense, with some internal T2 hypointensity, which corresponds to areas of internal nodular enhancement on post gadolinium images, concerning for a small renal neoplasm. This is encapsulated within Gerota's fascia and is well separated from the right renal vein which is widely patent. Left kidney and bilateral adrenal glands are normal in appearance. No hydroureteronephrosis in the visualized portions of the abdomen. Stomach/Bowel: Visualized portions are unremarkable. Vascular/Lymphatic: Aortic atherosclerosis, without evidence of aneurysm or dissection in the abdominal vasculature. Previously described pancreatic mass appears separated from both the superior mesenteric artery and vein, both of which are patent at this time. Splenic vein, splenoportal confluence and portal vein are also widely patent. Mildly enlarged portacaval lymph node measuring 1.1 cm in short axis (axial image 30 of series 23), which demonstrates diffusion restriction, suspicious for a metastatic node. Other:  Trace volume of perihepatic ascites. Musculoskeletal: No definite suspicious appearing osseous lesions are confidently identified on today's examination. IMPRESSION:  1. Progression of disease with enlargement of the primary lesion in the head of the pancreas, increased number and size of numerous metastatic lesions throughout the liver, and probable metastatic portacaval lymph node, as detailed above. 2. The pancreatic head mass is obstructing the common bile duct resulting in extrahepatic biliary ductal dilatation. 3. 1.4 cm lesion in the lateral aspect of the interpolar region of the right kidney highly suspicious for primary renal cell carcinoma. This is encapsulated within Gerota's fascia, and is well separated from the right renal vein which is patent. 4. Cholelithiasis. 5. Additional incidental findings, as above. Electronically Signed   By: Vinnie Langton M.D.   On: 06/09/2020 17:00   NM PET Image Initial (PI) Skull Base To Thigh  Result Date: 05/19/2020 CLINICAL DATA:  Initial treatment strategy for pancreas adenocarcinoma. EXAM: NUCLEAR MEDICINE PET SKULL BASE TO THIGH TECHNIQUE: 6.9 mCi F-18 FDG was injected intravenously. Full-ring PET imaging was performed from the skull base to thigh after the radiotracer. CT data was obtained and used for attenuation correction and anatomic localization. Fasting blood glucose: 238 mg/dl COMPARISON:  CT [facility] 04/24/2020. FINDINGS: Mediastinal blood pool activity: SUV max 2.92 Liver activity: SUV max NA NECK: No hypermetabolic lymph nodes in the neck. Incidental CT findings: none CHEST: No FDG avid axillary,  supraclavicular, mediastinal, or hilar lymph nodes. Tiny perifissural nodule within the anterolateral right base measures 3 mm and is too small to reliably characterize. Tiny 4 mm subpleural nodule in the posterolateral right lower lobe is far too small to reliably characterize. No FDG avid pulmonary nodules identified bilaterally. Incidental CT findings: Moderate paraseptal and centrilobular emphysema. Aortic atherosclerosis. Coronary artery calcifications. ABDOMEN/PELVIS: Numerous low-attenuation lesions compatible with  biopsy-proven adenocarcinoma liver metastasis are again noted. Most of these are too small to reliably characterize by PET-CT. Several foci of increased FDG uptake above background liver activity are noted within the left hepatic lobe with SUV max is between 4.69 and 4.84. Likely representing metastasis which are occult on the corresponding CT images. Mass within the head of pancreas is FDG avid within SUV max of 5.5. No abnormal FDG uptake within the spleen, adrenal glands. No significant FDG uptake identified within the abdominopelvic lymph nodes. Recently described enhancing lesion within the right kidney is suboptimally evaluated on today's exam. Incidental CT findings: Gallstones. Aortic atherosclerosis. No aneurysm. SKELETON: No focal hypermetabolic activity to suggest skeletal metastasis. Incidental CT findings: Thoracolumbar degenerative disc disease noted. IMPRESSION: 1. FDG avid head of pancreas mass compatible with primary pancreatic adenocarcinoma. 2. Multifocal low-attenuation lesions are scattered throughout both lobes of liver compatible with biopsy-proven liver metastases. Most of these are too small to reliably characterize by PET-CT. On the PET images there are several foci of increased FDG uptake above background activity most likely representing foci of metastasis. 3. No FDG avid lymph nodes within the abdomen. No evidence for FDG avid metastasis to the chest or skeletal structures. 4. Two small lung nodules are identified within the right lower lobe measuring less than 5 mm. These are far too small to reliably characterize by PET-CT. 5. Aortic Atherosclerosis (ICD10-I70.0) and Emphysema (ICD10-J43.9). 6. Coronary artery calcifications. Electronically Signed   By: Kerby Moors M.D.   On: 05/19/2020 11:17   DG Chest Port 1 View  Result Date: 05/21/2020 CLINICAL DATA:  LEFT Port-A-Cath placement, history of metastatic pancreatic carcinoma EXAM: PORTABLE CHEST 1 VIEW COMPARISON:  08/04/2018  FINDINGS: LEFT-sided Port-A-Cath placed since the previous study terminates at the distal aspect of the superior vena cava entering via subclavian approach. Cardiomediastinal contours and hilar structures are normal. No pneumothorax. No lobar consolidation.  No pleural effusion. On limited assessment visualized skeletal structures without acute process. IMPRESSION: LEFT-sided Port-A-Cath placed since the previous study. No pneumothorax. Electronically Signed   By: Zetta Bills M.D.   On: 05/21/2020 08:12   DG ERCP  Result Date: 06/11/2020 CLINICAL DATA:  Metastatic pancreatic carcinoma and biliary obstruction. EXAM: ERCP TECHNIQUE: Multiple spot images obtained with the fluoroscopic device and submitted for interpretation post-procedure. COMPARISON:  MRI of the abdomen on 06/09/2020 FINDINGS: Imaging at the time of ERCP demonstrates cannulation of the common bile duct with contrast injection demonstrating severe dilatation of the common bile duct due to obstruction at the level of the pancreatic head. A permanent metallic stent was placed across the level of obstruction. IMPRESSION: Permanent biliary stent placement to treat biliary obstruction secondary to known pancreatic carcinoma. These images were submitted for radiologic interpretation only. Please see the procedural report for the amount of contrast and the fluoroscopy time utilized. Electronically Signed   By: Aletta Edouard M.D.   On: 06/11/2020 16:17   DG C-Arm 1-60 Min-No Report  Result Date: 05/21/2020 Fluoroscopy was utilized by the requesting physician.  No radiographic interpretation.   MR ABDOMEN MRCP W WO CONTAST  Result Date: 06/09/2020 CLINICAL DATA:  69 year old male with history of pancreatic cancer presenting with jaundice and weakness. EXAM: MRI ABDOMEN WITHOUT AND WITH CONTRAST (INCLUDING MRCP) TECHNIQUE: Multiplanar multisequence MR imaging of the abdomen was performed both before and after the administration of intravenous  contrast. Heavily T2-weighted images of the biliary and pancreatic ducts were obtained, and three-dimensional MRCP images were rendered by post processing. CONTRAST:  60mL GADAVIST GADOBUTROL 1 MMOL/ML IV SOLN COMPARISON:  No prior abdominal MRI. CT the abdomen and pelvis 04/24/2020. FINDINGS: Lower chest: Unremarkable. Hepatobiliary: Innumerable T1 hypointense, T2 hyperintense, diffusion restricting centrally hypovascular peripherally enhancing lesions are scattered throughout all aspects of the hepatic parenchyma, increased in number and size compared to the prior CT examination, compatible with widespread metastatic disease. Among the largest of these lesions is a lesion in segment 8 of the liver (axial image 15 of series 21) measuring 2.2 x 1.5 cm. No intrahepatic biliary ductal dilatation. Common bile duct is dilated measuring 1.4 cm in the porta hepatis, obstructed by the large pancreatic mass (discussed below). Gallbladder is dilated with multiple small filling defects within the gallbladder compatible with gallstones. Gallbladder wall does not appear thickened and there is no definite pericholecystic fluid or inflammatory changes to clearly indicate an associated cholecystitis at this time. Pancreas: Large mass in the head of the pancreas which is slightly low T1 signal intensity, slightly high T2 signal intensity and is generally hypovascular, estimated to measure approximately 2.4 x 2.1 x 3.1 cm (axial image 51 of series 23 and coronal image 26 of series 25). This appears to occlude the common bile duct. At this time, the main pancreatic duct is ectatic but not dilated measuring up to 3 mm in the body of the pancreas. Mild increased T2 signal intensity surrounding the pancreas, which could reflect mild pancreatitis. No well-defined peripancreatic fluid collection to suggest pseudocyst at this time. Spleen:  Unremarkable. Adrenals/Urinary Tract: In the lateral aspect of the interpolar region of the right  kidney (axial image 20 of series 4) there is a 1.4 cm lesion which is generally T1 hypointense (with mild heterogeneity in signal intensity), generally T2 hyperintense, with some internal T2 hypointensity, which corresponds to areas of internal nodular enhancement on post gadolinium images, concerning for a small renal neoplasm. This is encapsulated within Gerota's fascia and is well separated from the right renal vein which is widely patent. Left kidney and bilateral adrenal glands are normal in appearance. No hydroureteronephrosis in the visualized portions of the abdomen. Stomach/Bowel: Visualized portions are unremarkable. Vascular/Lymphatic: Aortic atherosclerosis, without evidence of aneurysm or dissection in the abdominal vasculature. Previously described pancreatic mass appears separated from both the superior mesenteric artery and vein, both of which are patent at this time. Splenic vein, splenoportal confluence and portal vein are also widely patent. Mildly enlarged portacaval lymph node measuring 1.1 cm in short axis (axial image 30 of series 23), which demonstrates diffusion restriction, suspicious for a metastatic node. Other:  Trace volume of perihepatic ascites. Musculoskeletal: No definite suspicious appearing osseous lesions are confidently identified on today's examination. IMPRESSION: 1. Progression of disease with enlargement of the primary lesion in the head of the pancreas, increased number and size of numerous metastatic lesions throughout the liver, and probable metastatic portacaval lymph node, as detailed above. 2. The pancreatic head mass is obstructing the common bile duct resulting in extrahepatic biliary ductal dilatation. 3. 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 This is encapsulated within Gerota's fascia, and is well separated from the right renal vein which is patent. 4. Cholelithiasis. 5.  Additional incidental findings, as above. Electronically Signed   By: Vinnie Langton M.D.   On: 06/09/2020 17:00     ASSESSMENT:  1. 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 -Current active smoker, 1 pack/day for 47 years. No history of alcohol use. No family history of malignancy. -CT AP with contrast on 04/24/2020 shows 2.2 cm pancreatic head mass, small hypodense lesions throughout the bilateral lower lobes, predominantly in the right lower lobe, largest measuring 1.1 cm. 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 Mildly prominent lymph nodes within the upper abdomen above the pancreas head suspicious for metastatic lymphadenopathy. 5 mm pleural-based right lower lobe nodule. -CA 19-9 is 12. -Liver biopsy on 05/03/2020 consistent with metastatic adenocarcinoma, positive for CK7, CK20, CDX2. Differential diagnosis includes primary upper GI versus pancreaticobiliary. -PET scan on 05/19/2020 shows FDG avid had a pancreatic mass, multifocal low-attenuation lesions scattered throughout both lobes of the liver compatible with biopsy-proven liver metastasis. No FDG avid lymph nodes in the abdomen. No evidence of metastasis in the chest or bones. 2 small lung nodules within the right lower lobe measuring less than 5 mm.  2. Poorly controlled diabetes: -Hemoglobin A1c was 10.8 during hospitalization.  3. Abdominal pain: -Highly likely malignancy related. -He is currently on hydrocodone 5/325 1 to 2 tablets every 4 hours as needed. Patient reports that he still has pain but manageable.  4. Right kidney mass: -No further work-up needed if biopsy of liver lesion confirms pancreatic adenocarcinoma.   PLAN:  1.Metastatic pancreatic cancer to the liver: -ERCP and a Wallstent placement on 06/11/2020. -Reviewed labs today which  showed improvement of total bilirubin to 7.3 and direct bilirubin to 3.7.  AST is 56. -We will continue to hold treatment at this time.  I plan to recheck his labs next week.  We will likely start treatment at that time.  2. Poorly controlled diabetes: -Continue Metformin 1000 mg twice daily.  3. Abdominal pain: -He is taking oxycodone 5 mg tablet every 6 hours.  Its not helping his pain. -I will increase oxycodone to 10 mg every 6 hours as needed.  4. Right kidney mass: -Right kidney lesion suspicious for RCC.  5. Genetic testing: -Follow-up on genetic testing.  6.  Hypokalemia: Potassium today is 2.7.  He is having some diarrhea. -Potassium will be repleted today. -We will start him on potassium 40 mEq daily at home.   Orders placed this encounter:  Orders Placed This Encounter  Procedures  . Bilirubin, direct     Derek Jack, MD [place] [phone]   I, Milinda Antis, am acting as a scribe for Dr. Sanda Linger.  I, Derek Jack MD, have reviewed the above documentation for accuracy and completeness, and I agree with the above.

## 2020-06-17 NOTE — Progress Notes (Signed)
Patient is here today for office visit. His potassium is 2.7.  Orders received for potassium 40 mEq by mouth followed by 1 liter of normal saline with electrolytes and then another potassium 40 mEq by mouth afterwards.  Patient has been out of pain medication, orders received for oxycodone 10 mg po today times one dose as well.  Agricultural consultant and pharmacy aware.

## 2020-06-17 NOTE — Patient Instructions (Signed)
[facility] at [facility]  Discharge Instructions:   _______________________________________________________________  Thank you for choosing [facility] at [facility] to provide your oncology and hematology care.  To afford each patient quality time with our providers, please arrive at least 15 minutes before your scheduled appointment.  You need to re-schedule your appointment if you arrive 10 or more minutes late.  We strive to give you quality time with our providers, and arriving late affects you and other patients whose appointments are after yours.  Also, if you no show three or more times for appointments you may be dismissed from the clinic.  Again, thank you for choosing [facility] at [facility]. Our hope is that these requests will allow you access to exceptional care and in a timely manner. _______________________________________________________________  If you have questions after your visit, please contact our office at [phone] between the hours of 8:30 a.m. and 5:00 p.m. Voicemails left after 4:30 p.m. will not be returned until the following business day. _______________________________________________________________  For prescription refill requests, have your pharmacy contact our office. _______________________________________________________________  Recommendations made by the consultant and any test results will be sent to your referring physician. _______________________________________________________________ 

## 2020-06-22 ENCOUNTER — Inpatient Hospital Stay (HOSPITAL_COMMUNITY): Payer: Medicare Other

## 2020-06-22 ENCOUNTER — Other Ambulatory Visit (HOSPITAL_COMMUNITY): Payer: Self-pay | Admitting: *Deleted

## 2020-06-22 ENCOUNTER — Other Ambulatory Visit (HOSPITAL_COMMUNITY): Payer: Medicare Other

## 2020-06-22 ENCOUNTER — Inpatient Hospital Stay (HOSPITAL_BASED_OUTPATIENT_CLINIC_OR_DEPARTMENT_OTHER): Payer: Medicare Other | Admitting: Hematology

## 2020-06-22 ENCOUNTER — Other Ambulatory Visit: Payer: Self-pay

## 2020-06-22 ENCOUNTER — Ambulatory Visit (HOSPITAL_COMMUNITY): Payer: Medicare Other | Admitting: Hematology

## 2020-06-22 VITALS — BP 147/72 | HR 97 | Temp 96.8°F | Resp 18 | Wt 142.2 lb

## 2020-06-22 DIAGNOSIS — C787 Secondary malignant neoplasm of liver and intrahepatic bile duct: Secondary | ICD-10-CM

## 2020-06-22 DIAGNOSIS — C259 Malignant neoplasm of pancreas, unspecified: Secondary | ICD-10-CM | POA: Diagnosis not present

## 2020-06-22 DIAGNOSIS — R300 Dysuria: Secondary | ICD-10-CM | POA: Diagnosis not present

## 2020-06-22 DIAGNOSIS — Z5111 Encounter for antineoplastic chemotherapy: Secondary | ICD-10-CM | POA: Diagnosis not present

## 2020-06-22 LAB — URINALYSIS, ROUTINE W REFLEX MICROSCOPIC
Bacteria, UA: NONE SEEN
Bilirubin Urine: NEGATIVE
Glucose, UA: >=500 mg/dL — AB
Hgb urine dipstick: NEGATIVE
Ketones, ur: NEGATIVE mg/dL
Leukocytes,Ua: NEGATIVE
Nitrite: NEGATIVE
Protein, ur: NEGATIVE mg/dL
Specific Gravity, Urine: 1.035 — ABNORMAL HIGH (ref 1.005–1.030)
pH: 5 (ref 5.0–8.0)

## 2020-06-22 LAB — COMPREHENSIVE METABOLIC PANEL
ALT: 29 U/L (ref 0–44)
AST: 51 U/L — ABNORMAL HIGH (ref 15–41)
Albumin: 2.4 g/dL — ABNORMAL LOW (ref 3.5–5.0)
Alkaline Phosphatase: 477 U/L — ABNORMAL HIGH (ref 38–126)
Anion gap: 10 (ref 5–15)
BUN: 6 mg/dL — ABNORMAL LOW (ref 8–23)
CO2: 27 mmol/L (ref 22–32)
Calcium: 8.4 mg/dL — ABNORMAL LOW (ref 8.9–10.3)
Chloride: 97 mmol/L — ABNORMAL LOW (ref 98–111)
Creatinine, Ser: 0.64 mg/dL (ref 0.61–1.24)
GFR calc Af Amer: >60 mL/min (ref >60)
GFR calc non Af Amer: >60 mL/min (ref >60)
Glucose, Bld: 374 mg/dL — ABNORMAL HIGH (ref 70–99)
Potassium: 3.2 mmol/L — ABNORMAL LOW (ref 3.5–5.1)
Sodium: 134 mmol/L — ABNORMAL LOW (ref 135–145)
Total Bilirubin: 5 mg/dL — ABNORMAL HIGH (ref 0.3–1.2)
Total Protein: 6.6 g/dL (ref 6.5–8.1)

## 2020-06-22 LAB — CBC WITH DIFFERENTIAL/PLATELET
Abs Immature Granulocytes: 0.07 10*3/uL (ref 0.00–0.07)
Basophils Absolute: 0.1 10*3/uL (ref 0.0–0.1)
Basophils Relative: 1 %
Eosinophils Absolute: 0.3 10*3/uL (ref 0.0–0.5)
Eosinophils Relative: 2 %
HCT: 32.7 % — ABNORMAL LOW (ref 39.0–52.0)
Hemoglobin: 10.5 g/dL — ABNORMAL LOW (ref 13.0–17.0)
Immature Granulocytes: 1 %
Lymphocytes Relative: 12 %
Lymphs Abs: 1.9 10*3/uL (ref 0.7–4.0)
MCH: 28.9 pg (ref 26.0–34.0)
MCHC: 32.1 g/dL (ref 30.0–36.0)
MCV: 90.1 fL (ref 80.0–100.0)
Monocytes Absolute: 1.3 10*3/uL — ABNORMAL HIGH (ref 0.1–1.0)
Monocytes Relative: 8 %
Neutro Abs: 11.9 10*3/uL — ABNORMAL HIGH (ref 1.7–7.7)
Neutrophils Relative %: 76 %
Platelets: 330 10*3/uL (ref 150–400)
RBC: 3.63 MIL/uL — ABNORMAL LOW (ref 4.22–5.81)
RDW: 16.2 % — ABNORMAL HIGH (ref 11.5–15.5)
WBC: 15.5 10*3/uL — ABNORMAL HIGH (ref 4.0–10.5)
nRBC: 0 % (ref 0.0–0.2)

## 2020-06-22 MED ORDER — TAMSULOSIN HCL 0.4 MG PO CAPS: 0.4000 mg | ORAL_CAPSULE | Freq: Every day | ORAL | 2 refills | Status: DC

## 2020-06-22 NOTE — Patient Instructions (Signed)
[facility] at [facility] Discharge Instructions  You were seen today by Dr. Delton Coombes. He went over your recent results. You will receive your treatment tomorrow. Take 2 tablets of oxycodone for your abdominal pain. Your next visit will be with the nurse practitioner in 1 week for labs and follow up. Dr. Delton Coombes will see you back in 2 weeks for labs and follow up.   Thank you for choosing [facility] at [facility] to provide your oncology and hematology care.  To afford each patient quality time with our provider, please arrive at least 15 minutes before your scheduled appointment time.   If you have a lab appointment with the [facility] please come in thru the Main Entrance and check in at the main information desk  You need to re-schedule your appointment should you arrive 10 or more minutes late.  We strive to give you quality time with our providers, and arriving late affects you and other patients whose appointments are after yours.  Also, if you no show three or more times for appointments you may be dismissed from the clinic at the providers discretion.     Again, thank you for choosing [facility].  Our hope is that these requests will decrease the amount of time that you wait before being seen by our physicians.       _____________________________________________________________  Should you have questions after your visit to [facility], please contact our office at [phone] between the hours of 8:00 a.m. and 4:30 p.m.  Voicemails left after 4:00 p.m. will not be returned until the following business day.  For prescription refill requests, have your pharmacy contact our office and allow 72 hours.    Cancer Center Support Programs:   > Cancer Support Group  2nd Tuesday of the month 1pm-2pm, Journey Room

## 2020-06-22 NOTE — Progress Notes (Signed)
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  PRIOR THERAPY: None  NGS Results: Not done  CURRENT THERAPY: FOLFIRINOX  BRIEF ONCOLOGIC HISTORY:  Oncology History  Pancreatic cancer metastasized to liver ([name])  05/21/2020 Initial Diagnosis   Pancreatic cancer metastasized to liver ([facility])   06/23/2020 -  Chemotherapy   The patient had palonosetron (ALOXI) injection 0.25 mg, 0.25 mg, Intravenous,  Once, 0 of 4 cycles irinotecan (CAMPTOSAR) 200 mg in sodium chloride 0.9 % 500 mL chemo infusion, 112.5 mg/m2 = 200 mg (75 % of original dose 150 mg/m2), Intravenous,  Once, 0 of 4 cycles Dose modification: 112.5 mg/m2 (75 % of original dose 150 mg/m2, Cycle 1, Reason: Provider Judgment) oxaliplatin (ELOXATIN) 115 mg in dextrose 5 % 500 mL chemo infusion, 63.75 mg/m2 = 115 mg (75 % of original dose 85 mg/m2), Intravenous,  Once, 0 of 4 cycles Dose modification: 63.75 mg/m2 (75 % of original dose 85 mg/m2, Cycle 1, Reason: Provider Judgment) fosaprepitant (EMEND) 150 mg in sodium chloride 0.9 % 145 mL IVPB, 150 mg, Intravenous,  Once, 0 of 4 cycles fluorouracil (ADRUCIL) 3,300 mg in sodium chloride 0.9 % 84 mL chemo infusion, 1,800 mg/m2 = 3,300 mg (75 % of original dose 2,400 mg/m2), Intravenous, 1 Day/Dose, 0 of 4 cycles Dose modification: 1,800 mg/m2 (75 % of original dose 2,400 mg/m2, Cycle 1, Reason: Provider Judgment) leucovorin 550 mg in sodium chloride 0.9 % 250 mL infusion, 300 mg/m2 = 550 mg (75 % of original dose 400 mg/m2), Intravenous,  Once, 0 of 4 cycles Dose modification: 300 mg/m2 (75 % of original dose 400 mg/m2, Cycle 1, Reason: Provider Judgment)  for chemotherapy treatment.      CANCER STAGING: Cancer Staging No matching staging information was found  for the patient.  INTERVAL HISTORY:  Mr. John Rubin Sr., a 69 y.o. male, returns for routine follow-up and consideration for first cycle of chemotherapy. [name] was last seen on 06/17/2020.  Due for initiating cycle #1 of FOLFIRINOX tomorrow.   Overall, he tells me he has been feeling bad. He complains of dull, sharp, aching abdominal pain which is barely relieved with 5 mg of oxycodone every 6 hours, decreasing the pain from 10/10 to 9/10; he denies feeling drowsy from it. He also reports that he is producing very little urine and drinks 5 glasses of 16 ounces of water; he has never had this issue before and he denies burning with urination or difficulty with initiating the stream. He denies having N/V.  Overall, he feels ready for first cycle of chemo tomorrow.    REVIEW OF SYSTEMS:  Review of Systems  Constitutional: Positive for appetite change (depleted) and fatigue (50%).  Gastrointestinal: Positive for abdominal pain (9/10 dull sharp ache in abdomen). Negative for nausea and vomiting.  Genitourinary: Positive for difficulty urinating (urge but not producing much urine). Negative for dysuria.   Neurological: Positive for dizziness. Negative for light-headedness.  All other systems reviewed and are negative.   PAST MEDICAL/SURGICAL HISTORY:  Past Medical History:  Diagnosis Date  . Acute blood loss anemia   . Arthritis   . Benign essential HTN   . Cellulitis 07/20/2018  . Cellulitis in diabetic foot ([facility]) 07/20/2018  . Diabetes mellitus without complication ([facility])   . Diabetes  mellitus, type II ([facility]) 07/20/2018  . Dry gangrene ([facility]) 07/20/2018  . Emesis   . Family history of cervical cancer   . Heart murmur   . Hyperglycemia   . Hypertension   . Joint pain   . Port-A-Cath in place 05/25/2020  . Unilateral complete BKA, left, initial encounter [facility])    Past Surgical History:  Procedure Laterality Date  . AMPUTATION Left 08/02/2018   Procedure: LEFT AMPUTATION BELOW  KNEE;  Surgeon: Marty Heck, MD;  Location: [facility];  Service: Vascular;  Laterality: Left;  . BILIARY STENT PLACEMENT N/A 06/11/2020   Procedure: BILIARY STENT PLACEMENT;  Surgeon: Rogene Houston, MD;  [facility];  Service: Endoscopy;  Laterality: N/A;  . ELBOW SURGERY     Bone graft to repair elbow  . ERCP N/A 06/11/2020   Procedure: ENDOSCOPIC RETROGRADE CHOLANGIOPANCREATOGRAPHY (ERCP);  Surgeon: Rogene Houston, MD;  [facility];  Service: Endoscopy;  Laterality: N/A;  . LOWER EXTREMITY ANGIOGRAPHY N/A 07/24/2018   Procedure: LOWER EXTREMITY ANGIOGRAPHY;  Surgeon: Marty Heck, MD;  Location: [facility];  Service: Cardiovascular;  Laterality: N/A;  . PERIPHERAL VASCULAR INTERVENTION  07/24/2018   Procedure: PERIPHERAL VASCULAR INTERVENTION;  Surgeon: Marty Heck, MD;  Location: [facility];  Service: Cardiovascular;;  . PORTACATH PLACEMENT Left 05/21/2020   Procedure: INSERTION PORT-A-CATH;  Surgeon: Aviva Signs, MD;  Location[facility];  Service: General;  Laterality: Left;  . SPHINCTEROTOMY  06/11/2020   Procedure: SHORT SPHINCTEROTOMY;  Surgeon: Rogene Houston, MD;  [facility];  Service: Endoscopy;;  . TONSILLECTOMY    . TRANSMETATARSAL AMPUTATION Left 07/25/2018   Procedure: LEFT GREAT TOE AMPUTATION;  Surgeon: Marty Heck, MD;  Location: [facility];  Service: Vascular;  Laterality: Left;  . TRANSMETATARSAL AMPUTATION Left 07/30/2018   Procedure: TRANSMETATARSAL AMPUTATION;  Surgeon: Waynetta Sandy, MD;  Location: [facility];  Service: Vascular;  Laterality: Left;    SOCIAL HISTORY:  Social History   Socioeconomic History  . Marital status: Married    Spouse name: [name]  . Number of children: 4  . Years of education: Not on file  . Highest education level: Not on file  Occupational History  . Not on file  Tobacco Use  . Smoking status: Current Every Day Smoker    Packs/day: 0.50     Types: Cigarettes  . Smokeless tobacco: Former Systems developer    Types: Snuff    Quit date: 04/21/1989  Vaping Use  . Vaping Use: Never used  Substance and Sexual Activity  . Alcohol use: Not Currently    Alcohol/week: 0.0 standard drinks    Comment: stopped 2 years ago  . Drug use: No  . Sexual activity: Not Currently  Other Topics Concern  . Not on file  Social History Narrative   Lives with wife [name]- married 24 years    4 children with wife. All grown- live close by       15 Cats and 5 Dogs      Enjoys: walking when hip lets him, camping, likes being outside       Diet: eats all food groups    Caffeine: diet sodas 3    Water: 6-8 cups daily -gallon       Wears seat belt    Does not drive- daughter drove him 21 years ago lost license   No smoke detectors due to wood burning stove    Weapons- Sales executive  Social Determinants of Health   Financial Resource Strain: Medium Risk  . Difficulty of Paying Living Expenses: Somewhat hard  Food Insecurity: No Food Insecurity  . Worried About Charity fundraiser in the Last Year: Never true  . Ran Out of Food in the Last Year: Never true  Transportation Needs: No Transportation Needs  . Lack of Transportation (Medical): No  . Lack of Transportation (Non-Medical): No  Physical Activity: Inactive  . Days of Exercise per Week: 0 days  . Minutes of Exercise per Session: 0 min  Stress: No Stress Concern Present  . Feeling of Stress : Not at all  Social Connections: Moderately Integrated  . Frequency of Communication with Friends and Family: Twice a week  . Frequency of Social Gatherings with Friends and Family: Twice a week  . Attends Religious Services: 1 to 4 times per year  . Active Member of Clubs or Organizations: No  . Attends Archivist Meetings: Never  . Marital Status: Married  Human resources officer Violence: Not At Risk  . Fear of Current or Ex-Partner: No  . Emotionally Abused: No  . Physically Abused: No  .  Sexually Abused: No    FAMILY HISTORY:  Family History  Problem Relation Age of Onset  . Alcoholism Mother   . Alcoholism Father   . Cervical cancer Sister        dx. late 36s  . Cervical cancer Sister        dx. late 23s    CURRENT MEDICATIONS:  Current Outpatient Medications  Medication Sig Dispense Refill  . acetaminophen (TYLENOL) 500 MG tablet Take 500-1,000 mg by mouth every 6 (six) hours as needed (for pain).    . bisacodyl (DULCOLAX) 5 MG EC tablet Take 5 mg by mouth daily as needed for moderate constipation.     . blood glucose meter kit and supplies KIT Dispense based on patient and insurance preference. Use up to four times daily as directed. (FOR ICD-9 250.00, 250.01). 1 each 0  . dexamethasone (DECADRON) 10 MG/ML injection Inject 10 mg into the vein every 14 (fourteen) days. Dexamethasone 10mg  in NS 50 ml IVPB prior to chemotherapy administration    . fluorouracil CALGB 02637 in sodium chloride 0.9 % 150 mL Inject 2,400 mg/m2 into the vein over 48 hr.    . fosaprepitant (EMEND) 150 MG SOLR injection Inject 150 mg into the vein every 14 (fourteen) days. Prior to chemotherapy administration    . IRINOTECAN HCL IV Inject 280 mg/m2 into the vein every 14 (fourteen) days.    Marland Kitchen LEUCOVORIN CALCIUM IV Inject 400 mg/m2 into the vein every 14 (fourteen) days.    . metFORMIN (GLUCOPHAGE) 500 MG tablet Take 2 tablets (1,000 mg total) by mouth 2 (two) times daily with a meal. (Patient taking differently: Take 500 mg by mouth in the morning and at bedtime. ) 60 tablet 3  . OXALIPLATIN IV Inject 85 mg/m2 into the vein every 14 (fourteen) days.    Marland Kitchen oxyCODONE (ROXICODONE) 5 MG immediate release tablet Take 2 tablets (10 mg total) by mouth every 6 (six) hours as needed for moderate pain or severe pain. 120 tablet 0  . palonosetron (ALOXI) 0.25 MG/5ML injection Inject 0.25 mg into the vein every 14 (fourteen) days. Prior to chemotherapy administration    . potassium chloride SA (KLOR-CON) 20  MEQ tablet Take 2 tablets (40 mEq total) by mouth daily. 60 tablet 2  . aspirin EC 81 MG tablet Take 81 mg  by mouth daily. Swallow whole.  (Patient not taking: Reported on 06/22/2020)    . lidocaine-prilocaine (EMLA) cream Apply a small amount to port a cath site and cover with plastic wrap 1 hour prior to chemotherapy appointments (Patient not taking: Reported on 06/22/2020) 30 g 3   No current facility-administered medications for this visit.    ALLERGIES:  Allergies  Allergen Reactions  . Tea Itching and Rash    PHYSICAL EXAM:  Performance status (ECOG): 1 - Symptomatic but completely ambulatory  Vitals:   06/22/20 0750  BP: (!) 147/72  Pulse: 97  Resp: 18  Temp: (!) 96.8 F (36 C)  SpO2: 100%   Wt Readings from Last 3 Encounters:  06/22/20 142 lb 3.2 oz (64.5 kg)  06/17/20 138 lb 9.6 oz (62.9 kg)  05/25/20 145 lb 1.9 oz (65.8 kg)   Physical Exam Vitals reviewed.  Constitutional:      Appearance: Normal appearance.  Cardiovascular:     Rate and Rhythm: Normal rate and regular rhythm.     Pulses: Normal pulses.     Heart sounds: Normal heart sounds.  Pulmonary:     Effort: Pulmonary effort is normal.     Breath sounds: Normal breath sounds.  Abdominal:     Palpations: Abdomen is soft.     Tenderness: There is abdominal tenderness in the right upper quadrant and epigastric area.  Neurological:     General: No focal deficit present.     Mental Status: He is alert and oriented to person, place, and time.  Psychiatric:        Mood and Affect: Mood normal.        Behavior: Behavior normal.     LABORATORY DATA:  I have reviewed the labs as listed.  CBC Latest Ref Rng & Units 06/22/2020 06/17/2020 06/09/2020  WBC 4.0 - 10.5 K/uL 15.5(H) 17.7(H) 13.8(H)  Hemoglobin 13.0 - 17.0 g/dL 10.5(L) 11.9(L) 12.0(L)  Hematocrit 39 - 52 % 32.7(L) 37.6(L) 36.1(L)  Platelets 150 - 400 K/uL 330 525(H) 439(H)   CMP Latest Ref Rng & Units 06/22/2020 06/17/2020 06/09/2020  Glucose 70 -  99 mg/dL 374(H) 307(H) 313(H)  BUN 8 - 23 mg/dL 6(L) 6(L) 11  Creatinine 0.61 - 1.24 mg/dL 0.64 0.69 0.37(L)  Sodium 135 - 145 mmol/L 134(L) 136 134(L)  Potassium 3.5 - 5.1 mmol/L 3.2(L) 2.7(LL) 3.3(L)  Chloride 98 - 111 mmol/L 97(L) 97(L) 97(L)  CO2 22 - 32 mmol/L $RemoveB'27 26 25  '[id number]$ Calcium 8.9 - 10.3 mg/dL 8.4(L) 8.5(L) 9.0  Total Protein 6.5 - 8.1 g/dL 6.6 7.1 7.3  Total Bilirubin 0.3 - 1.2 mg/dL 5.0(H) 7.3(H) 17.9(H)  Alkaline Phos 38 - 126 U/L 477(H) 608(H) 830(H)  AST 15 - 41 U/L 51(H) 56(H) 73(H)  ALT 0 - 44 U/L 29 36 56(H)    DIAGNOSTIC IMAGING:  I have independently reviewed the scans and discussed with the patient. MR 3D Recon At Scanner  Result Date: 06/09/2020 CLINICAL DATA:  69 year old male with history of pancreatic cancer presenting with jaundice and weakness. EXAM: MRI ABDOMEN WITHOUT AND WITH CONTRAST (INCLUDING MRCP) TECHNIQUE: Multiplanar multisequence MR imaging of the abdomen was performed both before and after the administration of intravenous contrast. Heavily T2-weighted images of the biliary and pancreatic ducts were obtained, and three-dimensional MRCP images were rendered by post processing. CONTRAST:  71mL GADAVIST GADOBUTROL 1 MMOL/ML IV SOLN COMPARISON:  No prior abdominal MRI. CT the abdomen and pelvis 04/24/2020. FINDINGS: Lower chest: Unremarkable. Hepatobiliary: Innumerable T1 hypointense, T2  hyperintense, diffusion restricting centrally hypovascular peripherally enhancing lesions are scattered throughout all aspects of the hepatic parenchyma, increased in number and size compared to the prior CT examination, compatible with widespread metastatic disease. Among the largest of these lesions is a lesion in segment 8 of the liver (axial image 15 of series 21) measuring 2.2 x 1.5 cm. No intrahepatic biliary ductal dilatation. Common bile duct is dilated measuring 1.4 cm in the porta hepatis, obstructed by the large pancreatic mass (discussed below). Gallbladder is dilated with  multiple small filling defects within the gallbladder compatible with gallstones. Gallbladder wall does not appear thickened and there is no definite pericholecystic fluid or inflammatory changes to clearly indicate an associated cholecystitis at this time. Pancreas: Large mass in the head of the pancreas which is slightly low T1 signal intensity, slightly high T2 signal intensity and is generally hypovascular, estimated to measure approximately 2.4 x 2.1 x 3.1 cm (axial image 51 of series 23 and coronal image 26 of series 25). This appears to occlude the common bile duct. At this time, the main pancreatic duct is ectatic but not dilated measuring up to 3 mm in the body of the pancreas. Mild increased T2 signal intensity surrounding the pancreas, which could reflect mild pancreatitis. No well-defined peripancreatic fluid collection to suggest pseudocyst at this time. Spleen:  Unremarkable. Adrenals/Urinary Tract: In the lateral aspect of the interpolar region of the right kidney (axial image 20 of series 4) there is a 1.4 cm lesion which is generally T1 hypointense (with mild heterogeneity in signal intensity), generally T2 hyperintense, with some internal T2 hypointensity, which corresponds to areas of internal nodular enhancement on post gadolinium images, concerning for a small renal neoplasm. This is encapsulated within Gerota's fascia and is well separated from the right renal vein which is widely patent. Left kidney and bilateral adrenal glands are normal in appearance. No hydroureteronephrosis in the visualized portions of the abdomen. Stomach/Bowel: Visualized portions are unremarkable. Vascular/Lymphatic: Aortic atherosclerosis, without evidence of aneurysm or dissection in the abdominal vasculature. Previously described pancreatic mass appears separated from both the superior mesenteric artery and vein, both of which are patent at this time. Splenic vein, splenoportal confluence and portal vein are also  widely patent. Mildly enlarged portacaval lymph node measuring 1.1 cm in short axis (axial image 30 of series 23), which demonstrates diffusion restriction, suspicious for a metastatic node. Other:  Trace volume of perihepatic ascites. Musculoskeletal: No definite suspicious appearing osseous lesions are confidently identified on today's examination. IMPRESSION: 1. Progression of disease with enlargement of the primary lesion in the head of the pancreas, increased number and size of numerous metastatic lesions throughout the liver, and probable metastatic portacaval lymph node, as detailed above. 2. The pancreatic head mass is obstructing the common bile duct resulting in extrahepatic biliary ductal dilatation. 3. 1.4 cm lesion in the lateral aspect of the interpolar region of the right kidney highly suspicious for primary renal cell carcinoma. This is encapsulated within Gerota's fascia, and is well separated from the right renal vein which is patent. 4. Cholelithiasis. 5. Additional incidental findings, as above. Electronically Signed   By: Vinnie Langton M.D.   On: 06/09/2020 17:00   DG ERCP  Result Date: 06/11/2020 CLINICAL DATA:  Metastatic pancreatic carcinoma and biliary obstruction. EXAM: ERCP TECHNIQUE: Multiple spot images obtained with the fluoroscopic device and submitted for interpretation post-procedure. COMPARISON:  MRI of the abdomen on 06/09/2020 FINDINGS: Imaging at the time of ERCP demonstrates cannulation of the common bile  duct with contrast injection demonstrating severe dilatation of the common bile duct due to obstruction at the level of the pancreatic head. A permanent metallic stent was placed across the level of obstruction. IMPRESSION: Permanent biliary stent placement to treat biliary obstruction secondary to known pancreatic carcinoma. These images were submitted for radiologic interpretation only. Please see the procedural report for the amount of contrast and the fluoroscopy  time utilized. Electronically Signed   By: Aletta Edouard M.D.   On: 06/11/2020 16:17   MR ABDOMEN MRCP W WO CONTAST  Result Date: 06/09/2020 CLINICAL DATA:  69 year old male with history of pancreatic cancer presenting with jaundice and weakness. EXAM: MRI ABDOMEN WITHOUT AND WITH CONTRAST (INCLUDING MRCP) TECHNIQUE: Multiplanar multisequence MR imaging of the abdomen was performed both before and after the administration of intravenous contrast. Heavily T2-weighted images of the biliary and pancreatic ducts were obtained, and three-dimensional MRCP images were rendered by post processing. CONTRAST:  59mL GADAVIST GADOBUTROL 1 MMOL/ML IV SOLN COMPARISON:  No prior abdominal MRI. CT the abdomen and pelvis 04/24/2020. FINDINGS: Lower chest: Unremarkable. Hepatobiliary: Innumerable T1 hypointense, T2 hyperintense, diffusion restricting centrally hypovascular peripherally enhancing lesions are scattered throughout all aspects of the hepatic parenchyma, increased in number and size compared to the prior CT examination, compatible with widespread metastatic disease. Among the largest of these lesions is a lesion in segment 8 of the liver (axial image 15 of series 21) measuring 2.2 x 1.5 cm. No intrahepatic biliary ductal dilatation. Common bile duct is dilated measuring 1.4 cm in the porta hepatis, obstructed by the large pancreatic mass (discussed below). Gallbladder is dilated with multiple small filling defects within the gallbladder compatible with gallstones. Gallbladder wall does not appear thickened and there is no definite pericholecystic fluid or inflammatory changes to clearly indicate an associated cholecystitis at this time. Pancreas: Large mass in the head of the pancreas which is slightly low T1 signal intensity, slightly high T2 signal intensity and is generally hypovascular, estimated to measure approximately 2.4 x 2.1 x 3.1 cm (axial image 51 of series 23 and coronal image 26 of series 25). This  appears to occlude the common bile duct. At this time, the main pancreatic duct is ectatic but not dilated measuring up to 3 mm in the body of the pancreas. Mild increased T2 signal intensity surrounding the pancreas, which could reflect mild pancreatitis. No well-defined peripancreatic fluid collection to suggest pseudocyst at this time. Spleen:  Unremarkable. Adrenals/Urinary Tract: In the lateral aspect of the interpolar region of the right kidney (axial image 20 of series 4) there is a 1.4 cm lesion which is generally T1 hypointense (with mild heterogeneity in signal intensity), generally T2 hyperintense, with some internal T2 hypointensity, which corresponds to areas of internal nodular enhancement on post gadolinium images, concerning for a small renal neoplasm. This is encapsulated within Gerota's fascia and is well separated from the right renal vein which is widely patent. Left kidney and bilateral adrenal glands are normal in appearance. No hydroureteronephrosis in the visualized portions of the abdomen. Stomach/Bowel: Visualized portions are unremarkable. Vascular/Lymphatic: Aortic atherosclerosis, without evidence of aneurysm or dissection in the abdominal vasculature. Previously described pancreatic mass appears separated from both the superior mesenteric artery and vein, both of which are patent at this time. Splenic vein, splenoportal confluence and portal vein are also widely patent. Mildly enlarged portacaval lymph node measuring 1.1 cm in short axis (axial image 30 of series 23), which demonstrates diffusion restriction, suspicious for a metastatic node. Other:  Trace volume of perihepatic ascites. Musculoskeletal: No definite suspicious appearing osseous lesions are confidently identified on today's examination. IMPRESSION: 1. Progression of disease with enlargement of the primary lesion in the head of the pancreas, increased number and size of numerous metastatic lesions throughout the liver, and  probable metastatic portacaval lymph node, as detailed above. 2. The pancreatic head mass is obstructing the common bile duct resulting in extrahepatic biliary ductal dilatation. 3. 1.4 cm lesion in the lateral aspect of the interpolar region of the right kidney highly suspicious for primary renal cell carcinoma. This is encapsulated within Gerota's fascia, and is well separated from the right renal vein which is patent. 4. Cholelithiasis. 5. Additional incidental findings, as above. Electronically Signed   By: Vinnie Langton M.D.   On: 06/09/2020 17:00     ASSESSMENT:  1. 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 -Current active smoker, 1 pack/day for 47 years. No history of alcohol use. No family history of malignancy. -CT [facility] with contrast on 04/24/2020 shows 2.2 cm pancreatic head mass, small hypodense lesions throughout the bilateral lower lobes, predominantly in the right lower lobe, largest measuring 1.1 cm. 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 Mildly prominent lymph nodes within the upper abdomen above the pancreas head suspicious for metastatic lymphadenopathy. 5 mm pleural-based right lower lobe nodule. -CA 19-9 is 12. -Liver biopsy on 05/03/2020 consistent with metastatic adenocarcinoma, positive for CK7, CK20, CDX2. Differential diagnosis includes primary upper GI versus pancreaticobiliary. -PET scan on 05/19/2020 shows FDG avid had a pancreatic mass, multifocal low-attenuation lesions scattered throughout both lobes of the liver compatible with biopsy-proven liver metastasis. No FDG avid lymph nodes in the abdomen. No evidence of metastasis in the chest or bones. 2 small lung nodules within the right lower lobe measuring less than 5 mm. -ERCP and Wallstent placement on 06/11/2020.  2. Poorly controlled diabetes: -Hemoglobin A1c was  10.8 during hospitalization.  3. Abdominal pain: -Highly likely malignancy related. -He is currently on hydrocodone 5/325 1 to 2 tablets every 4 hours as needed. Patient reports that he still has pain but manageable.  4. Right kidney mass: -No further work-up needed if biopsy of liver lesion confirms pancreatic adenocarcinoma.   PLAN:  1.Metastatic pancreatic cancer to the liver: -We reviewed his labs.  Total bilirubin improved to 5.0.  This was previously 7.3.  AST is 51.  Alk phos is also improved 477. -CBC showed white count 15.5 and platelet count was normal. -I have recommended proceeding with her first cycle of chemotherapy.  I will dose reduce 5-FU by 40%.  We will also mildly dose reduce oxaliplatin.  I will hold off on giving Irinotecan during cycle 1. -We will see him back in 1 week for repeat labs and toxicity assessment.  2. Poorly controlled diabetes: -Continue Metformin 1000 mg twice daily.  3. Abdominal pain: -He is taking oxycodone 5 mg every 6 hours. -I have told him to increase to oxycodone 10 mg every 6 hours as needed.  4. 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  No further work-up needed.  5. Genetic testing: -Blood drawn on 06/09/2020.  Follow-up on results.  6.  Hypokalemia: Potassium today is 2.7.  He is having some diarrhea. -Potassium is 3.2 today. -He will continue potassium 40 mEq daily at home.  7.  Urinary frequency: -We checked a UA which  was negative for infection.  We will start on Flomax 0.4 mg at bedtime.   Orders placed this encounter:  No orders of the defined types were placed in this encounter.    Derek Jack, MD [place] [phone]   I, Milinda Antis, am acting as a scribe for Dr. Sanda Linger.  I, Derek Jack MD, have reviewed the above documentation for accuracy and completeness, and I agree with the above.

## 2020-06-23 ENCOUNTER — Other Ambulatory Visit (HOSPITAL_COMMUNITY): Payer: Medicare Other

## 2020-06-23 ENCOUNTER — Inpatient Hospital Stay (HOSPITAL_COMMUNITY): Payer: Medicare Other

## 2020-06-23 ENCOUNTER — Ambulatory Visit (HOSPITAL_COMMUNITY): Payer: Medicare Other | Admitting: Hematology

## 2020-06-23 VITALS — BP 152/79 | HR 80 | Temp 97.0°F | Resp 16 | Wt 141.6 lb

## 2020-06-23 DIAGNOSIS — Z95828 Presence of other vascular implants and grafts: Secondary | ICD-10-CM

## 2020-06-23 DIAGNOSIS — Z5111 Encounter for antineoplastic chemotherapy: Secondary | ICD-10-CM | POA: Diagnosis not present

## 2020-06-23 DIAGNOSIS — C259 Malignant neoplasm of pancreas, unspecified: Secondary | ICD-10-CM

## 2020-06-23 MED ORDER — PALONOSETRON HCL INJECTION 0.25 MG/5ML
0.2500 mg | Freq: Once | INTRAVENOUS | Status: AC
  Administered 2020-06-23: 0.25 mg via INTRAVENOUS
  Filled 2020-06-23: qty 5

## 2020-06-23 MED ORDER — SODIUM CHLORIDE 0.9 % IV SOLN
10.0000 mg | Freq: Once | INTRAVENOUS | Status: AC
  Administered 2020-06-23: 10 mg via INTRAVENOUS
  Filled 2020-06-23: qty 10

## 2020-06-23 MED ORDER — DEXTROSE 5 % IV SOLN: Freq: Once | INTRAVENOUS | Status: AC

## 2020-06-23 MED ORDER — OXALIPLATIN CHEMO INJECTION 100 MG/20ML
63.7500 mg/m2 | Freq: Once | INTRAVENOUS | Status: AC
  Administered 2020-06-23: 115 mg via INTRAVENOUS
  Filled 2020-06-23: qty 20

## 2020-06-23 MED ORDER — SODIUM CHLORIDE 0.9 % IV SOLN
1370.0000 mg/m2 | INTRAVENOUS | Status: DC
  Administered 2020-06-23: 2500 mg via INTRAVENOUS
  Filled 2020-06-23: qty 50

## 2020-06-23 MED ORDER — LEUCOVORIN CALCIUM INJECTION 350 MG
240.0000 mg/m2 | Freq: Once | INTRAVENOUS | Status: AC
  Administered 2020-06-23: 440 mg via INTRAVENOUS
  Filled 2020-06-23: qty 22

## 2020-06-23 MED ORDER — SODIUM CHLORIDE 0.9 % IV SOLN
150.0000 mg | Freq: Once | INTRAVENOUS | Status: AC
  Administered 2020-06-23: 150 mg via INTRAVENOUS
  Filled 2020-06-23: qty 150

## 2020-06-23 MED ORDER — PROCHLORPERAZINE MALEATE 10 MG PO TABS: 10.0000 mg | ORAL_TABLET | Freq: Four times a day (QID) | ORAL | 2 refills | Status: DC | PRN

## 2020-06-23 NOTE — Progress Notes (Signed)
Pt seen in office visit yesterday - labs and pt's assessed by Dr. Delton Coombes at that time.  Per MD, pt okay to proceed with tx today with dose-reduced 96fu and oxaliplatin.  Also, will hold irinotecan per MD until bilirubin normalizes.    Tolerated D1, C1 FOLFOX w/o adverse reaction. Pt reminded of cold sensitivity/cold induced neuropathy and to avoid drinking/eating or touching anything cold for the next several days to avoid this side effect; he verbalizes understanding.   Alert, in no distress.  VSS  Discharged ambulatory in stable condition with 5FU ambulatory pump infusing as ordered.

## 2020-06-23 NOTE — Addendum Note (Signed)
Addended by: Joie Bimler on: 06/23/2020 02:26 PM   Modules accepted: Orders

## 2020-06-24 ENCOUNTER — Ambulatory Visit (HOSPITAL_COMMUNITY): Payer: Medicare Other | Admitting: Hematology

## 2020-06-24 ENCOUNTER — Ambulatory Visit (HOSPITAL_COMMUNITY): Payer: Medicare Other

## 2020-06-24 ENCOUNTER — Other Ambulatory Visit (HOSPITAL_COMMUNITY): Payer: Medicare Other

## 2020-06-25 ENCOUNTER — Inpatient Hospital Stay (HOSPITAL_COMMUNITY): Payer: Medicare Other | Attending: Nurse Practitioner

## 2020-06-25 VITALS — BP 141/74 | HR 78 | Temp 97.2°F | Resp 18

## 2020-06-25 DIAGNOSIS — G893 Neoplasm related pain (acute) (chronic): Secondary | ICD-10-CM | POA: Diagnosis not present

## 2020-06-25 DIAGNOSIS — E876 Hypokalemia: Secondary | ICD-10-CM | POA: Insufficient documentation

## 2020-06-25 DIAGNOSIS — N289 Disorder of kidney and ureter, unspecified: Secondary | ICD-10-CM | POA: Diagnosis not present

## 2020-06-25 DIAGNOSIS — C250 Malignant neoplasm of head of pancreas: Secondary | ICD-10-CM | POA: Diagnosis not present

## 2020-06-25 DIAGNOSIS — R198 Other specified symptoms and signs involving the digestive system and abdomen: Secondary | ICD-10-CM | POA: Diagnosis not present

## 2020-06-25 DIAGNOSIS — E1165 Type 2 diabetes mellitus with hyperglycemia: Secondary | ICD-10-CM | POA: Diagnosis not present

## 2020-06-25 DIAGNOSIS — C787 Secondary malignant neoplasm of liver and intrahepatic bile duct: Secondary | ICD-10-CM | POA: Insufficient documentation

## 2020-06-25 DIAGNOSIS — Z5111 Encounter for antineoplastic chemotherapy: Secondary | ICD-10-CM | POA: Insufficient documentation

## 2020-06-25 DIAGNOSIS — R350 Frequency of micturition: Secondary | ICD-10-CM | POA: Insufficient documentation

## 2020-06-25 DIAGNOSIS — C259 Malignant neoplasm of pancreas, unspecified: Secondary | ICD-10-CM

## 2020-06-25 DIAGNOSIS — Z95828 Presence of other vascular implants and grafts: Secondary | ICD-10-CM

## 2020-06-25 MED ORDER — HEPARIN SOD (PORK) LOCK FLUSH 100 UNIT/ML IV SOLN
500.0000 [IU] | Freq: Once | INTRAVENOUS | Status: AC | PRN
  Administered 2020-06-25: 500 [IU]

## 2020-06-25 MED ORDER — SODIUM CHLORIDE 0.9% FLUSH
10.0000 mL | INTRAVENOUS | Status: DC | PRN
  Administered 2020-06-25: 10 mL

## 2020-06-25 NOTE — Patient Instructions (Signed)
[facility] Discharge Instructions for Patients Receiving Chemotherapy  Today you received the following chemotherapy agents   To help prevent nausea and vomiting after your treatment, we encourage you to take your nausea medication   If you develop nausea and vomiting that is not controlled by your nausea medication, call the clinic.   BELOW ARE SYMPTOMS THAT SHOULD BE REPORTED IMMEDIATELY:  *FEVER GREATER THAN 100.5 F  *CHILLS WITH OR WITHOUT FEVER  NAUSEA AND VOMITING THAT IS NOT CONTROLLED WITH YOUR NAUSEA MEDICATION  *UNUSUAL SHORTNESS OF BREATH  *UNUSUAL BRUISING OR BLEEDING  TENDERNESS IN MOUTH AND THROAT WITH OR WITHOUT PRESENCE OF ULCERS  *URINARY PROBLEMS  *BOWEL PROBLEMS  UNUSUAL RASH Items with * indicate a potential emergency and should be followed up as soon as possible.  Feel free to call the clinic should you have any questions or concerns. The clinic phone number is [phone].  Please show the CHEMO ALERT CARD at check-in to the Emergency Department and triage nurse.   

## 2020-06-25 NOTE — Progress Notes (Signed)
John Islam Sr. presented for pump d/c and port a cath flush.  Clean, Dry and Intact Good blood return present. Portacath flushed with 52ml NS and 500U/47ml Heparin per protocol and needle removed intact. Procedure without incident. Patient tolerated procedure well. Vital signs stable. No complaints at this time. Discharged from clinic ambulatory in stable condition. Alert and oriented x 3. F/U with [facility] as scheduled.

## 2020-06-28 ENCOUNTER — Telehealth (HOSPITAL_COMMUNITY): Payer: Self-pay

## 2020-06-28 NOTE — Telephone Encounter (Signed)
24 hour follow up- patient is doing well, no major issues reported by his daughter. Will follow up as scheduled.

## 2020-06-29 ENCOUNTER — Encounter (HOSPITAL_COMMUNITY): Payer: Self-pay

## 2020-06-30 ENCOUNTER — Telehealth: Payer: Self-pay | Admitting: Genetic Counselor

## 2020-06-30 ENCOUNTER — Encounter: Payer: Self-pay | Admitting: Genetic Counselor

## 2020-06-30 ENCOUNTER — Ambulatory Visit: Payer: Self-pay | Admitting: Genetic Counselor

## 2020-06-30 DIAGNOSIS — Z1379 Encounter for other screening for genetic and chromosomal anomalies: Secondary | ICD-10-CM

## 2020-06-30 NOTE — Telephone Encounter (Signed)
Revealed negative genetic testing. 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 It will therefore be important for him to stay in contact with genetics to keep up with whether additional testing may be appropriate in the future.   [name] also expressed concerns regarding weight loss, abdominal pain, and discomfort with his port. This will be passed onto his oncologist, Dr. Delton Coombes.

## 2020-06-30 NOTE — Progress Notes (Signed)
[name], [place] [id number]   CLINIC:  Medical Oncology/Hematology  PCP:  Perlie Mayo, NP [address] 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 The report date is 06/18/2020.   The Common Hereditary Cancers Panel offered by [facility] includes sequencing and/or deletion duplication testing of the following 48 genes: APC, ATM, AXIN2, BARD1, BMPR1A, BRCA1, BRCA2, BRIP1, CDH1, CDK4, CDKN2A (p14ARF), CDKN2A (p16INK4a), CHEK2, CTNNA1, DICER1, EPCAM (Deletion/duplication testing only), GREM1 (promoter region deletion/duplication testing only), KIT, MEN1, MLH1, MSH2, MSH3, MSH6, MUTYH, NBN, NF1, NTHL1, PALB2, PDGFRA, PMS2, POLD1, POLE, PTEN, RAD50, RAD51C, RAD51D, RNF43, SDHB, SDHC, SDHD, SMAD4, SMARCA4. STK11, TP53, TSC1, TSC2, and VHL.  The following genes were evaluated for sequence changes only: SDHA and HOXB13 c.251G>A variant only. The Pancreatic Cancer Panel offered by [facility] includes sequencing and deletion/duplication analysis of the following 29 genes: APC, ATM, BMPR1A, BRCA1, BRCA2, CASR, CDK4, CDKN2A, CFTR, CPA1, CTRC, EPCAM, FANCC, MEN1, MLH1, MSH2, MSH6, NF1, PALB2, PALLD, PMS2, PRSS1, SMAD4, SPINK1, STK11, TP53, TSC1, TSC2, and VHL.   06/23/2020 -  Chemotherapy   The patient had palonosetron (ALOXI) injection 0.25 mg, 0.25 mg, Intravenous,  Once, 1 of 4 cycles Administration: 0.25 mg (06/23/2020) irinotecan  (CAMPTOSAR) 280 mg in sodium chloride 0.9 % 500 mL chemo infusion, 150 mg/m2 = 280 mg, Intravenous,  Once, 0 of 3 cycles oxaliplatin (ELOXATIN) 115 mg in dextrose 5 % 500 mL chemo infusion, 63.75 mg/m2 = 115 mg (75 % of original dose 85 mg/m2), Intravenous,  Once, 1 of 4 cycles Dose modification: 63.75 mg/m2 (75 % of original dose 85 mg/m2, Cycle 1, Reason: Provider Judgment) Administration: 115 mg (06/23/2020) fosaprepitant (EMEND) 150 mg in sodium chloride 0.9 % 145 mL IVPB, 150 mg, Intravenous,  Once, 1 of 4 cycles Administration: 150 mg (06/23/2020) fluorouracil (ADRUCIL) 2,500 mg in sodium chloride 0.9 % 100 mL chemo infusion, 1,370 mg/m2 = 2,650 mg (60 % of original dose 2,400 mg/m2), Intravenous, 1 Day/Dose, 1 of 4 cycles Dose modification: 1,800 mg/m2 (75 % of original dose 2,400 mg/m2, Cycle 1, Reason: Provider Judgment), 1,440 mg/m2 (60 % of original dose 2,400 mg/m2, Cycle 1, Reason: Change in LFTs) Administration: 2,500 mg (06/23/2020) leucovorin 440 mg in dextrose 5 % 250 mL infusion, 240 mg/m2 = 440 mg (60 % of original dose 400 mg/m2), Intravenous,  Once, 1 of 4 cycles Dose modification: 300 mg/m2 (75 % of original dose 400 mg/m2, Cycle 1, Reason: Provider Judgment), 240 mg/m2 (60 % of original dose 400 mg/m2, Cycle 1, Reason: Change in LFTs) Administration: 440 mg (06/23/2020)  for chemotherapy treatment.      CANCER STAGING: Cancer Staging No matching staging information was found for the patient.  INTERVAL HISTORY:  Mr. John Mcbain Sr., a 69 y.o. male, returns for routine follow-up and to assess toxicity of cycle 1 of FOLFIRINOX.  Iori was last seen on 06/22/2020.  Due for cycle #2 of FOLFIRINOX next week.   Prior to his  first cycle of FOLFIRINOX, endorsed feeling bad with dull sharp aching abdominal pain that was relieved with narcotics.  Today, he is feeling "run down".  Endorses insomnia and anorexia.  Has worsening intermittent abdominal pain and left shoulder pain.   Taking 2 oxycodone every 4 hours routinely.  Having normal bowel movements.  No nausea or vomiting.  Has an occasional headache.  Denies fevers.  Had a day of "coughing, throat clearing" after chemo.  Has been drinking well but eating very little.    REVIEW OF SYSTEMS:  Review of Systems  Constitutional: Positive for appetite change, fatigue and unexpected weight change (Weight down 12 pounds since last visit). Negative for chills and fever.  HENT:  Negative.  Negative for hearing loss, lump/mass, mouth sores and nosebleeds.   Eyes: Negative.  Negative for eye problems.  Respiratory: Negative for cough, hemoptysis and shortness of breath.   Cardiovascular: Negative.  Negative for chest pain and leg swelling.  Gastrointestinal: Positive for abdominal pain. Negative for blood in stool, constipation, diarrhea, nausea and vomiting.  Endocrine: Negative.  Negative for hot flashes.  Genitourinary: Negative.  Negative for bladder incontinence, difficulty urinating, dysuria, frequency and hematuria.   Musculoskeletal: Negative.  Negative for back pain, flank pain, gait problem and myalgias.  Skin: Negative.  Negative for itching and rash.  Neurological: Negative.  Negative for dizziness, gait problem, headaches, light-headedness and numbness.  Hematological: Negative.  Negative for adenopathy.  Psychiatric/Behavioral: Negative for confusion. The patient is not nervous/anxious.     PAST MEDICAL/SURGICAL HISTORY:  Past Medical History:  Diagnosis Date  . Acute blood loss anemia   . Arthritis   . Benign essential HTN   . Cellulitis 07/20/2018  . Cellulitis in diabetic foot ([facility] [place]) 07/20/2018  . Diabetes mellitus without complication ([place])   . Diabetes mellitus, type II ([place]) 07/20/2018  . Dry gangrene ([place]) 07/20/2018  . Emesis   . Family history of cervical cancer   . Heart murmur   . Hyperglycemia   . Hypertension   . Joint pain   . Port-A-Cath in place 05/25/2020  . Unilateral complete  BKA, left, initial encounter [facility])    Past Surgical History:  Procedure Laterality Date  . AMPUTATION Left 08/02/2018   Procedure: LEFT AMPUTATION BELOW KNEE;  Surgeon: Marty Heck, MD;  Location: [facility];  Service: Vascular;  Laterality: Left;  . BILIARY STENT PLACEMENT N/A 06/11/2020   Procedure: BILIARY STENT PLACEMENT;  Surgeon: Rogene Houston, MD;  [facility];  Service: Endoscopy;  Laterality: N/A;  . ELBOW SURGERY     Bone graft to repair elbow  . ERCP N/A 06/11/2020   Procedure: ENDOSCOPIC RETROGRADE CHOLANGIOPANCREATOGRAPHY (ERCP);  Surgeon: Rogene Houston, MD;  [facility];  Service: Endoscopy;  Laterality: N/A;  . LOWER EXTREMITY ANGIOGRAPHY N/A 07/24/2018   Procedure: LOWER EXTREMITY ANGIOGRAPHY;  Surgeon: Marty Heck, MD;  Location: [facility];  Service: Cardiovascular;  Laterality: N/A;  . PERIPHERAL VASCULAR INTERVENTION  07/24/2018   Procedure: PERIPHERAL VASCULAR INTERVENTION;  Surgeon: Marty Heck, MD;  Location: [facility];  Service: Cardiovascular;;  . PORTACATH PLACEMENT Left 05/21/2020   Procedure: INSERTION PORT-A-CATH;  Surgeon: Aviva Signs, MD;  Location: [facility];  Service: General;  Laterality: Left;  . SPHINCTEROTOMY  06/11/2020   Procedure: SHORT SPHINCTEROTOMY;  Surgeon: Rogene Houston, MD;  [facility];  Service: Endoscopy;;  . TONSILLECTOMY    . TRANSMETATARSAL AMPUTATION Left 07/25/2018   Procedure:  LEFT GREAT TOE AMPUTATION;  Surgeon: Cephus Shelling, MD;  Location: [facility] OR;  Service: Vascular;  Laterality: Left;  . TRANSMETATARSAL AMPUTATION Left 07/30/2018   Procedure: TRANSMETATARSAL AMPUTATION;  Surgeon: Maeola Harman, MD;  Location: [facility] OR;  Service: Vascular;  Laterality: Left;    SOCIAL HISTORY:  Social History   Socioeconomic History  . Marital status: Married    Spouse name: Georgeann Oppenheim  . Number of children: 4  . Years of education: Not on file  . Highest  education level: Not on file  Occupational History  . Not on file  Tobacco Use  . Smoking status: Current Every Day Smoker    Packs/day: 0.50    Types: Cigarettes  . Smokeless tobacco: Former Neurosurgeon    Types: Snuff    Quit date: 04/21/1989  Vaping Use  . Vaping Use: Never used  Substance and Sexual Activity  . Alcohol use: Not Currently    Alcohol/week: 0.0 standard drinks    Comment: stopped 2 years ago  . Drug use: No  . Sexual activity: Not Currently  Other Topics Concern  . Not on file  Social History Narrative   Lives with wife Georgeann Oppenheim- married 33 years    4 children with wife. All grown- live close by       15 Cats and 5 Dogs      Enjoys: walking when hip lets him, camping, likes being outside       Diet: eats all food groups    Caffeine: diet sodas 3    Water: 6-8 cups daily -gallon       Wears seat belt    Does not drive- daughter drove him 21 years ago lost license   No smoke detectors due to wood burning stove    Weapons- Holiday representative          Social Determinants of Health   Financial Resource Strain: Medium Risk  . Difficulty of Paying Living Expenses: Somewhat hard  Food Insecurity: No Food Insecurity  . Worried About Programme researcher, broadcasting/film/video in the Last Year: Never true  . Ran Out of Food in the Last Year: Never true  Transportation Needs: No Transportation Needs  . Lack of Transportation (Medical): No  . Lack of Transportation (Non-Medical): No  Physical Activity: Inactive  . Days of Exercise per Week: 0 days  . Minutes of Exercise per Session: 0 min  Stress: No Stress Concern Present  . Feeling of Stress : Not at all  Social Connections: Moderately Integrated  . Frequency of Communication with Friends and Family: Twice a week  . Frequency of Social Gatherings with Friends and Family: Twice a week  . Attends Religious Services: 1 to 4 times per year  . Active Member of Clubs or Organizations: No  . Attends Banker Meetings: Never  . Marital  Status: Married  Catering manager Violence: Not At Risk  . Fear of Current or Ex-Partner: No  . Emotionally Abused: No  . Physically Abused: No  . Sexually Abused: No    FAMILY HISTORY:  Family History  Problem Relation Age of Onset  . Alcoholism Mother   . Alcoholism Father   . Cervical cancer Sister        dx. late 42s  . Cervical cancer Sister        dx. late 74s    CURRENT MEDICATIONS:  Current Outpatient Medications  Medication Sig Dispense Refill  . dexamethasone (DECADRON) 10 MG/ML injection Inject 10 mg into  the vein every 14 (fourteen) days. Dexamethasone 10mg  in NS 50 ml IVPB prior to chemotherapy administration     . fluorouracil CALGB 56213 in sodium chloride 0.9 % 150 mL Inject 2,400 mg/m2 into the vein over 48 hr.     . IRINOTECAN HCL IV Inject 280 mg/m2 into the vein every 14 (fourteen) days.     . OXALIPLATIN IV Inject 85 mg/m2 into the vein every 14 (fourteen) days.     . palonosetron (ALOXI) 0.25 MG/5ML injection Inject 0.25 mg into the vein every 14 (fourteen) days. Prior to chemotherapy administration     . acetaminophen (TYLENOL) 500 MG tablet Take 500-1,000 mg by mouth every 6 (six) hours as needed (for pain). (Patient not taking: Reported on 07/01/2020)    . aspirin EC 81 MG tablet Take 81 mg by mouth daily. Swallow whole. (Patient not taking: Reported on 07/01/2020)    . bisacodyl (DULCOLAX) 5 MG EC tablet Take 5 mg by mouth daily as needed for moderate constipation.  (Patient not taking: Reported on 07/01/2020)    . blood glucose meter kit and supplies KIT Dispense based on patient and insurance preference. Use up to four times daily as directed. (FOR ICD-9 250.00, 250.01). (Patient not taking: Reported on 07/01/2020) 1 each 0  . fosaprepitant (EMEND) 150 MG SOLR injection Inject 150 mg into the vein every 14 (fourteen) days. Prior to chemotherapy administration (Patient not taking: Reported on 07/01/2020)    . LEUCOVORIN CALCIUM IV Inject 400 mg/m2 into the vein  every 14 (fourteen) days. (Patient not taking: Reported on 07/01/2020)    . lidocaine-prilocaine (EMLA) cream Apply a small amount to port a cath site and cover with plastic wrap 1 hour prior to chemotherapy appointments (Patient not taking: Reported on 07/01/2020) 30 g 3  . metFORMIN (GLUCOPHAGE) 500 MG tablet Take 2 tablets (1,000 mg total) by mouth 2 (two) times daily with a meal. (Patient not taking: Reported on 07/01/2020) 60 tablet 3  . oxyCODONE (ROXICODONE) 5 MG immediate release tablet Take 2 tablets (10 mg total) by mouth every 6 (six) hours as needed for moderate pain or severe pain. 120 tablet 0  . potassium chloride SA (KLOR-CON) 20 MEQ tablet Take 2 tablets (40 mEq total) by mouth daily. (Patient not taking: Reported on 07/01/2020) 60 tablet 2  . prochlorperazine (COMPAZINE) 10 MG tablet Take 1 tablet (10 mg total) by mouth every 6 (six) hours as needed for nausea or vomiting. (Patient not taking: Reported on 07/01/2020) 30 tablet 2  . tamsulosin (FLOMAX) 0.4 MG CAPS capsule Take 1 capsule (0.4 mg total) by mouth at bedtime. (Patient not taking: Reported on 07/01/2020) 30 capsule 2   No current facility-administered medications for this visit.   Facility-Administered Medications Ordered in Other Visits  Medication Dose Route Frequency Provider Last Rate Last Admin  . 0.9 %  sodium chloride infusion   Intravenous Continuous Jacquelin Hawking, NP 20 mL/hr at 07/01/20 1102 New Bag at 07/01/20 1102    ALLERGIES:  Allergies  Allergen Reactions  . Tea Itching and Rash    PHYSICAL EXAM:  Performance status (ECOG): 1 - Symptomatic but completely ambulatory  Vitals:   07/01/20 0941  BP: 112/72  Pulse: 87  Resp: 18  Temp: (!) 96.7 F (35.9 C)  SpO2: 100%   Wt Readings from Last 3 Encounters:  07/01/20 131 lb 9.8 oz (59.7 kg)  06/23/20 141 lb 9.6 oz (64.2 kg)  06/22/20 142 lb 3.2 oz (64.5 kg)  Physical Exam Constitutional:      Appearance: Normal appearance.  HENT:     Head:  Normocephalic and atraumatic.  Eyes:     Pupils: Pupils are equal, round, and reactive to light.  Cardiovascular:     Rate and Rhythm: Normal rate and regular rhythm.     Heart sounds: Normal heart sounds. No murmur heard.   Pulmonary:     Effort: Pulmonary effort is normal.     Breath sounds: Normal breath sounds. No wheezing.  Abdominal:     General: Bowel sounds are normal. There is no distension.     Palpations: Abdomen is soft.     Tenderness: There is abdominal tenderness.  Musculoskeletal:        General: Normal range of motion.     Cervical back: Normal range of motion.  Skin:    General: Skin is warm and dry.     Findings: No rash.  Neurological:     Mental Status: He is alert and oriented to person, place, and time.  Psychiatric:        Judgment: Judgment normal.     LABORATORY DATA:  I have reviewed the labs as listed.  CBC Latest Ref Rng & Units 07/01/2020 06/22/2020 06/17/2020  WBC 4.0 - 10.5 K/uL 17.7(H) 15.5(H) 17.7(H)  Hemoglobin 13.0 - 17.0 g/dL 12.1(L) 10.5(L) 11.9(L)  Hematocrit 39 - 52 % 37.5(L) 32.7(L) 37.6(L)  Platelets 150 - 400 K/uL 377 330 525(H)   CMP Latest Ref Rng & Units 07/01/2020 06/22/2020 06/17/2020  Glucose 70 - 99 mg/dL 449(H) 374(H) 307(H)  BUN 8 - 23 mg/dL 9 6(L) 6(L)  Creatinine 0.61 - 1.24 mg/dL 0.58(L) 0.64 0.69  Sodium 135 - 145 mmol/L 135 134(L) 136  Potassium 3.5 - 5.1 mmol/L 3.8 3.2(L) 2.7(LL)  Chloride 98 - 111 mmol/L 96(L) 97(L) 97(L)  CO2 22 - 32 mmol/L $RemoveB'28 27 26  'reRuCTBI$ Calcium 8.9 - 10.3 mg/dL 8.8(L) 8.4(L) 8.5(L)  Total Protein 6.5 - 8.1 g/dL 6.9 6.6 7.1  Total Bilirubin 0.3 - 1.2 mg/dL 3.2(H) 5.0(H) 7.3(H)  Alkaline Phos 38 - 126 U/L 497(H) 477(H) 608(H)  AST 15 - 41 U/L 39 51(H) 56(H)  ALT 0 - 44 U/L 30 29 36    DIAGNOSTIC IMAGING:  I have independently reviewed the scans and discussed with the patient. MR 3D Recon At Scanner  Result Date: 06/09/2020 CLINICAL DATA:  69 year old male with history of pancreatic cancer  presenting with jaundice and weakness. EXAM: MRI ABDOMEN WITHOUT AND WITH CONTRAST (INCLUDING MRCP) TECHNIQUE: Multiplanar multisequence MR imaging of the abdomen was performed both before and after the administration of intravenous contrast. Heavily T2-weighted images of the biliary and pancreatic ducts were obtained, and three-dimensional MRCP images were rendered by post processing. CONTRAST:  22mL GADAVIST GADOBUTROL 1 MMOL/ML IV SOLN COMPARISON:  No prior abdominal MRI. CT the abdomen and pelvis 04/24/2020. FINDINGS: Lower chest: Unremarkable. Hepatobiliary: Innumerable T1 hypointense, T2 hyperintense, diffusion restricting centrally hypovascular peripherally enhancing lesions are scattered throughout all aspects of the hepatic parenchyma, increased in number and size compared to the prior CT examination, compatible with widespread metastatic disease. Among the largest of these lesions is a lesion in segment 8 of the liver (axial image 15 of series 21) measuring 2.2 x 1.5 cm. No intrahepatic biliary ductal dilatation. Common bile duct is dilated measuring 1.4 cm in the porta hepatis, obstructed by the large pancreatic mass (discussed below). Gallbladder is dilated with multiple small filling defects within the gallbladder compatible with gallstones. Gallbladder  wall does not appear thickened and there is no definite pericholecystic fluid or inflammatory changes to clearly indicate an associated cholecystitis at this time. Pancreas: Large mass in the head of the pancreas which is slightly low T1 signal intensity, slightly high T2 signal intensity and is generally hypovascular, estimated to measure approximately 2.4 x 2.1 x 3.1 cm (axial image 51 of series 23 and coronal image 26 of series 25). This appears to occlude the common bile duct. At this time, the main pancreatic duct is ectatic but not dilated measuring up to 3 mm in the body of the pancreas. Mild increased T2 signal intensity surrounding the pancreas,  which could reflect mild pancreatitis. No well-defined peripancreatic fluid collection to suggest pseudocyst at this time. Spleen:  Unremarkable. Adrenals/Urinary Tract: In the lateral aspect of the interpolar region of the right kidney (axial image 20 of series 4) there is a 1.4 cm lesion which is generally T1 hypointense (with mild heterogeneity in signal intensity), generally T2 hyperintense, with some internal T2 hypointensity, which corresponds to areas of internal nodular enhancement on post gadolinium images, concerning for a small renal neoplasm. This is encapsulated within Gerota's fascia and is well separated from the right renal vein which is widely patent. Left kidney and bilateral adrenal glands are normal in appearance. No hydroureteronephrosis in the visualized portions of the abdomen. Stomach/Bowel: Visualized portions are unremarkable. Vascular/Lymphatic: Aortic atherosclerosis, without evidence of aneurysm or dissection in the abdominal vasculature. Previously described pancreatic mass appears separated from both the superior mesenteric artery and vein, both of which are patent at this time. Splenic vein, splenoportal confluence and portal vein are also widely patent. Mildly enlarged portacaval lymph node measuring 1.1 cm in short axis (axial image 30 of series 23), which demonstrates diffusion restriction, suspicious for a metastatic node. Other:  Trace volume of perihepatic ascites. Musculoskeletal: No definite suspicious appearing osseous lesions are confidently identified on today's examination. IMPRESSION: 1. Progression of disease with enlargement of the primary lesion in the head of the pancreas, increased number and size of numerous metastatic lesions throughout the liver, and probable metastatic portacaval lymph node, as detailed above. 2. The pancreatic head mass is obstructing the common bile duct resulting in extrahepatic biliary ductal dilatation. 3. 1.4 cm lesion in the lateral aspect  of the interpolar region of the right kidney highly suspicious for primary renal cell carcinoma. This is encapsulated within Gerota's fascia, and is well separated from the right renal vein which is patent. 4. Cholelithiasis. 5. Additional incidental findings, as above. Electronically Signed   By: Vinnie Langton M.D.   On: 06/09/2020 17:00   DG ERCP  Result Date: 06/11/2020 CLINICAL DATA:  Metastatic pancreatic carcinoma and biliary obstruction. EXAM: ERCP TECHNIQUE: Multiple spot images obtained with the fluoroscopic device and submitted for interpretation post-procedure. COMPARISON:  MRI of the abdomen on 06/09/2020 FINDINGS: Imaging at the time of ERCP demonstrates cannulation of the common bile duct with contrast injection demonstrating severe dilatation of the common bile duct due to obstruction at the level of the pancreatic head. A permanent metallic stent was placed across the level of obstruction. IMPRESSION: Permanent biliary stent placement to treat biliary obstruction secondary to known pancreatic carcinoma. These images were submitted for radiologic interpretation only. Please see the procedural report for the amount of contrast and the fluoroscopy time utilized. Electronically Signed   By: Aletta Edouard M.D.   On: 06/11/2020 16:17   MR ABDOMEN MRCP W WO CONTAST  Result Date: 06/09/2020 CLINICAL DATA:  69 year old male with history of pancreatic cancer presenting with jaundice and weakness. EXAM: MRI ABDOMEN WITHOUT AND WITH CONTRAST (INCLUDING MRCP) TECHNIQUE: Multiplanar multisequence MR imaging of the abdomen was performed both before and after the administration of intravenous contrast. Heavily T2-weighted images of the biliary and pancreatic ducts were obtained, and three-dimensional MRCP images were rendered by post processing. CONTRAST:  69mL GADAVIST GADOBUTROL 1 MMOL/ML IV SOLN COMPARISON:  No prior abdominal MRI. CT the abdomen and pelvis 04/24/2020. FINDINGS: Lower chest:  Unremarkable. Hepatobiliary: Innumerable T1 hypointense, T2 hyperintense, diffusion restricting centrally hypovascular peripherally enhancing lesions are scattered throughout all aspects of the hepatic parenchyma, increased in number and size compared to the prior CT examination, compatible with widespread metastatic disease. Among the largest of these lesions is a lesion in segment 8 of the liver (axial image 15 of series 21) measuring 2.2 x 1.5 cm. No intrahepatic biliary ductal dilatation. Common bile duct is dilated measuring 1.4 cm in the porta hepatis, obstructed by the large pancreatic mass (discussed below). Gallbladder is dilated with multiple small filling defects within the gallbladder compatible with gallstones. Gallbladder wall does not appear thickened and there is no definite pericholecystic fluid or inflammatory changes to clearly indicate an associated cholecystitis at this time. Pancreas: Large mass in the head of the pancreas which is slightly low T1 signal intensity, slightly high T2 signal intensity and is generally hypovascular, estimated to measure approximately 2.4 x 2.1 x 3.1 cm (axial image 51 of series 23 and coronal image 26 of series 25). This appears to occlude the common bile duct. At this time, the main pancreatic duct is ectatic but not dilated measuring up to 3 mm in the body of the pancreas. Mild increased T2 signal intensity surrounding the pancreas, which could reflect mild pancreatitis. No well-defined peripancreatic fluid collection to suggest pseudocyst at this time. Spleen:  Unremarkable. Adrenals/Urinary Tract: In the lateral aspect of the interpolar region of the right kidney (axial image 20 of series 4) there is a 1.4 cm lesion which is generally T1 hypointense (with mild heterogeneity in signal intensity), generally T2 hyperintense, with some internal T2 hypointensity, which corresponds to areas of internal nodular enhancement on post gadolinium images, concerning for a  small renal neoplasm. This is encapsulated within Gerota's fascia and is well separated from the right renal vein which is widely patent. Left kidney and bilateral adrenal glands are normal in appearance. No hydroureteronephrosis in the visualized portions of the abdomen. Stomach/Bowel: Visualized portions are unremarkable. Vascular/Lymphatic: Aortic atherosclerosis, without evidence of aneurysm or dissection in the abdominal vasculature. Previously described pancreatic mass appears separated from both the superior mesenteric artery and vein, both of which are patent at this time. Splenic vein, splenoportal confluence and portal vein are also widely patent. Mildly enlarged portacaval lymph node measuring 1.1 cm in short axis (axial image 30 of series 23), which demonstrates diffusion restriction, suspicious for a metastatic node. Other:  Trace volume of perihepatic ascites. Musculoskeletal: No definite suspicious appearing osseous lesions are confidently identified on today's examination. IMPRESSION: 1. Progression of disease with enlargement of the primary lesion in the head of the pancreas, increased number and size of numerous metastatic lesions throughout the liver, and probable metastatic portacaval lymph node, as detailed above. 2. The pancreatic head mass is obstructing the common bile duct resulting in extrahepatic biliary ductal dilatation. 3. 1.4 cm lesion in the lateral aspect of the interpolar region of the right kidney highly suspicious for primary renal cell carcinoma. This is encapsulated within  Gerota's fascia, and is well separated from the right renal vein which is patent. 4. Cholelithiasis. 5. Additional incidental findings, as above. Electronically Signed   By: Vinnie Langton M.D.   On: 06/09/2020 17:00     ASSESSMENT:  1. 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 -Current active smoker, 1  pack/day for 47 years. No history of alcohol use. No family history of malignancy. -CT AP with contrast on 04/24/2020 shows 2.2 cm pancreatic head mass, small hypodense lesions throughout the bilateral lower lobes, predominantly in the right lower lobe, largest measuring 1.1 cm. 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 Mildly prominent lymph nodes within the upper abdomen above the pancreas head suspicious for metastatic lymphadenopathy. 5 mm pleural-based right lower lobe nodule. -CA 19-9 is 12. -Liver biopsy on 05/03/2020 consistent with metastatic adenocarcinoma, positive for CK7, CK20, CDX2. Differential diagnosis includes primary upper GI versus pancreaticobiliary. -PET scan on 05/19/2020 shows FDG avid had a pancreatic mass, multifocal low-attenuation lesions scattered throughout both lobes of the liver compatible with biopsy-proven liver metastasis. No FDG avid lymph nodes in the abdomen. No evidence of metastasis in the chest or bones. 2 small lung nodules within the right lower lobe measuring less than 5 mm. -ERCP and Wallstent placement on 06/11/2020.  2. Poorly controlled diabetes: -Hemoglobin A1c was 10.8 during hospitalization.  3. Abdominal pain: -Highly likely malignancy related. -He is currently on hydrocodone 5/325 1 to 2 tablets every 4 hours as needed. Patient reports that he still has pain but manageable.  4. Right kidney mass: -No further work-up needed if biopsy of liver lesion confirms pancreatic adenocarcinoma.   PLAN:  1.Metastatic pancreatic cancer to the liver: -Received cycle 1 of dose reduced FOLFIRINOX last week.  He did not receive Irinotecan.  -We reviewed his labs.  Total bili improved and is 3.2 today (5.0).  AST has normalized.  Alk phos remains elevated 497 (477). -CBC showed John count 17.7 and platelet count was normal.  2. Poorly controlled diabetes: -Blood sugar today is 449.  He is  not taking his Metformin. -CO2 is normal. -Recommend IV fluids today along with 10 units insulin subcu x1. Re-check blood sugar prior to leaving.  Spoke with  Dr. Delton Coombes, if we can get his blood sugars down to a reasonable level (around 200), we can initiate glipizide 5 mg daily to help control his blood sugars.  Anticipate initiating glipizide tomorrow 07/02/2020 after labs and IV fluids. -RTC tomorrow for additional IV fluids and insulin-will recheck labs  3. Abdominal pain: -He is taking oxycodone 10 mg every 6 hours. --He is requesting a refill today on his oxycodone. --Will check narcotic registry-ok to refill.     4. 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  No further work-up needed.  5. Genetic testing: -Blood drawn on 06/09/2020.  Follow-up on results.  6.  Hypokalemia:  -Potassium is 3.2 today. -He will continue potassium 40 mEq daily at home.  7.  Urinary frequency:  -Negative urinalysis  -Continue Flomax.   Orders placed this encounter:  No orders of the defined types were placed in this encounter.  Faythe Casa, NP 07/01/2020 12:47 PM  [place] [phone]

## 2020-06-30 NOTE — Progress Notes (Signed)
HPI:  Mr. [name] was previously seen in the [place] clinic due to a personal and family history of cancer and concerns regarding a hereditary predisposition to cancer. Please refer to our prior cancer genetics clinic note for more information regarding our discussion, assessment and recommendations, at the time. Mr. [name] recent genetic test results were disclosed to him, as were recommendations warranted by these results. These results and recommendations are discussed in more detail below.  CANCER HISTORY:  Oncology History  Pancreatic cancer metastasized to liver ([place])  05/21/2020 Initial Diagnosis   Pancreatic cancer metastasized to liver [facility])   05/28/2020 Genetic Testing   [facility]:     06/18/2020 Genetic Testing   Negative genetic testing:  No pathogenic variants detected on the [facility] Common Hereditary Cancers Panel + Pancreatic Cancer Panel. The report date is 06/18/2020.   The Common Hereditary Cancers Panel offered by [facility] includes sequencing and/or deletion duplication testing of the following 48 genes: APC, ATM, AXIN2, BARD1, BMPR1A, BRCA1, BRCA2, BRIP1, CDH1, CDK4, CDKN2A (p14ARF), CDKN2A (p16INK4a), CHEK2, CTNNA1, DICER1, EPCAM (Deletion/duplication testing only), GREM1 (promoter region deletion/duplication testing only), KIT, MEN1, MLH1, MSH2, MSH3, MSH6, MUTYH, NBN, NF1, NTHL1, PALB2, PDGFRA, PMS2, POLD1, POLE, PTEN, RAD50, RAD51C, RAD51D, RNF43, SDHB, SDHC, SDHD, SMAD4, SMARCA4. STK11, TP53, TSC1, TSC2, and VHL.  The following genes were evaluated for sequence changes only: SDHA and HOXB13 c.251G>A variant only. The Pancreatic Cancer Panel offered by [facility] includes sequencing and deletion/duplication analysis of the following 29 genes: APC, ATM, BMPR1A, BRCA1, BRCA2, CASR, CDK4, CDKN2A, CFTR, CPA1, CTRC, EPCAM, FANCC, MEN1, MLH1, MSH2, MSH6, NF1, PALB2, PALLD, PMS2, PRSS1, SMAD4, SPINK1, STK11, TP53, TSC1, TSC2, and VHL.   06/23/2020 -   Chemotherapy   The patient had palonosetron (ALOXI) injection 0.25 mg, 0.25 mg, Intravenous,  Once, 1 of 4 cycles Administration: 0.25 mg (06/23/2020) irinotecan (CAMPTOSAR) 280 mg in sodium chloride 0.9 % 500 mL chemo infusion, 150 mg/m2 = 280 mg, Intravenous,  Once, 0 of 3 cycles oxaliplatin (ELOXATIN) 115 mg in dextrose 5 % 500 mL chemo infusion, 63.75 mg/m2 = 115 mg (75 % of original dose 85 mg/m2), Intravenous,  Once, 1 of 4 cycles Dose modification: 63.75 mg/m2 (75 % of original dose 85 mg/m2, Cycle 1, Reason: Provider Judgment) Administration: 115 mg (06/23/2020) fosaprepitant (EMEND) 150 mg in sodium chloride 0.9 % 145 mL IVPB, 150 mg, Intravenous,  Once, 1 of 4 cycles Administration: 150 mg (06/23/2020) fluorouracil (ADRUCIL) 2,500 mg in sodium chloride 0.9 % 100 mL chemo infusion, 1,370 mg/m2 = 2,650 mg (60 % of original dose 2,400 mg/m2), Intravenous, 1 Day/Dose, 1 of 4 cycles Dose modification: 1,800 mg/m2 (75 % of original dose 2,400 mg/m2, Cycle 1, Reason: Provider Judgment), 1,440 mg/m2 (60 % of original dose 2,400 mg/m2, Cycle 1, Reason: Change in LFTs) Administration: 2,500 mg (06/23/2020) leucovorin 440 mg in dextrose 5 % 250 mL infusion, 240 mg/m2 = 440 mg (60 % of original dose 400 mg/m2), Intravenous,  Once, 1 of 4 cycles Dose modification: 300 mg/m2 (75 % of original dose 400 mg/m2, Cycle 1, Reason: Provider Judgment), 240 mg/m2 (60 % of original dose 400 mg/m2, Cycle 1, Reason: Change in LFTs) Administration: 440 mg (06/23/2020)  for chemotherapy treatment.      FAMILY HISTORY:  We obtained a detailed, 4-generation family history.  Significant diagnoses are listed below: Family History  Problem Relation Age of Onset  . Alcoholism Mother   . Alcoholism Father   . Cervical cancer Sister  dx. late 80s  . Cervical cancer Sister        dx. late 46s   Mr. [name] has three daughters and one son, ranging in age from 59 to 1. He has 7 brothers and 4 sisters - one  brother and one sister are maternal half-siblings. Two of his full sisters have had cervical cancer, both diagnosed in their 34s.   Mr. [name] mother died older than 72 and did not have cancer. He had two maternal aunts and one maternal uncle. One aunt died younger than 43, although he does not know the cause of her death. His maternal grandmother died at the age of 54 from old age, and his maternal grandfather died at the age of 37. There is no known cancer on the maternal side of the family, although he has limited information about these family members.  Mr. [name] father died when he was older than 64 and did not have cancer. Mr. [name] does not have any information about his paternal side of the family.  Mr. [name] is unaware of previous family history of genetic testing for hereditary cancer risks. Patient's maternal ancestors are of Native American descent, and paternal ancestors are of Native [place] and [place] descent. There is no reported Ashkenazi Jewish ancestry. There is no known consanguinity.  GENETIC TEST RESULTS: Genetic testing reported out on 06/18/2020 through the [facility] Common Hereditary Cancers Panel + Pancreatic Cancer Panel. No pathogenic variants were detected.   The Common Hereditary Cancers Panel offered by [facility] includes sequencing and/or deletion duplication testing of the following 48 genes: APC, ATM, AXIN2, BARD1, BMPR1A, BRCA1, BRCA2, BRIP1, CDH1, CDK4, CDKN2A (p14ARF), CDKN2A (p16INK4a), CHEK2, CTNNA1, DICER1, EPCAM (Deletion/duplication testing only), GREM1 (promoter region deletion/duplication testing only), KIT, MEN1, MLH1, MSH2, MSH3, MSH6, MUTYH, NBN, NF1, NTHL1, PALB2, PDGFRA, PMS2, POLD1, POLE, PTEN, RAD50, RAD51C, RAD51D, RNF43, SDHB, SDHC, SDHD, SMAD4, SMARCA4. STK11, TP53, TSC1, TSC2, and VHL.  The following genes were evaluated for sequence changes only: SDHA and HOXB13 c.251G>A variant only. The Pancreatic Cancer Panel offered by [facility]  includes sequencing and deletion/duplication analysis of the following 29 genes: APC, ATM, BMPR1A, BRCA1, BRCA2, CASR, CDK4, CDKN2A, CFTR, CPA1, CTRC, EPCAM, FANCC, MEN1, MLH1, MSH2, MSH6, NF1, PALB2, PALLD, PMS2, PRSS1, SMAD4, SPINK1, STK11, TP53, TSC1, TSC2, and VHL. The test report will be scanned into EPIC and located under the Molecular Pathology section of the Results Review tab.  A portion of the result report is included below for reference.     We discussed with Mr. Mells that because current genetic testing is not perfect, it is possible there may be a gene mutation in one of these genes that current testing cannot detect, but that chance is small.  We also discussed that there could be another gene that has not yet been discovered, or that we have not yet tested, that is responsible for the cancer diagnoses in the family. It is also possible there is a hereditary cause for the cancer in the family that Mr. Habibi did not inherit and therefore was not identified in his testing.  Therefore, it is important to remain in touch with cancer genetics in the future so that we can continue to offer Mr. Doty the most up to date genetic testing.   CANCER SCREENING RECOMMENDATIONS: Mr. Buss test result is considered negative (normal).  This means that we have not identified a hereditary cause for his personal and family history of cancer at this time. Most cancers happen by chance and  this negative test suggests that his personal and family history of cancer may fall into this category.    While reassuring, this does not definitively rule out a hereditary predisposition to cancer. It is still possible that there could be genetic mutations that are undetectable by current technology. There could be genetic mutations in genes that have not been tested or identified to increase cancer risk.  Therefore, it is recommended he continue to follow the cancer management and screening guidelines provided  by his oncology and primary healthcare provider.   An individual's cancer risk and medical management are not determined by genetic test results alone. Overall cancer risk assessment incorporates additional factors, including personal medical history, family history, and any available genetic information that may result in a personalized plan for cancer prevention and surveillance.  RECOMMENDATIONS FOR FAMILY MEMBERS:  Individuals in this family might be at some increased risk of developing cancer, over the general population risk, simply due to the family history of cancer.  We recommended women in this family have a yearly mammogram beginning at age 27, or 11 years younger than the earliest onset of cancer, an annual clinical breast exam, and perform monthly breast self-exams. Women in this family should also have a gynecological exam as recommended by their primary provider. All family members should be referred for colonoscopy starting at age 54.  FOLLOW-UP: Lastly, we discussed with Mr. Dunlevy that cancer genetics is a rapidly advancing field and it is possible that new genetic tests will be appropriate for him and/or his family members in the future. We encouraged him to remain in contact with cancer genetics on an annual basis so we can update his personal and family histories and let him know of advances in cancer genetics that may benefit this family.   Our contact number was provided. Mr. Bouse questions were answered to his satisfaction, and he knows he is welcome to call us at anytime with additional questions or concerns.   John Guy, MS, [facility] Genetic Counselor [email or web address] Phone: [phone]

## 2020-06-30 NOTE — Telephone Encounter (Signed)
LVM that his genetic test results are available and requested that he call back to discuss them.  

## 2020-07-01 ENCOUNTER — Inpatient Hospital Stay (HOSPITAL_BASED_OUTPATIENT_CLINIC_OR_DEPARTMENT_OTHER): Payer: Medicare Other | Admitting: Oncology

## 2020-07-01 ENCOUNTER — Inpatient Hospital Stay (HOSPITAL_COMMUNITY): Payer: Medicare Other

## 2020-07-01 ENCOUNTER — Other Ambulatory Visit: Payer: Self-pay

## 2020-07-01 VITALS — BP 112/72 | HR 87 | Temp 96.7°F | Resp 18 | Wt 131.6 lb

## 2020-07-01 DIAGNOSIS — C259 Malignant neoplasm of pancreas, unspecified: Secondary | ICD-10-CM

## 2020-07-01 DIAGNOSIS — R739 Hyperglycemia, unspecified: Secondary | ICD-10-CM

## 2020-07-01 DIAGNOSIS — C787 Secondary malignant neoplasm of liver and intrahepatic bile duct: Secondary | ICD-10-CM

## 2020-07-01 DIAGNOSIS — Z5111 Encounter for antineoplastic chemotherapy: Secondary | ICD-10-CM | POA: Diagnosis not present

## 2020-07-01 DIAGNOSIS — E860 Dehydration: Secondary | ICD-10-CM

## 2020-07-01 LAB — CBC WITH DIFFERENTIAL/PLATELET
Abs Immature Granulocytes: 0.09 10*3/uL — ABNORMAL HIGH (ref 0.00–0.07)
Basophils Absolute: 0.1 10*3/uL (ref 0.0–0.1)
Basophils Relative: 0 %
Eosinophils Absolute: 0.6 10*3/uL — ABNORMAL HIGH (ref 0.0–0.5)
Eosinophils Relative: 3 %
HCT: 37.5 % — ABNORMAL LOW (ref 39.0–52.0)
Hemoglobin: 12.1 g/dL — ABNORMAL LOW (ref 13.0–17.0)
Immature Granulocytes: 1 %
Lymphocytes Relative: 10 %
Lymphs Abs: 1.8 10*3/uL (ref 0.7–4.0)
MCH: 28.3 pg (ref 26.0–34.0)
MCHC: 32.3 g/dL (ref 30.0–36.0)
MCV: 87.8 fL (ref 80.0–100.0)
Monocytes Absolute: 1.3 10*3/uL — ABNORMAL HIGH (ref 0.1–1.0)
Monocytes Relative: 8 %
Neutro Abs: 13.9 10*3/uL — ABNORMAL HIGH (ref 1.7–7.7)
Neutrophils Relative %: 78 %
Platelets: 377 10*3/uL (ref 150–400)
RBC: 4.27 MIL/uL (ref 4.22–5.81)
RDW: 15.8 % — ABNORMAL HIGH (ref 11.5–15.5)
WBC: 17.7 10*3/uL — ABNORMAL HIGH (ref 4.0–10.5)
nRBC: 0 % (ref 0.0–0.2)

## 2020-07-01 LAB — COMPREHENSIVE METABOLIC PANEL
ALT: 30 U/L (ref 0–44)
AST: 39 U/L (ref 15–41)
Albumin: 2.6 g/dL — ABNORMAL LOW (ref 3.5–5.0)
Alkaline Phosphatase: 497 U/L — ABNORMAL HIGH (ref 38–126)
Anion gap: 11 (ref 5–15)
BUN: 9 mg/dL (ref 8–23)
CO2: 28 mmol/L (ref 22–32)
Calcium: 8.8 mg/dL — ABNORMAL LOW (ref 8.9–10.3)
Chloride: 96 mmol/L — ABNORMAL LOW (ref 98–111)
Creatinine, Ser: 0.58 mg/dL — ABNORMAL LOW (ref 0.61–1.24)
GFR calc non Af Amer: >60 mL/min (ref >60)
Glucose, Bld: 449 mg/dL — ABNORMAL HIGH (ref 70–99)
Potassium: 3.8 mmol/L (ref 3.5–5.1)
Sodium: 135 mmol/L (ref 135–145)
Total Bilirubin: 3.2 mg/dL — ABNORMAL HIGH (ref 0.3–1.2)
Total Protein: 6.9 g/dL (ref 6.5–8.1)

## 2020-07-01 LAB — GLUCOSE, RANDOM: Glucose, Bld: 376 mg/dL — ABNORMAL HIGH (ref 70–99)

## 2020-07-01 LAB — MAGNESIUM: Magnesium: 2 mg/dL (ref 1.7–2.4)

## 2020-07-01 MED ORDER — OXYCODONE HCL 5 MG PO TABS: 10.0000 mg | ORAL_TABLET | Freq: Four times a day (QID) | ORAL | 0 refills | Status: DC | PRN

## 2020-07-01 MED ORDER — HEPARIN SOD (PORK) LOCK FLUSH 100 UNIT/ML IV SOLN
500.0000 [IU] | Freq: Once | INTRAVENOUS | Status: AC
  Administered 2020-07-01: 500 [IU] via INTRAVENOUS

## 2020-07-01 MED ORDER — SODIUM CHLORIDE 0.9 % IV SOLN: INTRAVENOUS | Status: DC

## 2020-07-01 MED ORDER — GLIPIZIDE 5 MG PO TABS: 5.0000 mg | ORAL_TABLET | Freq: Every day | ORAL | 1 refills | Status: DC

## 2020-07-01 MED ORDER — INSULIN ASPART 100 UNIT/ML ~~LOC~~ SOLN
10.0000 [IU] | Freq: Once | SUBCUTANEOUS | Status: AC
  Administered 2020-07-01: 10 [IU] via SUBCUTANEOUS
  Filled 2020-07-01: qty 0.1

## 2020-07-01 NOTE — Progress Notes (Signed)
Re: hyperglycemia  Initial blood sugar-449  Repeat blood sugar after 1 L of normal saline and 10 units of NovoLog is 376.  Encouraged patient to hydrate to eat food this evening.  He is scheduled to return to clinic tomorrow in the morning for a repeat glucose and possible IV fluids.  He understands that if he starts to feel worse he is to go directly to the emergency room.   Faythe Casa, NP 07/01/2020 1:00 PM

## 2020-07-01 NOTE — Progress Notes (Signed)
Repeat blood glucose 376 - NP aware.  Okay to discharge pt at this time and he will return to clinic tomorrow for repeat lab work and possible IVF.  Pt is alert, in no distress.  Discharged ambulatory in stable condition in c/o daughter.

## 2020-07-02 ENCOUNTER — Inpatient Hospital Stay (HOSPITAL_COMMUNITY): Payer: Medicare Other

## 2020-07-02 DIAGNOSIS — R739 Hyperglycemia, unspecified: Secondary | ICD-10-CM

## 2020-07-02 DIAGNOSIS — Z5111 Encounter for antineoplastic chemotherapy: Secondary | ICD-10-CM | POA: Diagnosis not present

## 2020-07-02 LAB — BASIC METABOLIC PANEL
Anion gap: 10 (ref 5–15)
BUN: 8 mg/dL (ref 8–23)
CO2: 25 mmol/L (ref 22–32)
Calcium: 8.4 mg/dL — ABNORMAL LOW (ref 8.9–10.3)
Chloride: 100 mmol/L (ref 98–111)
Creatinine, Ser: 0.52 mg/dL — ABNORMAL LOW (ref 0.61–1.24)
GFR calc non Af Amer: >60 mL/min (ref >60)
Glucose, Bld: 351 mg/dL — ABNORMAL HIGH (ref 70–99)
Potassium: 3.1 mmol/L — ABNORMAL LOW (ref 3.5–5.1)
Sodium: 135 mmol/L (ref 135–145)

## 2020-07-02 NOTE — Progress Notes (Signed)
Nutrition Assessment:  Referral from [place], NP poor appetite, weight loss.   Acute add on today.  69 year old male with metastatic pancreatic cancer.  Past medical history of DM, HTN, smoker, unilateral complete BKA.  Patient receiving folfirinox.    Met with patient during infusion. Patient reports poor appetite for "awhile" especially since stent placed on 9/17. Patient reports that he does not eat much until in the evening, may snack some during the day.  Reports last night ate fish sicks/filets (4-5) with ketchup.  Likes hard boiled eggs, sandwichs, olives, peanut butter.  Reports his taste are "off" too. Drinks apple cider, punch and water  Patient reports that his wife prepares meals.  He reports that he has adequate food in the house and able to purchase food that he needs.       Medications: reviewed  Labs: reviewed  Anthropometrics:   Height: 71 inches Weight: 131 lb 9.8 oz on 10/7 UBW: per patient report 189-199 lb, unsure how long ago 04/24/20 148 lb 09/27/2018 136 lb  BMI: 18  11% weight loss in the last 2 1/2 months, significant   Estimated Energy Needs  Kcals: 1800-2100 Protein: 90-105 g Fluid: > 1.8 L  NUTRITION DIAGNOSIS: Unintentional weight loss related to cancer as evidenced by 11% weight loss and poor po intake   INTERVENTION:  Discussed importance of good nutrition and blood glucose control Encouraged nibbling/mini snack q 2 hours. RD and patient made "menu" for eating as patient wakes up around 5am.  Provided samples of boost glucose control and ensure max protein (lower sugar shakes).  Also wrote down other low sugar shake alternatives.   Encouraged non sugary beverages for better blood glucose control.  We discussed options for him.  Patient asking about diabetes medication.  NP will clarify with patient today.       MONITORING, EVALUATION, GOAL: weight trends, intake   NEXT VISIT: November 5th phone f/u  Shamika Pedregon B. Zenia Resides, [place], [place] Registered  Dietitian [phone] (mobile)

## 2020-07-02 NOTE — Progress Notes (Signed)
IVF not necessary today per Lorretta Harp, NP.  Pt instructed per NP to start taking glipizide in addition to his metformin as prescribed.  Discharged via wheelchair in c/o daughter in stable condition.

## 2020-07-07 ENCOUNTER — Encounter (HOSPITAL_COMMUNITY): Payer: Self-pay

## 2020-07-07 ENCOUNTER — Inpatient Hospital Stay (HOSPITAL_COMMUNITY): Payer: Medicare Other

## 2020-07-07 ENCOUNTER — Other Ambulatory Visit: Payer: Self-pay

## 2020-07-07 ENCOUNTER — Inpatient Hospital Stay (HOSPITAL_BASED_OUTPATIENT_CLINIC_OR_DEPARTMENT_OTHER): Payer: Medicare Other | Admitting: Hematology

## 2020-07-07 VITALS — BP 153/69 | HR 86 | Temp 96.8°F | Resp 18 | Wt 140.2 lb

## 2020-07-07 DIAGNOSIS — R739 Hyperglycemia, unspecified: Secondary | ICD-10-CM

## 2020-07-07 DIAGNOSIS — C787 Secondary malignant neoplasm of liver and intrahepatic bile duct: Secondary | ICD-10-CM

## 2020-07-07 DIAGNOSIS — Z95828 Presence of other vascular implants and grafts: Secondary | ICD-10-CM

## 2020-07-07 DIAGNOSIS — Z5111 Encounter for antineoplastic chemotherapy: Secondary | ICD-10-CM | POA: Diagnosis not present

## 2020-07-07 DIAGNOSIS — C259 Malignant neoplasm of pancreas, unspecified: Secondary | ICD-10-CM

## 2020-07-07 LAB — COMPREHENSIVE METABOLIC PANEL
ALT: 32 U/L (ref 0–44)
AST: 48 U/L — ABNORMAL HIGH (ref 15–41)
Albumin: 2.4 g/dL — ABNORMAL LOW (ref 3.5–5.0)
Alkaline Phosphatase: 516 U/L — ABNORMAL HIGH (ref 38–126)
Anion gap: 8 (ref 5–15)
BUN: 6 mg/dL — ABNORMAL LOW (ref 8–23)
CO2: 27 mmol/L (ref 22–32)
Calcium: 8.3 mg/dL — ABNORMAL LOW (ref 8.9–10.3)
Chloride: 99 mmol/L (ref 98–111)
Creatinine, Ser: 0.59 mg/dL — ABNORMAL LOW (ref 0.61–1.24)
GFR, Estimated: >60 mL/min (ref >60)
Glucose, Bld: 401 mg/dL — ABNORMAL HIGH (ref 70–99)
Potassium: 3.8 mmol/L (ref 3.5–5.1)
Sodium: 134 mmol/L — ABNORMAL LOW (ref 135–145)
Total Bilirubin: 2.3 mg/dL — ABNORMAL HIGH (ref 0.3–1.2)
Total Protein: 6.3 g/dL — ABNORMAL LOW (ref 6.5–8.1)

## 2020-07-07 LAB — CBC WITH DIFFERENTIAL/PLATELET
Abs Immature Granulocytes: 0.07 10*3/uL (ref 0.00–0.07)
Basophils Absolute: 0.1 10*3/uL (ref 0.0–0.1)
Basophils Relative: 1 %
Eosinophils Absolute: 0.4 10*3/uL (ref 0.0–0.5)
Eosinophils Relative: 2 %
HCT: 31.7 % — ABNORMAL LOW (ref 39.0–52.0)
Hemoglobin: 10.5 g/dL — ABNORMAL LOW (ref 13.0–17.0)
Immature Granulocytes: 1 %
Lymphocytes Relative: 12 %
Lymphs Abs: 1.8 10*3/uL (ref 0.7–4.0)
MCH: 29.2 pg (ref 26.0–34.0)
MCHC: 33.1 g/dL (ref 30.0–36.0)
MCV: 88.3 fL (ref 80.0–100.0)
Monocytes Absolute: 1.4 10*3/uL — ABNORMAL HIGH (ref 0.1–1.0)
Monocytes Relative: 9 %
Neutro Abs: 11.8 10*3/uL — ABNORMAL HIGH (ref 1.7–7.7)
Neutrophils Relative %: 75 %
Platelets: 311 10*3/uL (ref 150–400)
RBC: 3.59 MIL/uL — ABNORMAL LOW (ref 4.22–5.81)
RDW: 16.1 % — ABNORMAL HIGH (ref 11.5–15.5)
WBC: 15.6 10*3/uL — ABNORMAL HIGH (ref 4.0–10.5)
nRBC: 0 % (ref 0.0–0.2)

## 2020-07-07 LAB — GLUCOSE, RANDOM: Glucose, Bld: 307 mg/dL — ABNORMAL HIGH (ref 70–99)

## 2020-07-07 LAB — MAGNESIUM: Magnesium: 1.9 mg/dL (ref 1.7–2.4)

## 2020-07-07 MED ORDER — SODIUM CHLORIDE 0.9 % IV SOLN
2400.0000 mg/m2 | INTRAVENOUS | Status: DC
  Administered 2020-07-07: 4400 mg via INTRAVENOUS
  Filled 2020-07-07: qty 88

## 2020-07-07 MED ORDER — DEXTROSE 5 % IV SOLN: Freq: Once | INTRAVENOUS | Status: AC

## 2020-07-07 MED ORDER — SODIUM CHLORIDE 0.9 % IV SOLN: Freq: Once | INTRAVENOUS | Status: AC

## 2020-07-07 MED ORDER — SODIUM CHLORIDE 0.9% FLUSH: 10.0000 mL | INTRAVENOUS | Status: DC | PRN

## 2020-07-07 MED ORDER — HEPARIN SOD (PORK) LOCK FLUSH 100 UNIT/ML IV SOLN: 500.0000 [IU] | Freq: Once | INTRAVENOUS | Status: DC | PRN

## 2020-07-07 MED ORDER — SODIUM CHLORIDE 0.9 % IV SOLN
150.0000 mg | Freq: Once | INTRAVENOUS | Status: AC
  Administered 2020-07-07: 150 mg via INTRAVENOUS
  Filled 2020-07-07: qty 150

## 2020-07-07 MED ORDER — INSULIN ASPART 100 UNIT/ML ~~LOC~~ SOLN
10.0000 [IU] | Freq: Once | SUBCUTANEOUS | Status: AC
  Administered 2020-07-07: 10 [IU] via INTRAVENOUS
  Filled 2020-07-07: qty 0.1

## 2020-07-07 MED ORDER — SODIUM CHLORIDE 0.9 % IV SOLN
400.0000 mg/m2 | Freq: Once | INTRAVENOUS | Status: AC
  Administered 2020-07-07: 732 mg via INTRAVENOUS
  Filled 2020-07-07: qty 36.6

## 2020-07-07 MED ORDER — PALONOSETRON HCL INJECTION 0.25 MG/5ML
0.2500 mg | Freq: Once | INTRAVENOUS | Status: AC
  Administered 2020-07-07: 0.25 mg via INTRAVENOUS
  Filled 2020-07-07: qty 5

## 2020-07-07 MED ORDER — SODIUM CHLORIDE 0.9 % IV SOLN
10.0000 mg | Freq: Once | INTRAVENOUS | Status: AC
  Administered 2020-07-07: 10 mg via INTRAVENOUS
  Filled 2020-07-07: qty 10

## 2020-07-07 MED ORDER — OXALIPLATIN CHEMO INJECTION 100 MG/20ML
68.0000 mg/m2 | Freq: Once | INTRAVENOUS | Status: AC
  Administered 2020-07-07: 125 mg via INTRAVENOUS
  Filled 2020-07-07: qty 20

## 2020-07-07 MED ORDER — SODIUM CHLORIDE 0.9 % IV SOLN
90.0000 mg/m2 | Freq: Once | INTRAVENOUS | Status: AC
  Administered 2020-07-07: 160 mg via INTRAVENOUS
  Filled 2020-07-07: qty 8

## 2020-07-07 NOTE — Patient Instructions (Signed)
[facility] at [facility] °Discharge Instructions ° °You were seen today by Dr. Katragadda. He went over your recent results. You received your treatment today. Dr. Katragadda will see you back in 2 weeks for labs and follow up. ° ° °Thank you for choosing [facility] at [facility] to provide your oncology and hematology care.  To afford each patient quality time with our provider, please arrive at least 15 minutes before your scheduled appointment time.  ° °If you have a lab appointment with the Cancer Center please come in thru the Main Entrance and check in at the main information desk ° °You need to re-schedule your appointment should you arrive 10 or more minutes late.  We strive to give you quality time with our providers, and arriving late affects you and other patients whose appointments are after yours.  Also, if you no show three or more times for appointments you may be dismissed from the clinic at the providers discretion.     °Again, thank you for choosing [facility].  Our hope is that these requests will decrease the amount of time that you wait before being seen by our physicians.       °_____________________________________________________________ ° °Should you have questions after your visit to  Cancer Center, please contact our office at [phone] between the hours of 8:00 a.m. and 4:30 p.m.  Voicemails left after 4:00 p.m. will not be returned until the following business day.  For prescription refill requests, have your pharmacy contact our office and allow 72 hours.   ° °Cancer Center Support Programs:  ° °> Cancer Support Group  °2nd Tuesday of the month 1pm-2pm, Journey Room  ° ° °

## 2020-07-07 NOTE — Progress Notes (Signed)
Patient was assessed by Dr. Delton Coombes and labs have been reviewed.  Glucose 401, patient to get regular insulin 10 units IV times one today.  Primary RN to recheck glucose in 1 hour.  Bilirubin 2.3, Dr. Delton Coombes will dose reduce chemotherapy today.  Patient is okay to proceed with treatment today. Primary RN and pharmacy aware.

## 2020-07-07 NOTE — Progress Notes (Signed)
Per MD increasing Leucovorin and 5Fluorouracil to full dose today.  Confirmed oxaliplatin dose today at 68 mg/m2   T.O. Dr Jim Desanctis, RN/Verdie Barrows Ronnald Ramp, PharmD

## 2020-07-07 NOTE — Progress Notes (Signed)
Patient presents today for treatment and follow up visit with Dr. Delton Coombes. Labs pending. Vital signs within parameters for treatment. MAR reviewed. Patient denies any significant changes since his last treatment. Pain has abdominal pain he rates a 4/10. Chronic. Patient states his appetite and energy levels have increased.   Treatment given today per MD orders. Tolerated infusion without adverse affects. Vital signs stable. 5FU pump infusing per protocol. RUN noted on the screen and verified with the patient. No complaints at this time. Discharged from clinic ambulatory in stable condition. Alert and oriented x 3. F/U with [facility] as scheduled.

## 2020-07-07 NOTE — Progress Notes (Signed)
[name], [place] [address]   CLINIC:  Medical Oncology/Hematology  PCP:  Perlie Mayo, NP [address] 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 The report date is 06/18/2020.   The Common Hereditary Cancers Panel offered by [facility] includes sequencing and/or deletion duplication testing of the following 48 genes: APC, ATM, AXIN2, BARD1, BMPR1A, BRCA1, BRCA2, BRIP1, CDH1, CDK4, CDKN2A (p14ARF), CDKN2A (p16INK4a), CHEK2, CTNNA1, DICER1, EPCAM (Deletion/duplication testing only), GREM1 (promoter region deletion/duplication testing only), KIT, MEN1, MLH1, MSH2, MSH3, MSH6, MUTYH, NBN, NF1, NTHL1, PALB2, PDGFRA, PMS2, POLD1, POLE, PTEN, RAD50, RAD51C, RAD51D, RNF43, SDHB, SDHC, SDHD, SMAD4, SMARCA4. STK11, TP53, TSC1, TSC2, and VHL.  The following genes were evaluated for sequence changes only: SDHA and HOXB13 c.251G>A variant only. The Pancreatic Cancer Panel offered by [facility] includes sequencing and deletion/duplication analysis of the following 29 genes: APC, ATM, BMPR1A, BRCA1, BRCA2, CASR, CDK4, CDKN2A, CFTR, CPA1, CTRC, EPCAM, FANCC, MEN1, MLH1, MSH2, MSH6, NF1, PALB2, PALLD, PMS2, PRSS1, SMAD4, SPINK1, STK11, TP53, TSC1, TSC2, and VHL.   06/23/2020 -  Chemotherapy   The patient had palonosetron (ALOXI) injection 0.25 mg, 0.25 mg, Intravenous,  Once, 1 of 4 cycles Administration: 0.25 mg  (06/23/2020) irinotecan (CAMPTOSAR) 280 mg in sodium chloride 0.9 % 500 mL chemo infusion, 150 mg/m2 = 280 mg, Intravenous,  Once, 0 of 3 cycles oxaliplatin (ELOXATIN) 115 mg in dextrose 5 % 500 mL chemo infusion, 63.75 mg/m2 = 115 mg (75 % of original dose 85 mg/m2), Intravenous,  Once, 1 of 4 cycles Dose modification: 63.75 mg/m2 (75 % of original dose 85 mg/m2, Cycle 1, Reason: Provider Judgment) Administration: 115 mg (06/23/2020) fosaprepitant (EMEND) 150 mg in sodium chloride 0.9 % 145 mL IVPB, 150 mg, Intravenous,  Once, 1 of 4 cycles Administration: 150 mg (06/23/2020) fluorouracil (ADRUCIL) 2,500 mg in sodium chloride 0.9 % 100 mL chemo infusion, 1,370 mg/m2 = 2,650 mg (60 % of original dose 2,400 mg/m2), Intravenous, 1 Day/Dose, 1 of 4 cycles Dose modification: 1,800 mg/m2 (75 % of original dose 2,400 mg/m2, Cycle 1, Reason: Provider Judgment), 1,440 mg/m2 (60 % of original dose 2,400 mg/m2, Cycle 1, Reason: Change in LFTs) Administration: 2,500 mg (06/23/2020) leucovorin 440 mg in dextrose 5 % 250 mL infusion, 240 mg/m2 = 440 mg (60 % of original dose 400 mg/m2), Intravenous,  Once, 1 of 4 cycles Dose modification: 300 mg/m2 (75 % of original dose 400 mg/m2, Cycle 1, Reason: Provider Judgment), 240 mg/m2 (60 % of original dose 400 mg/m2, Cycle 1, Reason: Change in LFTs) Administration: 440 mg (06/23/2020)  for chemotherapy treatment.      CANCER STAGING: Cancer Staging No matching staging information was found for the patient.  INTERVAL HISTORY:  Mr. John Daft Sr., a 69 y.o. male, returns for routine follow-up and consideration for next cycle of chemotherapy. [name] was last seen on 06/22/2020.  Due for cycle #2 of FOLFIRINOX today.   Overall, he tells  me he has been feeling okay. He complains of ongoing abdominal pain since last week. He tolerated the previous treatment well and denies having N/V/D, numbness, tingling or cold sensitivity. He does not check his BG at home.  He  takes metformin and glipizide, though he forgot to take glipizide this morning. He is not taking potassium.  Overall, he feels ready for next cycle of chemo today.    REVIEW OF SYSTEMS:  Review of Systems  Constitutional: Positive for appetite change (50%) and fatigue (50%).  HENT:   Positive for trouble swallowing (d/t teeth issues).   Gastrointestinal: Positive for abdominal pain. Negative for diarrhea, nausea and vomiting.  Genitourinary: Positive for frequency.   Musculoskeletal: Positive for arthralgias (8/10 L shoulder pain).  Neurological: Positive for dizziness (occasional). Negative for numbness.  All other systems reviewed and are negative.   PAST MEDICAL/SURGICAL HISTORY:  Past Medical History:  Diagnosis Date  . Acute blood loss anemia   . Arthritis   . Benign essential HTN   . Cellulitis 07/20/2018  . Cellulitis in diabetic foot ([facility]) 07/20/2018  . Diabetes mellitus without complication ([facility])   . Diabetes mellitus, type II ([facility]) 07/20/2018  . Dry gangrene ([facility]) 07/20/2018  . Emesis   . Family history of cervical cancer   . Heart murmur   . Hyperglycemia   . Hypertension   . Joint pain   . Port-A-Cath in place 05/25/2020  . Unilateral complete BKA, left, initial encounter [facility])    Past Surgical History:  Procedure Laterality Date  . AMPUTATION Left 08/02/2018   Procedure: LEFT AMPUTATION BELOW KNEE;  Surgeon: Marty Heck, MD;  Location: [facility];  Service: Vascular;  Laterality: Left;  . BILIARY STENT PLACEMENT N/A 06/11/2020   Procedure: BILIARY STENT PLACEMENT;  Surgeon: Rogene Houston, MD;  [facility];  Service: Endoscopy;  Laterality: N/A;  . ELBOW SURGERY     Bone graft to repair elbow  . ERCP N/A 06/11/2020   Procedure: ENDOSCOPIC RETROGRADE CHOLANGIOPANCREATOGRAPHY (ERCP);  Surgeon: Rogene Houston, MD;  [facility];  Service: Endoscopy;  Laterality: N/A;  . LOWER EXTREMITY ANGIOGRAPHY N/A 07/24/2018   Procedure: LOWER  EXTREMITY ANGIOGRAPHY;  Surgeon: Marty Heck, MD;  Location: [facility];  Service: Cardiovascular;  Laterality: N/A;  . PERIPHERAL VASCULAR INTERVENTION  07/24/2018   Procedure: PERIPHERAL VASCULAR INTERVENTION;  Surgeon: Marty Heck, MD;  Location: [facility];  Service: Cardiovascular;;  . PORTACATH PLACEMENT Left 05/21/2020   Procedure: INSERTION PORT-A-CATH;  Surgeon: Aviva Signs, MD;  Location: [facility];  Service: General;  Laterality: Left;  . SPHINCTEROTOMY  06/11/2020   Procedure: SHORT SPHINCTEROTOMY;  Surgeon: Rogene Houston, MD;  [facility];  Service: Endoscopy;;  . TONSILLECTOMY    . TRANSMETATARSAL AMPUTATION Left 07/25/2018   Procedure: LEFT GREAT TOE AMPUTATION;  Surgeon: Marty Heck, MD;  Location: [facility];  Service: Vascular;  Laterality: Left;  . TRANSMETATARSAL AMPUTATION Left 07/30/2018   Procedure: TRANSMETATARSAL AMPUTATION;  Surgeon: Waynetta Sandy, MD;  Location: [facility];  Service: Vascular;  Laterality: Left;    SOCIAL HISTORY:  Social History   Socioeconomic History  . Marital status: Married    Spouse name: [name]  . Number of children: 4  . Years of education: Not on file  . Highest education level: Not on file  Occupational History  . Not on file  Tobacco Use  . Smoking status: Current Every Day Smoker  Packs/day: 0.50    Types: Cigarettes  . Smokeless tobacco: Former Systems developer    Types: Snuff    Quit date: 04/21/1989  Vaping Use  . Vaping Use: Never used  Substance and Sexual Activity  . Alcohol use: Not Currently    Alcohol/week: 0.0 standard drinks    Comment: stopped 2 years ago  . Drug use: No  . Sexual activity: Not Currently  Other Topics Concern  . Not on file  Social History Narrative   Lives with wife [name]- married 60 years    4 children with wife. All grown- live close by       15 Cats and 5 Dogs      Enjoys: walking when hip lets him, camping, likes being outside        Diet: eats all food groups    Caffeine: diet sodas 3    Water: 6-8 cups daily -gallon       Wears seat belt    Does not drive- daughter drove him 21 years ago lost license   No smoke detectors due to wood burning stove    Weapons- Sales executive          Social Determinants of Health   Financial Resource Strain: Medium Risk  . Difficulty of Paying Living Expenses: Somewhat hard  Food Insecurity: No Food Insecurity  . Worried About Charity fundraiser in the Last Year: Never true  . Ran Out of Food in the Last Year: Never true  Transportation Needs: No Transportation Needs  . Lack of Transportation (Medical): No  . Lack of Transportation (Non-Medical): No  Physical Activity: Inactive  . Days of Exercise per Week: 0 days  . Minutes of Exercise per Session: 0 min  Stress: No Stress Concern Present  . Feeling of Stress : Not at all  Social Connections: Moderately Integrated  . Frequency of Communication with Friends and Family: Twice a week  . Frequency of Social Gatherings with Friends and Family: Twice a week  . Attends Religious Services: 1 to 4 times per year  . Active Member of Clubs or Organizations: No  . Attends Archivist Meetings: Never  . Marital Status: Married  Human resources officer Violence: Not At Risk  . Fear of Current or Ex-Partner: No  . Emotionally Abused: No  . Physically Abused: No  . Sexually Abused: No    FAMILY HISTORY:  Family History  Problem Relation Age of Onset  . Alcoholism Mother   . Alcoholism Father   . Cervical cancer Sister        dx. late 59s  . Cervical cancer Sister        dx. late 70s    CURRENT MEDICATIONS:  Current Outpatient Medications  Medication Sig Dispense Refill  . acetaminophen (TYLENOL) 500 MG tablet Take 500-1,000 mg by mouth every 6 (six) hours as needed (for pain).     Marland Kitchen aspirin EC 81 MG tablet Take 81 mg by mouth daily. Swallow whole.    . bisacodyl (DULCOLAX) 5 MG EC tablet Take 5 mg by mouth daily as needed for  moderate constipation.     . blood glucose meter kit and supplies KIT Dispense based on patient and insurance preference. Use up to four times daily as directed. (FOR ICD-9 250.00, 250.01). 1 each 0  . dexamethasone (DECADRON) 10 MG/ML injection Inject 10 mg into the vein every 14 (fourteen) days. Dexamethasone 10mg  in NS 50 ml IVPB prior to chemotherapy administration     .  fluorouracil CALGB 53299 in sodium chloride 0.9 % 150 mL Inject 2,400 mg/m2 into the vein over 48 hr.     . fosaprepitant (EMEND) 150 MG SOLR injection Inject 150 mg into the vein every 14 (fourteen) days. Prior to chemotherapy administration     . glipiZIDE (GLUCOTROL) 5 MG tablet Take 1 tablet (5 mg total) by mouth daily before breakfast. 60 tablet 1  . IRINOTECAN HCL IV Inject 280 mg/m2 into the vein every 14 (fourteen) days.     Marland Kitchen LEUCOVORIN CALCIUM IV Inject 400 mg/m2 into the vein every 14 (fourteen) days.     . OXALIPLATIN IV Inject 85 mg/m2 into the vein every 14 (fourteen) days.     Marland Kitchen oxyCODONE (ROXICODONE) 5 MG immediate release tablet Take 2 tablets (10 mg total) by mouth every 6 (six) hours as needed for moderate pain or severe pain. 120 tablet 0  . palonosetron (ALOXI) 0.25 MG/5ML injection Inject 0.25 mg into the vein every 14 (fourteen) days. Prior to chemotherapy administration     . potassium chloride SA (KLOR-CON) 20 MEQ tablet Take 2 tablets (40 mEq total) by mouth daily. 60 tablet 2  . tamsulosin (FLOMAX) 0.4 MG CAPS capsule Take 1 capsule (0.4 mg total) by mouth at bedtime. 30 capsule 2  . lidocaine-prilocaine (EMLA) cream Apply a small amount to port a cath site and cover with plastic wrap 1 hour prior to chemotherapy appointments 30 g 3  . metFORMIN (GLUCOPHAGE) 500 MG tablet Take 2 tablets (1,000 mg total) by mouth 2 (two) times daily with a meal. 60 tablet 3  . prochlorperazine (COMPAZINE) 10 MG tablet Take 1 tablet (10 mg total) by mouth every 6 (six) hours as needed for nausea or vomiting. 30 tablet 2    No current facility-administered medications for this visit.    ALLERGIES:  Allergies  Allergen Reactions  . Tea Itching and Rash    PHYSICAL EXAM:  Performance status (ECOG): 1 - Symptomatic but completely ambulatory  Vitals:   07/07/20 0756  BP: (!) 153/69  Pulse: 86  Resp: 18  Temp: (!) 96.8 F (36 C)  SpO2: 100%   Wt Readings from Last 3 Encounters:  07/07/20 140 lb 3.2 oz (63.6 kg)  07/01/20 131 lb 9.8 oz (59.7 kg)  06/23/20 141 lb 9.6 oz (64.2 kg)   Physical Exam Vitals reviewed.  Constitutional:      Appearance: Normal appearance.  Chest:     Comments: Port-a-Cath in L chest Neurological:     General: No focal deficit present.     Mental Status: He is alert and oriented to person, place, and time.  Psychiatric:        Mood and Affect: Mood normal.        Behavior: Behavior normal.     LABORATORY DATA:  I have reviewed the labs as listed.  CBC Latest Ref Rng & Units 07/07/2020 07/01/2020 06/22/2020  WBC 4.0 - 10.5 K/uL 15.6(H) 17.7(H) 15.5(H)  Hemoglobin 13.0 - 17.0 g/dL 10.5(L) 12.1(L) 10.5(L)  Hematocrit 39 - 52 % 31.7(L) 37.5(L) 32.7(L)  Platelets 150 - 400 K/uL 311 377 330   CMP Latest Ref Rng & Units 07/07/2020 07/02/2020 07/01/2020  Glucose 70 - 99 mg/dL 401(H) 351(H) 376(H)  BUN 8 - 23 mg/dL 6(L) 8 -  Creatinine 0.61 - 1.24 mg/dL 0.59(L) 0.52(L) -  Sodium 135 - 145 mmol/L 134(L) 135 -  Potassium 3.5 - 5.1 mmol/L 3.8 3.1(L) -  Chloride 98 - 111 mmol/L 99 100 -  CO2 22 - 32 mmol/L 27 25 -  Calcium 8.9 - 10.3 mg/dL 8.3(L) 8.4(L) -  Total Protein 6.5 - 8.1 g/dL 6.3(L) - -  Total Bilirubin 0.3 - 1.2 mg/dL 2.3(H) - -  Alkaline Phos 38 - 126 U/L 516(H) - -  AST 15 - 41 U/L 48(H) - -  ALT 0 - 44 U/L 32 - -    DIAGNOSTIC IMAGING:  I have independently reviewed the scans and discussed with the patient. MR 3D Recon At Scanner  Result Date: 06/09/2020 CLINICAL DATA:  69 year old male with history of pancreatic cancer presenting with jaundice and  weakness. EXAM: MRI ABDOMEN WITHOUT AND WITH CONTRAST (INCLUDING MRCP) TECHNIQUE: Multiplanar multisequence MR imaging of the abdomen was performed both before and after the administration of intravenous contrast. Heavily T2-weighted images of the biliary and pancreatic ducts were obtained, and three-dimensional MRCP images were rendered by post processing. CONTRAST:  48mL GADAVIST GADOBUTROL 1 MMOL/ML IV SOLN COMPARISON:  No prior abdominal MRI. CT the abdomen and pelvis 04/24/2020. FINDINGS: Lower chest: Unremarkable. Hepatobiliary: Innumerable T1 hypointense, T2 hyperintense, diffusion restricting centrally hypovascular peripherally enhancing lesions are scattered throughout all aspects of the hepatic parenchyma, increased in number and size compared to the prior CT examination, compatible with widespread metastatic disease. Among the largest of these lesions is a lesion in segment 8 of the liver (axial image 15 of series 21) measuring 2.2 x 1.5 cm. No intrahepatic biliary ductal dilatation. Common bile duct is dilated measuring 1.4 cm in the porta hepatis, obstructed by the large pancreatic mass (discussed below). Gallbladder is dilated with multiple small filling defects within the gallbladder compatible with gallstones. Gallbladder wall does not appear thickened and there is no definite pericholecystic fluid or inflammatory changes to clearly indicate an associated cholecystitis at this time. Pancreas: Large mass in the head of the pancreas which is slightly low T1 signal intensity, slightly high T2 signal intensity and is generally hypovascular, estimated to measure approximately 2.4 x 2.1 x 3.1 cm (axial image 51 of series 23 and coronal image 26 of series 25). This appears to occlude the common bile duct. At this time, the main pancreatic duct is ectatic but not dilated measuring up to 3 mm in the body of the pancreas. Mild increased T2 signal intensity surrounding the pancreas, which could reflect mild  pancreatitis. No well-defined peripancreatic fluid collection to suggest pseudocyst at this time. Spleen:  Unremarkable. Adrenals/Urinary Tract: In the lateral aspect of the interpolar region of the right kidney (axial image 20 of series 4) there is a 1.4 cm lesion which is generally T1 hypointense (with mild heterogeneity in signal intensity), generally T2 hyperintense, with some internal T2 hypointensity, which corresponds to areas of internal nodular enhancement on post gadolinium images, concerning for a small renal neoplasm. This is encapsulated within Gerota's fascia and is well separated from the right renal vein which is widely patent. Left kidney and bilateral adrenal glands are normal in appearance. No hydroureteronephrosis in the visualized portions of the abdomen. Stomach/Bowel: Visualized portions are unremarkable. Vascular/Lymphatic: Aortic atherosclerosis, without evidence of aneurysm or dissection in the abdominal vasculature. Previously described pancreatic mass appears separated from both the superior mesenteric artery and vein, both of which are patent at this time. Splenic vein, splenoportal confluence and portal vein are also widely patent. Mildly enlarged portacaval lymph node measuring 1.1 cm in short axis (axial image 30 of series 23), which demonstrates diffusion restriction, suspicious for a metastatic node. Other:  Trace volume of perihepatic ascites.  Musculoskeletal: No definite suspicious appearing osseous lesions are confidently identified on today's examination. IMPRESSION: 1. Progression of disease with enlargement of the primary lesion in the head of the pancreas, increased number and size of numerous metastatic lesions throughout the liver, and probable metastatic portacaval lymph node, as detailed above. 2. The pancreatic head mass is obstructing the common bile duct resulting in extrahepatic biliary ductal dilatation. 3. 1.4 cm lesion in the lateral aspect of the interpolar region  of the right kidney highly suspicious for primary renal cell carcinoma. This is encapsulated within Gerota's fascia, and is well separated from the right renal vein which is patent. 4. Cholelithiasis. 5. Additional incidental findings, as above. Electronically Signed   By: Vinnie Langton M.D.   On: 06/09/2020 17:00   DG ERCP  Result Date: 06/11/2020 CLINICAL DATA:  Metastatic pancreatic carcinoma and biliary obstruction. EXAM: ERCP TECHNIQUE: Multiple spot images obtained with the fluoroscopic device and submitted for interpretation post-procedure. COMPARISON:  MRI of the abdomen on 06/09/2020 FINDINGS: Imaging at the time of ERCP demonstrates cannulation of the common bile duct with contrast injection demonstrating severe dilatation of the common bile duct due to obstruction at the level of the pancreatic head. A permanent metallic stent was placed across the level of obstruction. IMPRESSION: Permanent biliary stent placement to treat biliary obstruction secondary to known pancreatic carcinoma. These images were submitted for radiologic interpretation only. Please see the procedural report for the amount of contrast and the fluoroscopy time utilized. Electronically Signed   By: Aletta Edouard M.D.   On: 06/11/2020 16:17   MR ABDOMEN MRCP W WO CONTAST  Result Date: 06/09/2020 CLINICAL DATA:  69 year old male with history of pancreatic cancer presenting with jaundice and weakness. EXAM: MRI ABDOMEN WITHOUT AND WITH CONTRAST (INCLUDING MRCP) TECHNIQUE: Multiplanar multisequence MR imaging of the abdomen was performed both before and after the administration of intravenous contrast. Heavily T2-weighted images of the biliary and pancreatic ducts were obtained, and three-dimensional MRCP images were rendered by post processing. CONTRAST:  21mL GADAVIST GADOBUTROL 1 MMOL/ML IV SOLN COMPARISON:  No prior abdominal MRI. CT the abdomen and pelvis 04/24/2020. FINDINGS: Lower chest: Unremarkable. Hepatobiliary:  Innumerable T1 hypointense, T2 hyperintense, diffusion restricting centrally hypovascular peripherally enhancing lesions are scattered throughout all aspects of the hepatic parenchyma, increased in number and size compared to the prior CT examination, compatible with widespread metastatic disease. Among the largest of these lesions is a lesion in segment 8 of the liver (axial image 15 of series 21) measuring 2.2 x 1.5 cm. No intrahepatic biliary ductal dilatation. Common bile duct is dilated measuring 1.4 cm in the porta hepatis, obstructed by the large pancreatic mass (discussed below). Gallbladder is dilated with multiple small filling defects within the gallbladder compatible with gallstones. Gallbladder wall does not appear thickened and there is no definite pericholecystic fluid or inflammatory changes to clearly indicate an associated cholecystitis at this time. Pancreas: Large mass in the head of the pancreas which is slightly low T1 signal intensity, slightly high T2 signal intensity and is generally hypovascular, estimated to measure approximately 2.4 x 2.1 x 3.1 cm (axial image 51 of series 23 and coronal image 26 of series 25). This appears to occlude the common bile duct. At this time, the main pancreatic duct is ectatic but not dilated measuring up to 3 mm in the body of the pancreas. Mild increased T2 signal intensity surrounding the pancreas, which could reflect mild pancreatitis. No well-defined peripancreatic fluid collection to suggest pseudocyst at this  time. Spleen:  Unremarkable. Adrenals/Urinary Tract: In the lateral aspect of the interpolar region of the right kidney (axial image 20 of series 4) there is a 1.4 cm lesion which is generally T1 hypointense (with mild heterogeneity in signal intensity), generally T2 hyperintense, with some internal T2 hypointensity, which corresponds to areas of internal nodular enhancement on post gadolinium images, concerning for a small renal neoplasm. This is  encapsulated within Gerota's fascia and is well separated from the right renal vein which is widely patent. Left kidney and bilateral adrenal glands are normal in appearance. No hydroureteronephrosis in the visualized portions of the abdomen. Stomach/Bowel: Visualized portions are unremarkable. Vascular/Lymphatic: Aortic atherosclerosis, without evidence of aneurysm or dissection in the abdominal vasculature. Previously described pancreatic mass appears separated from both the superior mesenteric artery and vein, both of which are patent at this time. Splenic vein, splenoportal confluence and portal vein are also widely patent. Mildly enlarged portacaval lymph node measuring 1.1 cm in short axis (axial image 30 of series 23), which demonstrates diffusion restriction, suspicious for a metastatic node. Other:  Trace volume of perihepatic ascites. Musculoskeletal: No definite suspicious appearing osseous lesions are confidently identified on today's examination. IMPRESSION: 1. Progression of disease with enlargement of the primary lesion in the head of the pancreas, increased number and size of numerous metastatic lesions throughout the liver, and probable metastatic portacaval lymph node, as detailed above. 2. The pancreatic head mass is obstructing the common bile duct resulting in extrahepatic biliary ductal dilatation. 3. 1.4 cm lesion in the lateral aspect of the interpolar region of the right kidney highly suspicious for primary renal cell carcinoma. This is encapsulated within Gerota's fascia, and is well separated from the right renal vein which is patent. 4. Cholelithiasis. 5. Additional incidental findings, as above. Electronically Signed   By: Vinnie Langton M.D.   On: 06/09/2020 17:00     ASSESSMENT:  1. 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 -Current active smoker, 1 pack/day for 47 years. No history  of alcohol use. No family history of malignancy. -CT [facility] with contrast on 04/24/2020 shows 2.2 cm pancreatic head mass, small hypodense lesions throughout the bilateral lower lobes, predominantly in the right lower lobe, largest measuring 1.1 cm. 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 Mildly prominent lymph nodes within the upper abdomen above the pancreas head suspicious for metastatic lymphadenopathy. 5 mm pleural-based right lower lobe nodule. -CA 19-9 is 12. -Liver biopsy on 05/03/2020 consistent with metastatic adenocarcinoma, positive for CK7, CK20, CDX2. Differential diagnosis includes primary upper GI versus pancreaticobiliary. -PET scan on 05/19/2020 shows FDG avid had a pancreatic mass, multifocal low-attenuation lesions scattered throughout both lobes of the liver compatible with biopsy-proven liver metastasis. No FDG avid lymph nodes in the abdomen. No evidence of metastasis in the chest or bones. 2 small lung nodules within the right lower lobe measuring less than 5 mm. -ERCP and Wallstent placement on 06/11/2020.  2. Poorly controlled diabetes: -Hemoglobin A1c was 10.8 during hospitalization.  3. Abdominal pain: -Highly likely malignancy related. -He is currently on hydrocodone 5/325 1 to 2 tablets every 4 hours as needed. Patient reports that he still has pain but manageable.  4. Right kidney mass: -No further work-up needed if biopsy of liver lesion confirms pancreatic adenocarcinoma.   PLAN:  1.Metastatic pancreatic cancer to the liver: -He tolerated first cycle reasonably well. -Bilirubin improved  to 2.3.  AST is mildly elevated at 48.  Albumin is low at 2.4.  CBC shows white count and platelet count is adequate. -He will proceed with cycle 2 today.  I will cut back oxaliplatin by 20%.  I will start on irinotecan at 60% dose.  5-FU will be full dose. -Reevaluate in 1 week for possible fluids and  electrolytes. -RTC in 2 weeks with labs and treatment.  2. Poorly controlled diabetes: -Continue Metformin 1000 mg twice daily.  We have started him on glipizide 5 mg in the mornings.  Today he did not take it.  His blood sugar was high at 401.  We have given insulin twice to bring it down.  3. Abdominal pain: -Continue oxycodone 10 mg every 6 hours as needed.  4. 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  No work-up needed.  5. Genetic testing: -Invitae testing was negative.  6. Hypokalemia: -Potassium today is 3.8.  Continue potassium 40 mEq daily at home.  7.  Urinary frequency: -Continue Flomax 0.4 mg at bedtime.   Orders placed this encounter:  No orders of the defined types were placed in this encounter.    Derek Jack, MD [place] [phone]   I, Milinda Antis, am acting as a scribe for Dr. Sanda Linger.  I, Derek Jack MD, have reviewed the above documentation for accuracy and completeness, and I agree with the above.

## 2020-07-07 NOTE — Patient Instructions (Signed)
[facility] at [facility] Discharge Instructions  Labs drawn from portcath today   Thank you for choosing [facility] at [facility] to provide your oncology and hematology care.  To afford each patient quality time with our provider, please arrive at least 15 minutes before your scheduled appointment time.   If you have a lab appointment with the Cancer Center please come in thru the Main Entrance and check in at the main information desk.  You need to re-schedule your appointment should you arrive 10 or more minutes late.  We strive to give you quality time with our providers, and arriving late affects you and other patients whose appointments are after yours.  Also, if you no show three or more times for appointments you may be dismissed from the clinic at the providers discretion.     Again, thank you for choosing [facility].  Our hope is that these requests will decrease the amount of time that you wait before being seen by our physicians.       _____________________________________________________________  Should you have questions after your visit to [facility], please contact our office at [phone] and follow the prompts.  Our office hours are 8:00 a.m. and 4:30 p.m. Monday - Friday.  Please note that voicemails left after 4:00 p.m. may not be returned until the following business day.  We are closed weekends and major holidays.  You do have access to a nurse 24-7, just call the main number to the clinic [phone] and do not press any options, hold on the line and a nurse will answer the phone.    For prescription refill requests, have your pharmacy contact our office and allow 72 hours.    Due to Covid, you will need to wear a mask upon entering the hospital. If you do not have a mask, a mask will be given to you at the Main Entrance upon arrival. For doctor visits, patients may have 1 support person age 18  or older with them. For treatment visits, patients can not have anyone with them due to social distancing guidelines and our immunocompromised population.     

## 2020-07-07 NOTE — Patient Instructions (Signed)
[facility] Discharge Instructions for Patients Receiving Chemotherapy  Today you received the following chemotherapy agents   To help prevent nausea and vomiting after your treatment, we encourage you to take your nausea medication   If you develop nausea and vomiting that is not controlled by your nausea medication, call the clinic.   BELOW ARE SYMPTOMS THAT SHOULD BE REPORTED IMMEDIATELY:  *FEVER GREATER THAN 100.5 F  *CHILLS WITH OR WITHOUT FEVER  NAUSEA AND VOMITING THAT IS NOT CONTROLLED WITH YOUR NAUSEA MEDICATION  *UNUSUAL SHORTNESS OF BREATH  *UNUSUAL BRUISING OR BLEEDING  TENDERNESS IN MOUTH AND THROAT WITH OR WITHOUT PRESENCE OF ULCERS  *URINARY PROBLEMS  *BOWEL PROBLEMS  UNUSUAL RASH Items with * indicate a potential emergency and should be followed up as soon as possible.  Feel free to call the clinic should you have any questions or concerns. The clinic phone number is [phone].  Please show the CHEMO ALERT CARD at check-in to the Emergency Department and triage nurse.   

## 2020-07-08 ENCOUNTER — Inpatient Hospital Stay (HOSPITAL_COMMUNITY): Payer: Medicare Other

## 2020-07-08 ENCOUNTER — Other Ambulatory Visit (HOSPITAL_COMMUNITY): Payer: Self-pay | Admitting: *Deleted

## 2020-07-08 ENCOUNTER — Encounter (HOSPITAL_COMMUNITY): Payer: Self-pay | Admitting: *Deleted

## 2020-07-08 VITALS — BP 136/66 | HR 79 | Temp 97.2°F | Resp 18

## 2020-07-08 DIAGNOSIS — Z5111 Encounter for antineoplastic chemotherapy: Secondary | ICD-10-CM | POA: Diagnosis not present

## 2020-07-08 DIAGNOSIS — R739 Hyperglycemia, unspecified: Secondary | ICD-10-CM

## 2020-07-08 LAB — GLUCOSE, RANDOM: Glucose, Bld: 549 mg/dL (ref 70–99)

## 2020-07-08 LAB — GLUCOSE, CAPILLARY: Glucose-Capillary: 341 mg/dL — ABNORMAL HIGH (ref 70–99)

## 2020-07-08 MED ORDER — BLOOD GLUCOSE MONITOR KIT: PACK | 0 refills | Status: DC

## 2020-07-08 MED ORDER — INSULIN ASPART 100 UNIT/ML ~~LOC~~ SOLN
10.0000 [IU] | Freq: Once | SUBCUTANEOUS | Status: AC
  Administered 2020-07-08: 10 [IU] via INTRAVENOUS
  Filled 2020-07-08: qty 0.1

## 2020-07-08 MED ORDER — SODIUM CHLORIDE 0.9 % IV SOLN: INTRAVENOUS | Status: AC

## 2020-07-08 NOTE — Progress Notes (Signed)
I received a phone call from EMS on patient's wife's phone.  They were called out to the home because patient's port that was accessed for chemotherapy had accidentally been deaccessed.  His wife said it happend around 1130 and only his shirt was wet.  I instructed EMS how to handle the chemo itself, and his clothing.  Patient had already shut off the infusion pump.  I have notified Dr. Delton Coombes and he wants patient to come back to the cancer center to have port reaccessed and chemo resumed.    Patient aware and is going to head this way.  Primary RN and pharmacy aware.

## 2020-07-08 NOTE — Progress Notes (Signed)
Message sent to [place] pertaining to glucose levels and education.

## 2020-07-08 NOTE — Progress Notes (Signed)
Pt presents to clinic today after port a cath needle was inadvertently dislodged from port a cath site while home ambulatory pump infusing. The pump was immediately stopped at home by pt and family. Pt's family called EMS after this happened, and per family EMS personnel obtained a CBG which read pt's blood glucose as greater than 600.  Pt's family contacted the clinic and were advised for pt to come in to address hyperglycemia, and to reconnect the ambulatory pump to complete home infusion of 5FU.     CRITICAL VALUE ALERT  Critical Value:  Blood glucose 549  Date & Time Notied:  07/08/2020 at 1501  Provider Notified: Dr. Delton Coombes  Orders Received/Actions taken: give NS 1L x 2 over, each over 1 hour and insulin 10 units IV; recheck blood sugar 1 hour after insulin administration.   1600:  Dr Delton Coombes notified of CBG of 341.  Orders rec'd to give additional dose of Novolog 10 units IV x1 and may discharge home after administration. After line for 5FU home infusion changed out by pharmacy, pump was reconnected to pt and restarted to infuse the remainder of chemotherapy.  Instructed to return to clinic tomorrow as scheduled for pump d/c and port flush/deaccess.  Pt verbalizes understanding.  Discharged home via wheelchair in c/o family in stable condition.

## 2020-07-08 NOTE — Progress Notes (Signed)
CBG 341 at 1545

## 2020-07-09 ENCOUNTER — Encounter (HOSPITAL_COMMUNITY): Payer: Self-pay

## 2020-07-09 ENCOUNTER — Inpatient Hospital Stay (HOSPITAL_COMMUNITY): Payer: Medicare Other

## 2020-07-09 VITALS — BP 135/69 | HR 99 | Temp 98.7°F | Resp 18

## 2020-07-09 DIAGNOSIS — Z95828 Presence of other vascular implants and grafts: Secondary | ICD-10-CM

## 2020-07-09 DIAGNOSIS — C259 Malignant neoplasm of pancreas, unspecified: Secondary | ICD-10-CM

## 2020-07-09 DIAGNOSIS — Z5111 Encounter for antineoplastic chemotherapy: Secondary | ICD-10-CM | POA: Diagnosis not present

## 2020-07-09 MED ORDER — HEPARIN SOD (PORK) LOCK FLUSH 100 UNIT/ML IV SOLN
500.0000 [IU] | Freq: Once | INTRAVENOUS | Status: AC | PRN
  Administered 2020-07-09: 500 [IU]

## 2020-07-09 MED ORDER — SODIUM CHLORIDE 0.9% FLUSH
10.0000 mL | INTRAVENOUS | Status: DC | PRN
  Administered 2020-07-09: 10 mL

## 2020-07-09 NOTE — Patient Instructions (Signed)
[place] at [facility] Discharge Instructions  5FU pump discontinued today with portacath flushed per protocol. Follow-up as scheduled   Thank you for choosing [place] at [facility] to provide your oncology and hematology care.  To afford each patient quality time with our provider, please arrive at least 15 minutes before your scheduled appointment time.   If you have a lab appointment with the [facility] please come in thru the Main Entrance and check in at the main information desk.  You need to re-schedule your appointment should you arrive 10 or more minutes late.  We strive to give you quality time with our providers, and arriving late affects you and other patients whose appointments are after yours.  Also, if you no show three or more times for appointments you may be dismissed from the clinic at the providers discretion.     Again, thank you for choosing [facility].  Our hope is that these requests will decrease the amount of time that you wait before being seen by our physicians.       _____________________________________________________________  Should you have questions after your visit to [facility], please contact our office at [phone] and follow the prompts.  Our office hours are 8:00 a.m. and 4:30 p.m. Monday - Friday.  Please note that voicemails left after 4:00 p.m. may not be returned until the following business day.  We are closed weekends and major holidays.  You do have access to a nurse 24-7, just call the main number to the clinic [phone] and do not press any options, hold on the line and a nurse will answer the phone.    For prescription refill requests, have your pharmacy contact our office and allow 72 hours.    Due to Covid, you will need to wear a mask upon entering the hospital. If you do not have a mask, a mask will be given to you at the Main Entrance upon arrival. For  doctor visits, patients may have 1 support person age 55 or older with them. For treatment visits, patients can not have anyone with them due to social distancing guidelines and our immunocompromised population.

## 2020-07-09 NOTE — Progress Notes (Signed)
John Tropea Sr. tolerated 5FU pump well without complaints or incident. 5FU pump discontinued with portacath flushed easily per protocol then de-accessed. VSS Pt discharged via wheelchair in satisfactory condition accompanied by family member 

## 2020-07-12 NOTE — Progress Notes (Signed)
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 The report date is 06/18/2020.   The Common Hereditary Cancers Panel offered by [facility] includes sequencing and/or deletion duplication testing of the following 48 genes: APC, ATM, AXIN2, BARD1, BMPR1A, BRCA1, BRCA2, BRIP1, CDH1, CDK4, CDKN2A (p14ARF), CDKN2A (p16INK4a), CHEK2, CTNNA1, DICER1, EPCAM (Deletion/duplication testing only), GREM1 (promoter region deletion/duplication testing only), KIT, MEN1, MLH1, MSH2, MSH3, MSH6, MUTYH, NBN, NF1, NTHL1, PALB2, PDGFRA, PMS2, POLD1, POLE, PTEN, RAD50, RAD51C, RAD51D, RNF43, SDHB, SDHC, SDHD, SMAD4, SMARCA4. STK11, TP53, TSC1, TSC2, and VHL.  The following genes were evaluated for sequence changes only: SDHA and HOXB13 c.251G>A variant only. The Pancreatic Cancer Panel offered by [facility] includes sequencing and deletion/duplication analysis of the following 29 genes: APC, ATM, BMPR1A, BRCA1, BRCA2, CASR, CDK4, CDKN2A, CFTR, CPA1, CTRC, EPCAM, FANCC, MEN1, MLH1, MSH2, MSH6, NF1, PALB2, PALLD, PMS2, PRSS1, SMAD4, SPINK1, STK11, TP53, TSC1, TSC2, and VHL.   06/23/2020 -  Chemotherapy   The patient had palonosetron (ALOXI) injection 0.25 mg, 0.25 mg, Intravenous,  Once, 2 of 4 cycles Administration: 0.25 mg  (06/23/2020), 0.25 mg (07/07/2020) irinotecan (CAMPTOSAR) 160 mg in sodium chloride 0.9 % 500 mL chemo infusion, 90 mg/m2 = 160 mg (60 % of original dose 150 mg/m2), Intravenous,  Once, 1 of 3 cycles Dose modification: 90 mg/m2 (60 % of original dose 150 mg/m2, Cycle 2, Reason: Change in LFTs) Administration: 160 mg (07/07/2020) oxaliplatin (ELOXATIN) 115 mg in dextrose 5 % 500 mL chemo infusion, 63.75 mg/m2 = 115 mg (75 % of original dose 85 mg/m2), Intravenous,  Once, 2 of 4 cycles Dose modification: 63.75 mg/m2 (75 % of original dose 85 mg/m2, Cycle 1, Reason: Provider Judgment), 68 mg/m2 (80 % of original dose 85 mg/m2, Cycle 2, Reason: Provider Judgment) Administration: 115 mg (06/23/2020), 125 mg (07/07/2020) fosaprepitant (EMEND) 150 mg in sodium chloride 0.9 % 145 mL IVPB, 150 mg, Intravenous,  Once, 2 of 4 cycles Administration: 150 mg (06/23/2020), 150 mg (07/07/2020) fluorouracil (ADRUCIL) 2,500 mg in sodium chloride 0.9 % 100 mL chemo infusion, 1,370 mg/m2 = 2,650 mg (60 % of original dose 2,400 mg/m2), Intravenous, 1 Day/Dose, 2 of 4 cycles Dose modification: 1,800 mg/m2 (75 % of original dose 2,400 mg/m2, Cycle 1, Reason: Provider Judgment), 1,440 mg/m2 (60 % of original dose 2,400 mg/m2, Cycle 1, Reason: Change in LFTs) Administration: 2,500 mg (06/23/2020), 4,400 mg (07/07/2020) leucovorin 440 mg in dextrose 5 % 250 mL infusion, 240 mg/m2 = 440 mg (60 % of original dose 400 mg/m2), Intravenous,  Once, 2 of 4 cycles Dose modification: 300 mg/m2 (75 % of original dose 400 mg/m2, Cycle 1, Reason: Provider Judgment), 240 mg/m2 (60 % of original dose 400 mg/m2, Cycle 1, Reason: Change in LFTs) Administration: 440 mg (06/23/2020), 732 mg (07/07/2020)  for chemotherapy treatment.  CANCER STAGING: Cancer Staging No matching staging information was found for the patient.  INTERVAL HISTORY:  [name], a 69 y.o. male, returns for routine follow-up and possible IV fluids  secondary to poor oral intake.  He last received chemotherapy (cycle 2 FOLFIRINOX dose reduced) on 07/07/2020.  Overall, he tells me he has been feeling tired and drained.Marland Kitchen He complains of ongoing RUQ abdominal pain X 2 weeks. He tolerated the previous treatment well and denies having N/V/D, numbness, tingling or cold sensitivity. He does not check his BG at home.  His appetite has improved some.  He is drinking supplements throughout the day.  Has chronic constipation.  Weight is overall stable plus or minus a few pounds each visit.  He has met with our dietitian and been offered recommendations to maintain his weight.  Patient has misplaced all of his medications.  He has not taken his Metformin or glipizide in 2 days.   REVIEW OF SYSTEMS:  Review of Systems  Constitutional: Positive for appetite change and fatigue. Negative for chills and fever.  HENT:  Negative.  Negative for hearing loss, lump/mass, mouth sores and nosebleeds.   Eyes: Negative.  Negative for eye problems.  Respiratory: Negative for cough, hemoptysis and shortness of breath.   Cardiovascular: Negative.  Negative for chest pain and leg swelling.  Gastrointestinal: Positive for abdominal pain. Negative for blood in stool, constipation, diarrhea, nausea and vomiting.  Endocrine: Negative.  Negative for hot flashes.  Genitourinary: Negative.  Negative for bladder incontinence, difficulty urinating, dysuria, frequency and hematuria.   Musculoskeletal: Positive for gait problem. Negative for back pain, flank pain and myalgias.       Right shoulder pain  Skin: Negative.  Negative for itching and rash.  Neurological: Positive for gait problem. Negative for dizziness, headaches, light-headedness and numbness.  Hematological: Negative.  Negative for adenopathy.  Psychiatric/Behavioral: Negative for confusion. The patient is not nervous/anxious.     PAST MEDICAL/SURGICAL HISTORY:  Past Medical History:  Diagnosis Date  . Acute  blood loss anemia   . Arthritis   . Benign essential HTN   . Cellulitis 07/20/2018  . Cellulitis in diabetic foot ([place]) 07/20/2018  . Diabetes mellitus without complication ([place])   . Diabetes mellitus, type II ([place]) 07/20/2018  . Dry gangrene ([place]) 07/20/2018  . Emesis   . Family history of cervical cancer   . Heart murmur   . Hyperglycemia   . Hypertension   . Joint pain   . Port-A-Cath in place 05/25/2020  . Unilateral complete BKA, left, initial encounter [facility])    Past Surgical History:  Procedure Laterality Date  . AMPUTATION Left 08/02/2018   Procedure: LEFT AMPUTATION BELOW KNEE;  Surgeon: Marty Heck, MD;  Location: [place];  Service: Vascular;  Laterality: Left;  . BILIARY STENT PLACEMENT N/A 06/11/2020   Procedure: BILIARY STENT PLACEMENT;  Surgeon: Rogene Houston, MD;  [facility];  Service: Endoscopy;  Laterality: N/A;  . ELBOW SURGERY     Bone graft to repair elbow  . ERCP N/A 06/11/2020   Procedure: ENDOSCOPIC RETROGRADE CHOLANGIOPANCREATOGRAPHY (ERCP);  Surgeon: Rogene Houston, MD;  [facility];  Service: Endoscopy;  Laterality: N/A;  . LOWER EXTREMITY ANGIOGRAPHY N/A 07/24/2018   Procedure: LOWER EXTREMITY ANGIOGRAPHY;  Surgeon: Marty Heck, MD;  Location: [place] [facility];  Service: Cardiovascular;  Laterality: N/A;  . PERIPHERAL VASCULAR INTERVENTION  07/24/2018   Procedure: PERIPHERAL VASCULAR INTERVENTION;  Surgeon: Marty Heck,  MD;  Location: [place] [facility];  Service: Cardiovascular;;  . PORTACATH PLACEMENT Left 05/21/2020   Procedure: INSERTION PORT-A-CATH;  Surgeon: Aviva Signs, MD;  [facility];  Service: General;  Laterality: Left;  . SPHINCTEROTOMY  06/11/2020   Procedure: SHORT SPHINCTEROTOMY;  Surgeon: Rogene Houston, MD;  [facility];  Service: Endoscopy;;  . TONSILLECTOMY    . TRANSMETATARSAL AMPUTATION Left 07/25/2018   Procedure: LEFT GREAT TOE AMPUTATION;  Surgeon: Marty Heck, MD;  Location: [place];  Service: Vascular;  Laterality: Left;  . TRANSMETATARSAL AMPUTATION Left 07/30/2018   Procedure: TRANSMETATARSAL AMPUTATION;  Surgeon: Waynetta Sandy, MD;  Location: [place];  Service: Vascular;  Laterality: Left;    SOCIAL HISTORY:  Social History   Socioeconomic History  . Marital status: Married    Spouse name: [name]  . Number of children: 4  . Years of education: Not on file  . Highest education level: Not on file  Occupational History  . Not on file  Tobacco Use  . Smoking status: Current Every Day Smoker    Packs/day: 0.50    Types: Cigarettes  . Smokeless tobacco: Former Systems developer    Types: Snuff    Quit date: 04/21/1989  Vaping Use  . Vaping Use: Never used  Substance and Sexual Activity  . Alcohol use: Not Currently    Alcohol/week: 0.0 standard drinks    Comment: stopped 2 years ago  . Drug use: No  . Sexual activity: Not Currently  Other Topics Concern  . Not on file  Social History Narrative   Lives with wife [name]- married 78 years    4 children with wife. All grown- live close by       15 Cats and 5 Dogs      Enjoys: walking when hip lets him, camping, likes being outside       Diet: eats all food groups    Caffeine: diet sodas 3    Water: 6-8 cups daily -gallon       Wears seat belt    Does not drive- daughter drove him 21 years ago lost license   No smoke detectors due to wood burning stove    Weapons- Sales executive          Social Determinants of Health   Financial Resource Strain: Medium Risk  . Difficulty of Paying Living Expenses: Somewhat hard  Food Insecurity: No Food Insecurity  . Worried About Charity fundraiser in the Last Year: Never true  . Ran Out of Food in the Last Year: Never true  Transportation Needs: No Transportation Needs  . Lack of Transportation (Medical): No  . Lack of Transportation (Non-Medical): No  Physical Activity: Inactive  . Days of Exercise per Week: 0 days  . Minutes of  Exercise per Session: 0 min  Stress: No Stress Concern Present  . Feeling of Stress : Not at all  Social Connections: Moderately Integrated  . Frequency of Communication with Friends and Family: Twice a week  . Frequency of Social Gatherings with Friends and Family: Twice a week  . Attends Religious Services: 1 to 4 times per year  . Active Member of Clubs or Organizations: No  . Attends Archivist Meetings: Never  . Marital Status: Married  Human resources officer Violence: Not At Risk  . Fear of Current or Ex-Partner: No  . Emotionally Abused: No  . Physically Abused: No  . Sexually Abused: No    FAMILY HISTORY:  Family History  Problem Relation Age of Onset  . Alcoholism Mother   . Alcoholism Father   . Cervical cancer Sister        dx. late 70s  . Cervical cancer Sister        dx. late 25s    CURRENT MEDICATIONS:  Current Outpatient Medications  Medication Sig Dispense Refill  . acetaminophen (TYLENOL) 500 MG tablet Take 500-1,000 mg by mouth every 6 (six) hours as needed (for pain).  (Patient not taking: Reported on 07/13/2020)    . aspirin EC 81 MG tablet Take 81 mg by mouth daily. Swallow whole.    . bisacodyl (DULCOLAX) 5 MG EC tablet Take 5 mg by mouth daily as needed for moderate constipation.     . blood glucose meter kit and supplies KIT Dispense based on patient and insurance preference. Use up to four times daily as directed. (FOR ICD-9 250.00, 250.01). 1 each 0  . dexamethasone (DECADRON) 10 MG/ML injection Inject 10 mg into the vein every 14 (fourteen) days. Dexamethasone 10mg  in NS 50 ml IVPB prior to chemotherapy administration     . fluorouracil CALGB 94765 in sodium chloride 0.9 % 150 mL Inject 2,400 mg/m2 into the vein over 48 hr.     . fosaprepitant (EMEND) 150 MG SOLR injection Inject 150 mg into the vein every 14 (fourteen) days. Prior to chemotherapy administration     . glipiZIDE (GLUCOTROL) 5 MG tablet Take 1 tablet (5 mg total) by mouth daily  before breakfast. 60 tablet 1  . IRINOTECAN HCL IV Inject 280 mg/m2 into the vein every 14 (fourteen) days.     Marland Kitchen LEUCOVORIN CALCIUM IV Inject 400 mg/m2 into the vein every 14 (fourteen) days.     Marland Kitchen lidocaine-prilocaine (EMLA) cream Apply a small amount to port a cath site and cover with plastic wrap 1 hour prior to chemotherapy appointments 30 g 3  . metFORMIN (GLUCOPHAGE) 500 MG tablet Take 2 tablets (1,000 mg total) by mouth 2 (two) times daily with a meal. 60 tablet 3  . OXALIPLATIN IV Inject 85 mg/m2 into the vein every 14 (fourteen) days.     Marland Kitchen oxyCODONE (ROXICODONE) 5 MG immediate release tablet Take 2 tablets (10 mg total) by mouth every 6 (six) hours as needed for moderate pain or severe pain. 120 tablet 0  . palonosetron (ALOXI) 0.25 MG/5ML injection Inject 0.25 mg into the vein every 14 (fourteen) days. Prior to chemotherapy administration     . potassium chloride SA (KLOR-CON) 20 MEQ tablet Take 2 tablets (40 mEq total) by mouth daily. 60 tablet 2  . potassium chloride SA (KLOR-CON) 20 MEQ tablet Take 1 tablet (20 mEq total) by mouth 2 (two) times daily. 8 tablet 0  . prochlorperazine (COMPAZINE) 10 MG tablet Take 1 tablet (10 mg total) by mouth every 6 (six) hours as needed for nausea or vomiting. 30 tablet 2  . tamsulosin (FLOMAX) 0.4 MG CAPS capsule Take 1 capsule (0.4 mg total) by mouth at bedtime. 30 capsule 2   No current facility-administered medications for this visit.    ALLERGIES:  Allergies  Allergen Reactions  . Tea Itching and Rash    PHYSICAL EXAM:  Performance status (ECOG): 1 - Symptomatic but completely ambulatory  Vitals:   07/13/20 0848  BP: 116/83  Pulse: 85  Resp: 18  Temp: (!) 96.9 F (36.1 C)  SpO2: 100%   Wt Readings from Last 3 Encounters:  07/13/20 133 lb (60.3 kg)  07/07/20 140  lb 3.2 oz (63.6 kg)  07/01/20 131 lb 9.8 oz (59.7 kg)   Physical Exam Vitals reviewed.  Constitutional:      Appearance: Normal appearance.  Chest:      Comments: Port-a-Cath in L chest Neurological:     General: No focal deficit present.     Mental Status: He is alert and oriented to person, place, and time.  Psychiatric:        Mood and Affect: Mood normal.        Behavior: Behavior normal.     LABORATORY DATA:  I have reviewed the labs as listed.  CBC Latest Ref Rng & Units 07/13/2020 07/07/2020 07/01/2020  WBC 4.0 - 10.5 K/uL 14.1(H) 15.6(H) 17.7(H)  Hemoglobin 13.0 - 17.0 g/dL 11.0(L) 10.5(L) 12.1(L)  Hematocrit 39 - 52 % 33.3(L) 31.7(L) 37.5(L)  Platelets 150 - 400 K/uL 319 311 377   CMP Latest Ref Rng & Units 07/13/2020 07/08/2020 07/07/2020  Glucose 70 - 99 mg/dL 293(H) 549(HH) 307(H)  BUN 8 - 23 mg/dL 7(L) - -  Creatinine 0.61 - 1.24 mg/dL 0.51(L) - -  Sodium 135 - 145 mmol/L 138 - -  Potassium 3.5 - 5.1 mmol/L 3.3(L) - -  Chloride 98 - 111 mmol/L 101 - -  CO2 22 - 32 mmol/L 26 - -  Calcium 8.9 - 10.3 mg/dL 8.5(L) - -  Total Protein 6.5 - 8.1 g/dL 6.7 - -  Total Bilirubin 0.3 - 1.2 mg/dL 2.1(H) - -  Alkaline Phos 38 - 126 U/L 412(H) - -  AST 15 - 41 U/L 36 - -  ALT 0 - 44 U/L 35 - -    DIAGNOSTIC IMAGING:  I have independently reviewed the scans and discussed with the patient. No results found.   ASSESSMENT:  1. 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 -Current active smoker, 1 pack/day for 47 years. No history of alcohol use. No family history of malignancy. -CT AP with contrast on 04/24/2020 shows 2.2 cm pancreatic head mass, small hypodense lesions throughout the bilateral lower lobes, predominantly in the right lower lobe, largest measuring 1.1 cm. 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 Mildly prominent lymph nodes within the upper abdomen above the pancreas head suspicious for metastatic lymphadenopathy. 5 mm pleural-based right lower lobe nodule. -CA 19-9  is 12. -Liver biopsy on 05/03/2020 consistent with metastatic adenocarcinoma, positive for CK7, CK20, CDX2. Differential diagnosis includes primary upper GI versus pancreaticobiliary. -PET scan on 05/19/2020 shows FDG avid had a pancreatic mass, multifocal low-attenuation lesions scattered throughout both lobes of the liver compatible with biopsy-proven liver metastasis. No FDG avid lymph nodes in the abdomen. No evidence of metastasis in the chest or bones. 2 small lung nodules within the right lower lobe measuring less than 5 mm. -ERCP and Wallstent placement on 06/11/2020.  2. Poorly controlled diabetes: -Hemoglobin A1c was 10.8 during hospitalization.  3. Abdominal pain: -Highly likely malignancy related. -He is currently on hydrocodone 5/325 1 to 2 tablets every 4 hours as needed. Patient reports that he still has pain but manageable.  4. Right kidney mass: -No further work-up needed if biopsy of liver lesion confirms pancreatic adenocarcinoma.   PLAN:  1.Metastatic pancreatic cancer to the liver: -He has tolerated 2 cycles of dose reduced FOLFIRINOX fairly well. -Bilirubin improved to 2.1.  AST has returned to normal.  Albumin is low but stable at  2.5.  CBC shows white count and platelet count is adequate. -He does not appear dehydrated.  Lab work is stable.  No IV fluids. -RTC next week for cycle 3, MD assessment and lab work.  2. Poorly controlled diabetes: -Continue Metformin 1000 mg twice daily.  We have started him on glipizide 5 mg in the mornings.  He has been very inconsistent with his medications.  States he misplaced all of his medications.  Blood sugar today is 293.  I have asked that he give [place] a call if he is unable to locate his medications.  He has required subcutaneous insulin while in our clinic to avoid being hospitalized secondary to noncompliance.  3. Abdominal pain: -Continue oxycodone 10 mg every 6 hours as needed.  4. Right kidney mass: -Right  kidney lesion is suspicious for RCC.  No work-up needed.  5. Genetic testing: -Invitae testing was negative.  6. Hypokalemia: -Potassium today is 3.3.  Restart potassium.  New prescription sent in case he is unable to find his medications.  I have asked to take 20 mEq twice daily x4 days.  7.  Urinary frequency: -Continue Flomax 0.4 mg at bedtime.  8.  Malnutrition/anorexia: -Multifactorial-secondary to treatment and malignancy. -He has met with our dietitian. -Patient in need of assistance to continue to buy supplements.  Will reach out to Jennet Maduro to see if she is able to provide him with some resources. -Albumin stable at 2.5. -No IV fluids today.   Orders placed this encounter:  No orders of the defined types were placed in this encounter.  Greater than 50% was spent in counseling and coordination of care with this patient including but not limited to discussion of the relevant topics above (See A&P) including, but not limited to diagnosis and management of acute and chronic medical conditions.   Faythe Casa, NP 07/13/2020 12:12 PM

## 2020-07-13 ENCOUNTER — Encounter (HOSPITAL_COMMUNITY): Payer: Self-pay | Admitting: Oncology

## 2020-07-13 ENCOUNTER — Inpatient Hospital Stay (HOSPITAL_COMMUNITY): Payer: Medicare Other

## 2020-07-13 ENCOUNTER — Inpatient Hospital Stay (HOSPITAL_BASED_OUTPATIENT_CLINIC_OR_DEPARTMENT_OTHER): Payer: Medicare Other | Admitting: Oncology

## 2020-07-13 ENCOUNTER — Other Ambulatory Visit: Payer: Self-pay

## 2020-07-13 VITALS — BP 116/83 | HR 85 | Temp 96.9°F | Resp 18 | Wt 133.0 lb

## 2020-07-13 DIAGNOSIS — C259 Malignant neoplasm of pancreas, unspecified: Secondary | ICD-10-CM | POA: Diagnosis not present

## 2020-07-13 DIAGNOSIS — Z5111 Encounter for antineoplastic chemotherapy: Secondary | ICD-10-CM | POA: Diagnosis not present

## 2020-07-13 DIAGNOSIS — E876 Hypokalemia: Secondary | ICD-10-CM

## 2020-07-13 DIAGNOSIS — C787 Secondary malignant neoplasm of liver and intrahepatic bile duct: Secondary | ICD-10-CM | POA: Diagnosis not present

## 2020-07-13 LAB — CBC WITH DIFFERENTIAL/PLATELET
Abs Immature Granulocytes: 0.07 10*3/uL (ref 0.00–0.07)
Basophils Absolute: 0.1 10*3/uL (ref 0.0–0.1)
Basophils Relative: 1 %
Eosinophils Absolute: 0.6 10*3/uL — ABNORMAL HIGH (ref 0.0–0.5)
Eosinophils Relative: 4 %
HCT: 33.3 % — ABNORMAL LOW (ref 39.0–52.0)
Hemoglobin: 11 g/dL — ABNORMAL LOW (ref 13.0–17.0)
Immature Granulocytes: 1 %
Lymphocytes Relative: 15 %
Lymphs Abs: 2.1 10*3/uL (ref 0.7–4.0)
MCH: 29.1 pg (ref 26.0–34.0)
MCHC: 33 g/dL (ref 30.0–36.0)
MCV: 88.1 fL (ref 80.0–100.0)
Monocytes Absolute: 0.8 10*3/uL (ref 0.1–1.0)
Monocytes Relative: 5 %
Neutro Abs: 10.5 10*3/uL — ABNORMAL HIGH (ref 1.7–7.7)
Neutrophils Relative %: 74 %
Platelets: 319 10*3/uL (ref 150–400)
RBC: 3.78 MIL/uL — ABNORMAL LOW (ref 4.22–5.81)
RDW: 16.1 % — ABNORMAL HIGH (ref 11.5–15.5)
WBC: 14.1 10*3/uL — ABNORMAL HIGH (ref 4.0–10.5)
nRBC: 0 % (ref 0.0–0.2)

## 2020-07-13 LAB — COMPREHENSIVE METABOLIC PANEL
ALT: 35 U/L (ref 0–44)
AST: 36 U/L (ref 15–41)
Albumin: 2.5 g/dL — ABNORMAL LOW (ref 3.5–5.0)
Alkaline Phosphatase: 412 U/L — ABNORMAL HIGH (ref 38–126)
Anion gap: 11 (ref 5–15)
BUN: 7 mg/dL — ABNORMAL LOW (ref 8–23)
CO2: 26 mmol/L (ref 22–32)
Calcium: 8.5 mg/dL — ABNORMAL LOW (ref 8.9–10.3)
Chloride: 101 mmol/L (ref 98–111)
Creatinine, Ser: 0.51 mg/dL — ABNORMAL LOW (ref 0.61–1.24)
GFR, Estimated: >60 mL/min (ref >60)
Glucose, Bld: 293 mg/dL — ABNORMAL HIGH (ref 70–99)
Potassium: 3.3 mmol/L — ABNORMAL LOW (ref 3.5–5.1)
Sodium: 138 mmol/L (ref 135–145)
Total Bilirubin: 2.1 mg/dL — ABNORMAL HIGH (ref 0.3–1.2)
Total Protein: 6.7 g/dL (ref 6.5–8.1)

## 2020-07-13 LAB — MAGNESIUM: Magnesium: 1.9 mg/dL (ref 1.7–2.4)

## 2020-07-13 MED ORDER — POTASSIUM CHLORIDE CRYS ER 20 MEQ PO TBCR: 20.0000 meq | EXTENDED_RELEASE_TABLET | Freq: Two times a day (BID) | ORAL | 0 refills | Status: DC

## 2020-07-13 NOTE — Progress Notes (Signed)
John Islam Sr. presents today for follow up visit with Faythe Casa, NP. Labs reviewed and per NP, no fluids or further interventions required today. Port flushed and deaccessed per protocol. Pt discharged via wheelchair in satisfactory condition.

## 2020-07-13 NOTE — Patient Instructions (Signed)
[facility] at [facility] Discharge Instructions  Labs drawn from portacath today   Thank you for choosing [facility] at [facility] to provide your oncology and hematology care.  To afford each patient quality time with our provider, please arrive at least 15 minutes before your scheduled appointment time.   If you have a lab appointment with the Cancer Center please come in thru the Main Entrance and check in at the main information desk.  You need to re-schedule your appointment should you arrive 10 or more minutes late.  We strive to give you quality time with our providers, and arriving late affects you and other patients whose appointments are after yours.  Also, if you no show three or more times for appointments you may be dismissed from the clinic at the providers discretion.     Again, thank you for choosing [facility].  Our hope is that these requests will decrease the amount of time that you wait before being seen by our physicians.       _____________________________________________________________  Should you have questions after your visit to [facility], please contact our office at [phone] and follow the prompts.  Our office hours are 8:00 a.m. and 4:30 p.m. Monday - Friday.  Please note that voicemails left after 4:00 p.m. may not be returned until the following business day.  We are closed weekends and major holidays.  You do have access to a nurse 24-7, just call the main number to the clinic [phone] and do not press any options, hold on the line and a nurse will answer the phone.    For prescription refill requests, have your pharmacy contact our office and allow 72 hours.    Due to Covid, you will need to wear a mask upon entering the hospital. If you do not have a mask, a mask will be given to you at the Main Entrance upon arrival. For doctor visits, patients may have 1 support person age 18  or older with them. For treatment visits, patients can not have anyone with them due to social distancing guidelines and our immunocompromised population.     

## 2020-07-15 ENCOUNTER — Other Ambulatory Visit (HOSPITAL_COMMUNITY): Payer: Medicare Other

## 2020-07-15 ENCOUNTER — Ambulatory Visit (HOSPITAL_COMMUNITY): Payer: Medicare Other

## 2020-07-15 ENCOUNTER — Ambulatory Visit (HOSPITAL_COMMUNITY): Payer: Medicare Other | Admitting: Hematology

## 2020-07-20 NOTE — Progress Notes (Signed)
[name], [place] [address]   CLINIC:  Medical Oncology/Hematology  PCP:  Perlie Mayo, NP [address] 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 The report date is 06/18/2020.   The Common Hereditary Cancers Panel offered by [facility] includes sequencing and/or deletion duplication testing of the following 48 genes: APC, ATM, AXIN2, BARD1, BMPR1A, BRCA1, BRCA2, BRIP1, CDH1, CDK4, CDKN2A (p14ARF), CDKN2A (p16INK4a), CHEK2, CTNNA1, DICER1, EPCAM (Deletion/duplication testing only), GREM1 (promoter region deletion/duplication testing only), KIT, MEN1, MLH1, MSH2, MSH3, MSH6, MUTYH, NBN, NF1, NTHL1, PALB2, PDGFRA, PMS2, POLD1, POLE, PTEN, RAD50, RAD51C, RAD51D, RNF43, SDHB, SDHC, SDHD, SMAD4, SMARCA4. STK11, TP53, TSC1, TSC2, and VHL.  The following genes were evaluated for sequence changes only: SDHA and HOXB13 c.251G>A variant only. The Pancreatic Cancer Panel offered by [facility] includes sequencing and deletion/duplication analysis of the following 29 genes: APC, ATM, BMPR1A, BRCA1, BRCA2, CASR, CDK4, CDKN2A, CFTR, CPA1, CTRC, EPCAM, FANCC, MEN1, MLH1, MSH2, MSH6, NF1, PALB2, PALLD, PMS2, PRSS1, SMAD4, SPINK1, STK11, TP53, TSC1, TSC2, and VHL.   06/23/2020 -  Chemotherapy   The patient had palonosetron (ALOXI) injection 0.25 mg, 0.25 mg, Intravenous,  Once, 2 of 4  cycles Administration: 0.25 mg (06/23/2020), 0.25 mg (07/07/2020) irinotecan (CAMPTOSAR) 160 mg in sodium chloride 0.9 % 500 mL chemo infusion, 90 mg/m2 = 160 mg (60 % of original dose 150 mg/m2), Intravenous,  Once, 1 of 3 cycles Dose modification: 90 mg/m2 (60 % of original dose 150 mg/m2, Cycle 2, Reason: Change in LFTs) Administration: 160 mg (07/07/2020) oxaliplatin (ELOXATIN) 115 mg in dextrose 5 % 500 mL chemo infusion, 63.75 mg/m2 = 115 mg (75 % of original dose 85 mg/m2), Intravenous,  Once, 2 of 4 cycles Dose modification: 63.75 mg/m2 (75 % of original dose 85 mg/m2, Cycle 1, Reason: Provider Judgment), 68 mg/m2 (80 % of original dose 85 mg/m2, Cycle 2, Reason: Provider Judgment) Administration: 115 mg (06/23/2020), 125 mg (07/07/2020) fosaprepitant (EMEND) 150 mg in sodium chloride 0.9 % 145 mL IVPB, 150 mg, Intravenous,  Once, 2 of 4 cycles Administration: 150 mg (06/23/2020), 150 mg (07/07/2020) fluorouracil (ADRUCIL) 2,500 mg in sodium chloride 0.9 % 100 mL chemo infusion, 1,370 mg/m2 = 2,650 mg (60 % of original dose 2,400 mg/m2), Intravenous, 1 Day/Dose, 2 of 4 cycles Dose modification: 1,800 mg/m2 (75 % of original dose 2,400 mg/m2, Cycle 1, Reason: Provider Judgment), 1,440 mg/m2 (60 % of original dose 2,400 mg/m2, Cycle 1, Reason: Change in LFTs) Administration: 2,500 mg (06/23/2020), 4,400 mg (07/07/2020) leucovorin 440 mg in dextrose 5 % 250 mL infusion, 240 mg/m2 = 440 mg (60 % of original dose 400 mg/m2), Intravenous,  Once, 2 of 4 cycles Dose modification: 300 mg/m2 (75 % of original dose 400 mg/m2, Cycle 1, Reason: Provider Judgment), 240 mg/m2 (60 % of original dose 400 mg/m2, Cycle 1, Reason: Change in LFTs) Administration: 440 mg (06/23/2020), 732 mg (07/07/2020)  for chemotherapy treatment.  CANCER STAGING: Cancer Staging No matching staging information was found for the patient.  INTERVAL HISTORY:  Mr. John Montour Sr., a 69 y.o. male, returns for routine  follow-up and consideration for next cycle of chemotherapy. [name] was last seen on 07/07/2020.  Due for cycle #3 of FOLFIRINOX today.   Overall, he tells me he has been feeling pretty well.  He continues to have abdominal pain.  He reports noncompliance with his diabetes medication.  Reports appetite 50% and energy level 25%.  Denies any GI side effects including nausea, vomiting or diarrhea.  Overall, he feels ready for next cycle of chemo today.    REVIEW OF SYSTEMS:  Review of Systems  Gastrointestinal: Positive for abdominal pain.  Psychiatric/Behavioral: Positive for sleep disturbance.  All other systems reviewed and are negative.   PAST MEDICAL/SURGICAL HISTORY:  Past Medical History:  Diagnosis Date  . Acute blood loss anemia   . Arthritis   . Benign essential HTN   . Cellulitis 07/20/2018  . Cellulitis in diabetic foot ([place]) 07/20/2018  . Diabetes mellitus without complication ([place])   . Diabetes mellitus, type II ([place]) 07/20/2018  . Dry gangrene ([place]) 07/20/2018  . Emesis   . Family history of cervical cancer   . Heart murmur   . Hyperglycemia   . Hypertension   . Joint pain   . Port-A-Cath in place 05/25/2020  . Unilateral complete BKA, left, initial encounter [facility])    Past Surgical History:  Procedure Laterality Date  . AMPUTATION Left 08/02/2018   Procedure: LEFT AMPUTATION BELOW KNEE;  Surgeon: Marty Heck, MD;  Location: [facility];  Service: Vascular;  Laterality: Left;  . BILIARY STENT PLACEMENT N/A 06/11/2020   Procedure: BILIARY STENT PLACEMENT;  Surgeon: Rogene Houston, MD;  [facility];  Service: Endoscopy;  Laterality: N/A;  . ELBOW SURGERY     Bone graft to repair elbow  . ERCP N/A 06/11/2020   Procedure: ENDOSCOPIC RETROGRADE CHOLANGIOPANCREATOGRAPHY (ERCP);  Surgeon: Rogene Houston, MD;  [facility];  Service: Endoscopy;  Laterality: N/A;  . LOWER EXTREMITY ANGIOGRAPHY N/A 07/24/2018   Procedure: LOWER EXTREMITY  ANGIOGRAPHY;  Surgeon: Marty Heck, MD;  Location: [facility];  Service: Cardiovascular;  Laterality: N/A;  . PERIPHERAL VASCULAR INTERVENTION  07/24/2018   Procedure: PERIPHERAL VASCULAR INTERVENTION;  Surgeon: Marty Heck, MD;  Location: [facility];  Service: Cardiovascular;;  . PORTACATH PLACEMENT Left 05/21/2020   Procedure: INSERTION PORT-A-CATH;  Surgeon: Aviva Signs, MD;  Location[facility];  Service: General;  Laterality: Left;  . SPHINCTEROTOMY  06/11/2020   Procedure: SHORT SPHINCTEROTOMY;  Surgeon: Rogene Houston, MD;  [facility];  Service: Endoscopy;;  . TONSILLECTOMY    . TRANSMETATARSAL AMPUTATION Left 07/25/2018   Procedure: LEFT GREAT TOE AMPUTATION;  Surgeon: Marty Heck, MD;  Location: [facility];  Service: Vascular;  Laterality: Left;  . TRANSMETATARSAL AMPUTATION Left 07/30/2018   Procedure: TRANSMETATARSAL AMPUTATION;  Surgeon: Waynetta Sandy, MD;  Location: [facility];  Service: Vascular;  Laterality: Left;    SOCIAL HISTORY:  Social History   Socioeconomic History  . Marital status: Married    Spouse name: [name]  . Number of children: 4  . Years of education: Not on file  . Highest education level: Not on file  Occupational History  . Not on file  Tobacco Use  . Smoking status: Current Every Day Smoker    Packs/day: 0.50    Types:  Cigarettes  . Smokeless tobacco: Former Systems developer    Types: Snuff    Quit date: 04/21/1989  Vaping Use  . Vaping Use: Never used  Substance and Sexual Activity  . Alcohol use: Not Currently    Alcohol/week: 0.0 standard drinks    Comment: stopped 2 years ago  . Drug use: No  . Sexual activity: Not Currently  Other Topics Concern  . Not on file  Social History Narrative   Lives with wife [name]- married 32 years    4 children with wife. All grown- live close by       15 Cats and 5 Dogs      Enjoys: walking when hip lets him, camping, likes being outside       Diet: eats  all food groups    Caffeine: diet sodas 3    Water: 6-8 cups daily -gallon       Wears seat belt    Does not drive- daughter drove him 21 years ago lost license   No smoke detectors due to wood burning stove    Weapons- Sales executive          Social Determinants of Health   Financial Resource Strain: Medium Risk  . Difficulty of Paying Living Expenses: Somewhat hard  Food Insecurity: No Food Insecurity  . Worried About Charity fundraiser in the Last Year: Never true  . Ran Out of Food in the Last Year: Never true  Transportation Needs: No Transportation Needs  . Lack of Transportation (Medical): No  . Lack of Transportation (Non-Medical): No  Physical Activity: Inactive  . Days of Exercise per Week: 0 days  . Minutes of Exercise per Session: 0 min  Stress: No Stress Concern Present  . Feeling of Stress : Not at all  Social Connections: Moderately Integrated  . Frequency of Communication with Friends and Family: Twice a week  . Frequency of Social Gatherings with Friends and Family: Twice a week  . Attends Religious Services: 1 to 4 times per year  . Active Member of Clubs or Organizations: No  . Attends Archivist Meetings: Never  . Marital Status: Married  Human resources officer Violence: Not At Risk  . Fear of Current or Ex-Partner: No  . Emotionally Abused: No  . Physically Abused: No  . Sexually Abused: No    FAMILY HISTORY:  Family History  Problem Relation Age of Onset  . Alcoholism Mother   . Alcoholism Father   . Cervical cancer Sister        dx. late 47s  . Cervical cancer Sister        dx. late 4s    CURRENT MEDICATIONS:  Current Outpatient Medications  Medication Sig Dispense Refill  . acetaminophen (TYLENOL) 500 MG tablet Take 500-1,000 mg by mouth every 6 (six) hours as needed (for pain).  (Patient not taking: Reported on 07/13/2020)    . aspirin EC 81 MG tablet Take 81 mg by mouth daily. Swallow whole.    . bisacodyl (DULCOLAX) 5 MG EC tablet Take  5 mg by mouth daily as needed for moderate constipation.     . blood glucose meter kit and supplies KIT Dispense based on patient and insurance preference. Use up to four times daily as directed. (FOR ICD-9 250.00, 250.01). 1 each 0  . dexamethasone (DECADRON) 10 MG/ML injection Inject 10 mg into the vein every 14 (fourteen) days. Dexamethasone 43m in NS 50 ml IVPB prior to chemotherapy administration     .  fluorouracil CALGB 37290 in sodium chloride 0.9 % 150 mL Inject 2,400 mg/m2 into the vein over 48 hr.     . fosaprepitant (EMEND) 150 MG SOLR injection Inject 150 mg into the vein every 14 (fourteen) days. Prior to chemotherapy administration     . glipiZIDE (GLUCOTROL) 5 MG tablet Take 1 tablet (5 mg total) by mouth daily before breakfast. 60 tablet 1  . IRINOTECAN HCL IV Inject 280 mg/m2 into the vein every 14 (fourteen) days.     Marland Kitchen LEUCOVORIN CALCIUM IV Inject 400 mg/m2 into the vein every 14 (fourteen) days.     Marland Kitchen lidocaine-prilocaine (EMLA) cream Apply a small amount to port a cath site and cover with plastic wrap 1 hour prior to chemotherapy appointments 30 g 3  . metFORMIN (GLUCOPHAGE) 500 MG tablet Take 2 tablets (1,000 mg total) by mouth 2 (two) times daily with a meal. 60 tablet 3  . OXALIPLATIN IV Inject 85 mg/m2 into the vein every 14 (fourteen) days.     Marland Kitchen oxyCODONE (ROXICODONE) 5 MG immediate release tablet Take 2 tablets (10 mg total) by mouth every 6 (six) hours as needed for moderate pain or severe pain. 120 tablet 0  . palonosetron (ALOXI) 0.25 MG/5ML injection Inject 0.25 mg into the vein every 14 (fourteen) days. Prior to chemotherapy administration     . potassium chloride SA (KLOR-CON) 20 MEQ tablet Take 2 tablets (40 mEq total) by mouth daily. 60 tablet 2  . potassium chloride SA (KLOR-CON) 20 MEQ tablet Take 1 tablet (20 mEq total) by mouth 2 (two) times daily. 8 tablet 0  . prochlorperazine (COMPAZINE) 10 MG tablet Take 1 tablet (10 mg total) by mouth every 6 (six) hours  as needed for nausea or vomiting. 30 tablet 2  . tamsulosin (FLOMAX) 0.4 MG CAPS capsule Take 1 capsule (0.4 mg total) by mouth at bedtime. 30 capsule 2   No current facility-administered medications for this visit.    ALLERGIES:  Allergies  Allergen Reactions  . Tea Itching and Rash    PHYSICAL EXAM:  Performance status (ECOG): 1 - Symptomatic but completely ambulatory  There were no vitals filed for this visit. Wt Readings from Last 3 Encounters:  07/13/20 133 lb (60.3 kg)  07/07/20 140 lb 3.2 oz (63.6 kg)  07/01/20 131 lb 9.8 oz (59.7 kg)   Physical Exam Vitals reviewed.  Constitutional:      Appearance: Normal appearance.  Cardiovascular:     Rate and Rhythm: Normal rate and regular rhythm.     Heart sounds: Normal heart sounds.  Pulmonary:     Effort: Pulmonary effort is normal.     Breath sounds: Normal breath sounds.  Chest:     Comments: Port-a-Cath in L chest Neurological:     General: No focal deficit present.     Mental Status: He is alert and oriented to person, place, and time.  Psychiatric:        Mood and Affect: Mood normal.        Behavior: Behavior normal.     LABORATORY DATA:  I have reviewed the labs as listed.  CBC Latest Ref Rng & Units 07/13/2020 07/07/2020 07/01/2020  WBC 4.0 - 10.5 K/uL 14.1(H) 15.6(H) 17.7(H)  Hemoglobin 13.0 - 17.0 g/dL 11.0(L) 10.5(L) 12.1(L)  Hematocrit 39 - 52 % 33.3(L) 31.7(L) 37.5(L)  Platelets 150 - 400 K/uL 319 311 377   CMP Latest Ref Rng & Units 07/13/2020 07/08/2020 07/07/2020  Glucose 70 - 99 mg/dL 293(H) 549(HH)  307(H)  BUN 8 - 23 mg/dL 7(L) - -  Creatinine 0.61 - 1.24 mg/dL 0.51(L) - -  Sodium 135 - 145 mmol/L 138 - -  Potassium 3.5 - 5.1 mmol/L 3.3(L) - -  Chloride 98 - 111 mmol/L 101 - -  CO2 22 - 32 mmol/L 26 - -  Calcium 8.9 - 10.3 mg/dL 8.5(L) - -  Total Protein 6.5 - 8.1 g/dL 6.7 - -  Total Bilirubin 0.3 - 1.2 mg/dL 2.1(H) - -  Alkaline Phos 38 - 126 U/L 412(H) - -  AST 15 - 41 U/L 36 - -  ALT  0 - 44 U/L 35 - -    DIAGNOSTIC IMAGING:  I have independently reviewed the scans and discussed with the patient. No results found.   ASSESSMENT:  1. 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 -Current active smoker, 1 pack/day for 47 years. No history of alcohol use. No family history of malignancy. -CT AP with contrast on 04/24/2020 shows 2.2 cm pancreatic head mass, small hypodense lesions throughout the bilateral lower lobes, predominantly in the right lower lobe, largest measuring 1.1 cm. 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 Mildly prominent lymph nodes within the upper abdomen above the pancreas head suspicious for metastatic lymphadenopathy. 5 mm pleural-based right lower lobe nodule. -CA 19-9 is 12. -Liver biopsy on 05/03/2020 consistent with metastatic adenocarcinoma, positive for CK7, CK20, CDX2. Differential diagnosis includes primary upper GI versus pancreaticobiliary. -PET scan on 05/19/2020 shows FDG avid had a pancreatic mass, multifocal low-attenuation lesions scattered throughout both lobes of the liver compatible with biopsy-proven liver metastasis. No FDG avid lymph nodes in the abdomen. No evidence of metastasis in the chest or bones. 2 small lung nodules within the right lower lobe measuring less than 5 mm. -ERCP and Wallstent placement on 06/11/2020.  2. Poorly controlled diabetes: -Hemoglobin A1c was 10.8 during hospitalization.  3. Abdominal pain: -Highly likely malignancy related. -He is currently on hydrocodone 5/325 1 to 2 tablets every 4 hours as needed. Patient reports that he still has pain but manageable.  4. Right kidney mass: -No further work-up needed if biopsy of liver lesion confirms pancreatic adenocarcinoma.   PLAN:  1.Metastatic pancreatic cancer to the liver: -He has tolerated cycle 3  reasonably well.  No major GI side effects. -Reviewed labs.  AST is elevated at 48.  Total bilirubin is improved to 1.4. -We will proceed with next cycle today.  We will increase oxaliplatin to full dose.  We will also increase his Irinotecan to 100 mg per metered squared. -RTC 2 weeks for follow-up.  2. Poorly controlled diabetes: -He is on Metformin 1000 mg twice daily and glipizide 5 mg in the mornings with breakfast. -However he is very noncompliant.  He reportedly forgot where he put the medicines at home. -Recommended compliance as it could be fatal.  Will make a referral to endocrinology.  Today's blood sugar is 375.  He will receive hydration and insulin.  3. Abdominal pain: -Continue oxycodone 10 mg every 6 hours as needed.  4. Right kidney mass: -Right kidney lesion suspicious for RCC.  No work-up needed.  5. Genetic testing: -[facility] testing is negative.  6. Hypokalemia: -Potassium 3.7.  Continue 40 mEq potassium daily.  7. Urinary frequency: -Continue Flomax 0.4 mg at bedtime.   Orders placed this encounter:  No orders of the defined types were  placed in this encounter.    Derek Jack, MD [place] [phone]   I, Milinda Antis, am acting as a scribe for Dr. Sanda Linger.  I, Derek Jack MD, have reviewed the above documentation for accuracy and completeness, and I agree with the above.

## 2020-07-21 ENCOUNTER — Other Ambulatory Visit (HOSPITAL_COMMUNITY): Payer: Self-pay

## 2020-07-21 ENCOUNTER — Other Ambulatory Visit: Payer: Self-pay

## 2020-07-21 ENCOUNTER — Inpatient Hospital Stay (HOSPITAL_COMMUNITY): Payer: Medicare Other

## 2020-07-21 ENCOUNTER — Inpatient Hospital Stay (HOSPITAL_BASED_OUTPATIENT_CLINIC_OR_DEPARTMENT_OTHER): Payer: Medicare Other | Admitting: Hematology

## 2020-07-21 VITALS — BP 109/74 | HR 93 | Temp 96.7°F | Resp 18 | Wt 131.6 lb

## 2020-07-21 VITALS — BP 142/87 | HR 83 | Temp 96.9°F | Resp 16

## 2020-07-21 DIAGNOSIS — C787 Secondary malignant neoplasm of liver and intrahepatic bile duct: Secondary | ICD-10-CM

## 2020-07-21 DIAGNOSIS — E1142 Type 2 diabetes mellitus with diabetic polyneuropathy: Secondary | ICD-10-CM

## 2020-07-21 DIAGNOSIS — C259 Malignant neoplasm of pancreas, unspecified: Secondary | ICD-10-CM

## 2020-07-21 DIAGNOSIS — Z95828 Presence of other vascular implants and grafts: Secondary | ICD-10-CM

## 2020-07-21 DIAGNOSIS — Z5111 Encounter for antineoplastic chemotherapy: Secondary | ICD-10-CM | POA: Diagnosis not present

## 2020-07-21 DIAGNOSIS — R739 Hyperglycemia, unspecified: Secondary | ICD-10-CM

## 2020-07-21 LAB — COMPREHENSIVE METABOLIC PANEL
ALT: 37 U/L (ref 0–44)
AST: 48 U/L — ABNORMAL HIGH (ref 15–41)
Albumin: 2.5 g/dL — ABNORMAL LOW (ref 3.5–5.0)
Alkaline Phosphatase: 426 U/L — ABNORMAL HIGH (ref 38–126)
Anion gap: 8 (ref 5–15)
BUN: 8 mg/dL (ref 8–23)
CO2: 27 mmol/L (ref 22–32)
Calcium: 8.5 mg/dL — ABNORMAL LOW (ref 8.9–10.3)
Chloride: 99 mmol/L (ref 98–111)
Creatinine, Ser: 0.48 mg/dL — ABNORMAL LOW (ref 0.61–1.24)
GFR, Estimated: >60 mL/min (ref >60)
Glucose, Bld: 375 mg/dL — ABNORMAL HIGH (ref 70–99)
Potassium: 3.7 mmol/L (ref 3.5–5.1)
Sodium: 134 mmol/L — ABNORMAL LOW (ref 135–145)
Total Bilirubin: 1.4 mg/dL — ABNORMAL HIGH (ref 0.3–1.2)
Total Protein: 6.8 g/dL (ref 6.5–8.1)

## 2020-07-21 LAB — CBC WITH DIFFERENTIAL/PLATELET
Abs Immature Granulocytes: 0.05 10*3/uL (ref 0.00–0.07)
Basophils Absolute: 0.1 10*3/uL (ref 0.0–0.1)
Basophils Relative: 1 %
Eosinophils Absolute: 0.7 10*3/uL — ABNORMAL HIGH (ref 0.0–0.5)
Eosinophils Relative: 5 %
HCT: 33.7 % — ABNORMAL LOW (ref 39.0–52.0)
Hemoglobin: 10.8 g/dL — ABNORMAL LOW (ref 13.0–17.0)
Immature Granulocytes: 0 %
Lymphocytes Relative: 13 %
Lymphs Abs: 2 10*3/uL (ref 0.7–4.0)
MCH: 28.8 pg (ref 26.0–34.0)
MCHC: 32 g/dL (ref 30.0–36.0)
MCV: 89.9 fL (ref 80.0–100.0)
Monocytes Absolute: 1 10*3/uL (ref 0.1–1.0)
Monocytes Relative: 7 %
Neutro Abs: 11.2 10*3/uL — ABNORMAL HIGH (ref 1.7–7.7)
Neutrophils Relative %: 74 %
Platelets: 267 10*3/uL (ref 150–400)
RBC: 3.75 MIL/uL — ABNORMAL LOW (ref 4.22–5.81)
RDW: 16.6 % — ABNORMAL HIGH (ref 11.5–15.5)
WBC: 15.1 10*3/uL — ABNORMAL HIGH (ref 4.0–10.5)
nRBC: 0 % (ref 0.0–0.2)

## 2020-07-21 LAB — GLUCOSE, RANDOM: Glucose, Bld: 290 mg/dL — ABNORMAL HIGH (ref 70–99)

## 2020-07-21 LAB — MAGNESIUM: Magnesium: 2 mg/dL (ref 1.7–2.4)

## 2020-07-21 MED ORDER — SODIUM CHLORIDE 0.9 % IV SOLN
10.0000 mg | Freq: Once | INTRAVENOUS | Status: AC
  Administered 2020-07-21: 10 mg via INTRAVENOUS
  Filled 2020-07-21: qty 10

## 2020-07-21 MED ORDER — INSULIN ASPART 100 UNIT/ML ~~LOC~~ SOLN
10.0000 [IU] | Freq: Once | SUBCUTANEOUS | Status: AC
  Administered 2020-07-21: 10 [IU] via INTRAVENOUS
  Filled 2020-07-21: qty 0.1

## 2020-07-21 MED ORDER — METFORMIN HCL 500 MG PO TABS: 1000.0000 mg | ORAL_TABLET | Freq: Two times a day (BID) | ORAL | 3 refills | Status: DC

## 2020-07-21 MED ORDER — SODIUM CHLORIDE 0.9 % IV SOLN
400.0000 mg/m2 | Freq: Once | INTRAVENOUS | Status: AC
  Administered 2020-07-21: 732 mg via INTRAVENOUS
  Filled 2020-07-21: qty 36.6

## 2020-07-21 MED ORDER — GLIPIZIDE 5 MG PO TABS
5.0000 mg | ORAL_TABLET | Freq: Every day | ORAL | 1 refills | Status: DC
Start: 2020-07-21 — End: 2020-07-22

## 2020-07-21 MED ORDER — SODIUM CHLORIDE 0.9 % IV SOLN: Freq: Once | INTRAVENOUS | Status: AC

## 2020-07-21 MED ORDER — SODIUM CHLORIDE 0.9 % IV SOLN
150.0000 mg | Freq: Once | INTRAVENOUS | Status: AC
  Administered 2020-07-21: 150 mg via INTRAVENOUS
  Filled 2020-07-21: qty 150

## 2020-07-21 MED ORDER — HEPARIN SOD (PORK) LOCK FLUSH 100 UNIT/ML IV SOLN: 500.0000 [IU] | Freq: Once | INTRAVENOUS | Status: DC | PRN

## 2020-07-21 MED ORDER — DEXTROSE 5 % IV SOLN: Freq: Once | INTRAVENOUS | Status: DC

## 2020-07-21 MED ORDER — SODIUM CHLORIDE 0.9 % IV SOLN
100.0000 mg/m2 | Freq: Once | INTRAVENOUS | Status: AC
  Administered 2020-07-21: 180 mg via INTRAVENOUS
  Filled 2020-07-21: qty 5

## 2020-07-21 MED ORDER — SODIUM CHLORIDE 0.9 % IV SOLN
2400.0000 mg/m2 | INTRAVENOUS | Status: DC
  Administered 2020-07-21: 4400 mg via INTRAVENOUS
  Filled 2020-07-21: qty 88

## 2020-07-21 MED ORDER — SODIUM CHLORIDE 0.9 % IV SOLN: INTRAVENOUS | Status: AC

## 2020-07-21 MED ORDER — SODIUM CHLORIDE 0.9% FLUSH: 10.0000 mL | INTRAVENOUS | Status: DC | PRN

## 2020-07-21 MED ORDER — DEXTROSE 5 % IV SOLN: Freq: Once | INTRAVENOUS | Status: AC

## 2020-07-21 MED ORDER — PALONOSETRON HCL INJECTION 0.25 MG/5ML
0.2500 mg | Freq: Once | INTRAVENOUS | Status: AC
  Administered 2020-07-21: 0.25 mg via INTRAVENOUS
  Filled 2020-07-21: qty 5

## 2020-07-21 MED ORDER — ATROPINE SULFATE 1 MG/ML IJ SOLN
0.5000 mg | Freq: Once | INTRAMUSCULAR | Status: AC | PRN
  Administered 2020-07-21: 0.5 mg via INTRAVENOUS
  Filled 2020-07-21: qty 1

## 2020-07-21 MED ORDER — OXALIPLATIN CHEMO INJECTION 100 MG/20ML
83.0000 mg/m2 | Freq: Once | INTRAVENOUS | Status: AC
  Administered 2020-07-21: 150 mg via INTRAVENOUS
  Filled 2020-07-21: qty 10

## 2020-07-21 NOTE — Progress Notes (Signed)
Confirmed doses of oxaliplatin and irinotecan today.  oxaliplatin 85 mg/m2 Irinotecan 100 mg/m2  T.O. Dr Rhys Martini, PharmD

## 2020-07-21 NOTE — Progress Notes (Signed)
Pt here today for D1C3 of FOLFIRINOX.  Pt is having some pain his his chest and shoulder that has been constant but no worse since his last treatment. Pt appetite has decreased. Pt is meeting with nutrition already.  Blood glucose 375, alkaline phosphatase 426, bili 1.4. give 1 liter normal saline over 2 hours, 10 units IV Novolog recheck glucose 1 hour after. If glucose over 300 repeat 10 units IV novolog. No matter what the repeat glucose is, patient is okay to proceed with treatment today.  Tolerated treatment well today without incidence.  Discharged home in stable condition via wheelchair in stable condition with ambulatory pump.  Vital signs stable prior to discharge.

## 2020-07-21 NOTE — Patient Instructions (Signed)
[facility] Discharge Instructions for Patients Receiving Chemotherapy  Today you received the following chemotherapy agents   To help prevent nausea and vomiting after your treatment, we encourage you to take your nausea medication   If you develop nausea and vomiting that is not controlled by your nausea medication, call the clinic.   BELOW ARE SYMPTOMS THAT SHOULD BE REPORTED IMMEDIATELY:  *FEVER GREATER THAN 100.5 F  *CHILLS WITH OR WITHOUT FEVER  NAUSEA AND VOMITING THAT IS NOT CONTROLLED WITH YOUR NAUSEA MEDICATION  *UNUSUAL SHORTNESS OF BREATH  *UNUSUAL BRUISING OR BLEEDING  TENDERNESS IN MOUTH AND THROAT WITH OR WITHOUT PRESENCE OF ULCERS  *URINARY PROBLEMS  *BOWEL PROBLEMS  UNUSUAL RASH Items with * indicate a potential emergency and should be followed up as soon as possible.  Feel free to call the clinic should you have any questions or concerns. The clinic phone number is [phone].  Please show the CHEMO ALERT CARD at check-in to the Emergency Department and triage nurse.   

## 2020-07-21 NOTE — Progress Notes (Signed)
Dr. Delton Coombes has assessed patient and reviewed labs. Dr. Delton Coombes has prescribed 1L NS over 2 hours, 10 units IV Novolog. Check glucose 1 hour later, if glucose is >300, repeat 10 units IV Novolog. Patient can proceed with treatment following Glucose recheck.

## 2020-07-22 ENCOUNTER — Encounter: Payer: Self-pay | Admitting: "Endocrinology

## 2020-07-22 ENCOUNTER — Ambulatory Visit (INDEPENDENT_AMBULATORY_CARE_PROVIDER_SITE_OTHER): Payer: Medicare Other | Admitting: "Endocrinology

## 2020-07-22 VITALS — BP 135/77 | HR 87 | Ht 71.0 in | Wt 134.0 lb

## 2020-07-22 DIAGNOSIS — E0865 Diabetes mellitus due to underlying condition with hyperglycemia: Secondary | ICD-10-CM

## 2020-07-22 DIAGNOSIS — F17200 Nicotine dependence, unspecified, uncomplicated: Secondary | ICD-10-CM

## 2020-07-22 DIAGNOSIS — K8681 Exocrine pancreatic insufficiency: Secondary | ICD-10-CM | POA: Diagnosis not present

## 2020-07-22 DIAGNOSIS — K8689 Other specified diseases of pancreas: Secondary | ICD-10-CM | POA: Diagnosis not present

## 2020-07-22 DIAGNOSIS — IMO0002 Reserved for concepts with insufficient information to code with codable children: Secondary | ICD-10-CM

## 2020-07-22 DIAGNOSIS — E139 Other specified diabetes mellitus without complications: Secondary | ICD-10-CM | POA: Insufficient documentation

## 2020-07-22 MED ORDER — ACCU-CHEK GUIDE VI STRP: ORAL_STRIP | 5 refills | Status: DC

## 2020-07-22 MED ORDER — ACCU-CHEK GUIDE ME W/DEVICE KIT: 1.0000 | PACK | 0 refills | Status: DC

## 2020-07-22 MED ORDER — ACCU-CHEK GUIDE VI STRP: ORAL_STRIP | 2 refills | Status: DC

## 2020-07-22 MED ORDER — PANCRELIPASE (LIP-PROT-AMYL) 36000-114000 UNITS PO CPEP: ORAL_CAPSULE | ORAL | 2 refills | Status: DC

## 2020-07-22 MED ORDER — INSULIN PEN NEEDLE 32G X 4 MM MISC: 1 refills | Status: DC

## 2020-07-22 MED ORDER — TRESIBA FLEXTOUCH 100 UNIT/ML ~~LOC~~ SOPN: 10.0000 [IU] | PEN_INJECTOR | Freq: Every day | SUBCUTANEOUS | 0 refills | Status: DC

## 2020-07-22 NOTE — Progress Notes (Signed)
Endocrinology Consult Note       07/22/2020, 9:47 AM   Subjective:    Patient ID: John Islam Sr., male    DOB: [birth year].  John Islam Sr. is being seen in consultation for management of currently uncontrolled symptomatic diabetes requested by  Perlie Mayo, NP.   Past Medical History:  Diagnosis Date  . Acute blood loss anemia   . Arthritis   . Benign essential HTN   . Cellulitis 07/20/2018  . Cellulitis in diabetic foot ([place]) 07/20/2018  . Diabetes mellitus without complication ([place])   . Diabetes mellitus, type II ([place]) 07/20/2018  . Dry gangrene ([place]) 07/20/2018  . Emesis   . Family history of cervical cancer   . Heart murmur   . Hyperglycemia   . Hypertension   . Joint pain   . Port-A-Cath in place 05/25/2020  . Unilateral complete BKA, left, initial encounter [facility])     Past Surgical History:  Procedure Laterality Date  . AMPUTATION Left 08/02/2018   Procedure: LEFT AMPUTATION BELOW KNEE;  Surgeon: Marty Heck, MD;  Location: [place];  Service: Vascular;  Laterality: Left;  . BILIARY STENT PLACEMENT N/A 06/11/2020   Procedure: BILIARY STENT PLACEMENT;  Surgeon: Rogene Houston, MD;  [facility];  Service: Endoscopy;  Laterality: N/A;  . ELBOW SURGERY     Bone graft to repair elbow  . ERCP N/A 06/11/2020   Procedure: ENDOSCOPIC RETROGRADE CHOLANGIOPANCREATOGRAPHY (ERCP);  Surgeon: Rogene Houston, MD;  [facility];  Service: Endoscopy;  Laterality: N/A;  . LOWER EXTREMITY ANGIOGRAPHY N/A 07/24/2018   Procedure: LOWER EXTREMITY ANGIOGRAPHY;  Surgeon: Marty Heck, MD;  Location: [place] [facility];  Service: Cardiovascular;  Laterality: N/A;  . PERIPHERAL VASCULAR INTERVENTION  07/24/2018   Procedure: PERIPHERAL VASCULAR INTERVENTION;  Surgeon: Marty Heck, MD;  Location: [place] [facility];  Service: Cardiovascular;;  . PORTACATH  PLACEMENT Left 05/21/2020   Procedure: INSERTION PORT-A-CATH;  Surgeon: Aviva Signs, MD;  [facility];  Service: General;  Laterality: Left;  . SPHINCTEROTOMY  06/11/2020   Procedure: SHORT SPHINCTEROTOMY;  Surgeon: Rogene Houston, MD;  [facility];  Service: Endoscopy;;  . TONSILLECTOMY    . TRANSMETATARSAL AMPUTATION Left 07/25/2018   Procedure: LEFT GREAT TOE AMPUTATION;  Surgeon: Marty Heck, MD;  Location: [place];  Service: Vascular;  Laterality: Left;  . TRANSMETATARSAL AMPUTATION Left 07/30/2018   Procedure: TRANSMETATARSAL AMPUTATION;  Surgeon: Waynetta Sandy, MD;  Location: [place];  Service: Vascular;  Laterality: Left;    Social History   Socioeconomic History  . Marital status: Married    Spouse name: [name]  . Number of children: 4  . Years of education: Not on file  . Highest education level: Not on file  Occupational History  . Not on file  Tobacco Use  . Smoking status: Current Every Day Smoker    Packs/day: 0.50    Types: Cigarettes  . Smokeless tobacco: Former Systems developer    Types: Snuff    Quit date: 04/21/1989  Vaping Use  . Vaping Use: Never used  Substance and  Sexual Activity  . Alcohol use: Not Currently    Alcohol/week: 0.0 standard drinks    Comment: stopped 2 years ago  . Drug use: No  . Sexual activity: Not Currently  Other Topics Concern  . Not on file  Social History Narrative   Lives with wife [name]- married 73 years    4 children with wife. All grown- live close by       15 Cats and 5 Dogs      Enjoys: walking when hip lets him, camping, likes being outside       Diet: eats all food groups    Caffeine: diet sodas 3    Water: 6-8 cups daily -gallon       Wears seat belt    Does not drive- daughter drove him 21 years ago lost license   No smoke detectors due to wood burning stove    Weapons- Sales executive          Social Determinants of Health   Financial Resource Strain: Medium Risk  . Difficulty of Paying  Living Expenses: Somewhat hard  Food Insecurity: No Food Insecurity  . Worried About Charity fundraiser in the Last Year: Never true  . Ran Out of Food in the Last Year: Never true  Transportation Needs: No Transportation Needs  . Lack of Transportation (Medical): No  . Lack of Transportation (Non-Medical): No  Physical Activity: Inactive  . Days of Exercise per Week: 0 days  . Minutes of Exercise per Session: 0 min  Stress: No Stress Concern Present  . Feeling of Stress : Not at all  Social Connections: Moderately Integrated  . Frequency of Communication with Friends and Family: Twice a week  . Frequency of Social Gatherings with Friends and Family: Twice a week  . Attends Religious Services: 1 to 4 times per year  . Active Member of Clubs or Organizations: No  . Attends Archivist Meetings: Never  . Marital Status: Married    Family History  Problem Relation Age of Onset  . Alcoholism Mother   . Alcoholism Father   . Cervical cancer Sister        dx. late 64s  . Cervical cancer Sister        dx. late 54s    Outpatient Encounter Medications as of 07/22/2020  Medication Sig  . acetaminophen (TYLENOL) 500 MG tablet Take 500-1,000 mg by mouth every 6 (six) hours as needed (for pain).   Marland Kitchen aspirin EC 81 MG tablet Take 81 mg by mouth daily. Swallow whole.  . bisacodyl (DULCOLAX) 5 MG EC tablet Take 5 mg by mouth daily as needed for moderate constipation.   . blood glucose meter kit and supplies KIT Dispense based on patient and insurance preference. Use up to four times daily as directed. (FOR ICD-9 250.00, 250.01).  . Blood Glucose Monitoring Suppl (ACCU-CHEK GUIDE ME) w/Device KIT 1 Piece by Does not apply route as directed.  Marland Kitchen dexamethasone (DECADRON) 10 MG/ML injection Inject 10 mg into the vein every 14 (fourteen) days. Dexamethasone 59m in NS 50 ml IVPB prior to chemotherapy administration   . fluorouracil CALGB 846270in sodium chloride 0.9 % 150 mL Inject 2,400  mg/m2 into the vein over 48 hr.   . fosaprepitant (EMEND) 150 MG SOLR injection Inject 150 mg into the vein every 14 (fourteen) days. Prior to chemotherapy administration   . glucose blood (ACCU-CHEK GUIDE) test strip Use as instructed  . insulin degludec (TRESIBA FLEXTOUCH) 100  UNIT/ML FlexTouch Pen Inject 10 Units into the skin daily.  . Insulin Pen Needle 32G X 4 MM MISC Use to inject insulin  . IRINOTECAN HCL IV Inject 280 mg/m2 into the vein every 14 (fourteen) days.   Marland Kitchen LEUCOVORIN CALCIUM IV Inject 400 mg/m2 into the vein every 14 (fourteen) days.   Marland Kitchen lidocaine-prilocaine (EMLA) cream Apply a small amount to port a cath site and cover with plastic wrap 1 hour prior to chemotherapy appointments  . lipase/protease/amylase (CREON) 36000 UNITS CPEP capsule Take 1 capsule (36,000 Units total) by mouth 3 (three) times daily with meals AND 1 capsule (36,000 Units total) with snacks.  . OXALIPLATIN IV Inject 85 mg/m2 into the vein every 14 (fourteen) days.   Marland Kitchen oxyCODONE (ROXICODONE) 5 MG immediate release tablet Take 2 tablets (10 mg total) by mouth every 6 (six) hours as needed for moderate pain or severe pain.  . palonosetron (ALOXI) 0.25 MG/5ML injection Inject 0.25 mg into the vein every 14 (fourteen) days. Prior to chemotherapy administration   . potassium chloride SA (KLOR-CON) 20 MEQ tablet Take 1 tablet (20 mEq total) by mouth 2 (two) times daily.  . prochlorperazine (COMPAZINE) 10 MG tablet Take 1 tablet (10 mg total) by mouth every 6 (six) hours as needed for nausea or vomiting.  . tamsulosin (FLOMAX) 0.4 MG CAPS capsule Take 1 capsule (0.4 mg total) by mouth at bedtime.  . [DISCONTINUED] glipiZIDE (GLUCOTROL) 5 MG tablet Take 1 tablet (5 mg total) by mouth daily before breakfast.  . [DISCONTINUED] metFORMIN (GLUCOPHAGE) 500 MG tablet Take 2 tablets (1,000 mg total) by mouth 2 (two) times daily with a meal.   No facility-administered encounter medications on file as of 07/22/2020.     ALLERGIES: Allergies  Allergen Reactions  . Tea Itching and Rash    VACCINATION STATUS: Immunization History  Administered Date(s) Administered  . Influenza, High Dose Seasonal PF 07/26/2018  . Moderna SARS-COVID-2 Vaccination 12/29/2019, 01/26/2020  . Pneumococcal Polysaccharide-23 07/26/2018    Diabetes He presents for his initial diabetic visit. 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 Onset time: Patient was diagnosed at approximate age of 28 years. His disease course has been worsening. There are no hypoglycemic associated symptoms. Pertinent negatives for hypoglycemia include no confusion, headaches, pallor or seizures. Associated symptoms include blurred vision, foot ulcerations, polydipsia and polyuria. Pertinent negatives for diabetes include no chest pain, no fatigue, no polyphagia and no weakness. There are no hypoglycemic complications. Symptoms are worsening. Diabetic complications include PVD. (He is status post left below-knee amputation.) Risk factors for coronary artery disease include diabetes mellitus, hypertension, male sex, tobacco exposure and sedentary lifestyle. Current diabetic treatments: He is currently on Metformin 1000 mg p.o. twice daily, glipizide 5 mg p.o. daily. His weight is decreasing steadily. He is following a generally unhealthy diet. When asked about meal planning, he reported none. He has not had a previous visit with a dietitian. He never participates in exercise. (He did not bring any logs nor meter with him.  His recent A1c was 10.7%.)     Review of Systems  Constitutional: Negative for chills, fatigue, fever and unexpected weight change.  HENT: Negative for dental problem, mouth sores and trouble swallowing.   Eyes: Positive for blurred vision. Negative for visual disturbance.  Respiratory: Negative for cough, choking, chest tightness, shortness of breath  and wheezing.   Cardiovascular: Negative for chest pain, palpitations and leg swelling.  Gastrointestinal:  Negative for abdominal distention, abdominal pain, constipation, diarrhea, nausea and vomiting.       He reports considerable difficulty swallowing both solids and liquids.  Endocrine: Positive for polydipsia and polyuria. Negative for polyphagia.  Genitourinary: Negative for dysuria, flank pain, hematuria and urgency.  Musculoskeletal: Positive for gait problem. Negative for back pain, myalgias and neck pain.       He is status post left below-knee amputation.  Skin: Negative for pallor, rash and wound.  Neurological: Negative for seizures, syncope, weakness, numbness and headaches.  Psychiatric/Behavioral: Negative for confusion and dysphoric mood.    Objective:    Vitals with BMI 07/22/2020 07/21/2020 07/21/2020  Height _0  - -  Weight 134 lbs - 131 lbs 10 oz  BMI 17.7 - -  Systolic 939 030 092  Diastolic 77 87 74  Pulse 87 83 93    BP 135/77   Pulse 87   Ht _1  (1.803 m)   Wt 134 lb (60.8 kg)   BMI 18.69 kg/m   Wt Readings from Last 3 Encounters:  07/22/20 134 lb (60.8 kg)  07/21/20 131 lb 9.6 oz (59.7 kg)  07/13/20 133 lb (60.3 kg)     Physical Exam Constitutional:      General: He is not in acute distress.    Appearance: He is well-developed.     Comments: General poor hygiene.  HENT:     Head: Normocephalic and atraumatic.  Neck:     Thyroid: No thyromegaly.     Trachea: No tracheal deviation.  Cardiovascular:     Pulses:          Dorsalis pedis pulses are 1+ on the right side and 1+ on the left side.       Posterior tibial pulses are 1+ on the right side and 1+ on the left side.     Heart sounds: S1 normal and S2 normal. No murmur heard.  No gallop.   Pulmonary:     Effort: Pulmonary effort is normal. No respiratory distress.     Breath sounds: No wheezing.  Abdominal:     General: There is no distension.     Tenderness: There is no  abdominal tenderness. There is no guarding.  Musculoskeletal:     Right shoulder: No swelling or deformity.     Cervical back: Normal range of motion and neck supple.     Comments: Left below-knee amputation.  Skin:    General: Skin is warm and dry.     Findings: No rash.     Nails: There is no clubbing.  Neurological:     Mental Status: He is alert and oriented to person, place, and time.     Cranial Nerves: No cranial nerve deficit.     Sensory: No sensory deficit.     Gait: Gait normal.     Deep Tendon Reflexes: Reflexes are normal and symmetric.  Psychiatric:        Speech: Speech normal.        Behavior: Behavior normal. Behavior is cooperative.        Thought Content: Thought content normal.        Judgment: Judgment normal.     Comments: Patient has reluctant affect.      CMP ( most recent) CMP     Component Value Date/Time   NA 134 (L) 07/21/2020 0811   K 3.7 07/21/2020 0811   CL 99 07/21/2020 0811   CO2 27 07/21/2020 0811   GLUCOSE 290 (H)  07/21/2020 1056   BUN 8 07/21/2020 0811   CREATININE 0.48 (L) 07/21/2020 0811   CALCIUM 8.5 (L) 07/21/2020 0811   PROT 6.8 07/21/2020 0811   ALBUMIN 2.5 (L) 07/21/2020 0811   AST 48 (H) 07/21/2020 0811   ALT 37 07/21/2020 0811   ALKPHOS 426 (H) 07/21/2020 0811   BILITOT 1.4 (H) 07/21/2020 0811   GFRNONAA >60 07/21/2020 0811   GFRAA >60 06/22/2020 0758     Diabetic Labs (most recent): Lab Results  Component Value Date   HGBA1C 10.8 (H) 04/24/2020   HGBA1C 7.4 (H) 07/21/2018   HGBA1C 7.3 (H) 07/20/2018     Lipid Panel     Component Value Date/Time   CHOL 103 07/24/2018 0441   TRIG 53 07/24/2018 0441   HDL 40 (L) 07/24/2018 0441   CHOLHDL 2.6 07/24/2018 0441   VLDL 11 07/24/2018 0441   LDLCALC 52 07/24/2018 0441    Assessment & Plan:   1. 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 has currently uncontrolled symptomatic pancreatic diabetes related to  his pancreatic cancer with most recent A1c of 10.7%.    - I had a long discussion with him about the  the pathology behind his diabetes and its complications. -his diabetes is complicated by his advanced cancer with difficulty eating, weight deficit and he remains at a high risk for more acute and chronic complications which include CAD, CVA, CKD, retinopathy, and neuropathy. These are all discussed in detail with him.  -This patient is in desperate need of proper nutrition, advised to increase his intake of carbs, proteins, and appropriate fats.    - I have approached him with the following individualized plan to manage  his diabetes and patient agrees:   -He is not a candidate for non-insulin management options.  I discussed and initiated Tresiba 10 units nightly associated with monitoring of blood glucose 4 times a day-before meals and at bedtime and return in 1 week with his meter and logs.  He will likely require multiple daily injections of insulin in order for him to achieve control of diabetes to target. - he is encouraged to call clinic for blood glucose levels less than 70 or above 300 mg /dl. -He may benefit from a CGM, will be considered next visit. -In light of his pancreatic cancer and possible insufficiency he is advised to discontinue Metformin, glipizide. -He is not a suitable candidate for SGLT2 inhibitors nor incretin therapy. - Specific targets for  A1c;  LDL, HDL,  and Triglycerides were discussed with the patient.  2) Blood Pressure /Hypertension:  his blood pressure is  controlled to target.   3) Lipids/Hyperlipidemia:   Review of his recent lipid panel showed  controlled  LDL at 52 .  he  is not on statins.  He will not need it for now.     4) exocrine pancreatic insufficiency :  Body mass index is 18.69 kg/m.  -He has significant weight deficit.  He will benefit from Creon intervention.  I discussed and prescribed Creon 36,000 units 3 -5 times a day with meals and  snacks. -He describes eating/swallowing difficulty, may need other feeding mechanisms.  5) Chronic Care/Health Maintenance:  -he  is encouraged to initiate and continue to follow up with Ophthalmology, Dentist,  Podiatrist at least yearly or according to recommendations, and advised to  quit stay away from smoking. I have recommended yearly flu vaccine and pneumonia vaccine at least every 5 years; moderate  intensity exercise for up to 150 minutes weekly; and  sleep for at least 7 hours a day.  He is a chronic heavy smoker. The patient was counseled on the dangers of tobacco use, and was advised to quit.  Reviewed strategies to maximize success, including removing cigarettes and smoking materials from environment.   - he is  advised to maintain close follow up with Perlie Mayo, NP for primary care needs, as well as his other providers for optimal and coordinated care.   - Time spent in this patient care: 60 min, of which > 50% was spent in  counseling  him about his pancreatic diabetes, exocrine pancreatic insufficiency and the rest reviewing his blood glucose logs , discussing his hypoglycemia and hyperglycemia episodes, reviewing his current and  previous labs / studies  ( including abstraction from other facilities) and medications  doses and developing a  long term treatment plan based on the latest standards of care/ guidelines; and documenting his care.    Please refer to Patient Instructions for Blood Glucose Monitoring and Insulin/Medications Dosing Guide"  in media tab for additional information. Please  also refer to " Patient Self Inventory" in the Media  tab for reviewed elements of pertinent patient history.  John Islam Sr. participated in the discussions, expressed understanding, and voiced agreement with the above plans.  All questions were answered to his satisfaction. he is encouraged to contact clinic should he have any questions or concerns prior to his return  visit.   Follow up plan: - Return in about 1 week (around 07/29/2020) for Bring Meter and Logs- A1c in Office.  Glade Lloyd, MD [facility] [address] [place], [place] [address] Phone: [phone]  Fax: [phone]    07/22/2020, 9:47 AM  This note was partially dictated with voice recognition software. Similar sounding words can be transcribed inadequately or may not  be corrected upon review.

## 2020-07-23 ENCOUNTER — Inpatient Hospital Stay (HOSPITAL_COMMUNITY): Payer: Medicare Other

## 2020-07-23 ENCOUNTER — Other Ambulatory Visit: Payer: Self-pay

## 2020-07-23 ENCOUNTER — Encounter (HOSPITAL_COMMUNITY): Payer: Self-pay

## 2020-07-23 VITALS — BP 103/76 | HR 92 | Temp 97.0°F | Resp 16

## 2020-07-23 DIAGNOSIS — Z95828 Presence of other vascular implants and grafts: Secondary | ICD-10-CM

## 2020-07-23 DIAGNOSIS — C259 Malignant neoplasm of pancreas, unspecified: Secondary | ICD-10-CM

## 2020-07-23 DIAGNOSIS — Z5111 Encounter for antineoplastic chemotherapy: Secondary | ICD-10-CM | POA: Diagnosis not present

## 2020-07-23 MED ORDER — SODIUM CHLORIDE 0.9% FLUSH
10.0000 mL | INTRAVENOUS | Status: DC | PRN
  Administered 2020-07-23: 10 mL

## 2020-07-23 MED ORDER — HEPARIN SOD (PORK) LOCK FLUSH 100 UNIT/ML IV SOLN
500.0000 [IU] | Freq: Once | INTRAVENOUS | Status: AC | PRN
  Administered 2020-07-23: 500 [IU]

## 2020-07-23 NOTE — Patient Instructions (Signed)
[facility] at [facility] Discharge Instructions  5FU pump discontinued with portacath flushed per protocol. Follow-up as scheduled   Thank you for choosing [facility] at [facility] to provide your oncology and hematology care.  To afford each patient quality time with our provider, please arrive at least 15 minutes before your scheduled appointment time.   If you have a lab appointment with the Cancer Center please come in thru the Main Entrance and check in at the main information desk.  You need to re-schedule your appointment should you arrive 10 or more minutes late.  We strive to give you quality time with our providers, and arriving late affects you and other patients whose appointments are after yours.  Also, if you no show three or more times for appointments you may be dismissed from the clinic at the providers discretion.     Again, thank you for choosing [facility].  Our hope is that these requests will decrease the amount of time that you wait before being seen by our physicians.       _____________________________________________________________  Should you have questions after your visit to [facility], please contact our office at [phone] and follow the prompts.  Our office hours are 8:00 a.m. and 4:30 p.m. Monday - Friday.  Please note that voicemails left after 4:00 p.m. may not be returned until the following business day.  We are closed weekends and major holidays.  You do have access to a nurse 24-7, just call the main number to the clinic [phone] and do not press any options, hold on the line and a nurse will answer the phone.    For prescription refill requests, have your pharmacy contact our office and allow 72 hours.    Due to Covid, you will need to wear a mask upon entering the hospital. If you do not have a mask, a mask will be given to you at the Main Entrance upon arrival. For doctor  visits, patients may have 1 support person age 18 or older with them. For treatment visits, patients can not have anyone with them due to social distancing guidelines and our immunocompromised population.     

## 2020-07-23 NOTE — Progress Notes (Signed)
John Islam Sr. tolerated 5FU pump well without complaints or incident. 5FU pump discontinued with portacath flushed easily per protocol then de-accessed. VSS Pt discharged via wheelchair in satisfactory condition accompanied by family member

## 2020-07-28 ENCOUNTER — Telehealth (INDEPENDENT_AMBULATORY_CARE_PROVIDER_SITE_OTHER): Payer: Medicare Other | Admitting: Family Medicine

## 2020-07-28 ENCOUNTER — Encounter: Payer: Self-pay | Admitting: Family Medicine

## 2020-07-28 VITALS — Ht 71.0 in | Wt 131.0 lb

## 2020-07-28 DIAGNOSIS — T451X5A Adverse effect of antineoplastic and immunosuppressive drugs, initial encounter: Secondary | ICD-10-CM | POA: Insufficient documentation

## 2020-07-28 DIAGNOSIS — K1379 Other lesions of oral mucosa: Secondary | ICD-10-CM | POA: Diagnosis not present

## 2020-07-28 DIAGNOSIS — E1142 Type 2 diabetes mellitus with diabetic polyneuropathy: Secondary | ICD-10-CM | POA: Diagnosis not present

## 2020-07-28 NOTE — Progress Notes (Signed)
Virtual Visit via Telephone Note   This visit type was conducted due to national recommendations for restrictions regarding the COVID-19 Pandemic (e.g. social distancing) in an effort to limit this patient's exposure and mitigate transmission in our community.  Due to his co-morbid illnesses, this patient is at least at moderate risk for complications without adequate follow up.  This format is felt to be most appropriate for this patient at this time.  The patient did not have access to video technology/had technical difficulties with video requiring transitioning to audio format only (telephone).  All issues noted in this document were discussed and addressed.  No physical exam could be performed with this format.    Evaluation Performed:  Follow-up visit  Date:  07/28/2020   ID:  John Islam Sr., DOB [birth year], MRN [id number]  Patient Location: Home Provider Location: Other:  offsite  Location of Patient: Home Location of Provider: Telehealth Consent was obtain for visit to be over via telehealth. I verified that I am speaking with the correct person using two identifiers.  PCP:  Perlie Mayo, NP   Chief Complaint: Diabetic follow-up  History of Present Illness:    John Yardley Sr. is a 69 y.o. male with history as stated below.  Reports today for diabetic follow-up.  He is in with endocrinology now.  He is on cancer treatments and receiving Decadron and is partially unaware of how to portion control and what types of foods to eat.  He does have home health coming out with a portion plate to help him learn how to portion control.  Dr. Dorris Fetch has increased his Tyler Aas to 20 units starting this week.  He has follow-up with him.  He reports his blood sugars running 200 and higher.  Up to 400.  He denies having hypoglycemia.  Reports having polydipsia or polyuria or polyphagia.  He denies having any nonhealing wounds.  Denies having any UTI or other signs of infection.  He is not  following the best diet as since he is on chemo he does not have much of an appetite so he can eats what he would like.  He reports he could do better about drinking water.  Other concerns today include having numbness and tingling in hands and feet.  Which he reports started a couple days ago.  He has not notified his chemotherapy doctor.  He possibly has some peripheral neuropathy secondary to his diabetes but also lung cancer treatment this could be a worsening side effect as he reports he was not having any trouble until few days ago.  He also reports that his mouth is sore when he drinks something cold.  Nothing when he swallows nothing when it is warm.  He also has not reported this to his chemo doctor at this time.  He denies having any active chest pain, new cough or worsening shortness of breath.  No changes in headaches, vision changes or dizziness.   The patient does not have symptoms concerning for COVID-19 infection (fever, chills, cough, or new shortness of breath).   Past Medical, Surgical, Social History, Allergies, and Medications have been Reviewed.  Past Medical History:  Diagnosis Date  . Acute blood loss anemia   . Arthritis   . Benign essential HTN   . Cellulitis 07/20/2018  . Cellulitis in diabetic foot ([place]) 07/20/2018  . Diabetes mellitus without complication ([place])   . Diabetes mellitus, type II ([place]) 07/20/2018  . Dry gangrene ([place]) 07/20/2018  .  Emesis   . Family history of cervical cancer   . Heart murmur   . Hyperglycemia   . Hypertension   . Joint pain   . Port-A-Cath in place 05/25/2020  . Unilateral complete BKA, left, initial encounter [facility])    Past Surgical History:  Procedure Laterality Date  . AMPUTATION Left 08/02/2018   Procedure: LEFT AMPUTATION BELOW KNEE;  Surgeon: Marty Heck, MD;  Location: [place];  Service: Vascular;  Laterality: Left;  . BILIARY STENT PLACEMENT N/A 06/11/2020   Procedure: BILIARY STENT PLACEMENT;  Surgeon: Rogene Houston, MD;  [facility];  Service: Endoscopy;  Laterality: N/A;  . ELBOW SURGERY     Bone graft to repair elbow  . ERCP N/A 06/11/2020   Procedure: ENDOSCOPIC RETROGRADE CHOLANGIOPANCREATOGRAPHY (ERCP);  Surgeon: Rogene Houston, MD;  [facility];  Service: Endoscopy;  Laterality: N/A;  . LOWER EXTREMITY ANGIOGRAPHY N/A 07/24/2018   Procedure: LOWER EXTREMITY ANGIOGRAPHY;  Surgeon: Marty Heck, MD;  Location: [place] [facility];  Service: Cardiovascular;  Laterality: N/A;  . PERIPHERAL VASCULAR INTERVENTION  07/24/2018   Procedure: PERIPHERAL VASCULAR INTERVENTION;  Surgeon: Marty Heck, MD;  Location: [place] [facility];  Service: Cardiovascular;;  . PORTACATH PLACEMENT Left 05/21/2020   Procedure: INSERTION PORT-A-CATH;  Surgeon: Aviva Signs, MD;  [facility];  Service: General;  Laterality: Left;  . SPHINCTEROTOMY  06/11/2020   Procedure: SHORT SPHINCTEROTOMY;  Surgeon: Rogene Houston, MD;  [facility];  Service: Endoscopy;;  . TONSILLECTOMY    . TRANSMETATARSAL AMPUTATION Left 07/25/2018   Procedure: LEFT GREAT TOE AMPUTATION;  Surgeon: Marty Heck, MD;  Location: [place];  Service: Vascular;  Laterality: Left;  . TRANSMETATARSAL AMPUTATION Left 07/30/2018   Procedure: TRANSMETATARSAL AMPUTATION;  Surgeon: Waynetta Sandy, MD;  Location: [place];  Service: Vascular;  Laterality: Left;     Current Meds  Medication Sig  . Accu-Chek Softclix Lancets lancets 1 each by Other route in the morning, at noon, in the evening, and at bedtime. Dx: E11.65  . acetaminophen (TYLENOL) 500 MG tablet Take 500-1,000 mg by mouth every 6 (six) hours as needed (for pain).   . bisacodyl (DULCOLAX) 5 MG EC tablet Take 5 mg by mouth daily as needed for moderate constipation.   . blood glucose meter kit and supplies KIT Dispense based on patient and insurance preference. Use up to four times daily as directed. (FOR ICD-9 250.00,  250.01).  . Blood Glucose Monitoring Suppl (ACCU-CHEK GUIDE ME) w/Device KIT 1 Piece by Does not apply route as directed.  Marland Kitchen dexamethasone (DECADRON) 10 MG/ML injection Inject 10 mg into the vein every 14 (fourteen) days. Dexamethasone 10mg  in NS 50 ml IVPB prior to chemotherapy administration   . fluorouracil CALGB 32440 in sodium chloride 0.9 % 150 mL Inject 2,400 mg/m2 into the vein over 48 hr.   . fosaprepitant (EMEND) 150 MG SOLR injection Inject 150 mg into the vein every 14 (fourteen) days. Prior to chemotherapy administration   . glucose blood (ACCU-CHEK GUIDE) test strip Test BG 4 x daily. DX E11.65  . insulin degludec (TRESIBA FLEXTOUCH) 100 UNIT/ML FlexTouch Pen Inject 10 Units into the skin daily.  . Insulin Pen Needle 32G X 4 MM MISC Use to inject insulin  . IRINOTECAN HCL IV Inject 280 mg/m2 into the vein every 14 (fourteen) days.   Marland Kitchen LEUCOVORIN CALCIUM IV Inject 400 mg/m2 into the vein every 14 (fourteen) days.   Marland Kitchen  lidocaine-prilocaine (EMLA) cream Apply a small amount to port a cath site and cover with plastic wrap 1 hour prior to chemotherapy appointments  . lipase/protease/amylase (CREON) 36000 UNITS CPEP capsule Take 1 capsule (36,000 Units total) by mouth 3 (three) times daily with meals AND 1 capsule (36,000 Units total) with snacks.  . metFORMIN (GLUCOPHAGE) 500 MG tablet Take 1,000 mg by mouth 2 (two) times daily.  . OXALIPLATIN IV Inject 85 mg/m2 into the vein every 14 (fourteen) days.   Marland Kitchen oxyCODONE (ROXICODONE) 5 MG immediate release tablet Take 2 tablets (10 mg total) by mouth every 6 (six) hours as needed for moderate pain or severe pain.  . palonosetron (ALOXI) 0.25 MG/5ML injection Inject 0.25 mg into the vein every 14 (fourteen) days. Prior to chemotherapy administration   . potassium chloride SA (KLOR-CON) 20 MEQ tablet Take 1 tablet (20 mEq total) by mouth 2 (two) times daily.  . prochlorperazine (COMPAZINE) 10 MG tablet Take 1 tablet (10 mg total) by mouth every 6  (six) hours as needed for nausea or vomiting.  . tamsulosin (FLOMAX) 0.4 MG CAPS capsule Take 1 capsule (0.4 mg total) by mouth at bedtime.     Allergies:   Tea   ROS:   Please see the history of present illness.    All other systems reviewed and are negative.   Labs/Other Tests and Data Reviewed:    Recent Labs: 07/21/2020: ALT 37; BUN 8; Creatinine, Ser 0.48; Hemoglobin 10.8; Magnesium 2.0; Platelets 267; Potassium 3.7; Sodium 134   Recent Lipid Panel Lab Results  Component Value Date/Time   CHOL 103 07/24/2018 04:41 AM   TRIG 53 07/24/2018 04:41 AM   HDL 40 (L) 07/24/2018 04:41 AM   CHOLHDL 2.6 07/24/2018 04:41 AM   LDLCALC 52 07/24/2018 04:41 AM    Wt Readings from Last 3 Encounters:  07/28/20 131 lb (59.4 kg)  07/22/20 134 lb (60.8 kg)  07/21/20 131 lb 9.6 oz (59.7 kg)     Objective:    Vital Signs:  Ht $R'5\' 11"'dE$  (1.803 m)   Wt 131 lb (59.4 kg)   BMI 18.27 kg/m    VITAL SIGNS:  reviewed GEN:  no acute distress RESPIRATORY:  no shortness of breath noted in conversation  PSYCH:  normal affect  ASSESSMENT & PLAN:    1. Type 2 diabetes mellitus with peripheral neuropathy (HCC)   2. Mouth sore secondary to chemotherapy    Time:   Today, I have spent 10 minutes with the patient with telehealth technology discussing the above problems.     Medication Adjustments/Labs and Tests Ordered: Current medicines are reviewed at length with the patient today.  Concerns regarding medicines are outlined above.   Tests Ordered: No orders of the defined types were placed in this encounter.   Medication Changes: No orders of the defined types were placed in this encounter.    Note: This dictation was prepared with Dragon dictation along with smaller phrase technology. Similar sounding words can be transcribed inadequately or may not be corrected upon review. Any transcriptional errors that result from this process are unintentional.      Disposition:  Follow up  April  Signed, Perlie Mayo, NP  07/28/2020 10:00 AM     [place]

## 2020-07-28 NOTE — Patient Instructions (Signed)
  HAPPY FALL!  I appreciate the opportunity to provide you with care for your health and wellness. Today we discussed: Diabetes, neuropathy and mouth soreness  Follow up: April  No labs or referrals today  Please make sure that she reach out to your chemotherapy provider to see if this neuropathy and mouth soreness is directly related to chemotherapy so that she can receive treatment as needed.  Please continue to practice social distancing to keep you, your family, and our community safe.  If you must go out, please wear a mask and practice good handwashing.  It was a pleasure to see you and I look forward to continuing to work together on your health and well-being. Please do not hesitate to call the office if you need care or have questions about your care.  Have a wonderful day and week. With Gratitude, Cherly Beach, DNP, AGNP-BC

## 2020-07-28 NOTE — Assessment & Plan Note (Signed)
Questionable worsening peripheral neuropathy secondary to chemo.  Diabetes is significantly uncontrolled but is on Decadron.  Being followed closely by Dr. Dorris Fetch.  Advised for him to follow Dr. Liliane Channel plan of care.  And to work on diet as best as he can.  No medications provided at this time.  As I wanted him to follow-up with his cancer doctor to see if this could be a worsening component as it just recently flared up.  He is in agreements with this plan of care.

## 2020-07-28 NOTE — Assessment & Plan Note (Signed)
Mouth is sore predominantly with cold liquids.  Reports that it is sharp and like razor blades.  Denies have any trouble swallowing.  Has not spoke to his chemotherapy provider yet about this possible need for Magic mouthwash/lidocaine swish so that he can numb up his mouth before he eats or drinks anything as this could be continuing to be an issue secondary to his chemotherapy.  I advised for him to reach out to his chemotherapy provider.

## 2020-07-29 ENCOUNTER — Inpatient Hospital Stay (HOSPITAL_COMMUNITY): Payer: Medicare Other | Attending: Hematology

## 2020-07-29 ENCOUNTER — Other Ambulatory Visit: Payer: Self-pay

## 2020-07-29 ENCOUNTER — Inpatient Hospital Stay (HOSPITAL_BASED_OUTPATIENT_CLINIC_OR_DEPARTMENT_OTHER): Payer: Medicare Other | Admitting: Oncology

## 2020-07-29 VITALS — BP 119/67 | HR 79 | Temp 96.9°F | Resp 18 | Wt 129.6 lb

## 2020-07-29 DIAGNOSIS — M25512 Pain in left shoulder: Secondary | ICD-10-CM

## 2020-07-29 DIAGNOSIS — C787 Secondary malignant neoplasm of liver and intrahepatic bile duct: Secondary | ICD-10-CM | POA: Diagnosis not present

## 2020-07-29 DIAGNOSIS — Z5111 Encounter for antineoplastic chemotherapy: Secondary | ICD-10-CM | POA: Diagnosis present

## 2020-07-29 DIAGNOSIS — Z95828 Presence of other vascular implants and grafts: Secondary | ICD-10-CM | POA: Diagnosis not present

## 2020-07-29 DIAGNOSIS — C259 Malignant neoplasm of pancreas, unspecified: Secondary | ICD-10-CM | POA: Diagnosis not present

## 2020-07-29 DIAGNOSIS — R350 Frequency of micturition: Secondary | ICD-10-CM | POA: Insufficient documentation

## 2020-07-29 DIAGNOSIS — E876 Hypokalemia: Secondary | ICD-10-CM | POA: Insufficient documentation

## 2020-07-29 LAB — COMPREHENSIVE METABOLIC PANEL
ALT: 43 U/L (ref 0–44)
AST: 51 U/L — ABNORMAL HIGH (ref 15–41)
Albumin: 2.8 g/dL — ABNORMAL LOW (ref 3.5–5.0)
Alkaline Phosphatase: 333 U/L — ABNORMAL HIGH (ref 38–126)
Anion gap: 11 (ref 5–15)
BUN: 11 mg/dL (ref 8–23)
CO2: 26 mmol/L (ref 22–32)
Calcium: 8.9 mg/dL (ref 8.9–10.3)
Chloride: 101 mmol/L (ref 98–111)
Creatinine, Ser: 0.54 mg/dL — ABNORMAL LOW (ref 0.61–1.24)
GFR, Estimated: >60 mL/min (ref >60)
Glucose, Bld: 98 mg/dL (ref 70–99)
Potassium: 3.1 mmol/L — ABNORMAL LOW (ref 3.5–5.1)
Sodium: 138 mmol/L (ref 135–145)
Total Bilirubin: 1.4 mg/dL — ABNORMAL HIGH (ref 0.3–1.2)
Total Protein: 6.9 g/dL (ref 6.5–8.1)

## 2020-07-29 LAB — CBC WITH DIFFERENTIAL/PLATELET
Abs Immature Granulocytes: 0.06 10*3/uL (ref 0.00–0.07)
Basophils Absolute: 0.1 10*3/uL (ref 0.0–0.1)
Basophils Relative: 1 %
Eosinophils Absolute: 0.3 10*3/uL (ref 0.0–0.5)
Eosinophils Relative: 3 %
HCT: 37.2 % — ABNORMAL LOW (ref 39.0–52.0)
Hemoglobin: 11.6 g/dL — ABNORMAL LOW (ref 13.0–17.0)
Immature Granulocytes: 1 %
Lymphocytes Relative: 14 %
Lymphs Abs: 1.5 10*3/uL (ref 0.7–4.0)
MCH: 28.6 pg (ref 26.0–34.0)
MCHC: 31.2 g/dL (ref 30.0–36.0)
MCV: 91.6 fL (ref 80.0–100.0)
Monocytes Absolute: 0.8 10*3/uL (ref 0.1–1.0)
Monocytes Relative: 8 %
Neutro Abs: 7.5 10*3/uL (ref 1.7–7.7)
Neutrophils Relative %: 73 %
Platelets: 206 10*3/uL (ref 150–400)
RBC: 4.06 MIL/uL — ABNORMAL LOW (ref 4.22–5.81)
RDW: 16.6 % — ABNORMAL HIGH (ref 11.5–15.5)
WBC: 10.1 10*3/uL (ref 4.0–10.5)
nRBC: 0 % (ref 0.0–0.2)

## 2020-07-29 LAB — MAGNESIUM: Magnesium: 1.8 mg/dL (ref 1.7–2.4)

## 2020-07-29 NOTE — Progress Notes (Signed)
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 The report date is 06/18/2020.   The Common Hereditary Cancers Panel offered by [facility] includes sequencing and/or deletion duplication testing of the following 48 genes: APC, ATM, AXIN2, BARD1, BMPR1A, BRCA1, BRCA2, BRIP1, CDH1, CDK4, CDKN2A (p14ARF), CDKN2A (p16INK4a), CHEK2, CTNNA1, DICER1, EPCAM (Deletion/duplication testing only), GREM1 (promoter region deletion/duplication testing only), KIT, MEN1, MLH1, MSH2, MSH3, MSH6, MUTYH, NBN, NF1, NTHL1, PALB2, PDGFRA, PMS2, POLD1, POLE, PTEN, RAD50, RAD51C, RAD51D, RNF43, SDHB, SDHC, SDHD, SMAD4, SMARCA4. STK11, TP53, TSC1, TSC2, and VHL.  The following genes were evaluated for sequence changes only: SDHA and HOXB13 c.251G>A variant only. The Pancreatic Cancer Panel offered by [facility] includes sequencing and deletion/duplication analysis of the following 29 genes: APC, ATM, BMPR1A, BRCA1, BRCA2, CASR, CDK4, CDKN2A, CFTR, CPA1, CTRC, EPCAM, FANCC, MEN1, MLH1, MSH2, MSH6, NF1, PALB2, PALLD, PMS2, PRSS1, SMAD4, SPINK1, STK11, TP53, TSC1, TSC2, and VHL.   06/23/2020 -  Chemotherapy   The patient had palonosetron (ALOXI) injection 0.25 mg, 0.25 mg, Intravenous,  Once, 3 of 6  cycles Administration: 0.25 mg (06/23/2020), 0.25 mg (07/07/2020), 0.25 mg (07/21/2020) irinotecan (CAMPTOSAR) 160 mg in sodium chloride 0.9 % 500 mL chemo infusion, 90 mg/m2 = 160 mg (60 % of original dose 150 mg/m2), Intravenous,  Once, 2 of 5 cycles Dose modification: 90 mg/m2 (60 % of original dose 150 mg/m2, Cycle 2, Reason: Change in LFTs), 100 mg/m2 (original dose 150 mg/m2, Cycle 3, Reason: Provider Judgment) Administration: 160 mg (07/07/2020), 180 mg (07/21/2020) oxaliplatin (ELOXATIN) 115 mg in dextrose 5 % 500 mL chemo infusion, 63.75 mg/m2 = 115 mg (75 % of original dose 85 mg/m2), Intravenous,  Once, 3 of 6 cycles Dose modification: 63.75 mg/m2 (75 % of original dose 85 mg/m2, Cycle 1, Reason: Provider Judgment), 68 mg/m2 (80 % of original dose 85 mg/m2, Cycle 2, Reason: Provider Judgment) Administration: 115 mg (06/23/2020), 125 mg (07/07/2020), 150 mg (07/21/2020) fosaprepitant (EMEND) 150 mg in sodium chloride 0.9 % 145 mL IVPB, 150 mg, Intravenous,  Once, 3 of 6 cycles Administration: 150 mg (06/23/2020), 150 mg (07/07/2020), 150 mg (07/21/2020) fluorouracil (ADRUCIL) 2,500 mg in sodium chloride 0.9 % 100 mL chemo infusion, 1,370 mg/m2 = 2,650 mg (60 % of original dose 2,400 mg/m2), Intravenous, 1 Day/Dose, 3 of 6 cycles Dose modification: 1,800 mg/m2 (75 % of original dose 2,400 mg/m2, Cycle 1, Reason: Provider Judgment), 1,440 mg/m2 (60 % of original dose 2,400 mg/m2, Cycle 1, Reason: Change in LFTs) Administration: 2,500 mg (06/23/2020), 4,400 mg (07/07/2020), 4,400 mg (07/21/2020) leucovorin 440 mg in dextrose 5 % 250 mL infusion, 240 mg/m2 = 440 mg (60 % of original dose 400 mg/m2), Intravenous,  Once, 3 of 6 cycles Dose modification: 300 mg/m2 (75 % of original dose 400 mg/m2, Cycle 1, Reason: Provider Judgment),  240 mg/m2 (60 % of original dose 400 mg/m2, Cycle 1, Reason: Change in LFTs) Administration: 440 mg (06/23/2020), 732 mg (07/07/2020), 732 mg (07/21/2020)  for  chemotherapy treatment.      CANCER STAGING: Cancer Staging No matching staging information was found for the patient.  INTERVAL HISTORY:  Mr. John Carchi Sr., a 69 y.o. male, returns for follow-up and lab work in between cycles. [name] was last seen on 07/21/2020.  S/p 3 cycles of FOLFIRINOX last given on 07/21/2020.  Overall, he tells me he has been fair.  He continues to have abdominal pain and left shoulder pain.  Abdominal pain is chronic.  Left shoulder pain started after Port-A-Cath was placed.  Reports it feels sore and is "pushing into his back ".  He denies any shortness of breath.  He is not on anticoagulation.  He reports compliance with his diabetic medication.  His appetite is 50% energy levels are 70%.  His weight is down 5 additional pounds.  He is having trouble chewing secondary to his teeth.  He is drinking supplements.  Admits to some numbness and tingling in upper and lower extremities.  Symptoms started approximately 2 days ago.  He denies any additional GI symptoms.    REVIEW OF SYSTEMS:  Review of Systems  Constitutional: Positive for appetite change and fatigue. Negative for chills and fever.  HENT:  Negative.  Negative for hearing loss, lump/mass, mouth sores and nosebleeds.   Eyes: Negative.  Negative for eye problems.  Respiratory: Negative for cough, hemoptysis and shortness of breath.   Cardiovascular: Negative.  Negative for chest pain and leg swelling.  Gastrointestinal: Positive for abdominal pain and constipation. Negative for blood in stool, diarrhea, nausea and vomiting.  Endocrine: Negative.  Negative for hot flashes.  Genitourinary: Negative.  Negative for bladder incontinence, difficulty urinating, dysuria, frequency and hematuria.   Musculoskeletal: Negative.  Negative for back pain, flank pain, gait problem and myalgias.  Skin: Negative.  Negative for itching and rash.  Neurological: Positive for dizziness and numbness. Negative for gait problem,  headaches and light-headedness.  Hematological: Negative.  Negative for adenopathy.  Psychiatric/Behavioral: Positive for sleep disturbance. Negative for confusion. The patient is not nervous/anxious.     PAST MEDICAL/SURGICAL HISTORY:  Past Medical History:  Diagnosis Date  . Acute blood loss anemia   . Arthritis   . Benign essential HTN   . Cellulitis 07/20/2018  . Cellulitis in diabetic foot ([place]) 07/20/2018  . Diabetes mellitus without complication ([facility])   . Diabetes mellitus, type II ([place]) 07/20/2018  . Dry gangrene ([place]) 07/20/2018  . Emesis   . Family history of cervical cancer   . Heart murmur   . Hyperglycemia   . Hypertension   . Joint pain   . Port-A-Cath in place 05/25/2020  . Unilateral complete BKA, left, initial encounter [facility])    Past Surgical History:  Procedure Laterality Date  . AMPUTATION Left 08/02/2018   Procedure: LEFT AMPUTATION BELOW KNEE;  Surgeon: Marty Heck, MD;  Location: [facility];  Service: Vascular;  Laterality: Left;  . BILIARY STENT PLACEMENT N/A 06/11/2020   Procedure: BILIARY STENT PLACEMENT;  Surgeon: Rogene Houston, MD;  [facility];  Service: Endoscopy;  Laterality: N/A;  . ELBOW SURGERY     Bone graft to repair elbow  . ERCP N/A 06/11/2020   Procedure: ENDOSCOPIC RETROGRADE CHOLANGIOPANCREATOGRAPHY (ERCP);  Surgeon: Rogene Houston, MD;  [facility];  Service: Endoscopy;  Laterality: N/A;  . LOWER  EXTREMITY ANGIOGRAPHY N/A 07/24/2018   Procedure: LOWER EXTREMITY ANGIOGRAPHY;  Surgeon: Marty Heck, MD;  Location: [facility];  Service: Cardiovascular;  Laterality: N/A;  . PERIPHERAL VASCULAR INTERVENTION  07/24/2018   Procedure: PERIPHERAL VASCULAR INTERVENTION;  Surgeon: Marty Heck, MD;  Location: [facility];  Service: Cardiovascular;;  . PORTACATH PLACEMENT Left 05/21/2020   Procedure: INSERTION PORT-A-CATH;  Surgeon: Aviva Signs, MD;  Location: [facility];  Service: General;   Laterality: Left;  . SPHINCTEROTOMY  06/11/2020   Procedure: SHORT SPHINCTEROTOMY;  Surgeon: Rogene Houston, MD;  [facility];  Service: Endoscopy;;  . TONSILLECTOMY    . TRANSMETATARSAL AMPUTATION Left 07/25/2018   Procedure: LEFT GREAT TOE AMPUTATION;  Surgeon: Marty Heck, MD;  Location: [facility];  Service: Vascular;  Laterality: Left;  . TRANSMETATARSAL AMPUTATION Left 07/30/2018   Procedure: TRANSMETATARSAL AMPUTATION;  Surgeon: Waynetta Sandy, MD;  Location: [facility];  Service: Vascular;  Laterality: Left;    SOCIAL HISTORY:  Social History   Socioeconomic History  . Marital status: Married    Spouse name: [name]  . Number of children: 4  . Years of education: Not on file  . Highest education level: Not on file  Occupational History  . Not on file  Tobacco Use  . Smoking status: Current Every Day Smoker    Packs/day: 0.50    Types: Cigarettes  . Smokeless tobacco: Former Systems developer    Types: Snuff    Quit date: 04/21/1989  Vaping Use  . Vaping Use: Never used  Substance and Sexual Activity  . Alcohol use: Not Currently    Alcohol/week: 0.0 standard drinks    Comment: stopped 2 years ago  . Drug use: No  . Sexual activity: Not Currently  Other Topics Concern  . Not on file  Social History Narrative   Lives with wife [name]- married 13 years    4 children with wife. All grown- live close by       15 Cats and 5 Dogs      Enjoys: walking when hip lets him, camping, likes being outside       Diet: eats all food groups    Caffeine: diet sodas 3    Water: 6-8 cups daily -gallon       Wears seat belt    Does not drive- daughter drove him 21 years ago lost license   No smoke detectors due to wood burning stove    Weapons- Sales executive          Social Determinants of Health   Financial Resource Strain: Medium Risk  . Difficulty of Paying Living Expenses: Somewhat hard  Food Insecurity: No Food Insecurity  . Worried About Charity fundraiser in the  Last Year: Never true  . Ran Out of Food in the Last Year: Never true  Transportation Needs: No Transportation Needs  . Lack of Transportation (Medical): No  . Lack of Transportation (Non-Medical): No  Physical Activity: Inactive  . Days of Exercise per Week: 0 days  . Minutes of Exercise per Session: 0 min  Stress: No Stress Concern Present  . Feeling of Stress : Not at all  Social Connections: Moderately Integrated  . Frequency of Communication with Friends and Family: Twice a week  . Frequency of Social Gatherings with Friends and Family: Twice a week  . Attends Religious Services: 1 to 4 times per year  . Active Member of Clubs or Organizations: No  .  Attends Archivist Meetings: Never  . Marital Status: Married  Human resources officer Violence: Not At Risk  . Fear of Current or Ex-Partner: No  . Emotionally Abused: No  . Physically Abused: No  . Sexually Abused: No    FAMILY HISTORY:  Family History  Problem Relation Age of Onset  . Alcoholism Mother   . Alcoholism Father   . Cervical cancer Sister        dx. late 71s  . Cervical cancer Sister        dx. late 56s    CURRENT MEDICATIONS:  Current Outpatient Medications  Medication Sig Dispense Refill  . Accu-Chek Softclix Lancets lancets 1 each by Other route in the morning, at noon, in the evening, and at bedtime. Dx: E11.65    . acetaminophen (TYLENOL) 500 MG tablet Take 500-1,000 mg by mouth every 6 (six) hours as needed (for pain).     . bisacodyl (DULCOLAX) 5 MG EC tablet Take 5 mg by mouth daily as needed for moderate constipation.     . blood glucose meter kit and supplies KIT Dispense based on patient and insurance preference. Use up to four times daily as directed. (FOR ICD-9 250.00, 250.01). 1 each 0  . Blood Glucose Monitoring Suppl (ACCU-CHEK GUIDE ME) w/Device KIT 1 Piece by Does not apply route as directed. 1 kit 0  . dexamethasone (DECADRON) 10 MG/ML injection Inject 10 mg into the vein every 14  (fourteen) days. Dexamethasone 10mg  in NS 50 ml IVPB prior to chemotherapy administration     . fluorouracil CALGB 36644 in sodium chloride 0.9 % 150 mL Inject 2,400 mg/m2 into the vein over 48 hr.     . fosaprepitant (EMEND) 150 MG SOLR injection Inject 150 mg into the vein every 14 (fourteen) days. Prior to chemotherapy administration     . glucose blood (ACCU-CHEK GUIDE) test strip Test BG 4 x daily. DX E11.65 150 each 5  . insulin degludec (TRESIBA FLEXTOUCH) 100 UNIT/ML FlexTouch Pen Inject 10 Units into the skin daily. 15 mL 0  . Insulin Pen Needle 32G X 4 MM MISC Use to inject insulin 100 each 1  . IRINOTECAN HCL IV Inject 280 mg/m2 into the vein every 14 (fourteen) days.     Marland Kitchen LEUCOVORIN CALCIUM IV Inject 400 mg/m2 into the vein every 14 (fourteen) days.     Marland Kitchen lipase/protease/amylase (CREON) 36000 UNITS CPEP capsule Take 1 capsule (36,000 Units total) by mouth 3 (three) times daily with meals AND 1 capsule (36,000 Units total) with snacks. 240 capsule 2  . metFORMIN (GLUCOPHAGE) 500 MG tablet Take 1,000 mg by mouth 2 (two) times daily.    . OXALIPLATIN IV Inject 85 mg/m2 into the vein every 14 (fourteen) days.     Marland Kitchen oxyCODONE (ROXICODONE) 5 MG immediate release tablet Take 2 tablets (10 mg total) by mouth every 6 (six) hours as needed for moderate pain or severe pain. 120 tablet 0  . palonosetron (ALOXI) 0.25 MG/5ML injection Inject 0.25 mg into the vein every 14 (fourteen) days. Prior to chemotherapy administration     . prochlorperazine (COMPAZINE) 10 MG tablet Take 1 tablet (10 mg total) by mouth every 6 (six) hours as needed for nausea or vomiting. 30 tablet 2  . lidocaine-prilocaine (EMLA) cream Apply a small amount to port a cath site and cover with plastic wrap 1 hour prior to chemotherapy appointments (Patient not taking: Reported on 07/29/2020) 30 g 3  . potassium chloride SA (KLOR-CON) 20 MEQ  tablet Take 1 tablet (20 mEq total) by mouth 2 (two) times daily. 8 tablet 0  . tamsulosin  (FLOMAX) 0.4 MG CAPS capsule Take 1 capsule (0.4 mg total) by mouth at bedtime. 30 capsule 2   No current facility-administered medications for this visit.    ALLERGIES:  Allergies  Allergen Reactions  . Tea Itching and Rash    PHYSICAL EXAM:  Performance status (ECOG): 1 - Symptomatic but completely ambulatory  Vitals:   07/29/20 1024  BP: 119/67  Pulse: 79  Resp: 18  Temp: (!) 96.9 F (36.1 C)  SpO2: 100%   Wt Readings from Last 3 Encounters:  07/29/20 129 lb 10.1 oz (58.8 kg)  07/28/20 131 lb (59.4 kg)  07/22/20 134 lb (60.8 kg)   Physical Exam Vitals reviewed.  Constitutional:      Appearance: Normal appearance.  Cardiovascular:     Rate and Rhythm: Normal rate and regular rhythm.     Heart sounds: Normal heart sounds.  Pulmonary:     Effort: Pulmonary effort is normal.     Breath sounds: Normal breath sounds.  Chest:     Comments: Port-a-Cath in L chest Neurological:     General: No focal deficit present.     Mental Status: He is alert and oriented to person, place, and time.  Psychiatric:        Mood and Affect: Mood normal.        Behavior: Behavior normal.     LABORATORY DATA:  I have reviewed the labs as listed.  CBC Latest Ref Rng & Units 07/29/2020 07/21/2020 07/13/2020  WBC 4.0 - 10.5 K/uL 10.1 15.1(H) 14.1(H)  Hemoglobin 13.0 - 17.0 g/dL 11.6(L) 10.8(L) 11.0(L)  Hematocrit 39 - 52 % 37.2(L) 33.7(L) 33.3(L)  Platelets 150 - 400 K/uL 206 267 319   CMP Latest Ref Rng & Units 07/29/2020 07/21/2020 07/21/2020  Glucose 70 - 99 mg/dL 98 290(H) 375(H)  BUN 8 - 23 mg/dL 11 - 8  Creatinine 0.61 - 1.24 mg/dL 0.54(L) - 0.48(L)  Sodium 135 - 145 mmol/L 138 - 134(L)  Potassium 3.5 - 5.1 mmol/L 3.1(L) - 3.7  Chloride 98 - 111 mmol/L 101 - 99  CO2 22 - 32 mmol/L 26 - 27  Calcium 8.9 - 10.3 mg/dL 8.9 - 8.5(L)  Total Protein 6.5 - 8.1 g/dL 6.9 - 6.8  Total Bilirubin 0.3 - 1.2 mg/dL 1.4(H) - 1.4(H)  Alkaline Phos 38 - 126 U/L 333(H) - 426(H)  AST 15 - 41  U/L 51(H) - 48(H)  ALT 0 - 44 U/L 43 - 37    DIAGNOSTIC IMAGING:  I have independently reviewed the scans and discussed with the patient. No results found.   ASSESSMENT:  1. 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 -Current active smoker, 1 pack/day for 47 years. No history of alcohol use. No family history of malignancy. -CT AP with contrast on 04/24/2020 shows 2.2 cm pancreatic head mass, small hypodense lesions throughout the bilateral lower lobes, predominantly in the right lower lobe, largest measuring 1.1 cm. 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 Mildly prominent lymph nodes within the upper abdomen above the pancreas head suspicious for metastatic lymphadenopathy. 5 mm pleural-based right lower lobe nodule. -CA 19-9 is 12. -Liver biopsy on 05/03/2020 consistent with metastatic adenocarcinoma, positive for CK7, CK20, CDX2. Differential diagnosis includes primary upper GI versus pancreaticobiliary. -PET scan  on 05/19/2020 shows FDG avid had a pancreatic mass, multifocal low-attenuation lesions scattered throughout both lobes of the liver compatible with biopsy-proven liver metastasis. No FDG avid lymph nodes in the abdomen. No evidence of metastasis in the chest or bones. 2 small lung nodules within the right lower lobe measuring less than 5 mm. -ERCP and Wallstent placement on 06/11/2020.  2. Poorly controlled diabetes: -Hemoglobin A1c was 10.8 during hospitalization.  3. Abdominal pain: -Highly likely malignancy related. -He is currently on hydrocodone 5/325 1 to 2 tablets every 4 hours as needed. Patient reports that he still has pain but manageable.  4. Right kidney mass: -No further work-up needed if biopsy of liver lesion confirms pancreatic adenocarcinoma.   PLAN:  1.Metastatic pancreatic cancer to the  liver: -He has tolerated cycle 3 reasonably well.  No major GI side effects. -Reviewed labs.  AST is elevated at 51 but stable.  Total bilirubin is improved to 1.4. -RTC 1 weeks for consideration of cycle 4.  2. Poorly controlled diabetes: -He is on Metformin 1000 mg twice daily and glipizide 5 mg in the mornings with breakfast. -He appears to be more compliant with his diabetic medications. -Blood sugar today 98. -He has follow-up with endocrinology next week.  3. Abdominal pain: -Continue oxycodone 10 mg every 6 hours as needed. -He is asking for a refill today. -Reviewed PDMP-okay to refill   4. Right kidney mass: -Right kidney lesion suspicious for RCC.  No work-up needed.  5. Genetic testing: -Inv[facility] testing is negative.  6. Hypokalemia: -Potassium 3.1.    -Continue 40 mEq potassium daily.  Refill sent. -Suggested patient stay for IV fluids + potassium but he would like to take them orally.  7. Urinary frequency: -Continue Flomax 0.4 mg at bedtime. -New prescription sent.  8.  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 -Symptoms started approximately 2 days ago. -Instructed patient to keep an eye on his neuropathy and to let Dr. Delton Coombes know next week.   9.  Weight loss: -Followed by dietitian. -Recommend supplements that are high in protein  10.  Left upper shoulder pain: -Unclear etiology. -Symptoms have been present since his port was placed-05/21/20 -Noted to have good blood return.  -Recommend imaging to rule out blood clot given is on the same side as his Port-A-Cath.   Disposition: -Return to clinic in 1 week for consideration of cycle 3.   Orders placed this encounter:  Orders Placed This Encounter  Procedures  . US Venous Img Upper Uni Left (DVT)   Faythe Casa, NP 07/30/2020 8:06 AM  [place] [phone]

## 2020-07-30 ENCOUNTER — Inpatient Hospital Stay (HOSPITAL_COMMUNITY): Payer: Medicare Other

## 2020-07-30 ENCOUNTER — Ambulatory Visit (HOSPITAL_COMMUNITY)
Admission: RE | Admit: 2020-07-30 | Discharge: 2020-07-30 | Disposition: A | Discharge status: Home / Self Care | Payer: Medicare Other | Source: Ambulatory Visit | Attending: Oncology | Admitting: Oncology

## 2020-07-30 DIAGNOSIS — M25512 Pain in left shoulder: Secondary | ICD-10-CM | POA: Diagnosis not present

## 2020-07-30 DIAGNOSIS — Z95828 Presence of other vascular implants and grafts: Secondary | ICD-10-CM | POA: Diagnosis present

## 2020-07-30 LAB — CANCER ANTIGEN 19-9: CA 19-9: 85 U/mL — ABNORMAL HIGH (ref 0–35)

## 2020-07-30 MED ORDER — POTASSIUM CHLORIDE CRYS ER 20 MEQ PO TBCR: 20.0000 meq | EXTENDED_RELEASE_TABLET | Freq: Two times a day (BID) | ORAL | 0 refills | Status: DC

## 2020-07-30 MED ORDER — OXYCODONE HCL 5 MG PO TABS: 10.0000 mg | ORAL_TABLET | Freq: Four times a day (QID) | ORAL | 0 refills | Status: DC | PRN

## 2020-07-30 MED ORDER — TAMSULOSIN HCL 0.4 MG PO CAPS
0.4000 mg | ORAL_CAPSULE | Freq: Every day | ORAL | 2 refills | Status: DC
Start: 2020-07-30 — End: 2020-09-27

## 2020-07-30 NOTE — Progress Notes (Signed)
Nutrition Follow-up:  Patient with metastatic pancreatic cancer.  Patient receiving folfirinox.  Met with patient in clinic.  Patient reports that he has been told to eat small frequent meals but he feels like he can eat more.  Reports that he ate stew last night (meat and vegetables).  Says that he has mostly been drinking water.  Tried boost glucose control shake and did not like it very much. Says that when he eats after treatment his mouth feels like razor blades are in it.     Medications: tresiba, metformin, glipizide  Labs: reviewed  Anthropometrics:   Weight 129 lb 10.1 oz decreased from 131 lb 9.8 oz on 10/7   NUTRITION DIAGNOSIS: Unintentional weight loss continues   INTERVENTION:  Encouraged patient to focus on sugary free beverages (likes regular soda, fruit punch, juice) and no concentrated sweets diet to help with blood glucose control. Reviewed with patient to avoid anything cold after treatment due to chemotherapy drug.   Provided samples of glucerna for patient to try.  Contact information given to patient.     MONITORING, EVALUATION, GOAL: weight trends, intake   NEXT VISIT: Dec 3 phone f/u  Kathlynn Swofford B. Zenia Resides, [place], [place] Registered Dietitian [phone] (mobile)

## 2020-07-30 NOTE — Progress Notes (Signed)
Would you mind calling the patient and letting him know that he does not have a blood clot in the left arm. I am not sure what is causing the pain but we ruled out blood clot. He can continue his narcotics as needed. Thanks.  Sonia Baller

## 2020-08-02 ENCOUNTER — Other Ambulatory Visit: Payer: Self-pay

## 2020-08-02 ENCOUNTER — Encounter: Payer: Self-pay | Admitting: Nurse Practitioner

## 2020-08-02 ENCOUNTER — Ambulatory Visit (INDEPENDENT_AMBULATORY_CARE_PROVIDER_SITE_OTHER): Payer: Medicare Other | Admitting: Nurse Practitioner

## 2020-08-02 VITALS — BP 101/69 | HR 90 | Ht 71.0 in | Wt 132.0 lb

## 2020-08-02 DIAGNOSIS — K8689 Other specified diseases of pancreas: Secondary | ICD-10-CM | POA: Diagnosis not present

## 2020-08-02 DIAGNOSIS — K8681 Exocrine pancreatic insufficiency: Secondary | ICD-10-CM | POA: Diagnosis not present

## 2020-08-02 DIAGNOSIS — E0865 Diabetes mellitus due to underlying condition with hyperglycemia: Secondary | ICD-10-CM

## 2020-08-02 DIAGNOSIS — IMO0002 Reserved for concepts with insufficient information to code with codable children: Secondary | ICD-10-CM

## 2020-08-02 LAB — POCT GLYCOSYLATED HEMOGLOBIN (HGB A1C): Hemoglobin A1C: 9.9 % — AB (ref 4.0–5.6)

## 2020-08-02 MED ORDER — INSULIN PEN NEEDLE 32G X 4 MM MISC: 1 refills | Status: DC

## 2020-08-02 MED ORDER — TRESIBA FLEXTOUCH 100 UNIT/ML ~~LOC~~ SOPN: 25.0000 [IU] | PEN_INJECTOR | Freq: Every day | SUBCUTANEOUS | 2 refills | Status: DC

## 2020-08-02 NOTE — Patient Instructions (Signed)

## 2020-08-02 NOTE — Progress Notes (Signed)
Endocrinology Follow Up Visit       08/02/2020, 11:37 AM   Subjective:    Patient ID: John Islam Sr., male    DOB: 02-19-1951.  John Islam Sr. is being seen in follow up after being seen in consultation for management of currently uncontrolled symptomatic diabetes requested by  Perlie Mayo, NP.   Past Medical History:  Diagnosis Date   Acute blood loss anemia    Arthritis    Benign essential HTN    Cellulitis 07/20/2018   Cellulitis in diabetic foot ([place]) 07/20/2018   Diabetes mellitus without complication (HCC)    Diabetes mellitus, type II ([place]) 07/20/2018   Dry gangrene ([place]) 07/20/2018   Emesis    Family history of cervical cancer    Heart murmur    Hyperglycemia    Hypertension    Joint pain    Port-A-Cath in place 05/25/2020   Unilateral complete BKA, left, initial encounter [facility])     Past Surgical History:  Procedure Laterality Date   AMPUTATION Left 08/02/2018   Procedure: LEFT AMPUTATION BELOW KNEE;  Surgeon: Marty Heck, MD;  Location: [facility];  Service: Vascular;  Laterality: Left;   BILIARY STENT PLACEMENT N/A 06/11/2020   Procedure: BILIARY STENT PLACEMENT;  Surgeon: Rogene Houston, MD;  [facility];  Service: Endoscopy;  Laterality: N/A;   ELBOW SURGERY     Bone graft to repair elbow   ERCP N/A 06/11/2020   Procedure: ENDOSCOPIC RETROGRADE CHOLANGIOPANCREATOGRAPHY (ERCP);  Surgeon: Rogene Houston, MD;  [facility];  Service: Endoscopy;  Laterality: N/A;   LOWER EXTREMITY ANGIOGRAPHY N/A 07/24/2018   Procedure: LOWER EXTREMITY ANGIOGRAPHY;  Surgeon: Marty Heck, MD;  Location: [place] [facility];  Service: Cardiovascular;  Laterality: N/A;   PERIPHERAL VASCULAR INTERVENTION  07/24/2018   Procedure: PERIPHERAL VASCULAR INTERVENTION;  Surgeon: Marty Heck, MD;  Location: [place] [facility];  Service:  Cardiovascular;;   PORTACATH PLACEMENT Left 05/21/2020   Procedure: INSERTION PORT-A-CATH;  Surgeon: Aviva Signs, MD;  Location: [facility];  Service: General;  Laterality: Left;   SPHINCTEROTOMY  06/11/2020   Procedure: SHORT SPHINCTEROTOMY;  Surgeon: Rogene Houston, MD;  [facility];  Service: Endoscopy;;   TONSILLECTOMY     TRANSMETATARSAL AMPUTATION Left 07/25/2018   Procedure: LEFT GREAT TOE AMPUTATION;  Surgeon: Marty Heck, MD;  Location: [place];  Service: Vascular;  Laterality: Left;   TRANSMETATARSAL AMPUTATION Left 07/30/2018   Procedure: TRANSMETATARSAL AMPUTATION;  Surgeon: Waynetta Sandy, MD;  Location: [facility];  Service: Vascular;  Laterality: Left;    Social History   Socioeconomic History   Marital status: Married    Spouse name: Kitty   Number of children: 4   Years of education: Not on file   Highest education level: Not on file  Occupational History   Not on file  Tobacco Use   Smoking status: Current Every Day Smoker    Packs/day: 0.50    Types: Cigarettes   Smokeless tobacco: Former User    Types: Snuff    Quit date: 04/21/1989  Vaping Use  Vaping Use: Never used  Substance and Sexual Activity   Alcohol use: Not Currently    Alcohol/week: 0.0 standard drinks    Comment: stopped 2 years ago   Drug use: No   Sexual activity: Not Currently  Other Topics Concern   Not on file  Social History Narrative   Lives with wife [name]- married 13 years    4 children with wife. All grown- live close by       15 Cats and 5 Dogs      Enjoys: walking when hip lets him, camping, likes being outside       Diet: eats all food groups    Caffeine: diet sodas 3    Water: 6-8 cups daily -gallon       Wears seat belt    Does not drive- daughter drove him 21 years ago lost license   No smoke detectors due to wood burning stove    Weapons- Sales executive          Social Determinants of Health   Financial Resource Strain: Medium  Risk   Difficulty of Paying Living Expenses: Somewhat hard  Food Insecurity: No Food Insecurity   Worried About Charity fundraiser in the Last Year: Never true   Arboriculturist in the Last Year: Never true  Transportation Needs: No Transportation Needs   Lack of Transportation (Medical): No   Lack of Transportation (Non-Medical): No  Physical Activity: Inactive   Days of Exercise per Week: 0 days   Minutes of Exercise per Session: 0 min  Stress: No Stress Concern Present   Feeling of Stress : Not at all  Social Connections: Moderately Integrated   Frequency of Communication with Friends and Family: Twice a week   Frequency of Social Gatherings with Friends and Family: Twice a week   Attends Religious Services: 1 to 4 times per year   Active Member of [employer] or Organizations: No   Attends Archivist Meetings: Never   Marital Status: Married    Family History  Problem Relation Age of Onset   Alcoholism Mother    Alcoholism Father    Cervical cancer Sister        dx. late 74s   Cervical cancer Sister        dx. late 73s    Outpatient Encounter Medications as of 08/02/2020  Medication Sig   Accu-Chek Softclix Lancets lancets 1 each by Other route in the morning, at noon, in the evening, and at bedtime. Dx: E11.65   acetaminophen (TYLENOL) 500 MG tablet Take 500-1,000 mg by mouth every 6 (six) hours as needed (for pain).    bisacodyl (DULCOLAX) 5 MG EC tablet Take 5 mg by mouth daily as needed for moderate constipation.    blood glucose meter kit and supplies KIT Dispense based on patient and insurance preference. Use up to four times daily as directed. (FOR ICD-9 250.00, 250.01).   Blood Glucose Monitoring Suppl (ACCU-CHEK GUIDE ME) w/Device KIT 1 Piece by Does not apply route as directed.   dexamethasone (DECADRON) 10 MG/ML injection Inject 10 mg into the vein every 14 (fourteen) days. Dexamethasone 10mg  in NS 50 ml IVPB prior to chemotherapy  administration    fluorouracil CALGB 21308 in sodium chloride 0.9 % 150 mL Inject 2,400 mg/m2 into the vein over 48 hr.    fosaprepitant (EMEND) 150 MG SOLR injection Inject 150 mg into the vein every 14 (fourteen) days. Prior to chemotherapy administration  glucose blood (ACCU-CHEK GUIDE) test strip Test BG 4 x daily. DX E11.65   insulin degludec (TRESIBA FLEXTOUCH) 100 UNIT/ML FlexTouch Pen Inject 25 Units into the skin at bedtime.   Insulin Pen Needle 32G X 4 MM MISC Use to inject insulin   IRINOTECAN HCL IV Inject 280 mg/m2 into the vein every 14 (fourteen) days.    LEUCOVORIN CALCIUM IV Inject 400 mg/m2 into the vein every 14 (fourteen) days.    lidocaine-prilocaine (EMLA) cream Apply a small amount to port a cath site and cover with plastic wrap 1 hour prior to chemotherapy appointments   lipase/protease/amylase (CREON) 36000 UNITS CPEP capsule Take 1 capsule (36,000 Units total) by mouth 3 (three) times daily with meals AND 1 capsule (36,000 Units total) with snacks.   metFORMIN (GLUCOPHAGE) 500 MG tablet Take 1,000 mg by mouth 2 (two) times daily.   OXALIPLATIN IV Inject 85 mg/m2 into the vein every 14 (fourteen) days.    oxyCODONE (ROXICODONE) 5 MG immediate release tablet Take 2 tablets (10 mg total) by mouth every 6 (six) hours as needed for moderate pain or severe pain.   palonosetron (ALOXI) 0.25 MG/5ML injection Inject 0.25 mg into the vein every 14 (fourteen) days. Prior to chemotherapy administration    potassium chloride SA (KLOR-CON) 20 MEQ tablet Take 1 tablet (20 mEq total) by mouth 2 (two) times daily.   prochlorperazine (COMPAZINE) 10 MG tablet Take 1 tablet (10 mg total) by mouth every 6 (six) hours as needed for nausea or vomiting.   tamsulosin (FLOMAX) 0.4 MG CAPS capsule Take 1 capsule (0.4 mg total) by mouth at bedtime.   [DISCONTINUED] insulin degludec (TRESIBA FLEXTOUCH) 100 UNIT/ML FlexTouch Pen Inject 10 Units into the skin daily.   [DISCONTINUED]  Insulin Pen Needle 32G X 4 MM MISC Use to inject insulin   No facility-administered encounter medications on file as of 08/02/2020.    ALLERGIES: Allergies  Allergen Reactions   Tea Itching and Rash    VACCINATION STATUS: Immunization History  Administered Date(s) Administered   Influenza, High Dose Seasonal PF 07/26/2018   Moderna SARS-COVID-2 Vaccination 12/29/2019, 01/26/2020   Pneumococcal Polysaccharide-23 07/26/2018    Diabetes He presents for his initial diabetic visit. 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 Onset time: Patient was diagnosed at approximate age of 55 years. His disease course has been improving. There are no hypoglycemic associated symptoms. Pertinent negatives for hypoglycemia include no confusion, headaches, pallor or seizures. Associated symptoms include foot ulcerations and polyuria. Pertinent negatives for diabetes include no blurred vision, no chest pain, no fatigue, no polydipsia, no polyphagia and no weakness. There are no hypoglycemic complications. Symptoms are improving. Diabetic complications include PVD. (He is status post left below-knee amputation.) Risk factors for coronary artery disease include diabetes mellitus, hypertension, male sex, tobacco exposure, sedentary lifestyle and dyslipidemia. Current diabetic treatment includes insulin injections. He is compliant with treatment most of the time. His weight is stable. He is following a generally unhealthy diet. When asked about meal planning, he reported none. He has not had a previous visit with a dietitian. He never participates in exercise. His home blood glucose trend is decreasing steadily. His breakfast blood glucose range is generally 180-200 mg/dl. His bedtime blood glucose range is generally >200 mg/dl. (He presents today with no meter or logs to review.  He reports his morning glucose averages between  150-200s and above 200s before bedtime.  His POCT A1c today  is 9.9%, improving from previous visit of 10.8%.  He denies any episodes of hypoglycemia. ) An ACE inhibitor/angiotensin II receptor blocker is not being taken. He does not see a podiatrist.Eye exam is not current.     Review of Systems  Constitutional: Negative for chills, fatigue, fever and unexpected weight change.  HENT: Positive for dental problem and trouble swallowing. Negative for mouth sores.        He has no teeth and does not have dentures.  Has trouble swallowing large pills at times  Eyes: Negative for blurred vision and visual disturbance.  Respiratory: Negative for cough, choking, chest tightness, shortness of breath and wheezing.   Cardiovascular: Negative for chest pain, palpitations and leg swelling.  Gastrointestinal: Positive for constipation. Negative for abdominal distention, abdominal pain, diarrhea, nausea and vomiting.       He reports considerable difficulty swallowing both solids and liquids.  Endocrine: Positive for polyuria. Negative for polydipsia and polyphagia.  Genitourinary: Negative for dysuria, flank pain, hematuria and urgency.  Musculoskeletal: Positive for gait problem. Negative for back pain, myalgias and neck pain.       He is status post left below-knee amputation.  Skin: Negative for pallor, rash and wound.  Neurological: Negative for seizures, syncope, weakness, numbness and headaches.  Psychiatric/Behavioral: Negative for confusion and dysphoric mood.    Objective:    BP 101/69 (BP Location: Left Arm, Patient Position: Sitting)    Pulse 90    Ht $R'5\' 11"'lh$  (1.803 m)    Wt 132 lb (59.9 kg)    BMI 18.41 kg/m   Wt Readings from Last 3 Encounters:  08/02/20 132 lb (59.9 kg)  07/29/20 129 lb 10.1 oz (58.8 kg)  07/28/20 131 lb (59.4 kg)    BP Readings from Last 3 Encounters:  08/02/20 101/69  07/29/20 119/67  07/23/20 103/76    Physical Exam Constitutional:      General: He is not in  acute distress.    Appearance: He is well-developed.     Comments: General poor hygiene with odor  HENT:     Head: Normocephalic and atraumatic.  Neck:     Thyroid: No thyromegaly.     Trachea: No tracheal deviation.  Cardiovascular:     Pulses:          Dorsalis pedis pulses are 1+ on the right side and 1+ on the left side.       Posterior tibial pulses are 1+ on the right side and 1+ on the left side.     Heart sounds: S1 normal and S2 normal. No murmur heard.  No gallop.   Pulmonary:     Effort: Pulmonary effort is normal. No respiratory distress.     Breath sounds: No wheezing.  Abdominal:     General: There is no distension.     Tenderness: There is no abdominal tenderness. There is no guarding.  Musculoskeletal:     Right shoulder: No swelling or deformity.     Cervical back: Normal range of motion and neck supple.     Comments: Left below-knee amputation.  Skin:    General: Skin is warm and dry.     Findings: No rash.     Nails: There is no clubbing.  Neurological:     Mental Status: He is alert and oriented to person, place, and time.     Cranial Nerves: No cranial nerve deficit.     Sensory: No sensory deficit.     Gait: Gait normal.  Deep Tendon Reflexes: Reflexes are normal and symmetric.  Psychiatric:        Speech: Speech normal.        Behavior: Behavior normal. Behavior is cooperative.        Thought Content: Thought content normal.        Judgment: Judgment normal.     Comments: Patient has reluctant affect.      CMP ( most recent) CMP     Component Value Date/Time   NA 138 07/29/2020 1008   K 3.1 (L) 07/29/2020 1008   CL 101 07/29/2020 1008   CO2 26 07/29/2020 1008   GLUCOSE 98 07/29/2020 1008   BUN 11 07/29/2020 1008   CREATININE 0.54 (L) 07/29/2020 1008   CALCIUM 8.9 07/29/2020 1008   PROT 6.9 07/29/2020 1008   ALBUMIN 2.8 (L) 07/29/2020 1008   AST 51 (H) 07/29/2020 1008   ALT 43 07/29/2020 1008   ALKPHOS 333 (H) 07/29/2020 1008    BILITOT 1.4 (H) 07/29/2020 1008   GFRNONAA >60 07/29/2020 1008   GFRAA >60 06/22/2020 0758     Diabetic Labs (most recent): Lab Results  Component Value Date   HGBA1C 9.9 (A) 08/02/2020   HGBA1C 10.8 (H) 04/24/2020   HGBA1C 7.4 (H) 07/21/2018     Lipid Panel     Component Value Date/Time   CHOL 103 07/24/2018 0441   TRIG 53 07/24/2018 0441   HDL 40 (L) 07/24/2018 0441   CHOLHDL 2.6 07/24/2018 0441   VLDL 11 07/24/2018 0441   LDLCALC 52 07/24/2018 0441    Assessment & Plan:   1. 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 has currently uncontrolled symptomatic pancreatic diabetes related to his pancreatic cancer.  He presents today with no meter or logs to review.  He reports his morning glucose averages between 150-200s and above 200s before bedtime.  His POCT A1c today is 9.9%, improving from previous visit of 10.8%.  He denies any episodes of hypoglycemia.   - I had a long discussion with him about the  the pathology behind his diabetes and its complications. -his diabetes is complicated by his advanced cancer with difficulty eating, weight deficit and he remains at a high risk for more acute and chronic complications which include CAD, CVA, CKD, retinopathy, and neuropathy. These are all discussed in detail with him.  -This patient is in desperate need of proper nutrition, advised to increase his intake of carbs, proteins, and appropriate fats.    - I have approached him with the following individualized plan to manage  his diabetes and patient agrees:   -He is not a candidate for non-insulin management options.  -Given his favorable response to basal insulin and above target glycemic profile, he will tolerate slight increase in his [place] to 25 units SQ daily at bedtime.  I cannot advance further without reviewing his meter or logs for safety purposes.  -He is encouraged to continue monitoring glucose at least twice  daily, before breakfast and before bed, and call the clinic if he has readings less than 70 or greater than 300 for 3 tests in a row.  -He is not a suitable candidate for SGLT2 inhibitors nor incretin therapy. - Specific targets for  A1c;  LDL, HDL,  and Triglycerides were discussed with the patient.  2) Blood Pressure /Hypertension: His blood pressure is controlled to target without the use of antihypertensive medications.  3) Lipids/Hyperlipidemia:   His most recent lipid panel from  07/24/18 shows controlled LDL of 52.  He is not on any lipid lowering agents at this time.  Will recommend repeating lipid panel prior to subsequent visits.  4) Exocrine pancreatic insufficiency : His Body mass index is 18.41 kg/m.  -He has significant weight deficit.  He is benefiting from Creon intervention (although he does not see a difference).  He is advised to continue Creon 36,000 units 3 -5 times a day with meals and snacks. -He describes eating/swallowing difficulty, may need other feeding mechanisms.  5) Chronic Care/Health Maintenance: -he  is encouraged to initiate and continue to follow up with Ophthalmology, Dentist,  Podiatrist at least yearly or according to recommendations, and advised to quit smoking. I have recommended yearly flu vaccine and pneumonia vaccine at least every 5 years; moderate intensity exercise for up to 150 minutes weekly; and  sleep for at least 7 hours a day.  He is a chronic heavy smoker. Smoking cessation instruction/counseling given:  counseled patient on the dangers of tobacco use, advised patient to stop smoking, and reviewed strategies to maximize success  - he is advised to maintain close follow up with Perlie Mayo, NP for primary care needs, as well as his other providers for optimal and coordinated care.   - Time spent on this patient care encounter:  35 min, of which > 50% was spent in  counseling and the rest reviewing his blood glucose logs , discussing his  hypoglycemia and hyperglycemia episodes, reviewing his current and  previous labs / studies  ( including abstraction from other facilities) and medications  doses and developing a  long term treatment plan and documenting his care.   Please refer to Patient Instructions for Blood Glucose Monitoring and Insulin/Medications Dosing Guide"  in media tab for additional information. Please  also refer to " Patient Self Inventory" in the Media  tab for reviewed elements of pertinent patient history.  John Islam Sr. participated in the discussions, expressed understanding, and voiced agreement with the above plans.  All questions were answered to his satisfaction. he is encouraged to contact clinic should he have any questions or concerns prior to his return visit.   Follow up plan: - Return in about 3 months (around 11/02/2020) for Diabetes follow up with A1c in office, Bring glucometer and logs, No previsit labs.  Rayetta Pigg, [facility] [address] [place], [place] [address] Phone: [phone] Fax: [phone]    08/02/2020, 11:37 AM

## 2020-08-04 ENCOUNTER — Encounter (HOSPITAL_COMMUNITY): Payer: Self-pay | Admitting: Hematology

## 2020-08-04 ENCOUNTER — Inpatient Hospital Stay (HOSPITAL_COMMUNITY): Payer: Medicare Other

## 2020-08-04 ENCOUNTER — Inpatient Hospital Stay (HOSPITAL_BASED_OUTPATIENT_CLINIC_OR_DEPARTMENT_OTHER): Payer: Medicare Other | Admitting: Hematology

## 2020-08-04 ENCOUNTER — Other Ambulatory Visit: Payer: Self-pay

## 2020-08-04 VITALS — BP 125/80 | HR 82 | Temp 97.1°F | Resp 17 | Wt 136.2 lb

## 2020-08-04 VITALS — BP 137/78 | HR 76 | Temp 98.4°F | Resp 18

## 2020-08-04 DIAGNOSIS — Z5111 Encounter for antineoplastic chemotherapy: Secondary | ICD-10-CM | POA: Diagnosis not present

## 2020-08-04 DIAGNOSIS — C259 Malignant neoplasm of pancreas, unspecified: Secondary | ICD-10-CM

## 2020-08-04 DIAGNOSIS — Z95828 Presence of other vascular implants and grafts: Secondary | ICD-10-CM

## 2020-08-04 DIAGNOSIS — C787 Secondary malignant neoplasm of liver and intrahepatic bile duct: Secondary | ICD-10-CM | POA: Diagnosis not present

## 2020-08-04 DIAGNOSIS — E162 Hypoglycemia, unspecified: Secondary | ICD-10-CM

## 2020-08-04 LAB — COMPREHENSIVE METABOLIC PANEL
ALT: 55 U/L — ABNORMAL HIGH (ref 0–44)
AST: 84 U/L — ABNORMAL HIGH (ref 15–41)
Albumin: 2.7 g/dL — ABNORMAL LOW (ref 3.5–5.0)
Alkaline Phosphatase: 301 U/L — ABNORMAL HIGH (ref 38–126)
Anion gap: 8 (ref 5–15)
BUN: 11 mg/dL (ref 8–23)
CO2: 25 mmol/L (ref 22–32)
Calcium: 8.6 mg/dL — ABNORMAL LOW (ref 8.9–10.3)
Chloride: 102 mmol/L (ref 98–111)
Creatinine, Ser: 0.42 mg/dL — ABNORMAL LOW (ref 0.61–1.24)
GFR, Estimated: >60 mL/min (ref >60)
Glucose, Bld: 49 mg/dL — ABNORMAL LOW (ref 70–99)
Potassium: 3.7 mmol/L (ref 3.5–5.1)
Sodium: 135 mmol/L (ref 135–145)
Total Bilirubin: 1.1 mg/dL (ref 0.3–1.2)
Total Protein: 6.5 g/dL (ref 6.5–8.1)

## 2020-08-04 LAB — CBC WITH DIFFERENTIAL/PLATELET
Abs Immature Granulocytes: 0.05 10*3/uL (ref 0.00–0.07)
Basophils Absolute: 0.1 10*3/uL (ref 0.0–0.1)
Basophils Relative: 1 %
Eosinophils Absolute: 0.2 10*3/uL (ref 0.0–0.5)
Eosinophils Relative: 2 %
HCT: 33.3 % — ABNORMAL LOW (ref 39.0–52.0)
Hemoglobin: 10.7 g/dL — ABNORMAL LOW (ref 13.0–17.0)
Immature Granulocytes: 0 %
Lymphocytes Relative: 12 %
Lymphs Abs: 1.6 10*3/uL (ref 0.7–4.0)
MCH: 29.6 pg (ref 26.0–34.0)
MCHC: 32.1 g/dL (ref 30.0–36.0)
MCV: 92 fL (ref 80.0–100.0)
Monocytes Absolute: 0.9 10*3/uL (ref 0.1–1.0)
Monocytes Relative: 8 %
Neutro Abs: 9.8 10*3/uL — ABNORMAL HIGH (ref 1.7–7.7)
Neutrophils Relative %: 77 %
Platelets: 159 10*3/uL (ref 150–400)
RBC: 3.62 MIL/uL — ABNORMAL LOW (ref 4.22–5.81)
RDW: 17 % — ABNORMAL HIGH (ref 11.5–15.5)
WBC: 12.6 10*3/uL — ABNORMAL HIGH (ref 4.0–10.5)
nRBC: 0 % (ref 0.0–0.2)

## 2020-08-04 LAB — GLUCOSE, RANDOM: Glucose, Bld: 123 mg/dL — ABNORMAL HIGH (ref 70–99)

## 2020-08-04 LAB — MAGNESIUM: Magnesium: 1.8 mg/dL (ref 1.7–2.4)

## 2020-08-04 MED ORDER — SODIUM CHLORIDE 0.9 % IV SOLN
150.0000 mg | Freq: Once | INTRAVENOUS | Status: AC
  Administered 2020-08-04: 150 mg via INTRAVENOUS
  Filled 2020-08-04: qty 150

## 2020-08-04 MED ORDER — SODIUM CHLORIDE 0.9 % IV SOLN: INTRAVENOUS | Status: DC

## 2020-08-04 MED ORDER — SODIUM CHLORIDE 0.9 % IV SOLN
150.0000 mg/m2 | Freq: Once | INTRAVENOUS | Status: AC
  Administered 2020-08-04: 280 mg via INTRAVENOUS
  Filled 2020-08-04: qty 14

## 2020-08-04 MED ORDER — DEXTROSE 5 % IV SOLN: Freq: Once | INTRAVENOUS | Status: DC

## 2020-08-04 MED ORDER — SODIUM CHLORIDE 0.9 % IV SOLN: Freq: Once | INTRAVENOUS | Status: AC

## 2020-08-04 MED ORDER — SODIUM CHLORIDE 0.9 % IV SOLN
10.0000 mg | Freq: Once | INTRAVENOUS | Status: AC
  Administered 2020-08-04: 10 mg via INTRAVENOUS
  Filled 2020-08-04: qty 10

## 2020-08-04 MED ORDER — SODIUM CHLORIDE 0.9% FLUSH
10.0000 mL | INTRAVENOUS | Status: DC | PRN
  Administered 2020-08-04: 10 mL

## 2020-08-04 MED ORDER — SODIUM CHLORIDE 0.9 % IV SOLN
382.0000 mg/m2 | Freq: Once | INTRAVENOUS | Status: AC
  Administered 2020-08-04: 700 mg via INTRAVENOUS
  Filled 2020-08-04: qty 35

## 2020-08-04 MED ORDER — PALONOSETRON HCL INJECTION 0.25 MG/5ML
INTRAVENOUS | Status: AC
  Filled 2020-08-04: qty 5

## 2020-08-04 MED ORDER — OXALIPLATIN CHEMO INJECTION 100 MG/20ML
68.0000 mg/m2 | Freq: Once | INTRAVENOUS | Status: AC
  Administered 2020-08-04: 125 mg via INTRAVENOUS
  Filled 2020-08-04: qty 20

## 2020-08-04 MED ORDER — PALONOSETRON HCL INJECTION 0.25 MG/5ML
0.2500 mg | Freq: Once | INTRAVENOUS | Status: AC
  Administered 2020-08-04: 0.25 mg via INTRAVENOUS

## 2020-08-04 MED ORDER — ATROPINE SULFATE 1 MG/ML IJ SOLN: 0.5000 mg | Freq: Once | INTRAMUSCULAR | Status: DC | PRN

## 2020-08-04 MED ORDER — SODIUM CHLORIDE 0.9 % IV SOLN
2400.0000 mg/m2 | INTRAVENOUS | Status: DC
  Administered 2020-08-04: 4400 mg via INTRAVENOUS
  Filled 2020-08-04: qty 88

## 2020-08-04 MED ORDER — DEXTROSE 5 % IV SOLN: Freq: Once | INTRAVENOUS | Status: AC

## 2020-08-04 NOTE — Progress Notes (Signed)
0909 Labs reviewed with and pt seen by Dr. 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 Dr. Delton Coombes also ordered repeat glucose level in 1 hour due to glucose of 49 this morning.Pt given cereal bar and soda to increase glucose per DWilson RN                                      1023 Repeat glucose 123 and Dr. Delton Coombes made aware.                                                                                      John Islam Sr. tolerated FOLFIRINOX infusions well without complaints or incident. Pt discharged with 5FU pump infusing without issues. VSS upon discharge. Pt discharged via wheelchair in satisfactory condition

## 2020-08-04 NOTE — Progress Notes (Signed)
Patient was assessed by Dr. Delton Coombes and labs have been reviewed.  Bilirubin is normal, Dr. Delton Coombes is increasing irinotecan to full dose.  Due to neuropathy, he is dose reducing oxaliplatin by 20%.  Patient's glucose is 49, he was given snack and soda, primary RN to recheck glucose in 1 hour.  Patient is okay to proceed with treatment today. Primary RN and pharmacy aware.

## 2020-08-04 NOTE — Patient Instructions (Addendum)
[place] at [facility] Discharge Instructions  You were seen today by Dr. Delton Coombes. He went over your recent results. You received your treatment today. Make sure to eat and drink everything at room temperature after receiving your chemo. Continue eating meals to maintain your weight. Dr. Delton Coombes will see you back in 3 weeks for labs and follow up.   Thank you for choosing [place] at [facility] to provide your oncology and hematology care.  To afford each patient quality time with our provider, please arrive at least 15 minutes before your scheduled appointment time.   If you have a lab appointment with the [facility] please come in thru the Main Entrance and check in at the main information desk  You need to re-schedule your appointment should you arrive 10 or more minutes late.  We strive to give you quality time with our providers, and arriving late affects you and other patients whose appointments are after yours.  Also, if you no show three or more times for appointments you may be dismissed from the clinic at the providers discretion.     Again, thank you for choosing [facility].  Our hope is that these requests will decrease the amount of time that you wait before being seen by our physicians.       _____________________________________________________________  Should you have questions after your visit to [facility], please contact our office at [phone] between the hours of 8:00 a.m. and 4:30 p.m.  Voicemails left after 4:00 p.m. will not be returned until the following business day.  For prescription refill requests, have your pharmacy contact our office and allow 72 hours.    Cancer Center Support Programs:   > Cancer Support Group  2nd Tuesday of the month 1pm-2pm, Journey Room

## 2020-08-04 NOTE — Progress Notes (Signed)
[name], [place] [address]   CLINIC:  Medical Oncology/Hematology  PCP:  Perlie Mayo, NP [address] 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 The report date is 06/18/2020.   The Common Hereditary Cancers Panel offered by [facility] includes sequencing and/or deletion duplication testing of the following 48 genes: APC, ATM, AXIN2, BARD1, BMPR1A, BRCA1, BRCA2, BRIP1, CDH1, CDK4, CDKN2A (p14ARF), CDKN2A (p16INK4a), CHEK2, CTNNA1, DICER1, EPCAM (Deletion/duplication testing only), GREM1 (promoter region deletion/duplication testing only), KIT, MEN1, MLH1, MSH2, MSH3, MSH6, MUTYH, NBN, NF1, NTHL1, PALB2, PDGFRA, PMS2, POLD1, POLE, PTEN, RAD50, RAD51C, RAD51D, RNF43, SDHB, SDHC, SDHD, SMAD4, SMARCA4. STK11, TP53, TSC1, TSC2, and VHL.  The following genes were evaluated for sequence changes only: SDHA and HOXB13 c.251G>A variant only. The Pancreatic Cancer Panel offered by [facility] includes sequencing and deletion/duplication analysis of the following 29 genes: APC, ATM, BMPR1A, BRCA1, BRCA2, CASR, CDK4, CDKN2A, CFTR, CPA1, CTRC, EPCAM, FANCC, MEN1, MLH1, MSH2, MSH6, NF1, PALB2, PALLD, PMS2, PRSS1, SMAD4, SPINK1, STK11, TP53, TSC1, TSC2, and VHL.   06/23/2020 -  Chemotherapy   The patient had palonosetron (ALOXI) injection 0.25 mg, 0.25 mg, Intravenous,  Once, 3 of 6  cycles Administration: 0.25 mg (06/23/2020), 0.25 mg (07/07/2020), 0.25 mg (07/21/2020) irinotecan (CAMPTOSAR) 160 mg in sodium chloride 0.9 % 500 mL chemo infusion, 90 mg/m2 = 160 mg (60 % of original dose 150 mg/m2), Intravenous,  Once, 2 of 5 cycles Dose modification: 90 mg/m2 (60 % of original dose 150 mg/m2, Cycle 2, Reason: Change in LFTs), 100 mg/m2 (original dose 150 mg/m2, Cycle 3, Reason: Provider Judgment) Administration: 160 mg (07/07/2020), 180 mg (07/21/2020) oxaliplatin (ELOXATIN) 115 mg in dextrose 5 % 500 mL chemo infusion, 63.75 mg/m2 = 115 mg (75 % of original dose 85 mg/m2), Intravenous,  Once, 3 of 6 cycles Dose modification: 63.75 mg/m2 (75 % of original dose 85 mg/m2, Cycle 1, Reason: Provider Judgment), 68 mg/m2 (80 % of original dose 85 mg/m2, Cycle 2, Reason: Provider Judgment) Administration: 115 mg (06/23/2020), 125 mg (07/07/2020), 150 mg (07/21/2020) fosaprepitant (EMEND) 150 mg in sodium chloride 0.9 % 145 mL IVPB, 150 mg, Intravenous,  Once, 3 of 6 cycles Administration: 150 mg (06/23/2020), 150 mg (07/07/2020), 150 mg (07/21/2020) fluorouracil (ADRUCIL) 2,500 mg in sodium chloride 0.9 % 100 mL chemo infusion, 1,370 mg/m2 = 2,650 mg (60 % of original dose 2,400 mg/m2), Intravenous, 1 Day/Dose, 3 of 6 cycles Dose modification: 1,800 mg/m2 (75 % of original dose 2,400 mg/m2, Cycle 1, Reason: Provider Judgment), 1,440 mg/m2 (60 % of original dose 2,400 mg/m2, Cycle 1, Reason: Change in LFTs) Administration: 2,500 mg (06/23/2020), 4,400 mg (07/07/2020), 4,400 mg (07/21/2020) leucovorin 440 mg in dextrose 5 % 250 mL infusion, 240 mg/m2 = 440 mg (60 % of original dose 400 mg/m2), Intravenous,  Once, 3 of 6 cycles Dose modification: 300 mg/m2 (75 % of original dose 400 mg/m2, Cycle 1, Reason: Provider Judgment),  240 mg/m2 (60 % of original dose 400 mg/m2, Cycle 1, Reason: Change in LFTs) Administration: 440 mg (06/23/2020), 732 mg (07/07/2020), 732 mg (07/21/2020)  for  chemotherapy treatment.      CANCER STAGING: Cancer Staging No matching staging information was found for the patient.  INTERVAL HISTORY:  Mr. Wilberth Damon Sr., a 69 y.o. male, returns for routine follow-up and consideration for next cycle of chemotherapy. Cono was last seen on 07/21/2020.  Due for cycle #4 of FOLFIRINOX today.   Today he is accompanied by his daughter. Overall, he tells me he has been feeling pretty well. He reports having abdominal pain and he is taking oxycodone which helps. He is taking [place] 25 units at bedtime. His wife keeps track of his BG log. He had constipated after the previous treatment, along with intermittent numbness and tingling in his hands and feet along with cold sensitivity, but denies abdominal cramping or diarrhea.  Overall, he feels ready for next cycle of chemo today.    REVIEW OF SYSTEMS:  Review of Systems  Constitutional: Positive for appetite change (70%) and fatigue (60%).  Respiratory: Positive for cough.   Gastrointestinal: Positive for abdominal pain (epigastric pain). Negative for diarrhea.  Neurological: Positive for dizziness (when moving) and numbness (numbness, tingling and cold sensitivity in hands & feet).  All other systems reviewed and are negative.   PAST MEDICAL/SURGICAL HISTORY:  Past Medical History:  Diagnosis Date  . Acute blood loss anemia   . Arthritis   . Benign essential HTN   . Cellulitis 07/20/2018  . Cellulitis in diabetic foot ([facility]) 07/20/2018  . Diabetes mellitus without complication ([facility])   . Diabetes mellitus, type II ([facility]) 07/20/2018  . Dry gangrene ([facility]) 07/20/2018  . Emesis   . Family history of cervical cancer   . Heart murmur   . Hyperglycemia   . Hypertension   . Joint pain   . Port-A-Cath in place 05/25/2020  . Unilateral complete BKA, left, initial encounter [facility])    Past Surgical History:  Procedure Laterality Date  . AMPUTATION Left 08/02/2018   Procedure: LEFT AMPUTATION BELOW  KNEE;  Surgeon: Marty Heck, MD;  Location: [facility];  Service: Vascular;  Laterality: Left;  . BILIARY STENT PLACEMENT N/A 06/11/2020   Procedure: BILIARY STENT PLACEMENT;  Surgeon: Rogene Houston, MD;  [facility];  Service: Endoscopy;  Laterality: N/A;  . ELBOW SURGERY     Bone graft to repair elbow  . ERCP N/A 06/11/2020   Procedure: ENDOSCOPIC RETROGRADE CHOLANGIOPANCREATOGRAPHY (ERCP);  Surgeon: Rogene Houston, MD;  [facility];  Service: Endoscopy;  Laterality: N/A;  . LOWER EXTREMITY ANGIOGRAPHY N/A 07/24/2018   Procedure: LOWER EXTREMITY ANGIOGRAPHY;  Surgeon: Marty Heck, MD;  Location: [facility];  Service: Cardiovascular;  Laterality: N/A;  . PERIPHERAL VASCULAR INTERVENTION  07/24/2018   Procedure: PERIPHERAL VASCULAR INTERVENTION;  Surgeon: Marty Heck, MD;  Location: [facility];  Service: Cardiovascular;;  . PORTACATH PLACEMENT Left 05/21/2020   Procedure: INSERTION PORT-A-CATH;  Surgeon: Aviva Signs, MD;  Location[facility];  Service: General;  Laterality: Left;  . SPHINCTEROTOMY  06/11/2020   Procedure: SHORT SPHINCTEROTOMY;  Surgeon: Rogene Houston, MD;  [facility];  Service: Endoscopy;;  . TONSILLECTOMY    . TRANSMETATARSAL AMPUTATION Left 07/25/2018   Procedure: LEFT GREAT TOE AMPUTATION;  Surgeon: Marty Heck, MD;  Location: [facility];  Service: Vascular;  Laterality: Left;  . TRANSMETATARSAL AMPUTATION  Left 07/30/2018   Procedure: TRANSMETATARSAL AMPUTATION;  Surgeon: Waynetta Sandy, MD;  Location: [facility];  Service: Vascular;  Laterality: Left;    SOCIAL HISTORY:  Social History   Socioeconomic History  . Marital status: Married    Spouse name: [name]  . Number of children: 4  . Years of education: Not on file  . Highest education level: Not on file  Occupational History  . Not on file  Tobacco Use  . Smoking status: Current Every Day Smoker    Packs/day: 0.25     Types: Cigarettes  . Smokeless tobacco: Former Systems developer    Types: Snuff    Quit date: 04/21/1989  Vaping Use  . Vaping Use: Never used  Substance and Sexual Activity  . Alcohol use: Not Currently    Alcohol/week: 0.0 standard drinks    Comment: stopped 2 years ago  . Drug use: No  . Sexual activity: Not Currently  Other Topics Concern  . Not on file  Social History Narrative   Lives with wife [name]- married 24 years    4 children with wife. 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 Difficulty of Paying Living Expenses: Not hard at all  Food Insecurity: No Food Insecurity  . Worried About Charity fundraiser in the Last Year: Never true  . Ran Out of Food in the Last Year: Never true  Transportation Needs: No Transportation Needs  . Lack of Transportation (Medical): No  . Lack of Transportation (Non-Medical): No  Physical Activity: Inactive  . Days of Exercise per Week: 0 days  . Minutes of Exercise per Session: 0 min  Stress: No Stress Concern Present  . Feeling of Stress : Only a little  Social Connections: Moderately Isolated  . Frequency of Communication with Friends and Family: More than three times a week  . Frequency of Social Gatherings with Friends and Family: Twice a week  . Attends Religious Services: Never  . Active Member of Clubs or Organizations: No  . Attends Archivist Meetings: Never  . Marital Status: Married  Human resources officer Violence: Not At Risk  . Fear of Current or Ex-Partner: No  . Emotionally Abused: No  . Physically Abused: No  .  Sexually Abused: No    FAMILY HISTORY:  Family History  Problem Relation Age of Onset  . Alcoholism Mother   . Alcoholism Father   . Cervical cancer Sister        dx. late 68s  . Cervical cancer Sister        dx. late 45s    CURRENT MEDICATIONS:  Current Outpatient Medications  Medication Sig Dispense Refill  . Accu-Chek Softclix Lancets lancets 1 each by Other route in the morning, at noon, in the evening, and at bedtime. Dx: E11.65    . acetaminophen (TYLENOL) 500 MG tablet Take  500-1,000 mg by mouth every 6 (six) hours as needed (for pain).     . bisacodyl (DULCOLAX) 5 MG EC tablet Take 5 mg by mouth daily as needed for moderate constipation.     . blood glucose meter kit and supplies KIT Dispense based on patient and insurance preference. Use up to four times daily as directed. (FOR ICD-9 250.00, 250.01). 1 each 0  . Blood Glucose Monitoring Suppl (ACCU-CHEK GUIDE ME) w/Device KIT 1 Piece by Does not apply route as directed. 1 kit 0  . dexamethasone (DECADRON) 10 MG/ML injection Inject 10 mg into the vein every 14 (fourteen) days. Dexamethasone 10mg  in NS 50 ml IVPB prior to chemotherapy administration     . fluorouracil CALGB 76160 in sodium chloride 0.9 % 150 mL Inject 2,400 mg/m2 into the vein over 48 hr.     . fosaprepitant (EMEND) 150 MG SOLR injection Inject 150 mg into the vein every 14 (fourteen) days. Prior to chemotherapy administration     . glucose blood (ACCU-CHEK GUIDE) test strip Test BG 4 x daily. DX E11.65 150 each 5  . insulin degludec (TRESIBA FLEXTOUCH) 100 UNIT/ML FlexTouch Pen Inject 25 Units into the skin at bedtime. 30 mL 2  . Insulin Pen Needle 32G X 4 MM MISC Use to inject insulin 100 each 1  . IRINOTECAN HCL IV Inject 280 mg/m2 into the vein every 14 (fourteen) days.     Marland Kitchen LEUCOVORIN CALCIUM IV Inject 400 mg/m2 into the vein every 14 (fourteen) days.     Marland Kitchen lidocaine-prilocaine (EMLA) cream Apply a small amount to port a cath site and cover with plastic  wrap 1 hour prior to chemotherapy appointments 30 g 3  . lipase/protease/amylase (CREON) 36000 UNITS CPEP capsule Take 1 capsule (36,000 Units total) by mouth 3 (three) times daily with meals AND 1 capsule (36,000 Units total) with snacks. 240 capsule 2  . metFORMIN (GLUCOPHAGE) 500 MG tablet Take 1,000 mg by mouth 2 (two) times daily.    . OXALIPLATIN IV Inject 85 mg/m2 into the vein every 14 (fourteen) days.     Marland Kitchen oxyCODONE (ROXICODONE) 5 MG immediate release tablet Take 2 tablets (10 mg total) by mouth every 6 (six) hours as needed for moderate pain or severe pain. 120 tablet 0  . palonosetron (ALOXI) 0.25 MG/5ML injection Inject 0.25 mg into the vein every 14 (fourteen) days. Prior to chemotherapy administration     . potassium chloride SA (KLOR-CON) 20 MEQ tablet Take 1 tablet (20 mEq total) by mouth 2 (two) times daily. 8 tablet 0  . prochlorperazine (COMPAZINE) 10 MG tablet Take 1 tablet (10 mg total) by mouth every 6 (six) hours as needed for nausea or vomiting. 30 tablet 2  . tamsulosin (FLOMAX) 0.4 MG CAPS capsule Take 1 capsule (0.4 mg total) by mouth at bedtime. 30 capsule 2   No current facility-administered medications for this visit.    ALLERGIES:  Allergies  Allergen Reactions  . Tea Itching and Rash    PHYSICAL EXAM:  Performance status (ECOG): 1 - Symptomatic but completely ambulatory  Vitals:   08/04/20 0807  BP: 125/80  Pulse: 82  Resp: 17  Temp: (!) 97.1 F (36.2 C)  SpO2: 100%   Wt Readings from Last 3 Encounters:  08/04/20 136 lb 3.2 oz (61.8 kg)  08/02/20 132 lb (59.9 kg)  07/29/20 129 lb 10.1 oz (58.8 kg)   Physical Exam Vitals reviewed.  Constitutional:      Appearance: Normal appearance.  Cardiovascular:     Rate and Rhythm: Normal rate and regular rhythm.     Pulses: Normal pulses.     Heart sounds: Normal heart sounds.  Pulmonary:     Effort: Pulmonary effort is normal.     Breath sounds: Normal breath sounds.  Chest:     Comments:  Port-a-Cath in L chest Abdominal:     Palpations: Abdomen is soft. There is no mass.     Tenderness: There is abdominal tenderness in the epigastric area.  Musculoskeletal:     Right lower leg: No edema.     Left Lower Extremity: Left leg is amputated below knee.  Neurological:     General: No focal deficit present.     Mental Status: He is alert and oriented to person, place, and time.  Psychiatric:        Mood and Affect: Mood normal.        Behavior: Behavior normal.     LABORATORY DATA:  I have reviewed the labs as listed.  CBC Latest Ref Rng & Units 08/04/2020 07/29/2020 07/21/2020  WBC 4.0 - 10.5 K/uL 12.6(H) 10.1 15.1(H)  Hemoglobin 13.0 - 17.0 g/dL 10.7(L) 11.6(L) 10.8(L)  Hematocrit 39 - 52 % 33.3(L) 37.2(L) 33.7(L)  Platelets 150 - 400 K/uL 159 206 267   CMP Latest Ref Rng & Units 08/04/2020 07/29/2020 07/21/2020  Glucose 70 - 99 mg/dL 49(L) 98 290(H)  BUN 8 - 23 mg/dL 11 11 -  Creatinine 0.61 - 1.24 mg/dL 0.42(L) 0.54(L) -  Sodium 135 - 145 mmol/L 135 138 -  Potassium 3.5 - 5.1 mmol/L 3.7 3.1(L) -  Chloride 98 - 111 mmol/L 102 101 -  CO2 22 - 32 mmol/L 25 26 -  Calcium 8.9 - 10.3 mg/dL 8.6(L) 8.9 -  Total Protein 6.5 - 8.1 g/dL 6.5 6.9 -  Total Bilirubin 0.3 - 1.2 mg/dL 1.1 1.4(H) -  Alkaline Phos 38 - 126 U/L 301(H) 333(H) -  AST 15 - 41 U/L 84(H) 51(H) -  ALT 0 - 44 U/L 55(H) 43 -   Lab Results  Component Value Date   CAN199 85 (H) 07/29/2020   OXB353 12 04/25/2020    DIAGNOSTIC IMAGING:  I have independently reviewed the scans and discussed with the patient. US Venous Img Upper Uni Left (DVT)  Result Date: 07/30/2020 CLINICAL DATA:  Left shoulder pain following porta catheter placement on 05/21/2020. EXAM: LEFT UPPER EXTREMITY VENOUS DOPPLER ULTRASOUND TECHNIQUE: Gray-scale sonography with graded compression, as well as color Doppler and duplex ultrasound were performed to evaluate the upper extremity deep venous system from the level of the subclavian  vein and including the jugular, axillary, basilic, radial, ulnar and upper cephalic vein. Spectral Doppler was utilized to evaluate flow at rest and with distal augmentation maneuvers. COMPARISON:  None. FINDINGS: Contralateral Subclavian Vein: Respiratory phasicity is normal and symmetric with the symptomatic side. No evidence of thrombus. Normal compressibility. Internal Jugular Vein: No evidence of thrombus. Normal compressibility, respiratory phasicity and response to augmentation. Subclavian Vein: No evidence of thrombus. Normal compressibility, respiratory phasicity and response to augmentation. Axillary Vein: No evidence of thrombus. Normal compressibility, respiratory phasicity and response to augmentation. Cephalic Vein: Not well visualized due to its small size. No gross thrombus seen. Basilic Vein: No evidence of thrombus. Normal compressibility, respiratory phasicity and response to augmentation. Brachial Veins: No evidence of thrombus. Normal compressibility, respiratory phasicity and response to augmentation. Radial Veins: No evidence of thrombus. Normal compressibility, respiratory phasicity and response to augmentation. Ulnar  Veins: No evidence of thrombus. Normal compressibility, respiratory phasicity and response to augmentation. Venous Reflux:  None visualized. Other Findings:  None visualized. IMPRESSION: No evidence of DVT within the left upper extremity. Electronically Signed   By: Claudie Revering M.D.   On: 07/30/2020 12:49     ASSESSMENT:  1. 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 -Current active smoker, 1 pack/day for 47 years. No history of alcohol use. No family history of malignancy. -CT AP with contrast on 04/24/2020 shows 2.2 cm pancreatic head mass, small hypodense lesions throughout the bilateral lower lobes, predominantly in the right lower lobe, largest measuring 1.1 cm. 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 Mildly prominent lymph nodes within the upper abdomen above the pancreas head suspicious for metastatic lymphadenopathy. 5 mm pleural-based right lower lobe nodule. -CA 19-9 is 12. -Liver biopsy on 05/03/2020 consistent with metastatic adenocarcinoma, positive for CK7, CK20, CDX2. Differential diagnosis includes primary upper GI versus pancreaticobiliary. -PET scan on 05/19/2020 shows FDG avid had a pancreatic mass, multifocal low-attenuation lesions scattered throughout both lobes of the liver compatible with biopsy-proven liver metastasis. No FDG avid lymph nodes in the abdomen. No evidence of metastasis in the chest or bones. 2 small lung nodules within the right lower lobe measuring less than 5 mm. -ERCP and Wallstent placement on 06/11/2020.  2. Poorly controlled diabetes: -Hemoglobin A1c was 10.8 during hospitalization.  3. Abdominal pain: -Highly likely malignancy related. -He is currently on hydrocodone 5/325 1 to 2 tablets every 4 hours as needed. Patient reports that he still has pain but manageable.  4. Right kidney mass: -No further work-up needed if biopsy of liver lesion confirms pancreatic adenocarcinoma.   PLAN:  1.Metastatic pancreatic cancer to the liver: -He has tolerated last cycle very well.  However he developed some numbness in the fingertips and toes. -Review of labs showed elevated AST and ALT of 84 and 55 respectively.  Total bilirubin is normalized at 1.1. -He will proceed with his next cycle chemo today.  I will decrease oxaliplatin by 20% and increase Irinotecan to full dose. -We will also check CA 19-9 level.  RTC 2 weeks.  2. Poorly controlled diabetes: -Metformin and glipizide were discontinued. -Continue Tresiba 25 units at bedtime. -Today his sugar was 49.  After his snack, it improved to 123. -He was encouraged to bring a log of his fasting and bedtime  sugars.  3. Abdominal pain: -Pain is stable.  Continue oxycodone 10 mg every 6 hours as needed.  4. Right kidney mass: -Right kidney lesion suspicious for RCC.  No work-up needed.  5. Genetic testing: -Invitae testing is negative.  6. Hypokalemia: -Continue potassium 40 mEq daily.  7. Urinary frequency: -Continue Flomax 0.4 mg at bedtime.   Orders placed this encounter:  No orders of the defined types were placed in this encounter.    Derek Jack, MD [place] [phone]   I, Milinda Antis, am acting as a scribe for Dr. Sanda Linger.  I, Derek Jack MD, have reviewed the above documentation for accuracy and completeness, and I agree with the above.

## 2020-08-04 NOTE — Patient Instructions (Signed)
[facility] Discharge Instructions for Patients Receiving Chemotherapy   Beginning January 23rd 2017 lab work for the [facility] will be done in the  Main lab at [facility] on 1st floor. If you have a lab appointment with the [facility] please come in thru the  Main Entrance and check in at the main information desk   Today you received the following chemotherapy agents Oxaliplatin,Irinotecan,Leucovorin and 5FU. Follow-up as scheduled  To help prevent nausea and vomiting after your treatment, we encourage you to take your nausea medication   If you develop nausea and vomiting, or diarrhea that is not controlled by your medication, call the clinic.  The clinic phone number is [phone]. Office hours are Monday-Friday 8:30am-5:00pm.  BELOW ARE SYMPTOMS THAT SHOULD BE REPORTED IMMEDIATELY:  *FEVER GREATER THAN 101.0 F  *CHILLS WITH OR WITHOUT FEVER  NAUSEA AND VOMITING THAT IS NOT CONTROLLED WITH YOUR NAUSEA MEDICATION  *UNUSUAL SHORTNESS OF BREATH  *UNUSUAL BRUISING OR BLEEDING  TENDERNESS IN MOUTH AND THROAT WITH OR WITHOUT PRESENCE OF ULCERS  *URINARY PROBLEMS  *BOWEL PROBLEMS  UNUSUAL RASH Items with * indicate a potential emergency and should be followed up as soon as possible. If you have an emergency after office hours please contact your primary care physician or go to the nearest emergency department.  Please call the clinic during office hours if you have any questions or concerns.   You may also contact the Patient Navigator at [phone] should you have any questions or need assistance in obtaining follow up care.      Resources For Cancer Patients and their Caregivers ? American Cancer Society: Can assist with transportation, wigs, general needs, runs Look Good Feel Better.        907-704-3485 ? Cancer Care: Provides financial assistance, online support groups, medication/co-pay assistance.  1-800-813-HOPE 918-640-8360) ? [facility] [place] Assists [place] cancer patients and their families through emotional , educational and financial support.  [phone] ? [place] DSS Where to apply for food stamps, Medicaid and utility assistance. (770) 571-9425 ? [facility]: Transportation to medical appointments. (317)610-8485 ? Social Security Administration: May apply for disability if have a Stage IV cancer. 385-032-8675 640-465-6727 ? [employer], Disability and Transit Services: Assists with nutrition, care and transit needs. [phone]

## 2020-08-04 NOTE — Patient Instructions (Signed)
[facility] at [facility] Discharge Instructions  Labs drawn from portacath today   Thank you for choosing [facility] at [facility] to provide your oncology and hematology care.  To afford each patient quality time with our provider, please arrive at least 15 minutes before your scheduled appointment time.   If you have a lab appointment with the Cancer Center please come in thru the Main Entrance and check in at the main information desk.  You need to re-schedule your appointment should you arrive 10 or more minutes late.  We strive to give you quality time with our providers, and arriving late affects you and other patients whose appointments are after yours.  Also, if you no show three or more times for appointments you may be dismissed from the clinic at the providers discretion.     Again, thank you for choosing [facility].  Our hope is that these requests will decrease the amount of time that you wait before being seen by our physicians.       _____________________________________________________________  Should you have questions after your visit to [facility], please contact our office at [phone] and follow the prompts.  Our office hours are 8:00 a.m. and 4:30 p.m. Monday - Friday.  Please note that voicemails left after 4:00 p.m. may not be returned until the following business day.  We are closed weekends and major holidays.  You do have access to a nurse 24-7, just call the main number to the clinic [phone] and do not press any options, hold on the line and a nurse will answer the phone.    For prescription refill requests, have your pharmacy contact our office and allow 72 hours.    Due to Covid, you will need to wear a mask upon entering the hospital. If you do not have a mask, a mask will be given to you at the Main Entrance upon arrival. For doctor visits, patients may have 1 support person age 18  or older with them. For treatment visits, patients can not have anyone with them due to social distancing guidelines and our immunocompromised population.     

## 2020-08-06 ENCOUNTER — Inpatient Hospital Stay (HOSPITAL_COMMUNITY): Payer: Medicare Other

## 2020-08-06 NOTE — Progress Notes (Signed)
Patients port flushed without difficulty.  Good blood return noted with no bruising or swelling noted at site.  Band aid applied.  VSS with discharge and left in satisfactory condition with no s/s of distress noted.   

## 2020-08-24 ENCOUNTER — Inpatient Hospital Stay (HOSPITAL_BASED_OUTPATIENT_CLINIC_OR_DEPARTMENT_OTHER): Payer: Medicare Other | Admitting: Hematology

## 2020-08-24 ENCOUNTER — Other Ambulatory Visit: Payer: Self-pay

## 2020-08-24 ENCOUNTER — Inpatient Hospital Stay (HOSPITAL_COMMUNITY): Payer: Medicare Other

## 2020-08-24 VITALS — BP 132/75 | HR 89 | Temp 97.2°F | Resp 16 | Wt 130.2 lb

## 2020-08-24 VITALS — BP 147/73 | HR 71 | Temp 98.3°F | Resp 18

## 2020-08-24 DIAGNOSIS — Z95828 Presence of other vascular implants and grafts: Secondary | ICD-10-CM

## 2020-08-24 DIAGNOSIS — Z5111 Encounter for antineoplastic chemotherapy: Secondary | ICD-10-CM | POA: Diagnosis not present

## 2020-08-24 DIAGNOSIS — C259 Malignant neoplasm of pancreas, unspecified: Secondary | ICD-10-CM

## 2020-08-24 DIAGNOSIS — C787 Secondary malignant neoplasm of liver and intrahepatic bile duct: Secondary | ICD-10-CM

## 2020-08-24 LAB — CBC WITH DIFFERENTIAL/PLATELET
Abs Immature Granulocytes: 0.02 10*3/uL (ref 0.00–0.07)
Basophils Absolute: 0.1 10*3/uL (ref 0.0–0.1)
Basophils Relative: 1 %
Eosinophils Absolute: 0.3 10*3/uL (ref 0.0–0.5)
Eosinophils Relative: 5 %
HCT: 34.8 % — ABNORMAL LOW (ref 39.0–52.0)
Hemoglobin: 11.2 g/dL — ABNORMAL LOW (ref 13.0–17.0)
Immature Granulocytes: 0 %
Lymphocytes Relative: 26 %
Lymphs Abs: 1.5 10*3/uL (ref 0.7–4.0)
MCH: 30.2 pg (ref 26.0–34.0)
MCHC: 32.2 g/dL (ref 30.0–36.0)
MCV: 93.8 fL (ref 80.0–100.0)
Monocytes Absolute: 0.7 10*3/uL (ref 0.1–1.0)
Monocytes Relative: 12 %
Neutro Abs: 3.2 10*3/uL (ref 1.7–7.7)
Neutrophils Relative %: 56 %
Platelets: 141 10*3/uL — ABNORMAL LOW (ref 150–400)
RBC: 3.71 MIL/uL — ABNORMAL LOW (ref 4.22–5.81)
RDW: 18.7 % — ABNORMAL HIGH (ref 11.5–15.5)
WBC: 5.7 10*3/uL (ref 4.0–10.5)
nRBC: 0 % (ref 0.0–0.2)

## 2020-08-24 LAB — COMPREHENSIVE METABOLIC PANEL
ALT: 40 U/L (ref 0–44)
AST: 43 U/L — ABNORMAL HIGH (ref 15–41)
Albumin: 2.7 g/dL — ABNORMAL LOW (ref 3.5–5.0)
Alkaline Phosphatase: 348 U/L — ABNORMAL HIGH (ref 38–126)
Anion gap: 8 (ref 5–15)
BUN: 9 mg/dL (ref 8–23)
CO2: 27 mmol/L (ref 22–32)
Calcium: 8.8 mg/dL — ABNORMAL LOW (ref 8.9–10.3)
Chloride: 104 mmol/L (ref 98–111)
Creatinine, Ser: 0.55 mg/dL — ABNORMAL LOW (ref 0.61–1.24)
GFR, Estimated: >60 mL/min (ref >60)
Glucose, Bld: 254 mg/dL — ABNORMAL HIGH (ref 70–99)
Potassium: 3.1 mmol/L — ABNORMAL LOW (ref 3.5–5.1)
Sodium: 139 mmol/L (ref 135–145)
Total Bilirubin: 0.7 mg/dL (ref 0.3–1.2)
Total Protein: 6.5 g/dL (ref 6.5–8.1)

## 2020-08-24 LAB — MAGNESIUM: Magnesium: 1.9 mg/dL (ref 1.7–2.4)

## 2020-08-24 MED ORDER — SODIUM CHLORIDE 0.9 % IV SOLN
382.0000 mg/m2 | Freq: Once | INTRAVENOUS | Status: AC
  Administered 2020-08-24: 700 mg via INTRAVENOUS
  Filled 2020-08-24: qty 35

## 2020-08-24 MED ORDER — SODIUM CHLORIDE 0.9 % IV SOLN
2400.0000 mg/m2 | INTRAVENOUS | Status: DC
  Administered 2020-08-24: 4400 mg via INTRAVENOUS
  Filled 2020-08-24: qty 88

## 2020-08-24 MED ORDER — OXALIPLATIN CHEMO INJECTION 100 MG/20ML
68.0000 mg/m2 | Freq: Once | INTRAVENOUS | Status: AC
  Administered 2020-08-24: 125 mg via INTRAVENOUS
  Filled 2020-08-24: qty 20

## 2020-08-24 MED ORDER — ATROPINE SULFATE 1 MG/ML IJ SOLN
0.5000 mg | Freq: Once | INTRAMUSCULAR | Status: AC | PRN
  Administered 2020-08-24: 0.5 mg via INTRAVENOUS
  Filled 2020-08-24: qty 1

## 2020-08-24 MED ORDER — PALONOSETRON HCL INJECTION 0.25 MG/5ML
0.2500 mg | Freq: Once | INTRAVENOUS | Status: AC
  Administered 2020-08-24: 0.25 mg via INTRAVENOUS
  Filled 2020-08-24: qty 5

## 2020-08-24 MED ORDER — SODIUM CHLORIDE 0.9 % IV SOLN
150.0000 mg | Freq: Once | INTRAVENOUS | Status: AC
  Administered 2020-08-24: 150 mg via INTRAVENOUS
  Filled 2020-08-24: qty 150

## 2020-08-24 MED ORDER — POTASSIUM CHLORIDE CRYS ER 20 MEQ PO TBCR
40.0000 meq | EXTENDED_RELEASE_TABLET | Freq: Once | ORAL | Status: AC
  Administered 2020-08-24: 40 meq via ORAL
  Filled 2020-08-24: qty 2

## 2020-08-24 MED ORDER — SODIUM CHLORIDE 0.9 % IV SOLN
10.0000 mg | Freq: Once | INTRAVENOUS | Status: AC
  Administered 2020-08-24: 10 mg via INTRAVENOUS
  Filled 2020-08-24: qty 10

## 2020-08-24 MED ORDER — SODIUM CHLORIDE 0.9 % IV SOLN
150.0000 mg/m2 | Freq: Once | INTRAVENOUS | Status: AC
  Administered 2020-08-24: 280 mg via INTRAVENOUS
  Filled 2020-08-24: qty 14

## 2020-08-24 MED ORDER — SODIUM CHLORIDE 0.9% FLUSH
10.0000 mL | INTRAVENOUS | Status: DC | PRN
  Administered 2020-08-24: 10 mL

## 2020-08-24 MED ORDER — DEXTROSE 5 % IV SOLN: Freq: Once | INTRAVENOUS | Status: AC

## 2020-08-24 NOTE — Progress Notes (Signed)
Patient was assessed by Dr. Delton Coombes and labs have been reviewed.  Potassium 3.1, orders received for potassium 40 mEq by mouth once today.  He will start taking the potassium already prescribed to him at home starting tomorrow.  Patient is okay to proceed with treatment today. Primary RN and pharmacy aware.

## 2020-08-24 NOTE — Patient Instructions (Signed)
[facility] Discharge Instructions for Patients Receiving Chemotherapy   Beginning January 23rd 2017 lab work for the [facility] will be done in the  Main lab at [name] on 1st floor. If you have a lab appointment with the [facility] please come in thru the  Main Entrance and check in at the main information desk   Today you received the following chemotherapy agents FOLFIRINOX.Follow-up as scheduled  To help prevent nausea and vomiting after your treatment, we encourage you to take your nausea medication   If you develop nausea and vomiting, or diarrhea that is not controlled by your medication, call the clinic.  The clinic phone number is [phone]. Office hours are Monday-Friday 8:30am-5:00pm.  BELOW ARE SYMPTOMS THAT SHOULD BE REPORTED IMMEDIATELY:  *FEVER GREATER THAN 101.0 F  *CHILLS WITH OR WITHOUT FEVER  NAUSEA AND VOMITING THAT IS NOT CONTROLLED WITH YOUR NAUSEA MEDICATION  *UNUSUAL SHORTNESS OF BREATH  *UNUSUAL BRUISING OR BLEEDING  TENDERNESS IN MOUTH AND THROAT WITH OR WITHOUT PRESENCE OF ULCERS  *URINARY PROBLEMS  *BOWEL PROBLEMS  UNUSUAL RASH Items with * indicate a potential emergency and should be followed up as soon as possible. If you have an emergency after office hours please contact your primary care physician or go to the nearest emergency department.  Please call the clinic during office hours if you have any questions or concerns.   You may also contact the Patient Navigator at [phone] should you have any questions or need assistance in obtaining follow up care.      Resources For Cancer Patients and their Caregivers ? American Cancer Society: Can assist with transportation, wigs, general needs, runs Look Good Feel Better.        (567)075-5856 ? Cancer Care: Provides financial assistance, online support groups, medication/co-pay assistance.  1-800-813-HOPE 434-098-3661) ? [employer] Assists [place] cancer patients and their families through emotional , educational and financial support.  (714)570-1286 ? [place] DSS Where to apply for food stamps, Medicaid and utility assistance. 213-607-2154 ? [employer]: Transportation to medical appointments. 320-274-0689 ? Social Security Administration: May apply for disability if have a Stage IV cancer. 415-758-8341 979-622-9697 ? [employer], Disability and Transit Services: Assists with nutrition, care and transit needs. 609-428-1692

## 2020-08-24 NOTE — Patient Instructions (Signed)
[place] at [facility] Discharge Instructions  You were seen today by Dr. Delton Coombes. He went over your recent results. You received your treatment today. Continue taking potassium twice daily to maintain your blood potassium levels. You will have a repeat PET scan in 3 weeks after your next cycle. Dr. Delton Coombes will see you back in 2 weeks for labs and follow up.   Thank you for choosing [place] at [facility] to provide your oncology and hematology care.  To afford each patient quality time with our provider, please arrive at least 15 minutes before your scheduled appointment time.   If you have a lab appointment with the [place] please come in thru the Main Entrance and check in at the main information desk  You need to re-schedule your appointment should you arrive 10 or more minutes late.  We strive to give you quality time with our providers, and arriving late affects you and other patients whose appointments are after yours.  Also, if you no show three or more times for appointments you may be dismissed from the clinic at the providers discretion.     Again, thank you for choosing [facility].  Our hope is that these requests will decrease the amount of time that you wait before being seen by our physicians.       _____________________________________________________________  Should you have questions after your visit to [facility], please contact our office at [phone] between the hours of 8:00 a.m. and 4:30 p.m.  Voicemails left after 4:00 p.m. will not be returned until the following business day.  For prescription refill requests, have your pharmacy contact our office and allow 72 hours.    Cancer Center Support Programs:   > Cancer Support Group  2nd Tuesday of the month 1pm-2pm, Journey Room

## 2020-08-24 NOTE — Progress Notes (Signed)
John Islam Sr. tolerated FOLFIRINOX infusions well without complaints or incident. Pt discharged with 5FU pump infusing without issues. VSS upon discharge. Pt discharged via wheelchair in satisfactory condition

## 2020-08-24 NOTE — Progress Notes (Signed)
[place], [place] [address]   CLINIC:  Medical Oncology/Hematology  PCP:  John Mayo, NP [address] 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 The report date is 06/18/2020.   The Common Hereditary Cancers Panel offered by [facility] includes sequencing and/or deletion duplication testing of the following 48 genes: APC, ATM, AXIN2, BARD1, BMPR1A, BRCA1, BRCA2, BRIP1, CDH1, CDK4, CDKN2A (p14ARF), CDKN2A (p16INK4a), CHEK2, CTNNA1, DICER1, EPCAM (Deletion/duplication testing only), GREM1 (promoter region deletion/duplication testing only), KIT, MEN1, MLH1, MSH2, MSH3, MSH6, MUTYH, NBN, NF1, NTHL1, PALB2, PDGFRA, PMS2, POLD1, POLE, PTEN, RAD50, RAD51C, RAD51D, RNF43, SDHB, SDHC, SDHD, SMAD4, SMARCA4. STK11, TP53, TSC1, TSC2, and VHL.  The following genes were evaluated for sequence changes only: SDHA and HOXB13 c.251G>A variant only. The Pancreatic Cancer Panel offered by [facility] includes sequencing and deletion/duplication analysis of the following 29 genes: APC, ATM, BMPR1A, BRCA1, BRCA2, CASR, CDK4, CDKN2A, CFTR, CPA1, CTRC, EPCAM, FANCC, MEN1, MLH1, MSH2, MSH6, NF1, PALB2, PALLD, PMS2, PRSS1, SMAD4, SPINK1, STK11, TP53, TSC1, TSC2, and VHL.   06/23/2020 -  Chemotherapy   The patient had palonosetron (ALOXI) injection 0.25 mg, 0.25 mg, Intravenous,  Once, 4 of 6  cycles Administration: 0.25 mg (06/23/2020), 0.25 mg (07/07/2020), 0.25 mg (07/21/2020), 0.25 mg (08/04/2020) irinotecan (CAMPTOSAR) 160 mg in sodium chloride 0.9 % 500 mL chemo infusion, 90 mg/m2 = 160 mg (60 % of original dose 150 mg/m2), Intravenous,  Once, 3 of 5 cycles Dose modification: 90 mg/m2 (60 % of original dose 150 mg/m2, Cycle 2, Reason: Change in LFTs), 100 mg/m2 (original dose 150 mg/m2, Cycle 3, Reason: Provider Judgment), 150 mg/m2 (original dose 150 mg/m2, Cycle 4, Reason: Provider Judgment) Administration: 160 mg (07/07/2020), 180 mg (07/21/2020), 280 mg (08/04/2020) oxaliplatin (ELOXATIN) 115 mg in dextrose 5 % 500 mL chemo infusion, 63.75 mg/m2 = 115 mg (75 % of original dose 85 mg/m2), Intravenous,  Once, 4 of 6 cycles Dose modification: 63.75 mg/m2 (75 % of original dose 85 mg/m2, Cycle 1, Reason: Provider Judgment), 68 mg/m2 (80 % of original dose 85 mg/m2, Cycle 2, Reason: Provider Judgment), 68 mg/m2 (80 % of original dose 85 mg/m2, Cycle 4, Reason: Other (see comments), Comment: numbness) Administration: 115 mg (06/23/2020), 125 mg (07/07/2020), 150 mg (07/21/2020), 125 mg (08/04/2020) fosaprepitant (EMEND) 150 mg in sodium chloride 0.9 % 145 mL IVPB, 150 mg, Intravenous,  Once, 4 of 6 cycles Administration: 150 mg (06/23/2020), 150 mg (07/07/2020), 150 mg (07/21/2020), 150 mg (08/04/2020) fluorouracil (ADRUCIL) 2,500 mg in sodium chloride 0.9 % 100 mL chemo infusion, 1,370 mg/m2 = 2,650 mg (60 % of original dose 2,400 mg/m2), Intravenous, 1 Day/Dose, 4 of 6 cycles Dose modification: 1,800 mg/m2 (75 % of original dose 2,400 mg/m2, Cycle 1, Reason: Provider Judgment), 1,440 mg/m2 (60 % of original dose 2,400 mg/m2, Cycle 1, Reason: Change in LFTs) Administration: 2,500 mg (06/23/2020), 4,400 mg (07/07/2020), 4,400 mg (07/21/2020), 4,400 mg (08/04/2020)  leucovorin 440 mg in dextrose 5 % 250 mL infusion, 240 mg/m2 = 440 mg (60 % of original dose 400 mg/m2), Intravenous,  Once, 4 of  6 cycles Dose modification: 300 mg/m2 (75 % of original dose 400 mg/m2, Cycle 1, Reason: Provider Judgment), 240 mg/m2 (60 % of original dose 400 mg/m2, Cycle 1, Reason: Change in LFTs) Administration: 440 mg (06/23/2020), 732 mg (07/07/2020), 732 mg (07/21/2020), 700 mg (08/04/2020)  for chemotherapy treatment.      CANCER STAGING: Cancer Staging No matching staging information was found for the patient.  INTERVAL HISTORY:  [name], a 69 y.o. male, returns for routine follow-up and consideration for next cycle of chemotherapy. [name] was last seen on 08/04/2020.  Due for cycle #5 of FOLFIRINOX and Aloxi today.   Overall, he tells me he has been feeling fair. He tolerated the previous treatment well and denies having abdominal pain or diarrhea, though he takes oxycodone 10 mg BID as needed for the pain. He reports having constant numbness and intermittent tingling in his fingers and toes and extreme cold sensitivity; he has trouble opening bottle caps, but denies dropping things. He is not taking potassium daily, though he takes [name] 25 units at bedtime.  He started using his old glucometer, but he still uses it rarely. He will return to see the endocrinologist in February.  Overall, he feels ready for next cycle of chemo today.    REVIEW OF SYSTEMS:  Review of Systems  Constitutional: Positive for appetite change (50%) and fatigue (50%).  HENT:   Positive for trouble swallowing (cold liquids feel like tacks).   Gastrointestinal: Positive for constipation.  Genitourinary: Positive for frequency.   Neurological: Positive for numbness (hands & feet).  Psychiatric/Behavioral: The patient is nervous/anxious.   All other systems reviewed and are negative.   PAST MEDICAL/SURGICAL HISTORY:  Past Medical History:  Diagnosis Date  . Acute blood loss anemia   . Arthritis   . Benign essential HTN   . Cellulitis 07/20/2018  . Cellulitis in diabetic foot ([facility]) 07/20/2018   . Diabetes mellitus without complication ([facility])   . Diabetes mellitus, type II ([facility]) 07/20/2018  . Dry gangrene ([facility]) 07/20/2018  . Emesis   . Family history of cervical cancer   . Heart murmur   . Hyperglycemia   . Hypertension   . Joint pain   . Port-A-Cath in place 05/25/2020  . Unilateral complete BKA, left, initial encounter [facility])    Past Surgical History:  Procedure Laterality Date  . AMPUTATION Left 08/02/2018   Procedure: LEFT AMPUTATION BELOW KNEE;  Surgeon: Marty Heck, MD;  Location: [facility];  Service: Vascular;  Laterality: Left;  . BILIARY STENT PLACEMENT N/A 06/11/2020   Procedure: BILIARY STENT PLACEMENT;  Surgeon: Rogene Houston, MD;  [facility];  Service: Endoscopy;  Laterality: N/A;  . ELBOW SURGERY     Bone graft to repair elbow  . ERCP N/A 06/11/2020   Procedure: ENDOSCOPIC RETROGRADE CHOLANGIOPANCREATOGRAPHY (ERCP);  Surgeon: Rogene Houston, MD;  [facility];  Service: Endoscopy;  Laterality: N/A;  . LOWER EXTREMITY ANGIOGRAPHY N/A 07/24/2018   Procedure: LOWER EXTREMITY ANGIOGRAPHY;  Surgeon: Marty Heck, MD;  Location: [facility];  Service: Cardiovascular;  Laterality: N/A;  . PERIPHERAL VASCULAR INTERVENTION  07/24/2018   Procedure: PERIPHERAL VASCULAR INTERVENTION;  Surgeon: Marty Heck, MD;  Location: [facility];  Service: Cardiovascular;;  . PORTACATH PLACEMENT Left 05/21/2020   Procedure:  INSERTION PORT-A-CATH;  Surgeon: Aviva Signs, MD;  Location[facility];  Service: General;  Laterality: Left;  . SPHINCTEROTOMY  06/11/2020   Procedure: SHORT SPHINCTEROTOMY;  Surgeon: Rogene Houston, MD;  [facility];  Service: Endoscopy;;  . TONSILLECTOMY    . TRANSMETATARSAL AMPUTATION Left 07/25/2018   Procedure: LEFT GREAT TOE AMPUTATION;  Surgeon: Marty Heck, MD;  Location: [facility];  Service: Vascular;  Laterality: Left;  . TRANSMETATARSAL AMPUTATION Left 07/30/2018   Procedure:  TRANSMETATARSAL AMPUTATION;  Surgeon: Waynetta Sandy, MD;  Location: [facility];  Service: Vascular;  Laterality: Left;    SOCIAL HISTORY:  Social History   Socioeconomic History  . Marital status: Married    Spouse name: [name]  . Number of children: 4  . Years of education: Not on file  . Highest education level: Not on file  Occupational History  . Not on file  Tobacco Use  . Smoking status: Current Every Day Smoker    Packs/day: 0.25    Types: Cigarettes  . Smokeless tobacco: Former Systems developer    Types: Snuff    Quit date: 04/21/1989  Vaping Use  . Vaping Use: Never used  Substance and Sexual Activity  . Alcohol use: Not Currently    Alcohol/week: 0.0 standard drinks    Comment: stopped 2 years ago  . Drug use: No  . Sexual activity: Not Currently  Other Topics Concern  . Not on file  Social History Narrative   Lives with wife [name]- married 65 years    4 children with wife. 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 Difficulty of Paying Living Expenses: Not hard at all  Food Insecurity: No Food Insecurity  . Worried About Charity fundraiser in the Last Year: Never true  . Ran Out of Food in the Last Year: Never true  Transportation Needs: No Transportation Needs  . Lack of Transportation (Medical): No  . Lack of Transportation (Non-Medical): No  Physical Activity: Inactive  . Days of Exercise per Week: 0 days  . Minutes of Exercise per Session: 0 min  Stress: No Stress Concern Present  . Feeling of Stress : Only a little  Social Connections: Moderately  Isolated  . Frequency of Communication with Friends and Family: More than three times a week  . Frequency of Social Gatherings with Friends and Family: Twice a week  . Attends Religious Services: Never  . Active Member of Clubs or Organizations: No  . Attends Archivist Meetings: Never  . Marital Status: Married  Human resources officer Violence: Not At Risk  . Fear of Current or Ex-Partner: No  . Emotionally Abused: No  . Physically Abused: No  . Sexually Abused: No    FAMILY HISTORY:  Family History  Problem Relation Age of Onset  . Alcoholism Mother   . Alcoholism Father   .  Cervical cancer Sister        dx. late 32s  . Cervical cancer Sister        dx. late 26s    CURRENT MEDICATIONS:  Current Outpatient Medications  Medication Sig Dispense Refill  . Accu-Chek Softclix Lancets lancets 1 each by Other route in the morning, at noon, in the evening, and at bedtime. Dx: E11.65    . acetaminophen (TYLENOL) 500 MG tablet Take 500-1,000 mg by mouth every 6 (six) hours as needed (for pain).     . bisacodyl (DULCOLAX) 5 MG EC tablet Take 5 mg by mouth daily as needed for moderate constipation.     . blood glucose meter kit and supplies KIT Dispense based on patient and insurance preference. Use up to four times daily as directed. (FOR ICD-9 250.00, 250.01). 1 each 0  . Blood Glucose Monitoring Suppl (ACCU-CHEK GUIDE ME) w/Device KIT 1 Piece by Does not apply route as directed. 1 kit 0  . dexamethasone (DECADRON) 10 MG/ML injection Inject 10 mg into the vein every 14 (fourteen) days. Dexamethasone 10mg  in NS 50 ml IVPB prior to chemotherapy administration     . fluorouracil CALGB 06301 in sodium chloride 0.9 % 150 mL Inject 2,400 mg/m2 into the vein over 48 hr.     . fosaprepitant (EMEND) 150 MG SOLR injection Inject 150 mg into the vein every 14 (fourteen) days. Prior to chemotherapy administration     . glucose blood (ACCU-CHEK GUIDE) test strip Test BG 4 x daily. DX E11.65 150  each 5  . insulin degludec (TRESIBA FLEXTOUCH) 100 UNIT/ML FlexTouch Pen Inject 25 Units into the skin at bedtime. 30 mL 2  . Insulin Pen Needle 32G X 4 MM MISC Use to inject insulin 100 each 1  . IRINOTECAN HCL IV Inject 280 mg/m2 into the vein every 14 (fourteen) days.     Marland Kitchen LEUCOVORIN CALCIUM IV Inject 400 mg/m2 into the vein every 14 (fourteen) days.     Marland Kitchen lipase/protease/amylase (CREON) 36000 UNITS CPEP capsule Take 1 capsule (36,000 Units total) by mouth 3 (three) times daily with meals AND 1 capsule (36,000 Units total) with snacks. 240 capsule 2  . metFORMIN (GLUCOPHAGE) 500 MG tablet Take 1,000 mg by mouth 2 (two) times daily.    . OXALIPLATIN IV Inject 85 mg/m2 into the vein every 14 (fourteen) days.     Marland Kitchen oxyCODONE (ROXICODONE) 5 MG immediate release tablet Take 2 tablets (10 mg total) by mouth every 6 (six) hours as needed for moderate pain or severe pain. 120 tablet 0  . palonosetron (ALOXI) 0.25 MG/5ML injection Inject 0.25 mg into the vein every 14 (fourteen) days. Prior to chemotherapy administration     . potassium chloride SA (KLOR-CON) 20 MEQ tablet Take 1 tablet (20 mEq total) by mouth 2 (two) times daily. 8 tablet 0  . tamsulosin (FLOMAX) 0.4 MG CAPS capsule Take 1 capsule (0.4 mg total) by mouth at bedtime. 30 capsule 2  . lidocaine-prilocaine (EMLA) cream Apply a small amount to port a cath site and cover with plastic wrap 1 hour prior to chemotherapy appointments (Patient not taking: Reported on 08/24/2020) 30 g 3  . prochlorperazine (COMPAZINE) 10 MG tablet Take 1 tablet (10 mg total) by mouth every 6 (six) hours as needed for nausea or vomiting. (Patient not taking: Reported on 08/24/2020) 30 tablet 2   No current facility-administered medications for this visit.   Facility-Administered Medications Ordered in Other Visits  Medication Dose Route Frequency Provider  Last Rate Last Admin  . potassium chloride SA (KLOR-CON) CR tablet 40 mEq  40 mEq Oral Once Derek Jack, MD        ALLERGIES:  Allergies  Allergen Reactions  . Tea Itching and Rash    PHYSICAL EXAM:  Performance status (ECOG): 1 - Symptomatic but completely ambulatory  Vitals:   08/24/20 0742  BP: 132/75  Pulse: 89  Resp: 16  Temp: (!) 97.2 F (36.2 C)  SpO2: 100%   Wt Readings from Last 3 Encounters:  08/24/20 130 lb 3.2 oz (59.1 kg)  08/04/20 136 lb 3.2 oz (61.8 kg)  08/02/20 132 lb (59.9 kg)   Physical Exam Vitals reviewed.  Constitutional:      Appearance: Normal appearance.  Cardiovascular:     Rate and Rhythm: Normal rate and regular rhythm.     Pulses: Normal pulses.     Heart sounds: Normal heart sounds.  Pulmonary:     Effort: Pulmonary effort is normal.     Breath sounds: Normal breath sounds.  Chest:     Comments: Port-a-Cath in L chest Musculoskeletal:     Right lower leg: No edema.     Left Lower Extremity: Left leg is amputated below knee.  Neurological:     General: No focal deficit present.     Mental Status: He is alert and oriented to person, place, and time.  Psychiatric:        Mood and Affect: Mood normal.        Behavior: Behavior normal.     LABORATORY DATA:  I have reviewed the labs as listed.  CBC Latest Ref Rng & Units 08/24/2020 08/04/2020 07/29/2020  WBC 4.0 - 10.5 K/uL 5.7 12.6(H) 10.1  Hemoglobin 13.0 - 17.0 g/dL 11.2(L) 10.7(L) 11.6(L)  Hematocrit 39 - 52 % 34.8(L) 33.3(L) 37.2(L)  Platelets 150 - 400 K/uL 141(L) 159 206   CMP Latest Ref Rng & Units 08/24/2020 08/04/2020 08/04/2020  Glucose 70 - 99 mg/dL 254(H) 123(H) 49(L)  BUN 8 - 23 mg/dL 9 - 11  Creatinine 0.61 - 1.24 mg/dL 0.55(L) - 0.42(L)  Sodium 135 - 145 mmol/L 139 - 135  Potassium 3.5 - 5.1 mmol/L 3.1(L) - 3.7  Chloride 98 - 111 mmol/L 104 - 102  CO2 22 - 32 mmol/L 27 - 25  Calcium 8.9 - 10.3 mg/dL 8.8(L) - 8.6(L)  Total Protein 6.5 - 8.1 g/dL 6.5 - 6.5  Total Bilirubin 0.3 - 1.2 mg/dL 0.7 - 1.1  Alkaline Phos 38 - 126 U/L 348(H) - 301(H)  AST 15 -  41 U/L 43(H) - 84(H)  ALT 0 - 44 U/L 40 - 55(H)   Lab Results  Component Value Date   YQM578 85 (H) 07/29/2020   ION629 12 04/25/2020    DIAGNOSTIC IMAGING:  I have independently reviewed the scans and discussed with the patient. US Venous Img Upper Uni Left (DVT)  Result Date: 07/30/2020 CLINICAL DATA:  Left shoulder pain following porta catheter placement on 05/21/2020. EXAM: LEFT UPPER EXTREMITY VENOUS DOPPLER ULTRASOUND TECHNIQUE: Gray-scale sonography with graded compression, as well as color Doppler and duplex ultrasound were performed to evaluate the upper extremity deep venous system from the level of the subclavian vein and including the jugular, axillary, basilic, radial, ulnar and upper cephalic vein. Spectral Doppler was utilized to evaluate flow at rest and with distal augmentation maneuvers. COMPARISON:  None. FINDINGS: Contralateral Subclavian Vein: Respiratory phasicity is normal and symmetric with the symptomatic side. No evidence of thrombus. Normal  compressibility. Internal Jugular Vein: No evidence of thrombus. Normal compressibility, respiratory phasicity and response to augmentation. Subclavian Vein: No evidence of thrombus. Normal compressibility, respiratory phasicity and response to augmentation. Axillary Vein: No evidence of thrombus. Normal compressibility, respiratory phasicity and response to augmentation. Cephalic Vein: Not well visualized due to its small size. No gross thrombus seen. Basilic Vein: No evidence of thrombus. Normal compressibility, respiratory phasicity and response to augmentation. Brachial Veins: No evidence of thrombus. Normal compressibility, respiratory phasicity and response to augmentation. Radial Veins: No evidence of thrombus. Normal compressibility, respiratory phasicity and response to augmentation. Ulnar Veins: No evidence of thrombus. Normal compressibility, respiratory phasicity and response to augmentation. Venous Reflux:  None visualized.  Other Findings:  None visualized. IMPRESSION: No evidence of DVT within the left upper extremity. Electronically Signed   By: Claudie Revering M.D.   On: 07/30/2020 12:49     ASSESSMENT:  1. 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 -Current active smoker, 1 pack/day for 47 years. No history of alcohol use. No family history of malignancy. -CT AP with contrast on 04/24/2020 shows 2.2 cm pancreatic head mass, small hypodense lesions throughout the bilateral lower lobes, predominantly in the right lower lobe, largest measuring 1.1 cm. 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 Mildly prominent lymph nodes within the upper abdomen above the pancreas head suspicious for metastatic lymphadenopathy. 5 mm pleural-based right lower lobe nodule. -CA 19-9 is 12. -Liver biopsy on 05/03/2020 consistent with metastatic adenocarcinoma, positive for CK7, CK20, CDX2. Differential diagnosis includes primary upper GI versus pancreaticobiliary. -PET scan on 05/19/2020 shows FDG avid had a pancreatic mass, multifocal low-attenuation lesions scattered throughout both lobes of the liver compatible with biopsy-proven liver metastasis. No FDG avid lymph nodes in the abdomen. No evidence of metastasis in the chest or bones. 2 small lung nodules within the right lower lobe measuring less than 5 mm. -ERCP and Wallstent placement on 06/11/2020.  2. Poorly controlled diabetes: -Hemoglobin A1c was 10.8 during hospitalization.  3. Abdominal pain: -Highly likely malignancy related. -He is currently on hydrocodone 5/325 1 to 2 tablets every 4 hours as needed. Patient reports that he still has pain but manageable.  4. Right kidney mass: -No further work-up needed if biopsy of liver lesion confirms pancreatic adenocarcinoma.   PLAN:  1.Metastatic pancreatic cancer to the  liver: -He has tolerated last cycle reasonably well. -He has tingling on and off in the fingertips and constant tingling in the toes. -I have reviewed his labs.  AST improved to 43 from 84.  Total bilirubin is normal. -We will proceed with his cycle 5 today.  He will come back in 2 weeks for follow-up. -We plan to repeat PET CT scan after cycle 6.  CA 19-9 level from today is pending.  2. Poorly controlled diabetes: -Continue Tresiba 25 units at bedtime.  Continue follow-up with Dr. Dorris Fetch. -Very noncompliant at checking his blood sugars.  3. Abdominal pain: -Pain has improved since start of chemotherapy.  He is requiring oxycodone 10 mg up to 2 tablets/day.  Some days he does not require any pain medication.  4. Right kidney mass: -Right kidney lesion suspicious for RCC.  Due to his metastatic pancreatic cancer, we did not pursue any further work-up.  5. Genetic testing: -Invitae testing is negative.  6. Hypokalemia: -He is not taking potassium on regular basis.  He was told  to take potassium 40 mEq daily.  7. Urinary frequency: -Continue Flomax 0.4 mg at bedtime.   Orders placed this encounter:  Orders Placed This Encounter  Procedures  . NM PET Image Restag (PS) Skull Base To Thigh     Derek Jack, MD [place] [phone]   I, Milinda Antis, am acting as a scribe for Dr. Sanda Linger.  I, Derek Jack MD, have reviewed the above documentation for accuracy and completeness, and I agree with the above.

## 2020-08-25 LAB — CANCER ANTIGEN 19-9: CA 19-9: 126 U/mL — ABNORMAL HIGH (ref 0–35)

## 2020-08-26 ENCOUNTER — Other Ambulatory Visit: Payer: Self-pay

## 2020-08-26 ENCOUNTER — Inpatient Hospital Stay (HOSPITAL_COMMUNITY): Payer: Medicare Other | Attending: Hematology

## 2020-08-26 ENCOUNTER — Encounter (HOSPITAL_COMMUNITY): Payer: Self-pay

## 2020-08-26 DIAGNOSIS — C787 Secondary malignant neoplasm of liver and intrahepatic bile duct: Secondary | ICD-10-CM | POA: Diagnosis not present

## 2020-08-26 DIAGNOSIS — Z95828 Presence of other vascular implants and grafts: Secondary | ICD-10-CM

## 2020-08-26 DIAGNOSIS — Z5111 Encounter for antineoplastic chemotherapy: Secondary | ICD-10-CM | POA: Diagnosis present

## 2020-08-26 DIAGNOSIS — E876 Hypokalemia: Secondary | ICD-10-CM | POA: Diagnosis present

## 2020-08-26 DIAGNOSIS — C250 Malignant neoplasm of head of pancreas: Secondary | ICD-10-CM | POA: Insufficient documentation

## 2020-08-26 DIAGNOSIS — C259 Malignant neoplasm of pancreas, unspecified: Secondary | ICD-10-CM

## 2020-08-26 DIAGNOSIS — E119 Type 2 diabetes mellitus without complications: Secondary | ICD-10-CM | POA: Diagnosis not present

## 2020-08-26 MED ORDER — SODIUM CHLORIDE 0.9% FLUSH
10.0000 mL | INTRAVENOUS | Status: DC | PRN
  Administered 2020-08-26: 10 mL

## 2020-08-26 MED ORDER — HEPARIN SOD (PORK) LOCK FLUSH 100 UNIT/ML IV SOLN
500.0000 [IU] | Freq: Once | INTRAVENOUS | Status: AC | PRN
  Administered 2020-08-26: 500 [IU]

## 2020-08-26 NOTE — Progress Notes (Signed)
John Islam Sr. presents to have home infusion pump d/c'd and for port-a-cath deaccess with flush.  Portacath located left chest wall accessed with  H 20 needle.  Good blood return present. Portacath flushed with NS and 500U/87ml Heparin, and needle removed intact.  Procedure tolerated well and without incident.  Discharged via wheelchair in stable condition.

## 2020-08-27 ENCOUNTER — Inpatient Hospital Stay (HOSPITAL_COMMUNITY): Payer: Medicare Other

## 2020-08-27 NOTE — Progress Notes (Signed)
Nutrition Follow-up:  Patient with metastatic pancreatic cancer.  Patient receiving folfirinox.    Met with patient in clinic today.  Patient reports that appetite is good except when he gets chemotherapy for 3 days.  Appetite is decreased during those 3 days.  Yesterday ate cereal about 8am, 1/2 banana around 1:30pm and 2 kielbasa links around 10 pm and jello.  Says that he is drinking 4 boost/ensure (glucose control) shakes per day,every day.    Patient has not checked blood glucose in about 3 days or more.    Medications: reviewed  Labs: reviewed  Anthropometrics:   Weight 126 lb 7 oz today on scale in clinic decreased from 130 lb 3.2 oz on 11/30  131 10/7   NUTRITION DIAGNOSIS: Unintentional weight loss continues   INTERVENTION:  Patient to continue boost glucose control/glucerna shakes 4 times per day (provides ~760 calories and ~ 52 g protein). Recommend adding snack between breakfast and lunch and lunch and supper as patient says appetite is good (ie peanut butter crackers, yogurt) Talked about having at least 2-3 different food items at meal time (sandwich and chips or meat and vegetable, cereal and egg).   Encouraged patient to check blood glucose and take diabetes medication as prescribed to help control blood glucose.  RD has met with patient on multiple occassions and provided written education materials, handwritten meal plan for patient to follow and patient has been non compliant.   Contact information provided to patient and encouraged patient to reach out to RD if needed   NEXT VISIT: not planned at this time. RD always available if needed  Aleaha Fickling B. Zenia Resides, [place], [place] Registered Dietitian [phone] (mobile)

## 2020-09-06 NOTE — Progress Notes (Signed)
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 The report date is 06/18/2020.   The Common Hereditary Cancers Panel offered by [facility] includes sequencing and/or deletion duplication testing of the following 48 genes: APC, ATM, AXIN2, BARD1, BMPR1A, BRCA1, BRCA2, BRIP1, CDH1, CDK4, CDKN2A (p14ARF), CDKN2A (p16INK4a), CHEK2, CTNNA1, DICER1, EPCAM (Deletion/duplication testing only), GREM1 (promoter region deletion/duplication testing only), KIT, MEN1, MLH1, MSH2, MSH3, MSH6, MUTYH, NBN, NF1, NTHL1, PALB2, PDGFRA, PMS2, POLD1, POLE, PTEN, RAD50, RAD51C, RAD51D, RNF43, SDHB, SDHC, SDHD, SMAD4, SMARCA4. STK11, TP53, TSC1, TSC2, and VHL.  The following genes were evaluated for sequence changes only: SDHA and HOXB13 c.251G>A variant only. The Pancreatic Cancer Panel offered by [facility] includes sequencing and deletion/duplication analysis of the following 29 genes: APC, ATM, BMPR1A, BRCA1, BRCA2, CASR, CDK4, CDKN2A, CFTR, CPA1, CTRC, EPCAM, FANCC, MEN1, MLH1, MSH2, MSH6, NF1, PALB2, PALLD, PMS2, PRSS1, SMAD4, SPINK1, STK11, TP53, TSC1, TSC2, and VHL.   06/23/2020 -  Chemotherapy   The patient had palonosetron (ALOXI) injection 0.25 mg, 0.25 mg, Intravenous,  Once, 6 of 8 cycles Administration: 0.25 mg (06/23/2020), 0.25 mg (07/07/2020), 0.25 mg (07/21/2020), 0.25 mg (08/04/2020), 0.25 mg  (08/24/2020) irinotecan (CAMPTOSAR) 160 mg in sodium chloride 0.9 % 500 mL chemo infusion, 90 mg/m2 = 160 mg (60 % of original dose 150 mg/m2), Intravenous,  Once, 5 of 7 cycles Dose modification: 90 mg/m2 (60 % of original dose 150 mg/m2, Cycle 2, Reason: Change in LFTs), 100 mg/m2 (original dose 150 mg/m2, Cycle 3, Reason: Provider Judgment), 150 mg/m2 (original dose 150 mg/m2, Cycle 4, Reason: Provider Judgment) Administration: 160 mg (07/07/2020), 180 mg (07/21/2020), 280 mg (08/04/2020), 280 mg (08/24/2020) oxaliplatin (ELOXATIN) 115 mg in dextrose 5 % 500 mL chemo infusion, 63.75 mg/m2 = 115 mg (75 % of original dose 85 mg/m2), Intravenous,  Once, 6 of 8 cycles Dose modification: 63.75 mg/m2 (75 % of original dose 85 mg/m2, Cycle 1, Reason: Provider Judgment), 68 mg/m2 (80 % of original dose 85 mg/m2, Cycle 2, Reason: Provider Judgment), 68 mg/m2 (80 % of original dose 85 mg/m2, Cycle 4, Reason: Other (see comments), Comment: numbness), 50 mg/m2 (original dose 85 mg/m2, Cycle 6, Reason: Provider Judgment) Administration: 115 mg (06/23/2020), 125 mg (07/07/2020), 150 mg (07/21/2020), 125 mg (08/04/2020), 125 mg (08/24/2020) fosaprepitant (EMEND) 150 mg in sodium chloride 0.9 % 145 mL IVPB, 150 mg, Intravenous,  Once, 6 of 8 cycles Administration: 150 mg (06/23/2020), 150 mg (07/07/2020), 150 mg (07/21/2020), 150 mg (08/04/2020), 150 mg (08/24/2020) fluorouracil (ADRUCIL) 2,500 mg in sodium chloride 0.9 % 100 mL chemo infusion, 1,370 mg/m2 = 2,650 mg (60 % of original dose 2,400 mg/m2), Intravenous, 1 Day/Dose, 6 of 8 cycles Dose modification: 1,800 mg/m2 (75 % of original dose 2,400 mg/m2, Cycle 1, Reason: Provider Judgment), 1,440 mg/m2 (60 % of original dose 2,400 mg/m2, Cycle 1, Reason: Change in LFTs) Administration: 2,500 mg (06/23/2020), 4,400 mg (07/07/2020), 4,400 mg (07/21/2020), 4,400 mg (  08/04/2020), 4,400 mg (08/24/2020) leucovorin 440 mg in dextrose 5 % 250 mL infusion, 240 mg/m2 = 440 mg  (60 % of original dose 400 mg/m2), Intravenous,  Once, 6 of 8 cycles Dose modification: 300 mg/m2 (75 % of original dose 400 mg/m2, Cycle 1, Reason: Provider Judgment), 240 mg/m2 (60 % of original dose 400 mg/m2, Cycle 1, Reason: Change in LFTs) Administration: 440 mg (06/23/2020), 732 mg (07/07/2020), 732 mg (07/21/2020), 700 mg (08/04/2020), 700 mg (08/24/2020)  for chemotherapy treatment.       CURRENT THERAPY:  FOLFIRINOX & Aloxi every 2 weeks starting 06/23/20. He is s/p 5 cycles.   INTERVAL HISTORY:  John Darley Sr. is here for a follow up and treatment. He is under the care of Dr Delton Coombes and is seeing me today in interim.  He is tolerating chemotherapy moderately well, with fatigue, oral sensitivity, and slightly worse of peripheral neuropathy.  He noticed slightly balance issue, had a fall at home 5 days ago, no injury.  He denies any significant nausea, diarrhea, or other new symptoms.  He is up 50% of time during the day.  He is taking oxycodone 5 mg twice a day for abdominal pain, per patient.  He is also seen endocrinologist for his poorly controlled diabetes.  Per patient, he is taking insulin 70 units twice daily, he does not check his sugar level at home.  He has lost about 10 pounds in the past months.  He does take Booster occasionally, so nutrition a while ago.  All other systems were reviewed with the patient and are negative.  MEDICAL HISTORY:  Past Medical History:  Diagnosis Date  . Acute blood loss anemia   . Arthritis   . Benign essential HTN   . Cellulitis 07/20/2018  . Cellulitis in diabetic foot ([place]) 07/20/2018  . Diabetes mellitus without complication ([place])   . Diabetes mellitus, type II ([place]) 07/20/2018  . Dry gangrene ([place]) 07/20/2018  . Emesis   . Family history of cervical cancer   . Heart murmur   . Hyperglycemia   . Hypertension   . Joint pain   . Port-A-Cath in place 05/25/2020  . Unilateral complete BKA, left, initial encounter [facility])      SURGICAL HISTORY: Past Surgical History:  Procedure Laterality Date  . AMPUTATION Left 08/02/2018   Procedure: LEFT AMPUTATION BELOW KNEE;  Surgeon: Marty Heck, MD;  Location: [place];  Service: Vascular;  Laterality: Left;  . BILIARY STENT PLACEMENT N/A 06/11/2020   Procedure: BILIARY STENT PLACEMENT;  Surgeon: Rogene Houston, MD;  [facility];  Service: Endoscopy;  Laterality: N/A;  . ELBOW SURGERY     Bone graft to repair elbow  . ERCP N/A 06/11/2020   Procedure: ENDOSCOPIC RETROGRADE CHOLANGIOPANCREATOGRAPHY (ERCP);  Surgeon: Rogene Houston, MD;  [facility];  Service: Endoscopy;  Laterality: N/A;  . LOWER EXTREMITY ANGIOGRAPHY N/A 07/24/2018   Procedure: LOWER EXTREMITY ANGIOGRAPHY;  Surgeon: Marty Heck, MD;  Location: [place] CV LAB;  Service: Cardiovascular;  Laterality: N/A;  . PERIPHERAL VASCULAR INTERVENTION  07/24/2018   Procedure: PERIPHERAL VASCULAR INTERVENTION;  Surgeon: Marty Heck, MD;  Location: [place] CV LAB;  Service: Cardiovascular;;  . PORTACATH PLACEMENT Left 05/21/2020   Procedure: INSERTION PORT-A-CATH;  Surgeon: Aviva Signs, MD;  Location: [facility];  Service: General;  Laterality: Left;  . SPHINCTEROTOMY  06/11/2020   Procedure: SHORT SPHINCTEROTOMY;  Surgeon: Rogene Houston, MD;  [facility];  Service:  Endoscopy;;  . TONSILLECTOMY    . TRANSMETATARSAL AMPUTATION Left 07/25/2018   Procedure: LEFT GREAT TOE AMPUTATION;  Surgeon: Marty Heck, MD;  Location: [place];  Service: Vascular;  Laterality: Left;  . TRANSMETATARSAL AMPUTATION Left 07/30/2018   Procedure: TRANSMETATARSAL AMPUTATION;  Surgeon: Waynetta Sandy, MD;  Location: [place];  Service: Vascular;  Laterality: Left;    I have reviewed the social history and family history with the patient and they are unchanged from previous note.  ALLERGIES:  is allergic to tea.  MEDICATIONS:  Current Outpatient Medications   Medication Sig Dispense Refill  . Accu-Chek Softclix Lancets lancets 1 each by Other route in the morning, at noon, in the evening, and at bedtime. Dx: E11.65    . acetaminophen (TYLENOL) 500 MG tablet Take 500-1,000 mg by mouth every 6 (six) hours as needed (for pain).     . bisacodyl (DULCOLAX) 5 MG EC tablet Take 5 mg by mouth daily as needed for moderate constipation.     . blood glucose meter kit and supplies KIT Dispense based on patient and insurance preference. Use up to four times daily as directed. (FOR ICD-9 250.00, 250.01). 1 each 0  . Blood Glucose Monitoring Suppl (ACCU-CHEK GUIDE ME) w/Device KIT 1 Piece by Does not apply route as directed. 1 kit 0  . dexamethasone (DECADRON) 10 MG/ML injection Inject 10 mg into the vein every 14 (fourteen) days. Dexamethasone 10mg  in NS 50 ml IVPB prior to chemotherapy administration     . fluorouracil CALGB 97026 in sodium chloride 0.9 % 150 mL Inject 2,400 mg/m2 into the vein over 48 hr.     . fosaprepitant (EMEND) 150 MG SOLR injection Inject 150 mg into the vein every 14 (fourteen) days. Prior to chemotherapy administration     . glucose blood (ACCU-CHEK GUIDE) test strip Test BG 4 x daily. DX E11.65 150 each 5  . insulin degludec (TRESIBA FLEXTOUCH) 100 UNIT/ML FlexTouch Pen Inject 25 Units into the skin at bedtime. 30 mL 2  . Insulin Pen Needle 32G X 4 MM MISC Use to inject insulin 100 each 1  . IRINOTECAN HCL IV Inject 280 mg/m2 into the vein every 14 (fourteen) days.     Marland Kitchen LEUCOVORIN CALCIUM IV Inject 400 mg/m2 into the vein every 14 (fourteen) days.     Marland Kitchen lipase/protease/amylase (CREON) 36000 UNITS CPEP capsule Take 1 capsule (36,000 Units total) by mouth 3 (three) times daily with meals AND 1 capsule (36,000 Units total) with snacks. 240 capsule 2  . metFORMIN (GLUCOPHAGE) 500 MG tablet Take 1,000 mg by mouth 2 (two) times daily.    . OXALIPLATIN IV Inject 85 mg/m2 into the vein every 14 (fourteen) days.     . palonosetron (ALOXI) 0.25  MG/5ML injection Inject 0.25 mg into the vein every 14 (fourteen) days. Prior to chemotherapy administration     . potassium chloride SA (KLOR-CON) 20 MEQ tablet Take 1 tablet (20 mEq total) by mouth 2 (two) times daily. 8 tablet 0  . tamsulosin (FLOMAX) 0.4 MG CAPS capsule Take 1 capsule (0.4 mg total) by mouth at bedtime. 30 capsule 2  . lidocaine-prilocaine (EMLA) cream Apply a small amount to port a cath site and cover with plastic wrap 1 hour prior to chemotherapy appointments (Patient not taking: Reported on 09/07/2020) 30 g 3  . oxyCODONE (ROXICODONE) 5 MG immediate release tablet Take 2 tablets (10 mg total) by mouth every 6 (six) hours as needed for moderate pain or severe  pain. 45 tablet 0  . prochlorperazine (COMPAZINE) 10 MG tablet Take 1 tablet (10 mg total) by mouth every 6 (six) hours as needed for nausea or vomiting. (Patient not taking: Reported on 09/07/2020) 30 tablet 2   No current facility-administered medications for this visit.   Facility-Administered Medications Ordered in Other Visits  Medication Dose Route Frequency Provider Last Rate Last Admin  . atropine injection 0.5 mg  0.5 mg Intravenous Once PRN Derek Jack, MD      . dexamethasone (DECADRON) 10 mg in sodium chloride 0.9 % 50 mL IVPB  10 mg Intravenous Once Derek Jack, MD      . dextrose 5 % solution   Intravenous Once Derek Jack, MD      . dextrose 5 % solution   Intravenous Once Derek Jack, MD      . fluorouracil (ADRUCIL) 4,400 mg in sodium chloride 0.9 % 62 mL chemo infusion  2,400 mg/m2 (Treatment Plan Recorded) Intravenous 1 day or 1 dose Derek Jack, MD      . fosaprepitant (EMEND) 150 mg in sodium chloride 0.9 % 145 mL IVPB  150 mg Intravenous Once Derek Jack, MD      . irinotecan (CAMPTOSAR) 280 mg in sodium chloride 0.9 % 500 mL chemo infusion  150 mg/m2 (Treatment Plan Recorded) Intravenous Once Derek Jack, MD      . leucovorin 732 mg in  sodium chloride 0.9 % 250 mL infusion  400 mg/m2 (Treatment Plan Recorded) Intravenous Once Derek Jack, MD      . oxaliplatin (ELOXATIN) 90 mg in dextrose 5 % 500 mL chemo infusion  50 mg/m2 (Treatment Plan Recorded) Intravenous Once Truitt Merle, MD      . palonosetron (ALOXI) injection 0.25 mg  0.25 mg Intravenous Once Derek Jack, MD      . sodium chloride flush (NS) 0.9 % injection 10 mL  10 mL Intracatheter PRN Derek Jack, MD        PHYSICAL EXAMINATION: ECOG PERFORMANCE STATUS: 1 - Symptomatic but completely ambulatory  Vitals:   09/07/20 0802  BP: 128/70  Pulse: 98  Resp: 17  Temp: (!) 97.4 F (36.3 C)  SpO2: 99%   Filed Weights   09/07/20 0802  Weight: 129 lb 6.4 oz (58.7 kg)    GENERAL:alert, no distress and comfortable SKIN: skin color, texture, turgor are normal, no rashes or significant lesions EYES: normal, Conjunctiva are pink and non-injected, sclera clear NECK: supple, thyroid normal size, non-tender, without nodularity LYMPH:  no palpable lymphadenopathy in the cervical, axillary  LUNGS: clear to auscultation and percussion with normal breathing effort HEART: regular rate & rhythm and no murmurs and no lower extremity edema ABDOMEN:abdomen soft, non-tender and normal bowel sounds Musculoskeletal:no cyanosis of digits and no clubbing  NEURO: alert & oriented x 3 with fluent speech, no focal motor/sensory deficits  LABORATORY DATA:  I have reviewed the data as listed CBC Latest Ref Rng & Units 09/07/2020 08/24/2020 08/04/2020  WBC 4.0 - 10.5 K/uL 8.0 5.7 12.6(H)  Hemoglobin 13.0 - 17.0 g/dL 11.0(L) 11.2(L) 10.7(L)  Hematocrit 39.0 - 52.0 % 34.8(L) 34.8(L) 33.3(L)  Platelets 150 - 400 K/uL 150 141(L) 159     CMP Latest Ref Rng & Units 09/07/2020 08/24/2020 08/04/2020  Glucose 70 - 99 mg/dL 529(HH) 254(H) 123(H)  BUN 8 - 23 mg/dL 9 9 -  Creatinine 0.61 - 1.24 mg/dL 0.65 0.55(L) -  Sodium 135 - 145 mmol/L 132(L) 139 -  Potassium  3.5 - 5.1 mmol/L 3.5  3.1(L) -  Chloride 98 - 111 mmol/L 99 104 -  CO2 22 - 32 mmol/L 27 27 -  Calcium 8.9 - 10.3 mg/dL 8.6(L) 8.8(L) -  Total Protein 6.5 - 8.1 g/dL 6.6 6.5 -  Total Bilirubin 0.3 - 1.2 mg/dL 0.5 0.7 -  Alkaline Phos 38 - 126 U/L 297(H) 348(H) -  AST 15 - 41 U/L 31 43(H) -  ALT 0 - 44 U/L 25 40 -      RADIOGRAPHIC STUDIES: I have personally reviewed the radiological images as listed and agreed with the findings in the report. No results found.   ASSESSMENT & PLAN:  John Olliff Sr. is a 69 y.o. male with    1. Metastatic pancreatic adenocarcinoma to the liver: --Initial presentation with 2 to 3-week history of abdominal pain, loss of appetite and taste. -CT AP with contrast on 04/24/2020 shows 2.2 cm pancreatic head mass, small hypodense lesions throughout the bilateral lower lobes, predominantly in the right lower lobe, largest measuring 1.1 cm. 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 Mildly prominent lymph nodes within the upper abdomen above the pancreas head suspicious for metastatic lymphadenopathy. 5 mm pleural-based right lower lobe nodule. -CA 19-9 is 12. -Liver biopsy on 05/03/2020 consistent with metastatic adenocarcinoma, positive for CK7, CK20, CDX2. Differential diagnosis includes primary upper GI versus pancreaticobiliary. -PET scan on 05/19/2020 shows FDG avid had a pancreatic mass, multifocal low-attenuation lesions scattered throughout both lobes of the liver compatible with biopsy-proven liver metastasis. No FDG avid lymph nodes in the abdomen. No evidence of metastasis in the chest or bones. 2 small lung nodules within the right lower lobe measuring less than 5 mm. -ERCP and Wallstent placement on 06/11/2020. -Given metastatic pancreatic cancer is not curable, he was started on palliative chemo with FOLFIRINOX & Aloxi every 2 weeks beginning 06/23/20. He has completed 5 cycles so far. Plan to  repeat PET CT scan after cycle 6  -He is tolerating chemotherapy moderately well, with some neuropathy, oxaliplatin dose has been slightly decreased.  Will drop to 50 mg/m today.  Lab reviewed, adequate for treatment, will proceed cycle 6 chemo today. -He is scheduled for restaging PET scan on September 13, 2020   2. Abdominal pain: -Likely secondary to malignancy  -Pain has improved since start of chemotherapy.  He is requiring oxycodone  $RemoveBe'5mg'[id number]$  2 tablets/day.  Some days he does not require any pain medication. -I refilled his oxycodone 5 mg total 45 tablets today for 2-week supply -I again discussed narcotics precaution, it is a controlled substance, and we will not refill early if he loses it    3. Poorly controlled diabetes: -Hemoglobin A1c was 10.8 on 04/24/20. Has improved to 9.9 on 08/02/20.  -Continue Tresiba, per pt, he is on 30u tiwce daily.  Continue follow-up with Dr. Dorris Fetch. -His blood glucose is 529 today, will give regular insulin 16 units before chemo, and repeat blood glucose in 2 hours to see if he needs more.  4. Right kidney mass: Suspicious for RCC. Due to his metastatic pancreatic cancer, further work up was not pursued.  5. Genetic testing was negative for pathogenetic mutations.  6. Hypokalemia: On oral K, but not with consistent use.  7. Urinary frequency: Continue Flomax 0.4 mg at bedtime.   PLAN:  -Lab reviewed, adequate for treatment, will decrease oxaliplatin dose to 50 mg/m due to his neuropathy and recent fall, will consider start oxaliplatin in near future if his restaging  scan shows good response to treatment -He is scheduled for restaging PET scan on September 13, 2020 -Lab, flush, follow-up and next cycle chemo in 2 weeks -I refilled his oxycodone for 2 weeks supply -We will give regular insulin 16 units before chemo today, and monitor his blood glucose in 2 hours to see if he needs more    No problem-specific Assessment & Plan notes found for this  encounter.   No orders of the defined types were placed in this encounter.  All questions were answered. The patient knows to call the clinic with any problems, questions or concerns. No barriers to learning was detected. The total time spent in the appointment was 30 minutes.     Truitt Merle, MD 09/07/2020   I, Joslyn Devon, am acting as scribe for Truitt Merle, MD.   I have reviewed the above documentation for accuracy and completeness, and I agree with the above.

## 2020-09-07 ENCOUNTER — Other Ambulatory Visit: Payer: Self-pay

## 2020-09-07 ENCOUNTER — Encounter (HOSPITAL_COMMUNITY): Payer: Self-pay | Admitting: Hematology

## 2020-09-07 ENCOUNTER — Inpatient Hospital Stay (HOSPITAL_COMMUNITY): Payer: Medicare Other

## 2020-09-07 ENCOUNTER — Inpatient Hospital Stay (HOSPITAL_BASED_OUTPATIENT_CLINIC_OR_DEPARTMENT_OTHER): Payer: Medicare Other | Admitting: Hematology

## 2020-09-07 VITALS — BP 128/70 | HR 98 | Temp 97.4°F | Resp 17 | Wt 129.4 lb

## 2020-09-07 VITALS — BP 126/80 | HR 88 | Temp 97.4°F | Resp 18

## 2020-09-07 DIAGNOSIS — R739 Hyperglycemia, unspecified: Secondary | ICD-10-CM | POA: Diagnosis not present

## 2020-09-07 DIAGNOSIS — Z95828 Presence of other vascular implants and grafts: Secondary | ICD-10-CM

## 2020-09-07 DIAGNOSIS — E1065 Type 1 diabetes mellitus with hyperglycemia: Secondary | ICD-10-CM

## 2020-09-07 DIAGNOSIS — C259 Malignant neoplasm of pancreas, unspecified: Secondary | ICD-10-CM

## 2020-09-07 DIAGNOSIS — C787 Secondary malignant neoplasm of liver and intrahepatic bile duct: Secondary | ICD-10-CM

## 2020-09-07 DIAGNOSIS — E162 Hypoglycemia, unspecified: Secondary | ICD-10-CM

## 2020-09-07 DIAGNOSIS — E1110 Type 2 diabetes mellitus with ketoacidosis without coma: Secondary | ICD-10-CM | POA: Diagnosis not present

## 2020-09-07 LAB — GLUCOSE, RANDOM: Glucose, Bld: 326 mg/dL — ABNORMAL HIGH (ref 70–99)

## 2020-09-07 LAB — MAGNESIUM: Magnesium: 1.8 mg/dL (ref 1.7–2.4)

## 2020-09-07 LAB — COMPREHENSIVE METABOLIC PANEL
ALT: 25 U/L (ref 0–44)
AST: 31 U/L (ref 15–41)
Albumin: 2.6 g/dL — ABNORMAL LOW (ref 3.5–5.0)
Alkaline Phosphatase: 297 U/L — ABNORMAL HIGH (ref 38–126)
Anion gap: 6 (ref 5–15)
BUN: 9 mg/dL (ref 8–23)
CO2: 27 mmol/L (ref 22–32)
Calcium: 8.6 mg/dL — ABNORMAL LOW (ref 8.9–10.3)
Chloride: 99 mmol/L (ref 98–111)
Creatinine, Ser: 0.65 mg/dL (ref 0.61–1.24)
GFR, Estimated: >60 mL/min (ref >60)
Glucose, Bld: 529 mg/dL (ref 70–99)
Potassium: 3.5 mmol/L (ref 3.5–5.1)
Sodium: 132 mmol/L — ABNORMAL LOW (ref 135–145)
Total Bilirubin: 0.5 mg/dL (ref 0.3–1.2)
Total Protein: 6.6 g/dL (ref 6.5–8.1)

## 2020-09-07 LAB — CBC WITH DIFFERENTIAL/PLATELET
Abs Immature Granulocytes: 0.02 10*3/uL (ref 0.00–0.07)
Basophils Absolute: 0.1 10*3/uL (ref 0.0–0.1)
Basophils Relative: 1 %
Eosinophils Absolute: 0.3 10*3/uL (ref 0.0–0.5)
Eosinophils Relative: 3 %
HCT: 34.8 % — ABNORMAL LOW (ref 39.0–52.0)
Hemoglobin: 11 g/dL — ABNORMAL LOW (ref 13.0–17.0)
Immature Granulocytes: 0 %
Lymphocytes Relative: 17 %
Lymphs Abs: 1.3 10*3/uL (ref 0.7–4.0)
MCH: 29.7 pg (ref 26.0–34.0)
MCHC: 31.6 g/dL (ref 30.0–36.0)
MCV: 94.1 fL (ref 80.0–100.0)
Monocytes Absolute: 0.8 10*3/uL (ref 0.1–1.0)
Monocytes Relative: 10 %
Neutro Abs: 5.5 10*3/uL (ref 1.7–7.7)
Neutrophils Relative %: 69 %
Platelets: 150 10*3/uL (ref 150–400)
RBC: 3.7 MIL/uL — ABNORMAL LOW (ref 4.22–5.81)
RDW: 18.2 % — ABNORMAL HIGH (ref 11.5–15.5)
WBC: 8 10*3/uL (ref 4.0–10.5)
nRBC: 0 % (ref 0.0–0.2)

## 2020-09-07 MED ORDER — SODIUM CHLORIDE 0.9% FLUSH
10.0000 mL | INTRAVENOUS | Status: DC | PRN
  Administered 2020-09-07: 08:00:00 10 mL

## 2020-09-07 MED ORDER — OXALIPLATIN CHEMO INJECTION 100 MG/20ML
50.0000 mg/m2 | Freq: Once | INTRAVENOUS | Status: AC
  Administered 2020-09-07: 10:00:00 90 mg via INTRAVENOUS
  Filled 2020-09-07: qty 18

## 2020-09-07 MED ORDER — SODIUM CHLORIDE 0.9 % IV SOLN
150.0000 mg/m2 | Freq: Once | INTRAVENOUS | Status: AC
  Administered 2020-09-07: 13:00:00 280 mg via INTRAVENOUS
  Filled 2020-09-07: qty 10

## 2020-09-07 MED ORDER — DEXTROSE 5 % IV SOLN: Freq: Once | INTRAVENOUS | Status: AC

## 2020-09-07 MED ORDER — SODIUM CHLORIDE 0.9 % IV SOLN
150.0000 mg | Freq: Once | INTRAVENOUS | Status: AC
  Administered 2020-09-07: 09:00:00 150 mg via INTRAVENOUS
  Filled 2020-09-07: qty 60

## 2020-09-07 MED ORDER — SODIUM CHLORIDE 0.9 % IV SOLN
2400.0000 mg/m2 | INTRAVENOUS | Status: AC
  Administered 2020-09-07: 15:00:00 4400 mg via INTRAVENOUS
  Filled 2020-09-07: qty 88

## 2020-09-07 MED ORDER — OXYCODONE HCL 5 MG PO TABS: 10.0000 mg | ORAL_TABLET | Freq: Four times a day (QID) | ORAL | 0 refills | Status: DC | PRN

## 2020-09-07 MED ORDER — INSULIN ASPART 100 UNIT/ML ~~LOC~~ SOLN
16.0000 [IU] | Freq: Once | SUBCUTANEOUS | Status: AC
  Administered 2020-09-07: 09:00:00 16 [IU] via SUBCUTANEOUS
  Filled 2020-09-07: qty 0.16

## 2020-09-07 MED ORDER — SODIUM CHLORIDE 0.9 % IV SOLN
10.0000 mg | Freq: Once | INTRAVENOUS | Status: AC
  Administered 2020-09-07: 09:00:00 10 mg via INTRAVENOUS
  Filled 2020-09-07: qty 10

## 2020-09-07 MED ORDER — SODIUM CHLORIDE 0.9 % IV SOLN
382.0000 mg/m2 | Freq: Once | INTRAVENOUS | Status: AC
  Administered 2020-09-07: 13:00:00 700 mg via INTRAVENOUS
  Filled 2020-09-07: qty 35

## 2020-09-07 MED ORDER — INSULIN ASPART 100 UNIT/ML ~~LOC~~ SOLN
8.0000 [IU] | Freq: Once | SUBCUTANEOUS | Status: AC
  Administered 2020-09-07: 13:00:00 8 [IU] via SUBCUTANEOUS
  Filled 2020-09-07: qty 0.08

## 2020-09-07 MED ORDER — PALONOSETRON HCL INJECTION 0.25 MG/5ML
0.2500 mg | Freq: Once | INTRAVENOUS | Status: AC
  Administered 2020-09-07: 10:00:00 0.25 mg via INTRAVENOUS
  Filled 2020-09-07: qty 5

## 2020-09-07 MED ORDER — ATROPINE SULFATE 1 MG/ML IJ SOLN
0.5000 mg | Freq: Once | INTRAMUSCULAR | Status: AC | PRN
  Administered 2020-09-07: 13:00:00 0.5 mg via INTRAVENOUS
  Filled 2020-09-07 (×2): qty 1

## 2020-09-07 NOTE — Progress Notes (Signed)
CRITICAL VALUE ALERT  Critical Value:  Glucose 529  Date & Time Notied:  09/07/2020 at 0842  Provider Notified: Dr. Burr Medico  Orders Received/Actions taken: give Novolog 16 units Anthony x 1 dose and repeat blood glucose 2 hours after administration.

## 2020-09-07 NOTE — Progress Notes (Signed)
Ok to treat today with verbal order Regular Insulin 16 units and repeat blood glucose in 2 hours verbal order Dr. Burr Medico.   Repeat blood glucose 326 for two hour recheck.  Oncologist notified with verbal order Novolog insulin 8 units verbal order Dr. Burr Medico.    Patient tolerated chemotherapy with no complaints voiced.  Side effects with management reviewed with understanding verbalized.  Port site clean and dry with no bruising or swelling noted at site.  Good blood return noted before and after administration of chemotherapy.  Chemotherapy pump connected with no alarms noted.  Dressing intact.   Patient left in satisfactory condition with VSS and no s/s of distress noted.

## 2020-09-08 ENCOUNTER — Telehealth: Payer: Self-pay

## 2020-09-08 ENCOUNTER — Other Ambulatory Visit: Payer: Self-pay

## 2020-09-08 ENCOUNTER — Inpatient Hospital Stay (HOSPITAL_COMMUNITY)
Admission: EM | Admit: 2020-09-08 | Discharge: 2020-09-11 | DRG: 638 | Disposition: A | Discharge status: Home / Self Care | Payer: Medicare Other | Attending: Family Medicine | Admitting: Family Medicine

## 2020-09-08 ENCOUNTER — Encounter: Payer: Self-pay | Admitting: General Practice

## 2020-09-08 ENCOUNTER — Emergency Department (HOSPITAL_COMMUNITY): Payer: Medicare Other

## 2020-09-08 ENCOUNTER — Encounter (HOSPITAL_COMMUNITY): Payer: Self-pay | Admitting: Emergency Medicine

## 2020-09-08 DIAGNOSIS — I10 Essential (primary) hypertension: Secondary | ICD-10-CM | POA: Diagnosis not present

## 2020-09-08 DIAGNOSIS — E1151 Type 2 diabetes mellitus with diabetic peripheral angiopathy without gangrene: Secondary | ICD-10-CM | POA: Diagnosis present

## 2020-09-08 DIAGNOSIS — K8681 Exocrine pancreatic insufficiency: Secondary | ICD-10-CM | POA: Diagnosis not present

## 2020-09-08 DIAGNOSIS — Z7984 Long term (current) use of oral hypoglycemic drugs: Secondary | ICD-10-CM

## 2020-09-08 DIAGNOSIS — E1110 Type 2 diabetes mellitus with ketoacidosis without coma: Principal | ICD-10-CM | POA: Diagnosis present

## 2020-09-08 DIAGNOSIS — R739 Hyperglycemia, unspecified: Secondary | ICD-10-CM | POA: Diagnosis not present

## 2020-09-08 DIAGNOSIS — S81802A Unspecified open wound, left lower leg, initial encounter: Secondary | ICD-10-CM

## 2020-09-08 DIAGNOSIS — C787 Secondary malignant neoplasm of liver and intrahepatic bile duct: Secondary | ICD-10-CM | POA: Diagnosis present

## 2020-09-08 DIAGNOSIS — I739 Peripheral vascular disease, unspecified: Secondary | ICD-10-CM | POA: Diagnosis present

## 2020-09-08 DIAGNOSIS — N401 Enlarged prostate with lower urinary tract symptoms: Secondary | ICD-10-CM | POA: Diagnosis present

## 2020-09-08 DIAGNOSIS — F17210 Nicotine dependence, cigarettes, uncomplicated: Secondary | ICD-10-CM | POA: Diagnosis present

## 2020-09-08 DIAGNOSIS — S91309A Unspecified open wound, unspecified foot, initial encounter: Secondary | ICD-10-CM

## 2020-09-08 DIAGNOSIS — Z91018 Allergy to other foods: Secondary | ICD-10-CM

## 2020-09-08 DIAGNOSIS — E1142 Type 2 diabetes mellitus with diabetic polyneuropathy: Secondary | ICD-10-CM | POA: Diagnosis present

## 2020-09-08 DIAGNOSIS — Z79899 Other long term (current) drug therapy: Secondary | ICD-10-CM

## 2020-09-08 DIAGNOSIS — C259 Malignant neoplasm of pancreas, unspecified: Secondary | ICD-10-CM | POA: Diagnosis present

## 2020-09-08 DIAGNOSIS — Z794 Long term (current) use of insulin: Secondary | ICD-10-CM

## 2020-09-08 DIAGNOSIS — Z9119 Patient's noncompliance with other medical treatment and regimen: Secondary | ICD-10-CM

## 2020-09-08 DIAGNOSIS — E11621 Type 2 diabetes mellitus with foot ulcer: Secondary | ICD-10-CM | POA: Diagnosis present

## 2020-09-08 DIAGNOSIS — Z20822 Contact with and (suspected) exposure to covid-19: Secondary | ICD-10-CM | POA: Diagnosis present

## 2020-09-08 DIAGNOSIS — Z89512 Acquired absence of left leg below knee: Secondary | ICD-10-CM | POA: Diagnosis present

## 2020-09-08 HISTORY — DX: Malignant (primary) neoplasm, unspecified: C80.1

## 2020-09-08 LAB — CBC WITH DIFFERENTIAL/PLATELET
Abs Immature Granulocytes: 0.04 10*3/uL (ref 0.00–0.07)
Basophils Absolute: 0 10*3/uL (ref 0.0–0.1)
Basophils Relative: 1 %
Eosinophils Absolute: 0.3 10*3/uL (ref 0.0–0.5)
Eosinophils Relative: 3 %
HCT: 34.9 % — ABNORMAL LOW (ref 39.0–52.0)
Hemoglobin: 11.3 g/dL — ABNORMAL LOW (ref 13.0–17.0)
Immature Granulocytes: 1 %
Lymphocytes Relative: 15 %
Lymphs Abs: 1.2 10*3/uL (ref 0.7–4.0)
MCH: 30.4 pg (ref 26.0–34.0)
MCHC: 32.4 g/dL (ref 30.0–36.0)
MCV: 93.8 fL (ref 80.0–100.0)
Monocytes Absolute: 0.5 10*3/uL (ref 0.1–1.0)
Monocytes Relative: 6 %
Neutro Abs: 5.8 10*3/uL (ref 1.7–7.7)
Neutrophils Relative %: 74 %
Platelets: 166 10*3/uL (ref 150–400)
RBC: 3.72 MIL/uL — ABNORMAL LOW (ref 4.22–5.81)
RDW: 18.2 % — ABNORMAL HIGH (ref 11.5–15.5)
WBC: 7.8 10*3/uL (ref 4.0–10.5)
nRBC: 0 % (ref 0.0–0.2)

## 2020-09-08 LAB — URINALYSIS, ROUTINE W REFLEX MICROSCOPIC
Bacteria, UA: NONE SEEN
Bilirubin Urine: NEGATIVE
Glucose, UA: >=500 mg/dL — AB
Hgb urine dipstick: NEGATIVE
Ketones, ur: NEGATIVE mg/dL
Leukocytes,Ua: NEGATIVE
Nitrite: NEGATIVE
Protein, ur: NEGATIVE mg/dL
Specific Gravity, Urine: 1.028 (ref 1.005–1.030)
pH: 6 (ref 5.0–8.0)

## 2020-09-08 LAB — COMPREHENSIVE METABOLIC PANEL
ALT: 34 U/L (ref 0–44)
AST: 68 U/L — ABNORMAL HIGH (ref 15–41)
Albumin: 2.8 g/dL — ABNORMAL LOW (ref 3.5–5.0)
Alkaline Phosphatase: 309 U/L — ABNORMAL HIGH (ref 38–126)
Anion gap: 9 (ref 5–15)
BUN: 14 mg/dL (ref 8–23)
CO2: 25 mmol/L (ref 22–32)
Calcium: 9 mg/dL (ref 8.9–10.3)
Chloride: 99 mmol/L (ref 98–111)
Creatinine, Ser: 0.7 mg/dL (ref 0.61–1.24)
GFR, Estimated: >60 mL/min (ref >60)
Glucose, Bld: 610 mg/dL (ref 70–99)
Potassium: 4.1 mmol/L (ref 3.5–5.1)
Sodium: 133 mmol/L — ABNORMAL LOW (ref 135–145)
Total Bilirubin: 0.5 mg/dL (ref 0.3–1.2)
Total Protein: 7 g/dL (ref 6.5–8.1)

## 2020-09-08 LAB — BLOOD GAS, VENOUS
Acid-Base Excess: 1.9 mmol/L (ref 0.0–2.0)
Bicarbonate: 24.9 mmol/L (ref 20.0–28.0)
FIO2: 21
O2 Saturation: 54.5 %
Patient temperature: 36.9
pCO2, Ven: 46.1 mmHg (ref 44.0–60.0)
pH, Ven: 7.378 (ref 7.250–7.430)
pO2, Ven: 34.8 mmHg (ref 32.0–45.0)

## 2020-09-08 LAB — CBG MONITORING, ED
Glucose-Capillary: 280 mg/dL — ABNORMAL HIGH (ref 70–99)
Glucose-Capillary: 386 mg/dL — ABNORMAL HIGH (ref 70–99)
Glucose-Capillary: >600 mg/dL (ref 70–99)

## 2020-09-08 LAB — CANCER ANTIGEN 19-9: CA 19-9: 140 U/mL — ABNORMAL HIGH (ref 0–35)

## 2020-09-08 LAB — RESP PANEL BY RT-PCR (FLU A&B, COVID) ARPGX2
Influenza A by PCR: NEGATIVE
Influenza B by PCR: NEGATIVE
SARS Coronavirus 2 by RT PCR: NEGATIVE

## 2020-09-08 LAB — MAGNESIUM: Magnesium: 2 mg/dL (ref 1.7–2.4)

## 2020-09-08 MED ORDER — INSULIN REGULAR(HUMAN) IN NACL 100-0.9 UT/100ML-% IV SOLN
INTRAVENOUS | Status: DC
  Administered 2020-09-08: 22:00:00 6 [IU]/h via INTRAVENOUS
  Filled 2020-09-08: qty 100

## 2020-09-08 MED ORDER — ENOXAPARIN SODIUM 40 MG/0.4ML ~~LOC~~ SOLN
40.0000 mg | SUBCUTANEOUS | Status: DC
  Administered 2020-09-08 – 2020-09-10 (×3): 40 mg via SUBCUTANEOUS
  Filled 2020-09-08 (×3): qty 0.4

## 2020-09-08 MED ORDER — SODIUM CHLORIDE 0.9 % IV BOLUS
1000.0000 mL | Freq: Once | INTRAVENOUS | Status: AC
  Administered 2020-09-08: 22:00:00 1000 mL via INTRAVENOUS

## 2020-09-08 MED ORDER — PANCRELIPASE (LIP-PROT-AMYL) 12000-38000 UNITS PO CPEP
36000.0000 [IU] | ORAL_CAPSULE | Freq: Three times a day (TID) | ORAL | Status: DC
  Administered 2020-09-09 – 2020-09-11 (×8): 36000 [IU] via ORAL
  Filled 2020-09-08 (×4): qty 3
  Filled 2020-09-08: qty 1
  Filled 2020-09-08: qty 3
  Filled 2020-09-08 (×4): qty 1

## 2020-09-08 MED ORDER — LACTATED RINGERS IV SOLN: INTRAVENOUS | Status: DC

## 2020-09-08 MED ORDER — INSULIN ASPART PROT & ASPART (70-30 MIX) 100 UNIT/ML ~~LOC~~ SUSP: 10.0000 [IU] | Freq: Once | SUBCUTANEOUS | Status: DC

## 2020-09-08 MED ORDER — SODIUM CHLORIDE 0.9 % IV SOLN
1.0000 g | Freq: Once | INTRAVENOUS | Status: AC
  Administered 2020-09-08: 20:00:00 1 g via INTRAVENOUS
  Filled 2020-09-08: qty 10

## 2020-09-08 MED ORDER — SODIUM CHLORIDE 0.9 % IV BOLUS
500.0000 mL | Freq: Once | INTRAVENOUS | Status: AC
  Administered 2020-09-08: 500 mL via INTRAVENOUS

## 2020-09-08 MED ORDER — DEXTROSE 50 % IV SOLN
0.0000 mL | INTRAVENOUS | Status: DC | PRN
Start: 2020-09-08 — End: 2020-09-11
  Filled 2020-09-08: qty 50

## 2020-09-08 MED ORDER — TAMSULOSIN HCL 0.4 MG PO CAPS
0.4000 mg | ORAL_CAPSULE | Freq: Every day | ORAL | Status: DC
  Administered 2020-09-08 – 2020-09-10 (×3): 0.4 mg via ORAL
  Filled 2020-09-08 (×3): qty 1

## 2020-09-08 NOTE — ED Notes (Signed)
Date and time results received: 09/08/20 7:15 PM   Test: blood glucose Critical Value: 610  Name of Provider Notified: Dr. Laverta Baltimore  Orders Received? Or Actions Taken?: see orders

## 2020-09-08 NOTE — Telephone Encounter (Signed)
Thank you :)

## 2020-09-08 NOTE — Telephone Encounter (Signed)
Pt wife called and stated his toe for the past few days looks like it's turning black. It's starting to bother him. She wants to know if he needs to be seen, be referred, or what you advise them do?

## 2020-09-08 NOTE — Progress Notes (Signed)
[facility] CSW Progress Notes  Call to patient/wife at request of Jene Every, RN, to discuss issues with his diabetes management and diabetic supplies.  Per wife, he has all the supplies he needs to test, has insulin pens to give insulin.  She feels like he is not testing his blood sugar regularly "because he does not like to poke himself."  States that [facility]/Elena is coming to the house to observe him test his blood sugar - "he does it when she is there, then stops when she gone."  Wife states "he is a full grown man, I cant make him do something."  Wife has also noted a "black spot on his toe on his good leg."  Says this is similar to what led to amputation of his other leg, she is concerned that this spot may need medical attention.  Provided her with numbers for his endocrinologist and PCP so this can be addressed.  [facility] - he is on their list to contact regularly.  They last saw him on 07/30/20 - they have provided him w supplies, diabetic cookbooks w easy recipes, and significant education.  They have tried to reach him by phone/text/going to the door - they have found that he is very difficult to reach. He does receive Meals on Wheels.  Per [facility], the living conditions are poor, no stove in the home, broken windows. They will do what they can to reach patient and assist/engage with blood sugar control  However, "he has to open the door and answer the phone in order for [place] to help him."  They will reach out again to try to help him as best they can.  Edwyna Shell, LCSW Clinical Social Worker Phone:  [phone] Cell:  [phone]

## 2020-09-08 NOTE — ED Triage Notes (Addendum)
Pt states he has a blister on his rt big toe for a couple weeks. Pt has DM. Pt has a CBG over 600.

## 2020-09-08 NOTE — Telephone Encounter (Signed)
Pt wife informed. They are going now.

## 2020-09-08 NOTE — TOC Initial Note (Signed)
Transition of Care (TOC) - Initial/Assessment Note    Patient Details  Name: John Lozito Sr. MRN: [id number] Date of Birth: Aug 25, 1951  Transition of Care [facility]) CM/SW Contact:    Iona Beard, [place] Phone Number: 09/08/2020, 10:30 PM  Clinical Narrative:                 Pt admitted due to hyperglycemia. TOC consulted due to HH/DME needs and possible poor living conditions. CSW visited pt in ED to complete assessment. Pt states he lives with his wife. Pt is independent with ADLs. Pts does not drive, his wife provides his transportation. Pt states that he has had [employer] before but is unsure of the company. Pt states that he has a cane, walker, and a lifted toilet seat. Pt states that he is in need of a new back for his wheelchair [phone]). CSW informed pt that TOC will f/u with DME company to see if they can supply the back.   CSW inquired about pts medication compliance. Pt states he is "horrible" with taking medications as he should. CSW attempted to educate pt on importance of taking all medications. Pt states that he part of meals on wheels. Pt states that he does not think he is part of the [facility] because they do not come to his home anymore. TOC to follow.   Expected Discharge Plan: Home/Self Care Barriers to Discharge: Continued Medical Work up   Patient Goals and CMS Choice Patient states their goals for this hospitalization and ongoing recovery are:: Return home   Choice offered to / list presented to : NA  Expected Discharge Plan and Services Expected Discharge Plan: Home/Self Care In-house Referral: Clinical Social Work Discharge Planning Services: CM Consult Post Acute Care Choice: Durable Medical Equipment Living arrangements for the past 2 months: Single Family Home                                      Prior Living Arrangements/Services Living arrangements for the past 2 months: Single Family Home Lives with:: Spouse Patient  language and need for interpreter reviewed:: Yes Do you feel safe going back to the place where you live?: Yes      Need for Family Participation in Patient Care: No (Comment) Care giver support system in place?: Yes (comment) Current home services: DME Kasandra Knudsen) Criminal Activity/Legal Involvement Pertinent to Current Situation/Hospitalization: No - Comment as needed  Activities of Daily Living      Permission Sought/Granted                  Emotional Assessment Appearance:: Appears stated age Attitude/Demeanor/Rapport: Engaged Affect (typically observed): Accepting Orientation: : Oriented to Self,Oriented to Place,Oriented to  Time,Oriented to Situation Alcohol / Substance Use: Not Applicable Psych Involvement: No (comment)  Admission diagnosis:  Hyperglycemia [R73.9] Patient Active Problem List   Diagnosis Date Noted  . Mouth sore secondary to chemotherapy 07/28/2020  . 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 Genetic testing 06/30/2020  . Dehydration 06/09/2020  . Port-A-Cath in place 05/25/2020  . Pancreatic cancer metastasized to liver ([place])   . Goals of care, counseling/discussion 05/11/2020  . Family history of cervical cancer   . Pain of upper abdomen   . Hyperglycemia   . Pancreatic mass 04/24/2020  . Renal mass 04/24/2020  . Type 2 diabetes mellitus with peripheral  neuropathy ([place])   . Essential hypertension   . Left below-knee amputee ([place]) 08/05/2018  . Dyslipidemia   . Current smoker   . PAD (peripheral artery disease) ([place]) 07/20/2018   PCP:  Perlie Mayo, NP Pharmacy:   [facility] [address] [place] [id number] [place] [address] Phone: [phone] Fax: [phone]     Social Determinants of Health (SDOH) Interventions    Readmission Risk Interventions No flowsheet data found.

## 2020-09-08 NOTE — ED Provider Notes (Signed)
[facility] EMERGENCY DEPARTMENT Provider Note   CSN: [id number] Arrival date & time: 09/08/20  1709     History No chief complaint on file.   John Berrie Sr. is a 69 y.o. male.  HPI   Patient with significant medical history of pancreatic cancer currently on chemotherapy, diabetes type 2, heart murmur, hypertension presents to the emergency department with chief complaint of lesion on right big toe.  Patient states he bumped it a few months ago and the wound has gotten worse.  He states he has pain in his right big toe, hurts when he walks on it or applies any weight to it, he has been trying to take care of it but has been unable to manage it.  He also noted that he has an ulcer on his left leg, he is an amputee above the knee and wears a prosthetic leg, the prosthetic is rubbing against the lateral aspect of his thigh causing a abrasion.  He states he notices a ulcer few to get few days ago and started to get worse.  He endorses that he has been trying to take his diabetes medication as prescribed but does not know how to properly take it.  He endorses that he is been more thirsty, frequent urination, but denies abdominal pain, nausea, vomiting, diarrhea, difficulty with urination.    After reviewing patient's notes he has pancreatic cancer diagnosed in 07/31, currently on chemotherapy, fifth cycle.  He has poorly managed diabetes, last A1c was 9.9, he is currently on Tresiba which he takes 30 units twice a day.  Case management has tried to reach the patient, appears that he has a poor support system and has difficulty taking care of himself.  Past Medical History:  Diagnosis Date  . Acute blood loss anemia   . Arthritis   . Benign essential HTN   . Cancer [facility])    pancreatic  . Cellulitis 07/20/2018  . Cellulitis in diabetic foot ([place]) 07/20/2018  . Diabetes mellitus without complication ([place])   . Diabetes mellitus, type II ([place]) 07/20/2018  . Dry gangrene ([place]) 07/20/2018  .  Emesis   . Family history of cervical cancer   . Heart murmur   . Hyperglycemia   . Hypertension   . Joint pain   . Port-A-Cath in place 05/25/2020  . Unilateral complete BKA, left, initial encounter [facility])     Patient Active Problem List   Diagnosis Date Noted  . Mouth sore secondary to chemotherapy 07/28/2020  . 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 Genetic testing 06/30/2020  . Dehydration 06/09/2020  . Port-A-Cath in place 05/25/2020  . Pancreatic cancer metastasized to liver ([place])   . Goals of care, counseling/discussion 05/11/2020  . Family history of cervical cancer   . Pain of upper abdomen   . Hyperglycemia   . Pancreatic mass 04/24/2020  . Renal mass 04/24/2020  . Type 2 diabetes mellitus with peripheral neuropathy ([facility])   . Essential hypertension   . Left below-knee amputee ([place]) 08/05/2018  . Dyslipidemia   . Current smoker   . PAD (peripheral artery disease) ([place]) 07/20/2018    Past Surgical History:  Procedure Laterality Date  . AMPUTATION Left 08/02/2018   Procedure: LEFT AMPUTATION BELOW KNEE;  Surgeon: Marty Heck, MD;  Location: [facility];  Service: Vascular;  Laterality: Left;  . BILIARY STENT PLACEMENT N/A 06/11/2020   Procedure: BILIARY STENT PLACEMENT;  Surgeon: Rogene Houston,  MD;  [facility];  Service: Endoscopy;  Laterality: N/A;  . ELBOW SURGERY     Bone graft to repair elbow  . ERCP N/A 06/11/2020   Procedure: ENDOSCOPIC RETROGRADE CHOLANGIOPANCREATOGRAPHY (ERCP);  Surgeon: Rogene Houston, MD;  [facility];  Service: Endoscopy;  Laterality: N/A;  . LOWER EXTREMITY ANGIOGRAPHY N/A 07/24/2018   Procedure: LOWER EXTREMITY ANGIOGRAPHY;  Surgeon: Marty Heck, MD;  Location: [place] CV LAB;  Service: Cardiovascular;  Laterality: N/A;  . PERIPHERAL VASCULAR INTERVENTION  07/24/2018   Procedure: PERIPHERAL VASCULAR INTERVENTION;  Surgeon:  Marty Heck, MD;  Location: [place] CV LAB;  Service: Cardiovascular;;  . PORTACATH PLACEMENT Left 05/21/2020   Procedure: INSERTION PORT-A-CATH;  Surgeon: Aviva Signs, MD;  Location: [facility] ORS;  Service: General;  Laterality: Left;  . SPHINCTEROTOMY  06/11/2020   Procedure: SHORT SPHINCTEROTOMY;  Surgeon: Rogene Houston, MD;  [facility];  Service: Endoscopy;;  . TONSILLECTOMY    . TRANSMETATARSAL AMPUTATION Left 07/25/2018   Procedure: LEFT GREAT TOE AMPUTATION;  Surgeon: Marty Heck, MD;  Location: [facility];  Service: Vascular;  Laterality: Left;  . TRANSMETATARSAL AMPUTATION Left 07/30/2018   Procedure: TRANSMETATARSAL AMPUTATION;  Surgeon: Waynetta Sandy, MD;  [facility];  Service: Vascular;  Laterality: Left;       Family History  Problem Relation Age of Onset  . Alcoholism Mother   . Alcoholism Father   . Cervical cancer Sister        dx. late 47s  . Cervical cancer Sister        dx. late 82s    Social History   Tobacco Use  . Smoking status: Current Every Day Smoker    Packs/day: 0.25    Types: Cigarettes  . Smokeless tobacco: Former Systems developer    Types: Snuff    Quit date: 04/21/1989  Vaping Use  . Vaping Use: Never used  Substance Use Topics  . Alcohol use: Not Currently    Alcohol/week: 0.0 standard drinks    Comment: stopped 2 years ago  . Drug use: No    Home Medications Prior to Admission medications   Medication Sig Start Date End Date Taking? Authorizing Provider  Accu-Chek Softclix Lancets lancets 1 each by Other route in the morning, at noon, in the evening, and at bedtime. Dx: E11.65    [provider]  acetaminophen (TYLENOL) 500 MG tablet Take 500-1,000 mg by mouth every 6 (six) hours as needed (for pain).     [provider]  bisacodyl (DULCOLAX) 5 MG EC tablet Take 5 mg by mouth daily as needed for moderate constipation.     [provider]  blood glucose meter kit and supplies KIT  Dispense based on patient and insurance preference. Use up to four times daily as directed. (FOR ICD-9 250.00, 250.01). 07/08/20   Derek Jack, MD  Blood Glucose Monitoring Suppl (ACCU-CHEK GUIDE ME) w/Device KIT 1 Piece by Does not apply route as directed. 07/22/20   Cassandria Anger, MD  dexamethasone (DECADRON) 10 MG/ML injection Inject 10 mg into the vein every 14 (fourteen) days. Dexamethasone 42m in NS 50 ml IVPB prior to chemotherapy administration  05/26/20   [provider]  fluorouracil CALGB 885277in sodium chloride 0.9 % 150 mL Inject 2,400 mg/m2 into the vein over 48 hr.  05/26/20   [provider]  fosaprepitant (EMEND) 150 MG SOLR injection Inject 150 mg into the vein every 14 (fourteen) days.  Prior to chemotherapy administration  05/26/20   [provider]  glucose blood (ACCU-CHEK GUIDE) test strip Test BG 4 x daily. DX E11.65 07/22/20   Cassandria Anger, MD  insulin degludec (TRESIBA FLEXTOUCH) 100 UNIT/ML FlexTouch Pen Inject 25 Units into the skin at bedtime. 08/02/20   Brita Romp, NP  Insulin Pen Needle 32G X 4 MM MISC Use to inject insulin 08/02/20   Brita Romp, NP  IRINOTECAN HCL IV Inject 280 mg/m2 into the vein every 14 (fourteen) days.  05/26/20   [provider]  LEUCOVORIN CALCIUM IV Inject 400 mg/m2 into the vein every 14 (fourteen) days.  05/26/20   [provider]  lidocaine-prilocaine (EMLA) cream Apply a small amount to port a cath site and cover with plastic wrap 1 hour prior to chemotherapy appointments Patient not taking: Reported on 09/07/2020 05/25/20   Derek Jack, MD  lipase/protease/amylase (CREON) 36000 UNITS CPEP capsule Take 1 capsule (36,000 Units total) by mouth 3 (three) times daily with meals AND 1 capsule (36,000 Units total) with snacks. 07/22/20   Cassandria Anger, MD  metFORMIN (GLUCOPHAGE) 500 MG tablet Take 1,000 mg by mouth 2 (two) times daily. 07/22/20   [provider]  OXALIPLATIN IV Inject 85 mg/m2 into the vein every 14 (fourteen) days.  05/26/20   [provider]  oxyCODONE (ROXICODONE) 5 MG immediate release tablet Take 2 tablets (10 mg total) by mouth every 6 (six) hours as needed for moderate pain or severe pain. 09/07/20   Truitt Merle, MD  palonosetron (ALOXI) 0.25 MG/5ML injection Inject 0.25 mg into the vein every 14 (fourteen) days. Prior to chemotherapy administration  05/26/20   [provider]  potassium chloride SA (KLOR-CON) 20 MEQ tablet Take 1 tablet (20 mEq total) by mouth 2 (two) times daily. 07/30/20   Jacquelin Hawking, NP  prochlorperazine (COMPAZINE) 10 MG tablet Take 1 tablet (10 mg total) by mouth every 6 (six) hours as needed for nausea or vomiting. Patient not taking: Reported on 09/07/2020 06/23/20   Derek Jack, MD  tamsulosin (FLOMAX) 0.4 MG CAPS capsule Take 1 capsule (0.4 mg total) by mouth at bedtime. 07/30/20   Jacquelin Hawking, NP    Allergies    Tea  Review of Systems   Review of Systems  Constitutional: Negative for chills and fever.  HENT: Negative for congestion.   Respiratory: Negative for cough and shortness of breath.   Cardiovascular: Negative for chest pain.  Gastrointestinal: Negative for abdominal pain, diarrhea, nausea and vomiting.  Genitourinary: Positive for frequency. Negative for dysuria, enuresis and flank pain.  Musculoskeletal: Negative for back pain.  Skin: Positive for wound. Negative for rash.  Neurological: Negative for dizziness and headaches.  Hematological: Does not bruise/bleed easily.    Physical Exam Updated Vital Signs BP (!) 145/79 (BP Location: Right Arm)   Pulse 93   Temp 98.5 F (36.9 C) (Oral)   Resp 18   Ht _0  (1.803 m)   Wt 62.1 kg   SpO2 100%   BMI 19.11 kg/m   Physical Exam Vitals and nursing note reviewed.  Constitutional:      General: He is not in acute distress.    Appearance: He is ill-appearing.  HENT:     Head:  Normocephalic and atraumatic.     Nose: No congestion.     Mouth/Throat:     Mouth: Mucous membranes are moist.     Pharynx: Oropharynx is clear. No oropharyngeal exudate  or posterior oropharyngeal erythema.  Eyes:     Conjunctiva/sclera: Conjunctivae normal.  Cardiovascular:     Rate and Rhythm: Normal rate and regular rhythm.     Pulses: Normal pulses.     Heart sounds: No murmur heard. No friction rub. No gallop.   Pulmonary:     Effort: No respiratory distress.     Breath sounds: No wheezing, rhonchi or rales.  Abdominal:     Palpations: Abdomen is soft.     Tenderness: There is no abdominal tenderness.  Musculoskeletal:     Right lower leg: No edema.     Left lower leg: No edema.     Comments: Patient moving all 4 extremities at difficulty.  Skin:    General: Skin is warm and dry.     Findings: Rash present.     Comments: Patient has a noted port in his left upper pectoral muscle.  No signs of infection noted.  Patient is right first digit has a noted ulcer at the distal end, there is no surrounding erythema or edema noted, no drainage or discharge present.  Area was tender to palpation, no fluctuant indurations felt.  Neurovascularly intact.  Patient's left leg amputated above the knee has a noted ulcer on the lateral aspect at the distal end.  There is surrounding erythema, edema, tender to palpation, no fluctuance indurations present.  Neurological:     Mental Status: He is alert.  Psychiatric:        Mood and Affect: Mood normal.           ED Results / Procedures / Treatments   Labs (all labs ordered are listed, but only abnormal results are displayed) Labs Reviewed  COMPREHENSIVE METABOLIC PANEL - Abnormal; Notable for the following components:      Result Value   Sodium 133 (*)    Glucose, Bld 610 (*)    Albumin 2.8 (*)    AST 68 (*)    Alkaline Phosphatase 309 (*)    All other components within normal limits  CBC WITH DIFFERENTIAL/PLATELET -  Abnormal; Notable for the following components:   RBC 3.72 (*)    Hemoglobin 11.3 (*)    HCT 34.9 (*)    RDW 18.2 (*)    All other components within normal limits  URINALYSIS, ROUTINE W REFLEX MICROSCOPIC - Abnormal; Notable for the following components:   Glucose, UA >=500 (*)    All other components within normal limits  CBG MONITORING, ED - Abnormal; Notable for the following components:   Glucose-Capillary >600 (*)    All other components within normal limits  CBG MONITORING, ED - Abnormal; Notable for the following components:   Glucose-Capillary 386 (*)    All other components within normal limits  RESP PANEL BY RT-PCR (FLU A&B, COVID) ARPGX2  MAGNESIUM  BLOOD GAS, VENOUS  BASIC METABOLIC PANEL  BASIC METABOLIC PANEL    EKG None  Radiology DG Foot Complete Right  Result Date: 09/08/2020 CLINICAL DATA:  Diabetic foot. Right foot pain with wound about the great toe. EXAM: RIGHT FOOT COMPLETE - 3+ VIEW COMPARISON:  None. FINDINGS: No abnormal bone density, bony destruction or erosion to suggest osteomyelitis. Normal alignment. Osteoarthritis of the first metatarsal phalangeal joint. Advanced vascular calcifications. No soft tissue air or radiopaque foreign body. Site of wound is not well delineated by radiograph. IMPRESSION: 1. No radiographic evidence of osteomyelitis. 2. Osteoarthritis of the first metatarsophalangeal joint. 3. Advanced vascular calcifications. Electronically Signed   By: Threasa Beards  [place] M.D.   On: 09/08/2020 19:05    Procedures Procedures (including critical care time)  Medications Ordered in ED Medications  sodium chloride 0.9 % bolus 1,000 mL (has no administration in time range)  lipase/protease/amylase (CREON) capsule 36,000 Units (has no administration in time range)  tamsulosin (FLOMAX) capsule 0.4 mg (has no administration in time range)  enoxaparin (LOVENOX) injection 40 mg (has no administration in time range)  insulin regular, human  (MYXREDLIN) 100 units/ 100 mL infusion (has no administration in time range)  lactated ringers infusion (has no administration in time range)  dextrose 50 % solution 0-50 mL (has no administration in time range)  sodium chloride 0.9 % bolus 500 mL (has no administration in time range)  cefTRIAXone (ROCEPHIN) 1 g in sodium chloride 0.9 % 100 mL IVPB (0 g Intravenous Stopped 09/08/20 2048)    ED Course  I have reviewed the triage vital signs and the nursing notes.  Pertinent labs & imaging results that were available during my care of the patient were reviewed by me and considered in my medical decision making (see chart for details).    MDM Rules/Calculators/A&P                          Patient presents to the emergency department with chief complaint of diabetic wounds.  He is alert, does not appear in acute distress, vital signs reassuring.  Will obtain basic lab work, x-ray and reevaluate.  Will start patient on antibiotics for wounds and start him on insulin for hyperglycemia  Due to significantly elevated glucose as well as new diabetic ulcers in the setting of immunocompromise and poorly controlled diabetes will consult with hospitalist for admission.  Spoke with Dr. Su Ley who agrees patient should be admitted for further observation.  She will come down and evaluate the patient.  CBC shows no leukocytosis, shows normocytic anemia appears to be a baseline for patient.  CMP hyponatremia of 962, no metabolic acidosis, hyperglycemia 610, I hypoalbuminemia 2.8, slightly elevated AST, no anion gap present.  Magnesium 2, respiratory panel negative for influenza, a/B, Covid.  UA shows no nitrates, leukocytes, or hematuria.  X-ray of foot shows no radio graphic evidence of osteomyelitis, shows OA of the first metatarsal phalangeal joints  I have low suspicion for DKA or HHS as there is no metabolic acidosis noted, no ketones noted in urine, patient is nontachypneic, tolerating p.o. without  difficulty.  Low  suspicion for systemic infection as patient is nontoxic-appearing, vital signs reassuring, no elevation in white blood cells.  Low suspicion for osteomyelitis of the right big toe as x-rays are negative, no surrounding erythema or edema noted on exam.  I suspect patient's hyperglycemia secondary due to  noncompliance as patient states he has not been taking his medications as prescribed.  I anticipate patient will need further glucose control as well as IV antibiotics for new diabetic ulcers.  Patient care to be transferred to admitting team for further evaluation. Final Clinical Impression(s) / ED Diagnoses Final diagnoses:  Hyperglycemia  Wound of left lower extremity, initial encounter  Wound of foot    Rx / DC Orders ED Discharge Orders    None       Marcello Fennel, PA-C 09/08/20 2203    Margette Fast, MD 09/14/20 [phone]

## 2020-09-08 NOTE — Telephone Encounter (Signed)
ED for assessment ASAP

## 2020-09-08 NOTE — H&P (Addendum)
History and Physical    John Langlois Sr. ONG:[id number] DOB: 1951/01/09 DOA: 09/08/2020  PCP: Perlie Mayo, NP   Patient coming from: Home  I have personally briefly reviewed patient's old medical records in [place]  Chief Complaint: Right big toe ulcer  HPI: John Dimmick Sr. is a 69 y.o. male with medical history significant for pancreatic mass with liver metastasis, diabetes mellitus, left BKA, hypertension. Patient ED with complaints of a blister on his right big toe, he has some pain, and patient spouse is worried that it was getting black.  He denies fevers or chills.  No vomiting or change in bowel habits.   Patient is supposed to be on tresiba-30 units twice daily.  He tells me he routinely forgets to take his insulin.  He has not taken his insulin in 3 days.  He tells me he does not have a problem with pricking his fingers or injecting himself. Per Education officer, museum notes today, patient receives Meals on Wheels, and per integrated health program, patient's living conditions are poor, he has no stove at home, with broken windows.   On my evaluation - patient denies poor living situation at home, he denies his house having broken windows.  ED Course: Stable vitals.  Blood glucose 610.  Anion gap 9, serum bicarb 25.  Venous pH 7.3.  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  IV ceftriaxone 1 g given. Hospitalist to admit for hyperglycemia and possible cellulitis.  Review of Systems: As per HPI all other systems reviewed and negative.  Past Medical History:  Diagnosis Date  . Acute blood loss anemia   . Arthritis   . Benign essential HTN   . Cancer [facility])    pancreatic  . Cellulitis 07/20/2018  . Cellulitis in diabetic foot ([place]) 07/20/2018  . Diabetes mellitus without complication ([place])   . Diabetes mellitus, type II ([place]) 07/20/2018  . Dry gangrene ([place]) 07/20/2018  . Emesis   . Family history  of cervical cancer   . Heart murmur   . Hyperglycemia   . Hypertension   . Joint pain   . Port-A-Cath in place 05/25/2020  . Unilateral complete BKA, left, initial encounter [facility])     Past Surgical History:  Procedure Laterality Date  . AMPUTATION Left 08/02/2018   Procedure: LEFT AMPUTATION BELOW KNEE;  Surgeon: Marty Heck, MD;  Location: [place];  Service: Vascular;  Laterality: Left;  . BILIARY STENT PLACEMENT N/A 06/11/2020   Procedure: BILIARY STENT PLACEMENT;  Surgeon: Rogene Houston, MD;  [facility];  Service: Endoscopy;  Laterality: N/A;  . ELBOW SURGERY     Bone graft to repair elbow  . ERCP N/A 06/11/2020   Procedure: ENDOSCOPIC RETROGRADE CHOLANGIOPANCREATOGRAPHY (ERCP);  Surgeon: Rogene Houston, MD;  [facility];  Service: Endoscopy;  Laterality: N/A;  . LOWER EXTREMITY ANGIOGRAPHY N/A 07/24/2018   Procedure: LOWER EXTREMITY ANGIOGRAPHY;  Surgeon: Marty Heck, MD;  Location: [place] CV LAB;  Service: Cardiovascular;  Laterality: N/A;  . PERIPHERAL VASCULAR INTERVENTION  07/24/2018   Procedure: PERIPHERAL VASCULAR INTERVENTION;  Surgeon: Marty Heck, MD;  Location: [place] CV LAB;  Service: Cardiovascular;;  . PORTACATH PLACEMENT Left 05/21/2020   Procedure: INSERTION PORT-A-CATH;  Surgeon: Aviva Signs, MD;  Location: [facility];  Service: General;  Laterality: Left;  . SPHINCTEROTOMY  06/11/2020   Procedure: SHORT SPHINCTEROTOMY;  Surgeon: Rogene Houston, MD;  [facility];  Service: Endoscopy;;  . TONSILLECTOMY    . TRANSMETATARSAL AMPUTATION Left 07/25/2018   Procedure: LEFT GREAT TOE AMPUTATION;  Surgeon: Marty Heck, MD;  Location: [place];  Service: Vascular;  Laterality: Left;  . TRANSMETATARSAL AMPUTATION Left 07/30/2018   Procedure: TRANSMETATARSAL AMPUTATION;  Surgeon: Waynetta Sandy, MD;  Location: [place];  Service: Vascular;  Laterality: Left;     reports that he has been  smoking cigarettes. He has been smoking about 0.25 packs per day. He quit smokeless tobacco use about 31 years ago.  His smokeless tobacco use included snuff. He reports previous alcohol use. He reports that he does not use drugs.  Allergies  Allergen Reactions  . Tea Itching and Rash    Family History  Problem Relation Age of Onset  . Alcoholism Mother   . Alcoholism Father   . Cervical cancer Sister        dx. late 15s  . Cervical cancer Sister        dx. late 35s    Prior to Admission medications   Medication Sig Start Date End Date Taking? Authorizing Provider  Accu-Chek Softclix Lancets lancets 1 each by Other route in the morning, at noon, in the evening, and at bedtime. Dx: E11.65    [provider]  acetaminophen (TYLENOL) 500 MG tablet Take 500-1,000 mg by mouth every 6 (six) hours as needed (for pain).     [provider]  bisacodyl (DULCOLAX) 5 MG EC tablet Take 5 mg by mouth daily as needed for moderate constipation.     [provider]  blood glucose meter kit and supplies KIT Dispense based on patient and insurance preference. Use up to four times daily as directed. (FOR ICD-9 250.00, 250.01). 07/08/20   Derek Jack, MD  Blood Glucose Monitoring Suppl (ACCU-CHEK GUIDE ME) w/Device KIT 1 Piece by Does not apply route as directed. 07/22/20   Cassandria Anger, MD  dexamethasone (DECADRON) 10 MG/ML injection Inject 10 mg into the vein every 14 (fourteen) days. Dexamethasone 10mg  in NS 50 ml IVPB prior to chemotherapy administration  05/26/20   [provider]  fluorouracil CALGB 51025 in sodium chloride 0.9 % 150 mL Inject 2,400 mg/m2 into the vein over 48 hr.  05/26/20   [provider]  fosaprepitant (EMEND) 150 MG SOLR injection Inject 150 mg into the vein every 14 (fourteen) days. Prior to chemotherapy administration  05/26/20   [provider]  glucose blood (ACCU-CHEK GUIDE) test strip Test BG 4 x daily. DX  E11.65 07/22/20   Cassandria Anger, MD  insulin degludec (TRESIBA FLEXTOUCH) 100 UNIT/ML FlexTouch Pen Inject 25 Units into the skin at bedtime. 08/02/20   Brita Romp, NP  Insulin Pen Needle 32G X 4 MM MISC Use to inject insulin 08/02/20   Brita Romp, NP  IRINOTECAN HCL IV Inject 280 mg/m2 into the vein every 14 (fourteen) days.  05/26/20   [provider]  LEUCOVORIN CALCIUM IV Inject 400 mg/m2 into the vein every 14 (fourteen) days.  05/26/20   [provider]  lidocaine-prilocaine (EMLA) cream Apply a small amount to port a cath site and cover with plastic wrap 1 hour prior to chemotherapy appointments Patient not taking: Reported on 09/07/2020 05/25/20   Derek Jack, MD  lipase/protease/amylase (CREON) 36000 UNITS CPEP capsule Take 1 capsule (36,000 Units total) by mouth 3 (three) times daily with meals AND 1 capsule (36,000 Units total) with snacks. 07/22/20   Nida,  Marella Chimes, MD  metFORMIN (GLUCOPHAGE) 500 MG tablet Take 1,000 mg by mouth 2 (two) times daily. 07/22/20   [provider]  OXALIPLATIN IV Inject 85 mg/m2 into the vein every 14 (fourteen) days.  05/26/20   [provider]  oxyCODONE (ROXICODONE) 5 MG immediate release tablet Take 2 tablets (10 mg total) by mouth every 6 (six) hours as needed for moderate pain or severe pain. 09/07/20   Truitt Merle, MD  palonosetron (ALOXI) 0.25 MG/5ML injection Inject 0.25 mg into the vein every 14 (fourteen) days. Prior to chemotherapy administration  05/26/20   [provider]  potassium chloride SA (KLOR-CON) 20 MEQ tablet Take 1 tablet (20 mEq total) by mouth 2 (two) times daily. 07/30/20   Jacquelin Hawking, NP  prochlorperazine (COMPAZINE) 10 MG tablet Take 1 tablet (10 mg total) by mouth every 6 (six) hours as needed for nausea or vomiting. Patient not taking: Reported on 09/07/2020 06/23/20   Derek Jack, MD  tamsulosin (FLOMAX) 0.4 MG CAPS capsule Take 1 capsule (0.4  mg total) by mouth at bedtime. 07/30/20   Jacquelin Hawking, NP    Physical Exam: Vitals:   09/08/20 1738 09/08/20 1740  BP: (!) 145/79   Pulse: 93   Resp: 18   Temp: 98.5 F (36.9 C)   TempSrc: Oral   SpO2: 100%   Weight:  62.1 kg  Height:  $Remove'5\' 11"'[id number]$  (1.803 m)    Constitutional: Chronically ill-appearing,  calm comfortable Vitals:   09/08/20 1738 09/08/20 1740  BP: (!) 145/79   Pulse: 93   Resp: 18   Temp: 98.5 F (36.9 C)   TempSrc: Oral   SpO2: 100%   Weight:  62.1 kg  Height:  $Remove'5\' 11"'PZVENnK$  (1.803 m)   Eyes: PERRL, lids and conjunctivae normal ENMT: Mucous membranes are moist.  Neck: normal, supple, no masses, no thyromegaly.  Port-A-Cath left upper chest, currently receiving chemotherapy via continuous infusion. Respiratory: clear to auscultation bilaterally, no wheezing, no crackles. Normal respiratory effort. No accessory muscle use.  Cardiovascular: Regular rate and rhythm, no murmurs / rubs / gallops. No extremity edema. 2+ pedal pulses. No carotid bruits.  Abdomen: no tenderness, no masses palpated. No hepatosplenomegaly. Bowel sounds positive.  Musculoskeletal: no clubbing / cyanosis. No joint deformity upper and lower extremities. Good ROM, no contractures. Normal muscle tone.  Skin: 2 ulcers present.  1 to right big toe, scabbed over, no surrounding significant erythema, warmth or redness.  Mild tenderness to palpation.  Second ulcer circular, to lateral side of left lower extremity stump, appears to be healing, also without significant erythema tenderness or differential warmth. Neurologic: No apparent cranial abnormality, moving extremities spontaneously. Psychiatric: Normal judgment and insight. Alert and oriented x 3. Normal mood.            Labs on Admission: I have personally reviewed following labs and imaging studies  CBC: Recent Labs  Lab 09/07/20 0802 09/08/20 1816  WBC 8.0 7.8  NEUTROABS 5.5 5.8  HGB 11.0* 11.3*  HCT 34.8* 34.9*  MCV 94.1 93.8   PLT 150 267   Basic Metabolic Panel: Recent Labs  Lab 09/07/20 0802 09/07/20 1145 09/08/20 1816  NA 132*  --  133*  K 3.5  --  4.1  CL 99  --  99  CO2 27  --  25  GLUCOSE 529* 326* 610*  BUN 9  --  14  CREATININE 0.65  --  0.70  CALCIUM 8.6*  --  9.0  MG  1.8  --  2.0   Liver Function Tests: Recent Labs  Lab 09/07/20 0802 09/08/20 1816  AST 31 68*  ALT 25 34  ALKPHOS 297* 309*  BILITOT 0.5 0.5  PROT 6.6 7.0  ALBUMIN 2.6* 2.8*   CBG: Recent Labs  Lab 09/08/20 1743  GLUCAP >600*   Urine analysis:    Component Value Date/Time   COLORURINE YELLOW 09/08/2020 1750   APPEARANCEUR CLEAR 09/08/2020 1750   LABSPEC 1.028 09/08/2020 1750   PHURINE 6.0 09/08/2020 1750   GLUCOSEU >=500 (A) 09/08/2020 1750   HGBUR NEGATIVE 09/08/2020 1750   BILIRUBINUR NEGATIVE 09/08/2020 1750   KETONESUR NEGATIVE 09/08/2020 1750   PROTEINUR NEGATIVE 09/08/2020 1750   NITRITE NEGATIVE 09/08/2020 1750   LEUKOCYTESUR NEGATIVE 09/08/2020 1750    Radiological Exams on Admission: DG Foot Complete Right  Result Date: 09/08/2020 CLINICAL DATA:  Diabetic foot. Right foot pain with wound about the great toe. EXAM: RIGHT FOOT COMPLETE - 3+ VIEW COMPARISON:  None. FINDINGS: No abnormal bone density, bony destruction or erosion to suggest osteomyelitis. Normal alignment. Osteoarthritis of the first metatarsal phalangeal joint. Advanced vascular calcifications. No soft tissue air or radiopaque foreign body. Site of wound is not well delineated by radiograph. IMPRESSION: 1. No radiographic evidence of osteomyelitis. 2. Osteoarthritis of the first metatarsophalangeal joint. 3. Advanced vascular calcifications. Electronically Signed   By: Keith Rake M.D.   On: 09/08/2020 19:05    EKG: None.   Assessment/Plan Principal Problem:   Hyperglycemia Active Problems:   PAD (peripheral artery disease) (HCC)   Left below-knee amputee (HCC)   Type 2 diabetes mellitus with peripheral neuropathy (HCC)    Essential hypertension   Pancreatic cancer metastasized to liver [facility])   Exocrine pancreatic insufficiency   Hyperglycemia- blood glucose 610.  Anion gap of 9, serum bicarb 25.  Venous pH 7.3.  Hgba1c-9.9.  Likely due to noncompliance, last took his insulin 3 days ago, patient tells me he forgets to take his insulin. -Continue insulin drip -Monitor BMP closely - give 1.5 L, continue  RL 125cc/hr -Hold Metformin, home Tresiba 30 units twice daily  Right big toe ulcer, left BKA ulcer-big toe ulcer scabbed over, no drainage, both ulcers do not appear infected at this time.  Right foot x-ray negative for osteomyelitis.  History of peripheral artery disease -Needs close diabetic foot care -Consult wound care.  Left below-knee amputation  Hypertension- Stable.  Pancreatic cancer with liver metastasis-diagnosed 04/13/2020.  Follows with Dr. Delton Coombes.  He is currently on palliative chemotherapy.  Port-A-Cath present, currently receiving continuous chemotherapy infusion.  Pancreatic insufficiency -Resume Creon  ?  Poor living situation- - TOC consult   DVT prophylaxis: Lovenox Code Status: Full code Family Communication: None at bedside Disposition Plan:  ~ 1 - 2 days Consults called: None Admission status:  Obs, step down.   Bethena Roys MD [facility]  09/08/2020, 9:31 PM

## 2020-09-09 ENCOUNTER — Telehealth (HOSPITAL_COMMUNITY): Payer: Self-pay | Admitting: Hematology

## 2020-09-09 ENCOUNTER — Observation Stay (HOSPITAL_COMMUNITY): Payer: Medicare Other

## 2020-09-09 DIAGNOSIS — Z20822 Contact with and (suspected) exposure to covid-19: Secondary | ICD-10-CM | POA: Diagnosis not present

## 2020-09-09 DIAGNOSIS — Z794 Long term (current) use of insulin: Secondary | ICD-10-CM | POA: Diagnosis not present

## 2020-09-09 DIAGNOSIS — Z9119 Patient's noncompliance with other medical treatment and regimen: Secondary | ICD-10-CM | POA: Diagnosis not present

## 2020-09-09 DIAGNOSIS — R739 Hyperglycemia, unspecified: Secondary | ICD-10-CM | POA: Diagnosis present

## 2020-09-09 DIAGNOSIS — E11621 Type 2 diabetes mellitus with foot ulcer: Secondary | ICD-10-CM | POA: Diagnosis not present

## 2020-09-09 DIAGNOSIS — Z91018 Allergy to other foods: Secondary | ICD-10-CM | POA: Diagnosis not present

## 2020-09-09 DIAGNOSIS — C259 Malignant neoplasm of pancreas, unspecified: Secondary | ICD-10-CM | POA: Diagnosis not present

## 2020-09-09 DIAGNOSIS — E1110 Type 2 diabetes mellitus with ketoacidosis without coma: Secondary | ICD-10-CM | POA: Diagnosis not present

## 2020-09-09 DIAGNOSIS — I10 Essential (primary) hypertension: Secondary | ICD-10-CM | POA: Diagnosis not present

## 2020-09-09 DIAGNOSIS — N401 Enlarged prostate with lower urinary tract symptoms: Secondary | ICD-10-CM | POA: Diagnosis not present

## 2020-09-09 DIAGNOSIS — C787 Secondary malignant neoplasm of liver and intrahepatic bile duct: Secondary | ICD-10-CM | POA: Diagnosis not present

## 2020-09-09 DIAGNOSIS — F17210 Nicotine dependence, cigarettes, uncomplicated: Secondary | ICD-10-CM | POA: Diagnosis not present

## 2020-09-09 DIAGNOSIS — Z7984 Long term (current) use of oral hypoglycemic drugs: Secondary | ICD-10-CM | POA: Diagnosis not present

## 2020-09-09 DIAGNOSIS — Z79899 Other long term (current) drug therapy: Secondary | ICD-10-CM | POA: Diagnosis not present

## 2020-09-09 DIAGNOSIS — Z89512 Acquired absence of left leg below knee: Secondary | ICD-10-CM | POA: Diagnosis not present

## 2020-09-09 DIAGNOSIS — K8681 Exocrine pancreatic insufficiency: Secondary | ICD-10-CM | POA: Diagnosis not present

## 2020-09-09 DIAGNOSIS — E1151 Type 2 diabetes mellitus with diabetic peripheral angiopathy without gangrene: Secondary | ICD-10-CM | POA: Diagnosis not present

## 2020-09-09 LAB — BASIC METABOLIC PANEL
Anion gap: 7 (ref 5–15)
Anion gap: 5 (ref 5–15)
BUN: 10 mg/dL (ref 8–23)
BUN: 11 mg/dL (ref 8–23)
CO2: 27 mmol/L (ref 22–32)
CO2: 27 mmol/L (ref 22–32)
Calcium: 8.4 mg/dL — ABNORMAL LOW (ref 8.9–10.3)
Calcium: 8.1 mg/dL — ABNORMAL LOW (ref 8.9–10.3)
Chloride: 104 mmol/L (ref 98–111)
Chloride: 104 mmol/L (ref 98–111)
Creatinine, Ser: 0.49 mg/dL — ABNORMAL LOW (ref 0.61–1.24)
Creatinine, Ser: 0.46 mg/dL — ABNORMAL LOW (ref 0.61–1.24)
GFR, Estimated: >60 mL/min (ref >60)
GFR, Estimated: >60 mL/min (ref >60)
Glucose, Bld: 52 mg/dL — ABNORMAL LOW (ref 70–99)
Glucose, Bld: 110 mg/dL — ABNORMAL HIGH (ref 70–99)
Potassium: 3.2 mmol/L — ABNORMAL LOW (ref 3.5–5.1)
Potassium: 3.2 mmol/L — ABNORMAL LOW (ref 3.5–5.1)
Sodium: 138 mmol/L (ref 135–145)
Sodium: 136 mmol/L (ref 135–145)

## 2020-09-09 LAB — CBG MONITORING, ED
Glucose-Capillary: 110 mg/dL — ABNORMAL HIGH (ref 70–99)
Glucose-Capillary: 146 mg/dL — ABNORMAL HIGH (ref 70–99)
Glucose-Capillary: 161 mg/dL — ABNORMAL HIGH (ref 70–99)
Glucose-Capillary: 161 mg/dL — ABNORMAL HIGH (ref 70–99)
Glucose-Capillary: 205 mg/dL — ABNORMAL HIGH (ref 70–99)
Glucose-Capillary: 398 mg/dL — ABNORMAL HIGH (ref 70–99)
Glucose-Capillary: 74 mg/dL (ref 70–99)
Glucose-Capillary: 77 mg/dL (ref 70–99)
Glucose-Capillary: 78 mg/dL (ref 70–99)
Glucose-Capillary: 85 mg/dL (ref 70–99)

## 2020-09-09 MED ORDER — METFORMIN HCL 500 MG PO TABS
1000.0000 mg | ORAL_TABLET | Freq: Two times a day (BID) | ORAL | Status: DC
  Administered 2020-09-09 – 2020-09-11 (×3): 1000 mg via ORAL
  Filled 2020-09-09 (×3): qty 2

## 2020-09-09 MED ORDER — INSULIN ASPART 100 UNIT/ML ~~LOC~~ SOLN: 0.0000 [IU] | Freq: Every day | SUBCUTANEOUS | Status: DC

## 2020-09-09 MED ORDER — INSULIN ASPART 100 UNIT/ML ~~LOC~~ SOLN
2.0000 [IU] | Freq: Three times a day (TID) | SUBCUTANEOUS | Status: DC
  Administered 2020-09-10 – 2020-09-11 (×3): 2 [IU] via SUBCUTANEOUS
  Filled 2020-09-09: qty 1

## 2020-09-09 MED ORDER — INSULIN ASPART 100 UNIT/ML ~~LOC~~ SOLN
0.0000 [IU] | Freq: Three times a day (TID) | SUBCUTANEOUS | Status: DC
  Administered 2020-09-09 – 2020-09-10 (×3): 1 [IU] via SUBCUTANEOUS

## 2020-09-09 MED ORDER — INSULIN DETEMIR 100 UNIT/ML ~~LOC~~ SOLN
25.0000 [IU] | Freq: Every day | SUBCUTANEOUS | Status: DC
  Administered 2020-09-09 – 2020-09-11 (×3): 25 [IU] via SUBCUTANEOUS
  Filled 2020-09-09 (×4): qty 0.25

## 2020-09-09 MED ORDER — LACTATED RINGERS IV SOLN: INTRAVENOUS | Status: DC

## 2020-09-09 NOTE — Progress Notes (Signed)
PROGRESS NOTE   John Haueter Sr.  ZYS:[id number] DOB: [birth year] DOA: 09/08/2020 PCP: Perlie Mayo, NP  Brief Narrative:  69 year old  Former daily smoker daily drinker DM TY 2 Left lower limb critical ischemia was PAD status post amputation 07/2018 Prior falls with accidental rib fracture ?  Right kidney mass Recently diagnosed new onset pancreatic cancer metastatic to liver on FOLFIRINOX status post 5 cycles last visit with Dr. Morey Hummingbird 09/07/2020  Developed lesion on right big toe bumped it several months prior also developed ulcer on prosthetic leg Endorsing on review in ED polyuria polydipsia  Found to have blood glucose 610 anion gap 9 bicarb 25 started on insulin gtt. fluids home insulin Metformin held Wound care consulted  Assessment & Plan:   Principal Problem:   Hyperglycemia Active Problems:   PAD (peripheral artery disease) (HCC)   Left below-knee amputee (HCC)   Type 2 diabetes mellitus with peripheral neuropathy ([place])   Essential hypertension   Pancreatic cancer metastasized to liver [facility])   Exocrine pancreatic insufficiency   1. DKA in the setting of poorly controlled diabetes mellitus type 2 a. Transition to phase to assess rapidly closed gap and start diabetic diet b. Give Lantus 20 units now-usually uses 25 units at bedtime c. Reinforced with patient need for good glycemic control d. Continue LR 75 cc/h   e. Metformin 1000 twice daily restarted 2. Metastatic pancreatic cancer a. Continue palliative chemotherapy as an outpatient with Dr. Alfonzo Feller, palonosetron 5-FU Emend b. Holding for now and can resume as an outpatient c. Resumed Creon 36,000 3 times daily 3. Right greater toe ulcer a. Right great toe wound looks clean to my exam no need for antibiotics imaging negative for any osteo- 4. RCC 5. Left BKA secondary to PAD critical ischemia 6. Ulcer on the lateral side of BKA a. Both are stable at this time 7. HTN-not on  meds 8. BPH a. Continue Flomax 0.4 daily   DVT prophylaxis: Lovenox Code Status: Full Family Communication: None Disposition:  Status is: Observation  The patient remains OBS appropriate and will d/c before 2 midnights.  Dispo: The patient is from: Home              Anticipated d/c is to: Home              Anticipated d/c date is: 1 day              Patient currently is not medically stable to d/c.       Consultants:   None  Procedures: None  Antimicrobials: None   Subjective: Awake alert coherent tells me he has not eaten since admission No chest pain no fever no chills No pain at this time We had a long talk about compliance on medications  Objective: Vitals:   09/09/20 0430 09/09/20 0500 09/09/20 0530 09/09/20 0759  BP: 112/70 120/83 131/82 139/83  Pulse:    67  Resp:  19 15 13   Temp:      TempSrc:      SpO2:    98%  Weight:      Height:        Intake/Output Summary (Last 24 hours) at 09/09/2020 0804 Last data filed at 09/08/2020 2341 Gross per 24 hour  Intake 1100 ml  Output --  Net 1100 ml   Filed Weights   09/08/20 1740  Weight: 62.1 kg    Examination:  EOMI NCAT no focal deficit Frail cachectic X3-A3 holosystolic murmur LLSE ROM intact moving  4 limbs equally Abdomen soft no rebound 2 x 2 crusted  ischemic very tip of toe neuro intact  Data Reviewed: I have personally reviewed following labs and imaging studies Sodium 136 potassium 3.2 BUN/creatinine 11/0.4 Hemoglobin 11.3 white count 7.8 platelet 166  Radiology Studies: DG Foot Complete Right  Result Date: 09/08/2020 CLINICAL DATA:  Diabetic foot. Right foot pain with wound about the great toe. EXAM: RIGHT FOOT COMPLETE - 3+ VIEW COMPARISON:  None. FINDINGS: No abnormal bone density, bony destruction or erosion to suggest osteomyelitis. Normal alignment. Osteoarthritis of the first metatarsal phalangeal joint. Advanced vascular calcifications. No soft tissue air or radiopaque  foreign body. Site of wound is not well delineated by radiograph. IMPRESSION: 1. No radiographic evidence of osteomyelitis. 2. Osteoarthritis of the first metatarsophalangeal joint. 3. Advanced vascular calcifications. Electronically Signed   By: Keith Rake M.D.   On: 09/08/2020 19:05     Scheduled Meds: . enoxaparin (LOVENOX) injection  40 mg Subcutaneous Q24H  . lipase/protease/amylase  36,000 Units Oral TID WC  . tamsulosin  0.4 mg Oral QHS   Continuous Infusions: . insulin 0.7 Units/hr (09/09/20 0628)  . lactated ringers 125 mL/hr at 09/09/20 0042     LOS: 0 days    Time spent: [place], MD [facility] To contact the attending provider between 7A-7P or the covering provider during after hours 7P-7A, please log into the web site [email or web address] and access using universal Green password for that web site. If you do not have the password, please call the hospital operator.  09/09/2020, 8:04 AM

## 2020-09-09 NOTE — ED Notes (Signed)
Spoke with Joelene Millin at [facility] to set up transportation at this time.

## 2020-09-09 NOTE — Care Management Obs Status (Signed)
[place] NOTIFICATION   Patient Details  Name: John Ellis Sr. MRN: [id number] Date of Birth: Jan 06, 1951   Medicare Observation Status Notification Given:  Yes (mailed to [address], [place] [address] as requested)    Tommy Medal 09/09/2020, 3:27 PM

## 2020-09-09 NOTE — Telephone Encounter (Signed)
Called to speak with pts wife regarding the [employer]. Explained to her what is is and how much is awarded to our pts for a on time payout. Advised her I needed fin doc. She stated pt is currently inpatient and she would get the docs to me at a later date.

## 2020-09-09 NOTE — Progress Notes (Addendum)
Inpatient Diabetes Program Recommendations  AACE/ADA: New Consensus Statement on Inpatient Glycemic Control (2015)  Target Ranges:  Prepandial:   less than 140 mg/dL      Peak postprandial:   less than 180 mg/dL (1-2 hours)      Critically ill patients:  140 - 180 mg/dL   Lab Results  Component Value Date   GLUCAP 205 (H) 09/09/2020   HGBA1C 9.9 (A) 08/02/2020    Review of Glycemic Control Results for [name], [name] (MRN [id number]) as of 09/09/2020 09:36  Ref. Range 09/08/2020 21:47 09/08/2020 23:32 09/09/2020 00:33 09/09/2020 01:55 09/09/2020 02:12 09/09/2020 03:33 09/09/2020 05:33 09/09/2020 06:26 09/09/2020 09:21  Glucose-Capillary Latest Ref Range: 70 - 99 mg/dL 386 (H) 280 (H) 398 (H) 85 74 78 161 (H) 146 (H) 205 (H)   Diabetes history: DM 2 Outpatient Diabetes medications: Tresiba 25 units daily Current orders for Inpatient glycemic control:  Metformin 1000 mg bid Novolog 2 units tid with meals Levemir 25 units daily Novolog 0-6 units tid and HS  Inpatient Diabetes Program Recommendations:    Note patient was getting Decadron with chemo. This likely increased blood sugars? May actually need reduced amount of insulin when he is not getting steroids. Will try to f/u with patient's family today to make sure he has assistance with insulin, monitoring, etc.   Thanks,  Adah Perl, RN, BC-ADM Inpatient Diabetes Coordinator Pager [phone] (8a-5p)  Addendum: Attempted to call patient's wife and daughter to discuss home insulin use.  No answer x2.  Will re-attempt later this afternoon.

## 2020-09-09 NOTE — ED Notes (Signed)
Report given to Adela Ports, RN.

## 2020-09-09 NOTE — Progress Notes (Signed)
Patients port flushed without difficulty.  Good blood return noted with no bruising or swelling noted at site.  Band aid applied.  VSS with discharge and left in satisfactory condition with no s/s of distress noted.   

## 2020-09-09 NOTE — ED Notes (Signed)
Report given to Jahseh with [facility] 

## 2020-09-09 NOTE — ED Notes (Signed)
[facility] to bedside to pickup patient.

## 2020-09-10 DIAGNOSIS — Z20822 Contact with and (suspected) exposure to covid-19: Secondary | ICD-10-CM | POA: Diagnosis present

## 2020-09-10 DIAGNOSIS — Z794 Long term (current) use of insulin: Secondary | ICD-10-CM | POA: Diagnosis not present

## 2020-09-10 DIAGNOSIS — E11621 Type 2 diabetes mellitus with foot ulcer: Secondary | ICD-10-CM | POA: Diagnosis present

## 2020-09-10 DIAGNOSIS — Z79899 Other long term (current) drug therapy: Secondary | ICD-10-CM | POA: Diagnosis not present

## 2020-09-10 DIAGNOSIS — C787 Secondary malignant neoplasm of liver and intrahepatic bile duct: Secondary | ICD-10-CM | POA: Diagnosis present

## 2020-09-10 DIAGNOSIS — R739 Hyperglycemia, unspecified: Secondary | ICD-10-CM | POA: Diagnosis present

## 2020-09-10 DIAGNOSIS — Z7984 Long term (current) use of oral hypoglycemic drugs: Secondary | ICD-10-CM | POA: Diagnosis not present

## 2020-09-10 DIAGNOSIS — Z89512 Acquired absence of left leg below knee: Secondary | ICD-10-CM | POA: Diagnosis not present

## 2020-09-10 DIAGNOSIS — Z9119 Patient's noncompliance with other medical treatment and regimen: Secondary | ICD-10-CM | POA: Diagnosis not present

## 2020-09-10 DIAGNOSIS — F17210 Nicotine dependence, cigarettes, uncomplicated: Secondary | ICD-10-CM | POA: Diagnosis present

## 2020-09-10 DIAGNOSIS — E1151 Type 2 diabetes mellitus with diabetic peripheral angiopathy without gangrene: Secondary | ICD-10-CM | POA: Diagnosis present

## 2020-09-10 DIAGNOSIS — N401 Enlarged prostate with lower urinary tract symptoms: Secondary | ICD-10-CM | POA: Diagnosis present

## 2020-09-10 DIAGNOSIS — E1110 Type 2 diabetes mellitus with ketoacidosis without coma: Secondary | ICD-10-CM | POA: Diagnosis present

## 2020-09-10 DIAGNOSIS — I10 Essential (primary) hypertension: Secondary | ICD-10-CM | POA: Diagnosis present

## 2020-09-10 DIAGNOSIS — K8681 Exocrine pancreatic insufficiency: Secondary | ICD-10-CM | POA: Diagnosis present

## 2020-09-10 DIAGNOSIS — Z91018 Allergy to other foods: Secondary | ICD-10-CM | POA: Diagnosis not present

## 2020-09-10 DIAGNOSIS — C259 Malignant neoplasm of pancreas, unspecified: Secondary | ICD-10-CM | POA: Diagnosis present

## 2020-09-10 LAB — COMPREHENSIVE METABOLIC PANEL
ALT: 38 U/L (ref 0–44)
AST: 70 U/L — ABNORMAL HIGH (ref 15–41)
Albumin: 2.2 g/dL — ABNORMAL LOW (ref 3.5–5.0)
Alkaline Phosphatase: 231 U/L — ABNORMAL HIGH (ref 38–126)
Anion gap: 9 (ref 5–15)
BUN: 10 mg/dL (ref 8–23)
CO2: 27 mmol/L (ref 22–32)
Calcium: 8.3 mg/dL — ABNORMAL LOW (ref 8.9–10.3)
Chloride: 101 mmol/L (ref 98–111)
Creatinine, Ser: 0.59 mg/dL — ABNORMAL LOW (ref 0.61–1.24)
GFR, Estimated: >60 mL/min (ref >60)
Glucose, Bld: 57 mg/dL — ABNORMAL LOW (ref 70–99)
Potassium: 3.5 mmol/L (ref 3.5–5.1)
Sodium: 137 mmol/L (ref 135–145)
Total Bilirubin: 0.6 mg/dL (ref 0.3–1.2)
Total Protein: 5.5 g/dL — ABNORMAL LOW (ref 6.5–8.1)

## 2020-09-10 LAB — GLUCOSE, CAPILLARY
Glucose-Capillary: 199 mg/dL — ABNORMAL HIGH (ref 70–99)
Glucose-Capillary: 59 mg/dL — ABNORMAL LOW (ref 70–99)
Glucose-Capillary: 87 mg/dL (ref 70–99)
Glucose-Capillary: 200 mg/dL — ABNORMAL HIGH (ref 70–99)
Glucose-Capillary: 167 mg/dL — ABNORMAL HIGH (ref 70–99)
Glucose-Capillary: 74 mg/dL (ref 70–99)

## 2020-09-10 LAB — CBC WITH DIFFERENTIAL/PLATELET
Abs Immature Granulocytes: 0.02 10*3/uL (ref 0.00–0.07)
Basophils Absolute: 0 10*3/uL (ref 0.0–0.1)
Basophils Relative: 1 %
Eosinophils Absolute: 0.3 10*3/uL (ref 0.0–0.5)
Eosinophils Relative: 5 %
HCT: 31.3 % — ABNORMAL LOW (ref 39.0–52.0)
Hemoglobin: 9.8 g/dL — ABNORMAL LOW (ref 13.0–17.0)
Immature Granulocytes: 0 %
Lymphocytes Relative: 25 %
Lymphs Abs: 1.3 10*3/uL (ref 0.7–4.0)
MCH: 29.7 pg (ref 26.0–34.0)
MCHC: 31.3 g/dL (ref 30.0–36.0)
MCV: 94.8 fL (ref 80.0–100.0)
Monocytes Absolute: 0.2 10*3/uL (ref 0.1–1.0)
Monocytes Relative: 4 %
Neutro Abs: 3.4 10*3/uL (ref 1.7–7.7)
Neutrophils Relative %: 65 %
Platelets: 136 10*3/uL — ABNORMAL LOW (ref 150–400)
RBC: 3.3 MIL/uL — ABNORMAL LOW (ref 4.22–5.81)
RDW: 18.1 % — ABNORMAL HIGH (ref 11.5–15.5)
WBC: 5.2 10*3/uL (ref 4.0–10.5)
nRBC: 0 % (ref 0.0–0.2)

## 2020-09-10 MED ORDER — CHLORHEXIDINE GLUCONATE CLOTH 2 % EX PADS
6.0000 | MEDICATED_PAD | Freq: Every day | CUTANEOUS | Status: DC
  Administered 2020-09-10 – 2020-09-11 (×2): 6 via TOPICAL

## 2020-09-10 MED ORDER — POTASSIUM CHLORIDE 20 MEQ PO PACK
40.0000 meq | PACK | Freq: Once | ORAL | Status: AC
  Administered 2020-09-10: 11:00:00 40 meq via ORAL
  Filled 2020-09-10: qty 2

## 2020-09-10 NOTE — TOC Progression Note (Signed)
Transition of Care (TOC) - Progression Note    Patient Details  Name: John Lunz Sr. MRN: [id number] Date of Birth: 06/24/1951  Transition of [facility]) CM/SW Contact  Natasha Bence, LCSW Phone Number: 09/10/2020, 11:42 AM  Clinical Narrative:    CSw received notification of patient's request for new wheelchair backing. Barbaraann Rondo with [employer] reported they are not able to provide new wheel chair backing but is able to new wheel chair. CSW placed order for new wheelchair. Barbaraann Rondo agreeable to provide wheelchair and reported that it will be drop shipped to patient's home. TOC to follow.   Expected Discharge Plan: Home/Self Care Barriers to Discharge: Continued Medical Work up  Expected Discharge Plan and Services Expected Discharge Plan: Home/Self Care In-house Referral: Clinical Social Work Discharge Planning Services: CM Consult Post Acute Care Choice: Durable Medical Equipment Living arrangements for the past 2 months: Single Family Home                                       Social Determinants of Health (SDOH) Interventions    Readmission Risk Interventions No flowsheet data found.

## 2020-09-10 NOTE — Progress Notes (Addendum)
Inpatient Diabetes Program Recommendations  AACE/ADA: New Consensus Statement on Inpatient Glycemic Control (2015)  Target Ranges:  Prepandial:   less than 140 mg/dL      Peak postprandial:   less than 180 mg/dL (1-2 hours)      Critically ill patients:  140 - 180 mg/dL   Lab Results  Component Value Date   GLUCAP 59 (L) 09/10/2020   HGBA1C 9.9 (A) 08/02/2020    Review of Glycemic Control Results for [name], [name] (MRN [id number]) as of 09/10/2020 09:08  Ref. Range 09/09/2020 20:14 09/09/2020 21:15 09/10/2020 07:33  Glucose-Capillary Latest Ref Range: 70 - 99 mg/dL 110 (H) 199 (H) 59 (L)   Diabetes history: Type 2 DM Outpatient Diabetes medications: Tresiba 25 units QD, Metformin 1000 mg BID Current orders for Inpatient glycemic control: Levemir 25 units Qd, Novolog 2 units TID, Novolog 0-6 units TID, Novolog 0-5 units QHS, Metformin 1000 mg BID Patient receives Decadron 10 mg Q14 days outpatient with treatmetn  Inpatient Diabetes Program Recommendations:    Noted hypoglycemia this Am of 59 mg/dL, consider decreasing Levemir to 16 units QD and discontinuing Novolog 2 units TID.   Addendum: spoke with patient's daughter, [name], regarding insulin administration and monitoring when outpatient. [name] reports, "My dad is stubborn and will say he took the insulin when we know he didn't. He argues and gets agitated, especially when my mother tries to assist. However, because I have been in health care prior he will let me help him." Reviewed patient's current A1c of 9.9%. Explained what a A1c is and what it measures. Also reviewed goal A1c with patient, importance of good glucose control @ home, and blood sugar goals. Reviewed patho of DM, DKA, impact of steroids, need for insulin, hypo vs hyper glycemia, vascular changes and commorbidities.  Patient has a meter and daughter plans to check atleast once per day and encourage patient to check in the evening. Reviewed when to call MD, provided  PCP information, and reviewed target goals and interventions.  [name] has never used an insulin pen before, however feels confident she can perform when needed. Sent videos links to patient to review insulin pen. Has no further questions at this time.   Thanks, Bronson Curb, MSN, RNC-OB Diabetes Coordinator [phone] (8a-5p)

## 2020-09-10 NOTE — Progress Notes (Signed)
PROGRESS NOTE    John Tavella Sr.  ERD:[id number] DOB: [birth year] DOA: 09/08/2020 PCP: Perlie Mayo, NP    Brief Narrative: This 69 yrs old male, former smoker, with history of alcohol use, type 2 diabetes, chronic lower limb ischemia status post amputation 07/2018, prior falls with accidental rib fracture, questionable right kidney mass recently diagnosed with new onset pancreatic cancer metastatic to the liver,  started on chemotherapy, status post 5 cycles with Dr. Morey Hummingbird, presented in the emergency department with blood glucose of 610 with anion gap of 9.  Patient was admitted for DKA and started on insulin drip.  Patient has been transferred from [place]:   Principal Problem:   Hyperglycemia Active Problems:   PAD (peripheral artery disease) (HCC)   Left below-knee amputee ([place])   Type 2 diabetes mellitus with peripheral neuropathy ([place])   Essential hypertension   Pancreatic cancer metastasized to liver [facility])   Exocrine pancreatic insufficiency  DKA in the setting of poorly controlled diabetes mellitus type 2: Patient was started on insulin drip.  Anion gap closed,  transitioned to Lantus. Continue Lantus 20 units now- usually uses 25 units at bedtime Reinforced with patient need for good glycemic control Continue LR 75 cc/h   Will resume Metformin 1000 twice daily .  Metastatic pancreatic cancer: Continue palliative chemotherapy as an outpatient with Dr. Alfonzo Feller, palonosetron 5-FU [employer] for now and can resume as an outpatient Resumed Creon 36,000 3 times daily.  Right greater toe ulcer: Right great toe wound looks clean. No need for antibiotics,  imaging negative for any osteomyelitis.  Left BKA secondary to PAD critical ischemia: Ulcer on the lateral side of BKA  Hypertension: Not on any blood pressure medications.  BPH: Continue Flomax   DVT prophylaxis: Lovenox Code Status: Full code Family Communication: No  family at bedside Disposition Plan:  Status is: Inpatient  Remains inpatient appropriate because:Inpatient level of care appropriate due to severity of illness   Dispo: The patient is from: Home              Anticipated d/c is to: Home              Anticipated d/c date is: 2 days              Patient currently is not medically stable to d/c.   Consultants:   None  Procedures:  Antimicrobials:  Anti-infectives (From admission, onward)   Start     Dose/Rate Route Frequency Ordered Stop   09/08/20 2015  cefTRIAXone (ROCEPHIN) 1 g in sodium chloride 0.9 % 100 mL IVPB        1 g 200 mL/hr over 30 Minutes Intravenous  Once 09/08/20 2001 09/08/20 2048      Subjective: Patient was seen and examined at bedside.  Overnight events noted.   Patient appears more alert awake and oriented.  Denies any concerns.  Objective: Vitals:   09/09/20 2106 09/10/20 0124 09/10/20 0514 09/10/20 1502  BP: 129/81 118/78 128/82 128/66  Pulse: 81 (!) 102 73 80  Resp: 14 14 14 15   Temp: 99.1 F (37.3 C) 98 F (36.7 C) 98.3 F (36.8 C) 98.2 F (36.8 C)  TempSrc: Oral Oral Oral Oral  SpO2: 100% 93% 95% 97%  Weight:      Height:        Intake/Output Summary (Last 24 hours) at 09/10/2020 1548 Last data filed at 09/10/2020 1500 Gross per 24 hour  Intake 1724.85 ml  Output 200 ml  Net 1524.85 ml   Filed Weights   09/08/20 1740  Weight: 62.1 kg    Examination:  General exam: Appears calm and comfortable, not in any distress Respiratory system: Clear to auscultation. Respiratory effort normal. Cardiovascular system: S1 & S2 heard, RRR. No JVD, murmurs, rubs, gallops or clicks. No pedal edema. Gastrointestinal system: Abdomen is nondistended, soft and nontender. No organomegaly or masses felt. Normal bowel sounds heard. Central nervous system: Alert and oriented. No focal neurological deficits. Extremities: Left BKA. Skin: No rashes, lesions or ulcers Psychiatry: Judgement and insight  appear normal. Mood & affect appropriate.     Data Reviewed: I have personally reviewed following labs and imaging studies  CBC: Recent Labs  Lab 09/07/20 0802 09/08/20 1816 09/10/20 0616  WBC 8.0 7.8 5.2  NEUTROABS 5.5 5.8 3.4  HGB 11.0* 11.3* 9.8*  HCT 34.8* 34.9* 31.3*  MCV 94.1 93.8 94.8  PLT 150 166 332*   Basic Metabolic Panel: Recent Labs  Lab 09/07/20 0802 09/07/20 1145 09/08/20 1816 09/09/20 0242 09/09/20 0627  NA 132*  --  133* 138 136  K 3.5  --  4.1 3.2* 3.2*  CL 99  --  99 104 104  CO2 27  --  25 27 27   GLUCOSE 529* 326* 610* 52* 110*  BUN 9  --  14 10 11   CREATININE 0.65  --  0.70 0.49* 0.46*  CALCIUM 8.6*  --  9.0 8.4* 8.1*  MG 1.8  --  2.0  --   --    GFR: Estimated Creatinine Clearance: 77.6 mL/min (A) (by C-G formula based on SCr of 0.46 mg/dL (L)). Liver Function Tests: Recent Labs  Lab 09/07/20 0802 09/08/20 1816  AST 31 68*  ALT 25 34  ALKPHOS 297* 309*  BILITOT 0.5 0.5  PROT 6.6 7.0  ALBUMIN 2.6* 2.8*   No results for input(s): LIPASE, AMYLASE in the last 168 hours. No results for input(s): AMMONIA in the last 168 hours. Coagulation Profile: No results for input(s): INR, PROTIME in the last 168 hours. Cardiac Enzymes: No results for input(s): CKTOTAL, CKMB, CKMBINDEX, TROPONINI in the last 168 hours. BNP (last 3 results) No results for input(s): PROBNP in the last 8760 hours. HbA1C: No results for input(s): HGBA1C in the last 72 hours. CBG: Recent Labs  Lab 09/09/20 2014 09/09/20 2115 09/10/20 0733 09/10/20 0811 09/10/20 1217  GLUCAP 110* 199* 59* 87 200*   Lipid Profile: No results for input(s): CHOL, HDL, LDLCALC, TRIG, CHOLHDL, LDLDIRECT in the last 72 hours. Thyroid Function Tests: No results for input(s): TSH, T4TOTAL, FREET4, T3FREE, THYROIDAB in the last 72 hours. Anemia Panel: No results for input(s): VITAMINB12, FOLATE, FERRITIN, TIBC, IRON, RETICCTPCT in the last 72 hours. Sepsis Labs: No results for  input(s): PROCALCITON, LATICACIDVEN in the last 168 hours.  Recent Results (from the past 240 hour(s))  Resp Panel by RT-PCR (Flu A&B, Covid) Nasopharyngeal Swab     Status: None   Collection Time: 09/08/20  7:24 PM   Specimen: Nasopharyngeal Swab; Nasopharyngeal(NP) swabs in vial transport medium  Result Value Ref Range Status   SARS Coronavirus 2 by RT PCR NEGATIVE NEGATIVE Final    Comment: (NOTE) SARS-CoV-2 target nucleic acids are NOT DETECTED.  The SARS-CoV-2 RNA is generally detectable in upper respiratory specimens during the acute phase of infection. The lowest concentration of SARS-CoV-2 viral copies this assay can detect is 138 copies/mL. A negative result does not preclude SARS-Cov-2 infection and should not be used  as the sole basis for treatment or other patient management decisions. A negative result may occur with  improper specimen collection/handling, submission of specimen other than nasopharyngeal swab, presence of viral mutation(s) within the areas targeted by this assay, and inadequate number of viral copies(<138 copies/mL). A negative result must be combined with clinical observations, patient history, and epidemiological information. The expected result is Negative.  Fact Sheet for Patients:  EntrepreneurPulse.com.au  Fact Sheet for Healthcare Providers:  IncredibleEmployment.be  This test is no t yet approved or cleared by the [place] FDA and  has been authorized for detection and/or diagnosis of SARS-CoV-2 by FDA under an Emergency Use Authorization (EUA). This EUA will remain  in effect (meaning this test can be used) for the duration of the COVID-19 declaration under Section 564(b)(1) of the Act, 21 U.S.C.section 360bbb-3(b)(1), unless the authorization is terminated  or revoked sooner.       Influenza A by PCR NEGATIVE NEGATIVE Final   Influenza B by PCR NEGATIVE NEGATIVE Final    Comment: (NOTE) The  Xpert Xpress SARS-CoV-2/FLU/RSV plus assay is intended as an aid in the diagnosis of influenza from Nasopharyngeal swab specimens and should not be used as a sole basis for treatment. Nasal washings and aspirates are unacceptable for Xpert Xpress SARS-CoV-2/FLU/RSV testing.  Fact Sheet for Patients: EntrepreneurPulse.com.au  Fact Sheet for Healthcare Providers: IncredibleEmployment.be  This test is not yet approved or cleared by the [place] FDA and has been authorized for detection and/or diagnosis of SARS-CoV-2 by FDA under an Emergency Use Authorization (EUA). This EUA will remain in effect (meaning this test can be used) for the duration of the COVID-19 declaration under Section 564(b)(1) of the Act, 21 U.S.C. section 360bbb-3(b)(1), unless the authorization is terminated or revoked.  Performed at [facility], [address] [place], [place] [phone]          Radiology Studies: DG Foot Complete Right  Result Date: 09/08/2020 CLINICAL DATA:  Diabetic foot. Right foot pain with wound about the great toe. EXAM: RIGHT FOOT COMPLETE - 3+ VIEW COMPARISON:  None. FINDINGS: No abnormal bone density, bony destruction or erosion to suggest osteomyelitis. Normal alignment. Osteoarthritis of the first metatarsal phalangeal joint. Advanced vascular calcifications. No soft tissue air or radiopaque foreign body. Site of wound is not well delineated by radiograph. IMPRESSION: 1. No radiographic evidence of osteomyelitis. 2. Osteoarthritis of the first metatarsophalangeal joint. 3. Advanced vascular calcifications. Electronically Signed   By: Keith Rake M.D.   On: 09/08/2020 19:05   Scheduled Meds:  Chlorhexidine Gluconate Cloth  6 each Topical Daily   enoxaparin (LOVENOX) injection  40 mg Subcutaneous Q24H   insulin aspart  0-5 Units Subcutaneous QHS   insulin aspart  0-6 Units Subcutaneous TID WC   insulin aspart  2 Units Subcutaneous  TID WC   insulin detemir  25 Units Subcutaneous Daily   lipase/protease/amylase  36,000 Units Oral TID WC   metFORMIN  1,000 mg Oral BID WC   tamsulosin  0.4 mg Oral QHS   Continuous Infusions:  lactated ringers 125 mL/hr at 09/09/20 1145   lactated ringers 75 mL/hr at 09/10/20 1303     LOS: 0 days    Time spent: 25 mins    Gershom Brobeck, MD [facility]   If 7PM-7AM, please contact night-coverage

## 2020-09-11 LAB — CBC WITH DIFFERENTIAL/PLATELET
Abs Immature Granulocytes: 0.03 10*3/uL (ref 0.00–0.07)
Basophils Absolute: 0 10*3/uL (ref 0.0–0.1)
Basophils Relative: 1 %
Eosinophils Absolute: 0.2 10*3/uL (ref 0.0–0.5)
Eosinophils Relative: 4 %
HCT: 33.6 % — ABNORMAL LOW (ref 39.0–52.0)
Hemoglobin: 10.7 g/dL — ABNORMAL LOW (ref 13.0–17.0)
Immature Granulocytes: 1 %
Lymphocytes Relative: 27 %
Lymphs Abs: 1.5 10*3/uL (ref 0.7–4.0)
MCH: 29.6 pg (ref 26.0–34.0)
MCHC: 31.8 g/dL (ref 30.0–36.0)
MCV: 92.8 fL (ref 80.0–100.0)
Monocytes Absolute: 0.2 10*3/uL (ref 0.1–1.0)
Monocytes Relative: 3 %
Neutro Abs: 3.6 10*3/uL (ref 1.7–7.7)
Neutrophils Relative %: 64 %
Platelets: 143 10*3/uL — ABNORMAL LOW (ref 150–400)
RBC: 3.62 MIL/uL — ABNORMAL LOW (ref 4.22–5.81)
RDW: 17.6 % — ABNORMAL HIGH (ref 11.5–15.5)
WBC: 5.6 10*3/uL (ref 4.0–10.5)
nRBC: 0 % (ref 0.0–0.2)

## 2020-09-11 LAB — COMPREHENSIVE METABOLIC PANEL
ALT: 39 U/L (ref 0–44)
AST: 66 U/L — ABNORMAL HIGH (ref 15–41)
Albumin: 2.3 g/dL — ABNORMAL LOW (ref 3.5–5.0)
Alkaline Phosphatase: 288 U/L — ABNORMAL HIGH (ref 38–126)
Anion gap: 9 (ref 5–15)
BUN: 8 mg/dL (ref 8–23)
CO2: 26 mmol/L (ref 22–32)
Calcium: 8.2 mg/dL — ABNORMAL LOW (ref 8.9–10.3)
Chloride: 100 mmol/L (ref 98–111)
Creatinine, Ser: 0.52 mg/dL — ABNORMAL LOW (ref 0.61–1.24)
GFR, Estimated: >60 mL/min (ref >60)
Glucose, Bld: 51 mg/dL — ABNORMAL LOW (ref 70–99)
Potassium: 3.4 mmol/L — ABNORMAL LOW (ref 3.5–5.1)
Sodium: 135 mmol/L (ref 135–145)
Total Bilirubin: 0.3 mg/dL (ref 0.3–1.2)
Total Protein: 5.8 g/dL — ABNORMAL LOW (ref 6.5–8.1)

## 2020-09-11 LAB — GLUCOSE, CAPILLARY
Glucose-Capillary: 86 mg/dL (ref 70–99)
Glucose-Capillary: 128 mg/dL — ABNORMAL HIGH (ref 70–99)

## 2020-09-11 LAB — MAGNESIUM: Magnesium: 1.6 mg/dL — ABNORMAL LOW (ref 1.7–2.4)

## 2020-09-11 LAB — PHOSPHORUS: Phosphorus: 2.9 mg/dL (ref 2.5–4.6)

## 2020-09-11 MED ORDER — POTASSIUM CHLORIDE 20 MEQ PO PACK
40.0000 meq | PACK | Freq: Once | ORAL | Status: AC
  Administered 2020-09-11: 09:00:00 40 meq via ORAL
  Filled 2020-09-11: qty 2

## 2020-09-11 MED ORDER — MAGNESIUM SULFATE 2 GM/50ML IV SOLN
2.0000 g | Freq: Once | INTRAVENOUS | Status: AC
  Administered 2020-09-11: 10:00:00 2 g via INTRAVENOUS
  Filled 2020-09-11: qty 50

## 2020-09-11 NOTE — Discharge Summary (Addendum)
Physician Discharge Summary  John Postlewait Sr. JIR:[id number] DOB: Aug 14, 1951 DOA: 09/08/2020  PCP: Perlie Mayo, NP  Admit date: 09/08/2020 .  Discharge date: 09/11/2020  Admitted From: Home.  Disposition:  Home  Recommendations for Outpatient Follow-up:  1. Follow up with PCP in 1-2 weeks. 2. Please obtain BMP/CBC in one week. 3. Advised to continue current medications. 4. Advised to follow up Oncologist Dr. Morey Hummingbird as scheduled. 5. Advised to follow up Podiatrist as scheduled.  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 Morey Hummingbird, presented in the emergency department with blood glucose of 610 with anion gap of 9.  Patient was admitted for hyperglycemia consistent with DKA and was started on insulin drip.  Patient has been transferred from [facility].  Patient was continued on insulin drip , was successfully transitioned to subcu insulin.  Blood sugars has been improved.  Patient has dry wound on the right great toe, does not appear infected.  Diabetic counseling was completed.  It was explained to the patient in detail about the importance of diabetic medications and follow-ups.  Patient reports feeling better and wants to be discharged.  He was managed for below problems.   Discharge Diagnoses:  Principal Problem:   Hyperglycemia Active Problems:   PAD (peripheral artery disease) (HCC)   Left below-knee amputee (HCC)   Type 2 diabetes mellitus with peripheral neuropathy ([place])   Essential  hypertension   Pancreatic cancer metastasized to liver [facility])   Exocrine pancreatic insufficiency   DKA in the setting of poorly controlled diabetes mellitus type 2: Patient was started on insulin drip.  Anion gap closed,  transitioned to Lantus. Continue Lantus 20 units now- usually uses 25 units at bedtime Reinforced with patient need for good glycemic control Continue LR 75 cc/h   Will resume Metformin 1000 twice daily .   Metastatic pancreatic cancer: Continue palliative chemotherapy as an outpatient with Dr. Alfonzo Feller, palonosetron 5-FU [employer] for now and can resume as an outpatient Resumed Creon 36,000 3 times daily.   Right greater toe ulcer: Right great toe wound looks clean. No need for antibiotics,  imaging negative for any osteomyelitis.   Left BKA secondary to PAD critical ischemia: Ulcer on the lateral side of BKA: Healed.   Hypertension: Not on any blood pressure medications.   BPH: Continue Flomax    Discharge Instructions  Discharge Instructions    Call MD for:  persistant dizziness or light-headedness   Complete by: As directed    Call MD for:  persistant nausea and vomiting   Complete by: As directed    Call MD for:  redness, tenderness, or signs of infection (pain, swelling, redness, odor or green/yellow discharge around incision site)   Complete by: As directed    Call MD for:  temperature >100.4   Complete by: As directed    Diet - low sodium heart healthy   Complete by: As directed    Diet Carb Modified   Complete by: As directed    Discharge instructions   Complete by: As directed    Advised  to follow up PCP in one week. Advised to continue current medicatuons. Advised to follow up Oncologist Dr. Morey Hummingbird as scheduled.   Increase activity slowly   Complete by: As directed    No wound care   Complete by: As directed      Allergies as of 09/11/2020      Reactions   Tea Itching, Rash      Medication List    STOP taking these  medications   oxyCODONE 5 MG immediate release tablet Commonly known as: Roxicodone     TAKE these medications   Accu-Chek Guide Me w/Device Kit 1 Piece by Does not apply route as directed.   Accu-Chek Guide test strip Generic drug: glucose blood Test BG 4 x daily. DX E11.65   Accu-Chek Softclix Lancets lancets 1 each by Other route in the morning, at noon, in the evening, and at bedtime. Dx: E11.65   acetaminophen 500 MG tablet Commonly known as: TYLENOL Take 500-1,000 mg by mouth every 6 (six) hours as needed (for pain).   bisacodyl 5 MG EC tablet Commonly known as: DULCOLAX Take 5 mg by mouth daily as needed for moderate constipation.   blood glucose meter kit and supplies Kit Dispense based on patient and insurance preference. Use up to four times daily as directed. (FOR ICD-9 250.00, 250.01).   dexamethasone 10 MG/ML injection Commonly known as: DECADRON Inject 10 mg into the vein every 14 (fourteen) days. Dexamethasone 77m in NS 50 ml IVPB prior to chemotherapy administration   fluorouracil CALGB 897353in sodium chloride 0.9 % 150 mL Inject 2,400 mg/m2 into the vein over 48 hr.   fosaprepitant 150 MG Solr injection Commonly known as: EMEND Inject 150 mg into the vein every 14 (fourteen) days. Prior to chemotherapy administration   Insulin Pen Needle 32G X 4 MM Misc Use to inject insulin   IRINOTECAN HCL IV Inject 280 mg/m2 into the vein every 14 (fourteen) days.   LEUCOVORIN CALCIUM IV Inject 400 mg/m2 into the vein every 14 (fourteen) days.   lidocaine-prilocaine cream Commonly known as: EMLA Apply a small amount to port a cath site and cover with plastic wrap 1 hour prior to chemotherapy appointments   lipase/protease/amylase 36000 UNITS Cpep capsule Commonly known as: Creon Take 1 capsule (36,000 Units total) by mouth 3 (three) times daily with meals AND 1 capsule (36,000 Units total) with snacks.   metFORMIN 500 MG tablet Commonly known as:  GLUCOPHAGE Take 1,000 mg by mouth 2 (two) times daily.   OXALIPLATIN IV Inject 85 mg/m2 into the vein every 14 (fourteen) days.   palonosetron 0.25 MG/5ML injection Commonly known as: ALOXI Inject 0.25 mg into the vein every 14 (fourteen) days. Prior to chemotherapy administration   potassium chloride SA 20 MEQ tablet Commonly known as: KLOR-CON Take 1 tablet (20 mEq total) by mouth 2 (two) times daily.   prochlorperazine 10 MG tablet Commonly known as: COMPAZINE Take 1 tablet (10 mg total) by mouth every 6 (six) hours as needed for nausea or vomiting.   tamsulosin 0.4 MG Caps capsule Commonly known as: Flomax Take 1 capsule (0.4 mg total) by mouth at bedtime.   TTyler AasFlexTouch 100 UNIT/ML FlexTouch Pen Generic drug: insulin degludec Inject 25 Units into the skin at bedtime.            Durable Medical Equipment  (From admission, onward)         Start     Ordered   09/09/20 1550  For home  use only DME 4 wheeled rolling walker with seat  Once       Question:  Patient needs a walker to treat with the following condition  Answer:  Debility   09/09/20 [place], [facility] Follow up.   Why: Wheelchair Contact information: [address] [address] [phone]        Perlie Mayo, NP Follow up in 1 week(s).   Specialty: Family Medicine Contact information: [place] [address] [phone]        Posey Pronto, Deep, DPM Follow up.   Specialty: Podiatry Contact information: [place] [address] [phone]              Allergies  Allergen Reactions  . Tea Itching and Rash    Consultations:  None   Procedures/Studies: DG Foot Complete Right  Result Date: 09/08/2020 CLINICAL DATA:  Diabetic foot. Right foot pain with wound about the great toe. EXAM: RIGHT FOOT COMPLETE - 3+ VIEW COMPARISON:  None. FINDINGS: No abnormal bone density, bony  destruction or erosion to suggest osteomyelitis. Normal alignment. Osteoarthritis of the first metatarsal phalangeal joint. Advanced vascular calcifications. No soft tissue air or radiopaque foreign body. Site of wound is not well delineated by radiograph. IMPRESSION: 1. No radiographic evidence of osteomyelitis. 2. Osteoarthritis of the first metatarsophalangeal joint. 3. Advanced vascular calcifications. Electronically Signed   By: Keith Rake M.D.   On: 09/08/2020 19:05    None   Subjective: Patient was seen and examined at bed side, denies any overnight events, He reports feeling better and wants to be discharged.  Discharge Exam: Vitals:   09/10/20 2017 09/11/20 0554  BP: 128/88 119/83  Pulse: 81 85  Resp: 14 14  Temp: 98.1 F (36.7 C) 98.2 F (36.8 C)  SpO2: 97% 94%   Vitals:   09/10/20 1502 09/10/20 1825 09/10/20 2017 09/11/20 0554  BP: 128/66 127/89 128/88 119/83  Pulse: 80 85 81 85  Resp: _0 Temp: 98.2 F (36.8 C) 98.3 F (36.8 C) 98.1 F (36.7 C) 98.2 F (36.8 C)  TempSrc: Oral Oral Oral Oral  SpO2: 97% 97% 97% 94%  Weight:      Height:        General: Pt is alert, awake, not in acute distress Cardiovascular: RRR, S1/S2 +, no rubs, no gallops Respiratory: CTA bilaterally, no wheezing, no rhonchi Abdominal: Soft, NT, ND, bowel sounds + Extremities: Left BKA, no edema, no cyanosis    The results of significant diagnostics from this hospitalization (including imaging, microbiology, ancillary and laboratory) are listed below for reference.     Microbiology: Recent Results (from the past 240 hour(s))  Resp Panel by RT-PCR (Flu A&B, Covid) Nasopharyngeal Swab     Status: None   Collection Time: 09/08/20  7:24 PM   Specimen: Nasopharyngeal Swab; Nasopharyngeal(NP) swabs in vial transport medium  Result Value Ref Range Status   SARS Coronavirus 2 by RT PCR NEGATIVE NEGATIVE Final    Comment: (NOTE) SARS-CoV-2 target nucleic acids are NOT  DETECTED.  The SARS-CoV-2 RNA is generally detectable in upper respiratory specimens during the acute phase of infection. The lowest concentration of SARS-CoV-2 viral copies this assay can detect is 138 copies/mL. A negative result does not preclude SARS-Cov-2 infection and should not be used as the sole basis for treatment or other patient management decisions. A  negative result may occur with  improper specimen collection/handling, submission of specimen other than nasopharyngeal swab, presence of viral mutation(s) within the areas targeted by this assay, and inadequate number of viral copies(<138 copies/mL). A negative result must be combined with clinical observations, patient history, and epidemiological information. The expected result is Negative.  Fact Sheet for Patients:  EntrepreneurPulse.com.au  Fact Sheet for Healthcare Providers:  IncredibleEmployment.be  This test is no t yet approved or cleared by the [place] FDA and  has been authorized for detection and/or diagnosis of SARS-CoV-2 by FDA under an Emergency Use Authorization (EUA). This EUA will remain  in effect (meaning this test can be used) for the duration of the COVID-19 declaration under Section 564(b)(1) of the Act, 21 U.S.C.section 360bbb-3(b)(1), unless the authorization is terminated  or revoked sooner.       Influenza A by PCR NEGATIVE NEGATIVE Final   Influenza B by PCR NEGATIVE NEGATIVE Final    Comment: (NOTE) The Xpert Xpress SARS-CoV-2/FLU/RSV plus assay is intended as an aid in the diagnosis of influenza from Nasopharyngeal swab specimens and should not be used as a sole basis for treatment. Nasal washings and aspirates are unacceptable for Xpert Xpress SARS-CoV-2/FLU/RSV testing.  Fact Sheet for Patients: EntrepreneurPulse.com.au  Fact Sheet for Healthcare Providers: IncredibleEmployment.be  This test is not yet  approved or cleared by the [place] FDA and has been authorized for detection and/or diagnosis of SARS-CoV-2 by FDA under an Emergency Use Authorization (EUA). This EUA will remain in effect (meaning this test can be used) for the duration of the COVID-19 declaration under Section 564(b)(1) of the Act, 21 U.S.C. section 360bbb-3(b)(1), unless the authorization is terminated or revoked.  Performed at [facility], [address] [place], [place] [address]      Labs: BNP (last 3 results) No results for input(s): BNP in the last 8760 hours. Basic Metabolic Panel: Recent Labs  Lab 09/07/20 0802 09/07/20 1145 09/08/20 1816 09/09/20 0242 09/09/20 0627 09/11/20 0614  NA 132*  --  133* 138 136 135  K 3.5  --  4.1 3.2* 3.2* 3.4*  CL 99  --  99 104 104 100  CO2 27  --  _0 GLUCOSE 529* 326* 610* 52* 110* 51*  BUN 9  --  _1 CREATININE 0.65  --  0.70 0.49* 0.46* 0.52*  CALCIUM 8.6*  --  9.0 8.4* 8.1* 8.2*  MG 1.8  --  2.0  --   --  1.6*  PHOS  --   --   --   --   --  2.9   Liver Function Tests: Recent Labs  Lab 09/07/20 0802 09/08/20 1816 09/11/20 0614  AST 31 68* 66*  ALT 25 34 39  ALKPHOS 297* 309* 288*  BILITOT 0.5 0.5 0.3  PROT 6.6 7.0 5.8*  ALBUMIN 2.6* 2.8* 2.3*   No results for input(s): LIPASE, AMYLASE in the last 168 hours. No results for input(s): AMMONIA in the last 168 hours. CBC: Recent Labs  Lab 09/07/20 0802 09/08/20 1816 09/10/20 0616 09/11/20 0614  WBC 8.0 7.8 5.2 5.6  NEUTROABS 5.5 5.8 3.4 3.6  HGB 11.0* 11.3* 9.8* 10.7*  HCT 34.8* 34.9* 31.3* 33.6*  MCV 94.1 93.8 94.8 92.8  PLT 150 166 136* 143*   Cardiac Enzymes: No results for input(s): CKTOTAL, CKMB, CKMBINDEX, TROPONINI in the last 168 hours. BNP: Invalid input(s): POCBNP CBG: Recent Labs  Lab 09/10/20 0811 09/10/20 1217 09/10/20  1755 09/10/20 2018 09/11/20 0748  GLUCAP 87 200* 167* 74 86   D-Dimer No results for input(s): DDIMER in the last 72  hours. Hgb A1c No results for input(s): HGBA1C in the last 72 hours. Lipid Profile No results for input(s): CHOL, HDL, LDLCALC, TRIG, CHOLHDL, LDLDIRECT in the last 72 hours. Thyroid function studies No results for input(s): TSH, T4TOTAL, T3FREE, THYROIDAB in the last 72 hours.  Invalid input(s): FREET3 Anemia work up No results for input(s): VITAMINB12, FOLATE, FERRITIN, TIBC, IRON, RETICCTPCT in the last 72 hours. Urinalysis    Component Value Date/Time   COLORURINE YELLOW 09/08/2020 1750   APPEARANCEUR CLEAR 09/08/2020 1750   LABSPEC 1.028 09/08/2020 1750   PHURINE 6.0 09/08/2020 1750   GLUCOSEU >=500 (A) 09/08/2020 1750   HGBUR NEGATIVE 09/08/2020 1750   BILIRUBINUR NEGATIVE 09/08/2020 1750   KETONESUR NEGATIVE 09/08/2020 1750   PROTEINUR NEGATIVE 09/08/2020 1750   NITRITE NEGATIVE 09/08/2020 1750   LEUKOCYTESUR NEGATIVE 09/08/2020 1750   Sepsis Labs Invalid input(s): PROCALCITONIN,  WBC,  LACTICIDVEN Microbiology Recent Results (from the past 240 hour(s))  Resp Panel by RT-PCR (Flu A&B, Covid) Nasopharyngeal Swab     Status: None   Collection Time: 09/08/20  7:24 PM   Specimen: Nasopharyngeal Swab; Nasopharyngeal(NP) swabs in vial transport medium  Result Value Ref Range Status   SARS Coronavirus 2 by RT PCR NEGATIVE NEGATIVE Final    Comment: (NOTE) SARS-CoV-2 target nucleic acids are NOT DETECTED.  The SARS-CoV-2 RNA is generally detectable in upper respiratory specimens during the acute phase of infection. The lowest concentration of SARS-CoV-2 viral copies this assay can detect is 138 copies/mL. A negative result does not preclude SARS-Cov-2 infection and should not be used as the sole basis for treatment or other patient management decisions. A negative result may occur with  improper specimen collection/handling, submission of specimen other than nasopharyngeal swab, presence of viral mutation(s) within the areas targeted by this assay, and inadequate number  of viral copies(<138 copies/mL). A negative result must be combined with clinical observations, patient history, and epidemiological information. The expected result is Negative.  Fact Sheet for Patients:  EntrepreneurPulse.com.au  Fact Sheet for Healthcare Providers:  IncredibleEmployment.be  This test is no t yet approved or cleared by the [place] FDA and  has been authorized for detection and/or diagnosis of SARS-CoV-2 by FDA under an Emergency Use Authorization (EUA). This EUA will remain  in effect (meaning this test can be used) for the duration of the COVID-19 declaration under Section 564(b)(1) of the Act, 21 U.S.C.section 360bbb-3(b)(1), unless the authorization is terminated  or revoked sooner.       Influenza A by PCR NEGATIVE NEGATIVE Final   Influenza B by PCR NEGATIVE NEGATIVE Final    Comment: (NOTE) The Xpert Xpress SARS-CoV-2/FLU/RSV plus assay is intended as an aid in the diagnosis of influenza from Nasopharyngeal swab specimens and should not be used as a sole basis for treatment. Nasal washings and aspirates are unacceptable for Xpert Xpress SARS-CoV-2/FLU/RSV testing.  Fact Sheet for Patients: EntrepreneurPulse.com.au  Fact Sheet for Healthcare Providers: IncredibleEmployment.be  This test is not yet approved or cleared by the [place] FDA and has been authorized for detection and/or diagnosis of SARS-CoV-2 by FDA under an Emergency Use Authorization (EUA). This EUA will remain in effect (meaning this test can be used) for the duration of the COVID-19 declaration under Section 564(b)(1) of the Act, 21 U.S.C. section 360bbb-3(b)(1), unless the authorization is terminated or revoked.  Performed at  [address] [place], [place] [address]      Time coordinating discharge: Over 30 minutes  SIGNED:   Shawna Clamp, MD  [facility] 09/11/2020,  11:26 AM Pager   If 7PM-7AM, please contact night-coverage [email or web address]

## 2020-09-11 NOTE — Discharge Instructions (Signed)
Advised to follow up PCP in one week. Advised to continue current medicatuons. Advised to follow up Oncologist Dr. Morey Hummingbird as scheduled.

## 2020-09-11 NOTE — Progress Notes (Signed)
Pt discharged home with daughter in stable condition. Discharge instructions given. No immediate questions or concerns at this time.Marland Kitchen Discharged from unit via wheelchair.

## 2020-09-12 ENCOUNTER — Other Ambulatory Visit: Payer: Self-pay

## 2020-09-12 ENCOUNTER — Inpatient Hospital Stay (HOSPITAL_COMMUNITY)
Admission: EM | Admit: 2020-09-12 | Discharge: 2020-10-26 | DRG: 870 | Disposition: E | Payer: Medicare Other | Attending: Family Medicine | Admitting: Family Medicine

## 2020-09-12 ENCOUNTER — Emergency Department (HOSPITAL_COMMUNITY): Payer: Medicare Other

## 2020-09-12 ENCOUNTER — Encounter (HOSPITAL_COMMUNITY): Payer: Self-pay | Admitting: Emergency Medicine

## 2020-09-12 DIAGNOSIS — R6520 Severe sepsis without septic shock: Secondary | ICD-10-CM | POA: Diagnosis present

## 2020-09-12 DIAGNOSIS — R748 Abnormal levels of other serum enzymes: Secondary | ICD-10-CM | POA: Diagnosis present

## 2020-09-12 DIAGNOSIS — Z515 Encounter for palliative care: Secondary | ICD-10-CM

## 2020-09-12 DIAGNOSIS — R64 Cachexia: Secondary | ICD-10-CM | POA: Diagnosis present

## 2020-09-12 DIAGNOSIS — J189 Pneumonia, unspecified organism: Secondary | ICD-10-CM | POA: Diagnosis not present

## 2020-09-12 DIAGNOSIS — Z66 Do not resuscitate: Secondary | ICD-10-CM | POA: Diagnosis not present

## 2020-09-12 DIAGNOSIS — E11649 Type 2 diabetes mellitus with hypoglycemia without coma: Secondary | ICD-10-CM | POA: Diagnosis not present

## 2020-09-12 DIAGNOSIS — I10 Essential (primary) hypertension: Secondary | ICD-10-CM | POA: Diagnosis not present

## 2020-09-12 DIAGNOSIS — E8809 Other disorders of plasma-protein metabolism, not elsewhere classified: Secondary | ICD-10-CM

## 2020-09-12 DIAGNOSIS — C259 Malignant neoplasm of pancreas, unspecified: Secondary | ICD-10-CM | POA: Diagnosis present

## 2020-09-12 DIAGNOSIS — E872 Acidosis, unspecified: Secondary | ICD-10-CM

## 2020-09-12 DIAGNOSIS — F17210 Nicotine dependence, cigarettes, uncomplicated: Secondary | ICD-10-CM | POA: Diagnosis present

## 2020-09-12 DIAGNOSIS — Z95828 Presence of other vascular implants and grafts: Secondary | ICD-10-CM | POA: Diagnosis not present

## 2020-09-12 DIAGNOSIS — Z7189 Other specified counseling: Secondary | ICD-10-CM | POA: Diagnosis not present

## 2020-09-12 DIAGNOSIS — R9431 Abnormal electrocardiogram [ECG] [EKG]: Secondary | ICD-10-CM | POA: Diagnosis present

## 2020-09-12 DIAGNOSIS — E1152 Type 2 diabetes mellitus with diabetic peripheral angiopathy with gangrene: Secondary | ICD-10-CM | POA: Diagnosis present

## 2020-09-12 DIAGNOSIS — I96 Gangrene, not elsewhere classified: Secondary | ICD-10-CM | POA: Diagnosis present

## 2020-09-12 DIAGNOSIS — J9600 Acute respiratory failure, unspecified whether with hypoxia or hypercapnia: Secondary | ICD-10-CM | POA: Diagnosis present

## 2020-09-12 DIAGNOSIS — G9341 Metabolic encephalopathy: Secondary | ICD-10-CM | POA: Diagnosis present

## 2020-09-12 DIAGNOSIS — A419 Sepsis, unspecified organism: Principal | ICD-10-CM | POA: Diagnosis present

## 2020-09-12 DIAGNOSIS — Z9181 History of falling: Secondary | ICD-10-CM

## 2020-09-12 DIAGNOSIS — Z794 Long term (current) use of insulin: Secondary | ICD-10-CM | POA: Diagnosis not present

## 2020-09-12 DIAGNOSIS — Z20822 Contact with and (suspected) exposure to covid-19: Secondary | ICD-10-CM | POA: Diagnosis present

## 2020-09-12 DIAGNOSIS — R4182 Altered mental status, unspecified: Secondary | ICD-10-CM | POA: Diagnosis present

## 2020-09-12 DIAGNOSIS — Z9221 Personal history of antineoplastic chemotherapy: Secondary | ICD-10-CM

## 2020-09-12 DIAGNOSIS — Z681 Body mass index (BMI) 19 or less, adult: Secondary | ICD-10-CM

## 2020-09-12 DIAGNOSIS — J9601 Acute respiratory failure with hypoxia: Secondary | ICD-10-CM | POA: Diagnosis not present

## 2020-09-12 DIAGNOSIS — E162 Hypoglycemia, unspecified: Secondary | ICD-10-CM

## 2020-09-12 DIAGNOSIS — Z8249 Family history of ischemic heart disease and other diseases of the circulatory system: Secondary | ICD-10-CM | POA: Diagnosis not present

## 2020-09-12 DIAGNOSIS — Z89512 Acquired absence of left leg below knee: Secondary | ICD-10-CM

## 2020-09-12 DIAGNOSIS — C787 Secondary malignant neoplasm of liver and intrahepatic bile duct: Secondary | ICD-10-CM | POA: Diagnosis present

## 2020-09-12 DIAGNOSIS — E43 Unspecified severe protein-calorie malnutrition: Secondary | ICD-10-CM | POA: Diagnosis present

## 2020-09-12 DIAGNOSIS — J690 Pneumonitis due to inhalation of food and vomit: Secondary | ICD-10-CM | POA: Diagnosis present

## 2020-09-12 LAB — COMPREHENSIVE METABOLIC PANEL
ALT: 44 U/L (ref 0–44)
AST: 71 U/L — ABNORMAL HIGH (ref 15–41)
Albumin: 2.6 g/dL — ABNORMAL LOW (ref 3.5–5.0)
Alkaline Phosphatase: 276 U/L — ABNORMAL HIGH (ref 38–126)
Anion gap: 6 (ref 5–15)
BUN: 8 mg/dL (ref 8–23)
CO2: 25 mmol/L (ref 22–32)
Calcium: 8.1 mg/dL — ABNORMAL LOW (ref 8.9–10.3)
Chloride: 100 mmol/L (ref 98–111)
Creatinine, Ser: 0.41 mg/dL — ABNORMAL LOW (ref 0.61–1.24)
GFR, Estimated: >60 mL/min (ref >60)
Glucose, Bld: 189 mg/dL — ABNORMAL HIGH (ref 70–99)
Potassium: 3.6 mmol/L (ref 3.5–5.1)
Sodium: 131 mmol/L — ABNORMAL LOW (ref 135–145)
Total Bilirubin: 0.6 mg/dL (ref 0.3–1.2)
Total Protein: 6.2 g/dL — ABNORMAL LOW (ref 6.5–8.1)

## 2020-09-12 LAB — URINALYSIS, ROUTINE W REFLEX MICROSCOPIC
Bilirubin Urine: NEGATIVE
Glucose, UA: 50 mg/dL — AB
Hgb urine dipstick: NEGATIVE
Ketones, ur: NEGATIVE mg/dL
Leukocytes,Ua: NEGATIVE
Nitrite: NEGATIVE
Protein, ur: NEGATIVE mg/dL
Specific Gravity, Urine: 1.012 (ref 1.005–1.030)
pH: 8 (ref 5.0–8.0)

## 2020-09-12 LAB — CBC WITH DIFFERENTIAL/PLATELET
Abs Immature Granulocytes: 0.09 10*3/uL — ABNORMAL HIGH (ref 0.00–0.07)
Basophils Absolute: 0 10*3/uL (ref 0.0–0.1)
Basophils Relative: 1 %
Eosinophils Absolute: 0 10*3/uL (ref 0.0–0.5)
Eosinophils Relative: 0 %
HCT: 34.4 % — ABNORMAL LOW (ref 39.0–52.0)
Hemoglobin: 11.1 g/dL — ABNORMAL LOW (ref 13.0–17.0)
Immature Granulocytes: 2 %
Lymphocytes Relative: 14 %
Lymphs Abs: 0.7 10*3/uL (ref 0.7–4.0)
MCH: 30.2 pg (ref 26.0–34.0)
MCHC: 32.3 g/dL (ref 30.0–36.0)
MCV: 93.5 fL (ref 80.0–100.0)
Monocytes Absolute: 0.2 10*3/uL (ref 0.1–1.0)
Monocytes Relative: 4 %
Neutro Abs: 3.9 10*3/uL (ref 1.7–7.7)
Neutrophils Relative %: 79 %
Platelets: 185 10*3/uL (ref 150–400)
RBC: 3.68 MIL/uL — ABNORMAL LOW (ref 4.22–5.81)
RDW: 17.5 % — ABNORMAL HIGH (ref 11.5–15.5)
WBC: 4.9 10*3/uL (ref 4.0–10.5)
nRBC: 0 % (ref 0.0–0.2)

## 2020-09-12 LAB — CBG MONITORING, ED
Glucose-Capillary: 103 mg/dL — ABNORMAL HIGH (ref 70–99)
Glucose-Capillary: 31 mg/dL — CL (ref 70–99)
Glucose-Capillary: 69 mg/dL — ABNORMAL LOW (ref 70–99)
Glucose-Capillary: 78 mg/dL (ref 70–99)
Glucose-Capillary: 82 mg/dL (ref 70–99)
Glucose-Capillary: 25 mg/dL — CL (ref 70–99)
Glucose-Capillary: 105 mg/dL — ABNORMAL HIGH (ref 70–99)
Glucose-Capillary: 73 mg/dL (ref 70–99)
Glucose-Capillary: 78 mg/dL (ref 70–99)
Glucose-Capillary: 128 mg/dL — ABNORMAL HIGH (ref 70–99)
Glucose-Capillary: 62 mg/dL — ABNORMAL LOW (ref 70–99)
Glucose-Capillary: 109 mg/dL — ABNORMAL HIGH (ref 70–99)
Glucose-Capillary: 88 mg/dL (ref 70–99)
Glucose-Capillary: 89 mg/dL (ref 70–99)
Glucose-Capillary: 62 mg/dL — ABNORMAL LOW (ref 70–99)
Glucose-Capillary: 77 mg/dL (ref 70–99)
Glucose-Capillary: 188 mg/dL — ABNORMAL HIGH (ref 70–99)

## 2020-09-12 LAB — BLOOD GAS, ARTERIAL
Acid-base deficit: 0.4 mmol/L (ref 0.0–2.0)
Bicarbonate: 24.8 mmol/L (ref 20.0–28.0)
FIO2: 40
O2 Saturation: 98.9 %
Patient temperature: 38.5
pCO2 arterial: 29.7 mmHg — ABNORMAL LOW (ref 32.0–48.0)
pH, Arterial: 7.494 — ABNORMAL HIGH (ref 7.350–7.450)
pO2, Arterial: 136 mmHg — ABNORMAL HIGH (ref 83.0–108.0)

## 2020-09-12 LAB — LACTIC ACID, PLASMA
Lactic Acid, Venous: 1.9 mmol/L (ref 0.5–1.9)
Lactic Acid, Venous: 2.5 mmol/L (ref 0.5–1.9)

## 2020-09-12 LAB — APTT: aPTT: 34 seconds (ref 24–36)

## 2020-09-12 LAB — PROTIME-INR
INR: 0.9 (ref 0.8–1.2)
Prothrombin Time: 12.2 seconds (ref 11.4–15.2)

## 2020-09-12 LAB — RESP PANEL BY RT-PCR (FLU A&B, COVID) ARPGX2
Influenza A by PCR: NEGATIVE
Influenza B by PCR: NEGATIVE
SARS Coronavirus 2 by RT PCR: NEGATIVE

## 2020-09-12 MED ORDER — SODIUM CHLORIDE 0.9% FLUSH
3.0000 mL | Freq: Two times a day (BID) | INTRAVENOUS | Status: DC
  Administered 2020-09-13 – 2020-09-26 (×25): 3 mL via INTRAVENOUS

## 2020-09-12 MED ORDER — NALOXONE HCL 2 MG/2ML IJ SOSY
2.0000 mg | PREFILLED_SYRINGE | Freq: Once | INTRAMUSCULAR | Status: AC
  Administered 2020-09-12: 15:00:00 2 mg via INTRAVENOUS

## 2020-09-12 MED ORDER — DEXTROSE 50 % IV SOLN: 25.0000 g | Freq: Once | INTRAVENOUS | Status: AC

## 2020-09-12 MED ORDER — ACETAMINOPHEN 650 MG RE SUPP
650.0000 mg | Freq: Once | RECTAL | Status: AC
  Administered 2020-09-12: 16:00:00 650 mg via RECTAL
  Filled 2020-09-12: qty 1

## 2020-09-12 MED ORDER — DEXTROSE 50 % IV SOLN
INTRAVENOUS | Status: AC
  Administered 2020-09-12: 15:00:00 50 mL via INTRAVENOUS
  Filled 2020-09-12: qty 50

## 2020-09-12 MED ORDER — SODIUM CHLORIDE 0.9 % IV SOLN
2.0000 g | Freq: Once | INTRAVENOUS | Status: AC
  Administered 2020-09-12: 16:00:00 2 g via INTRAVENOUS
  Filled 2020-09-12: qty 2

## 2020-09-12 MED ORDER — DEXTROSE 10 % IV SOLN: 100.0000 mL | Freq: Once | INTRAVENOUS | Status: DC

## 2020-09-12 MED ORDER — DEXTROSE 50 % IV SOLN: 50.0000 mL | Freq: Once | INTRAVENOUS | Status: AC

## 2020-09-12 MED ORDER — DEXTROSE 50 % IV SOLN
25.0000 g | Freq: Once | INTRAVENOUS | Status: AC
  Administered 2020-09-12: 22:00:00 25 g via INTRAVENOUS

## 2020-09-12 MED ORDER — SODIUM CHLORIDE 0.9 % IV BOLUS
2000.0000 mL | Freq: Once | INTRAVENOUS | Status: AC
  Administered 2020-09-12: 15:00:00 2000 mL via INTRAVENOUS

## 2020-09-12 MED ORDER — NALOXONE HCL 2 MG/2ML IJ SOSY
PREFILLED_SYRINGE | INTRAMUSCULAR | Status: AC
  Filled 2020-09-12: qty 2

## 2020-09-12 MED ORDER — SODIUM CHLORIDE 0.9 % IV SOLN
100.0000 mg | Freq: Two times a day (BID) | INTRAVENOUS | Status: DC
  Administered 2020-09-13 – 2020-09-17 (×10): 100 mg via INTRAVENOUS
  Filled 2020-09-12 (×17): qty 100

## 2020-09-12 MED ORDER — PROPOFOL 1000 MG/100ML IV EMUL
5.0000 ug/kg/min | INTRAVENOUS | Status: DC
  Administered 2020-09-12: 20:00:00 5 ug/kg/min via INTRAVENOUS
  Filled 2020-09-12 (×2): qty 100

## 2020-09-12 MED ORDER — DEXTROSE 50 % IV SOLN
INTRAVENOUS | Status: AC
  Administered 2020-09-12: 18:00:00 50 mL via INTRAVENOUS
  Filled 2020-09-12: qty 50

## 2020-09-12 MED ORDER — VANCOMYCIN HCL IN DEXTROSE 1-5 GM/200ML-% IV SOLN
1000.0000 mg | Freq: Once | INTRAVENOUS | Status: DC
  Filled 2020-09-12: qty 200

## 2020-09-12 MED ORDER — SODIUM CHLORIDE 0.9 % IV SOLN: 250.0000 mL | INTRAVENOUS | Status: DC | PRN

## 2020-09-12 MED ORDER — IOHEXOL 350 MG/ML SOLN
75.0000 mL | Freq: Once | INTRAVENOUS | Status: AC | PRN
  Administered 2020-09-12: 18:00:00 75 mL via INTRAVENOUS

## 2020-09-12 MED ORDER — VANCOMYCIN HCL 1500 MG/300ML IV SOLN
1500.0000 mg | Freq: Once | INTRAVENOUS | Status: AC
  Administered 2020-09-12: 17:00:00 1500 mg via INTRAVENOUS
  Filled 2020-09-12: qty 300

## 2020-09-12 MED ORDER — ENOXAPARIN SODIUM 40 MG/0.4ML ~~LOC~~ SOLN
40.0000 mg | SUBCUTANEOUS | Status: DC
  Administered 2020-09-13 – 2020-09-16 (×4): 40 mg via SUBCUTANEOUS
  Filled 2020-09-12 (×4): qty 0.4

## 2020-09-12 MED ORDER — DEXTROSE 50 % IV SOLN: 1.0000 | Freq: Once | INTRAVENOUS | Status: AC

## 2020-09-12 MED ORDER — FENTANYL 2500MCG IN NS 250ML (10MCG/ML) PREMIX INFUSION
10.0000 ug/h | INTRAVENOUS | Status: DC
  Administered 2020-09-12: 21:00:00 10 ug/h via INTRAVENOUS
  Filled 2020-09-12: qty 250

## 2020-09-12 MED ORDER — SODIUM CHLORIDE 0.9 % IV SOLN
2.0000 g | Freq: Three times a day (TID) | INTRAVENOUS | Status: DC
  Administered 2020-09-13 – 2020-09-16 (×11): 2 g via INTRAVENOUS
  Filled 2020-09-12 (×11): qty 2

## 2020-09-12 MED ORDER — VANCOMYCIN HCL 750 MG/150ML IV SOLN
750.0000 mg | Freq: Two times a day (BID) | INTRAVENOUS | Status: DC
  Administered 2020-09-13: 04:00:00 750 mg via INTRAVENOUS
  Filled 2020-09-12 (×2): qty 150

## 2020-09-12 MED ORDER — SODIUM CHLORIDE 0.9% FLUSH
3.0000 mL | INTRAVENOUS | Status: DC | PRN
Start: 2020-09-12 — End: 2020-09-27

## 2020-09-12 MED ORDER — DEXTROSE 10 % IV SOLN: Freq: Once | INTRAVENOUS | Status: AC

## 2020-09-12 MED ORDER — HYDROCORTISONE NA SUCCINATE PF 100 MG IJ SOLR
100.0000 mg | Freq: Once | INTRAMUSCULAR | Status: AC
  Administered 2020-09-13: 01:00:00 100 mg via INTRAVENOUS
  Filled 2020-09-12: qty 2

## 2020-09-12 MED ORDER — ETOMIDATE 2 MG/ML IV SOLN
20.0000 mg | Freq: Once | INTRAVENOUS | Status: AC
  Administered 2020-09-12: 20:00:00 20 mg via INTRAVENOUS

## 2020-09-12 MED ORDER — DEXTROSE 50 % IV SOLN
INTRAVENOUS | Status: AC
  Administered 2020-09-12: 20:00:00 25 g via INTRAVENOUS
  Filled 2020-09-12: qty 50

## 2020-09-12 MED ORDER — FENTANYL 2500MCG IN NS 250ML (10MCG/ML) PREMIX INFUSION
0.0000 ug/h | INTRAVENOUS | Status: DC
  Administered 2020-09-12: 22:00:00 25 ug/h via INTRAVENOUS

## 2020-09-12 MED ORDER — DEXTROSE 50 % IV SOLN
INTRAVENOUS | Status: AC
  Filled 2020-09-12: qty 50

## 2020-09-12 MED ORDER — LACTATED RINGERS IV BOLUS
30.0000 mL/kg | Freq: Once | INTRAVENOUS | Status: AC
  Administered 2020-09-12: 23:00:00 1860 mL via INTRAVENOUS

## 2020-09-12 MED ORDER — SUCCINYLCHOLINE CHLORIDE 20 MG/ML IJ SOLN
100.0000 mg | Freq: Once | INTRAMUSCULAR | Status: AC
  Administered 2020-09-12: 20:00:00 100 mg via INTRAVENOUS

## 2020-09-12 MED ORDER — ACETAMINOPHEN 650 MG RE SUPP
650.0000 mg | Freq: Once | RECTAL | Status: AC
  Administered 2020-09-12: 21:00:00 650 mg via RECTAL
  Filled 2020-09-12: qty 1

## 2020-09-12 NOTE — H&P (Signed)
History and Physical  John Mattioli Sr. LOV:[id number] DOB: [birth year] DOA: 09/19/2020  Referring physician: Milton Ferguson, MD PCP: Perlie Mayo, NP  Patient coming from: Home  Chief Complaint: Acute metabolic encephalopathy (patient unresponsive)  HPI: John Ferrentino Sr. is a 69 y.o. 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 Morey Hummingbird who presents to the emergency department via EMS due to being unresponsive at home.  Patient was recently admitted on 12/15 and discharged on 12/18 due to DKA.  Patient was unable to provide history due to being intubated and sedated, history was obtained from daughter at bedside, ED physician and ED medical record.  Per report, patient went to use the restroom around 4 AM this morning, around 10 AM, he told the son that he was going back to bed.  Daughter at bedside called from work this afternoon to check on patient, and patient's wife tried to wake him up at 2 PM today but he was lethargic and unresponsive, EMS was activated, on arrival of EMS team, blood glucose was noted to be in the 30s, 1 amp of D50 was given in the field, but blood glucose was still 31 when patient arrived to the ED. Per daughter, a used insulin syringe was noted in the trash and patient was reported to have had only one sandwich last night.  ED Course:  In the emergency department, he was febrile, tachycardic, and intermittently tachypneic.  Several rounds of IV D50 were given due to hypoglycemia and patient was started on IV D10W.  Teleneurology was consulted by ED physician, and CT of head without contrast done showed no acute intracranial process.  CT angiography of head and neck showed no acute large or medium vessel occlusion.  Patient was intubated due to  inability to protect airway.  Chest x-ray done showed hazy bilateral airspace opacities which may represent multifocal pneumonia in the appropriate clinical setting. Patient was empirically started on IV antibiotics (vancomycin, doxycycline and cefepime). PCCM was consulted, no beds in [facility] and [facility], it was recommended for patient to be admitted here at [facility] with follow-up of labs and ABG by Elink and with plan to be seen here in the morning by PCCM. Hospitalist was asked to admit patient for further evaluation management.   Review of Systems: This cannot be obtained at this time due to patient being intubated and sedated.  Past Medical History:  Diagnosis Date  . Acute blood loss anemia   . Arthritis   . Benign essential HTN   . Cancer [facility])    pancreatic  . Cellulitis 07/20/2018  . Cellulitis in diabetic foot ([place]) 07/20/2018  . Diabetes mellitus without complication ([place])   . Diabetes mellitus, type II ([place]) 07/20/2018  . Dry gangrene ([place]) 07/20/2018  . Emesis   . Family history of cervical cancer   . Heart murmur   . Hyperglycemia   . Hypertension   . Joint pain   . Port-A-Cath in place 05/25/2020  . Unilateral complete BKA, left, initial encounter [facility])    Past Surgical History:  Procedure Laterality Date  . AMPUTATION Left 08/02/2018   Procedure: LEFT AMPUTATION BELOW KNEE;  Surgeon: Marty Heck, MD;  Location: [place];  Service: Vascular;  Laterality: Left;  . BILIARY STENT PLACEMENT N/A 06/11/2020   Procedure: BILIARY STENT PLACEMENT;  Surgeon: Rogene Houston, MD;  [facility];  Service: Endoscopy;  Laterality: N/A;  . ELBOW SURGERY     Bone graft to repair elbow  . ERCP N/A 06/11/2020   Procedure: ENDOSCOPIC RETROGRADE CHOLANGIOPANCREATOGRAPHY (ERCP);  Surgeon: Rogene Houston, MD;  [facility];  Service: Endoscopy;  Laterality: N/A;  . LOWER EXTREMITY ANGIOGRAPHY N/A 07/24/2018   Procedure: LOWER EXTREMITY ANGIOGRAPHY;  Surgeon: Marty Heck, MD;  Location: [place] CV LAB;  Service: Cardiovascular;  Laterality: N/A;  . PERIPHERAL VASCULAR INTERVENTION  07/24/2018   Procedure: PERIPHERAL VASCULAR INTERVENTION;  Surgeon: Marty Heck, MD;  Location: [place] CV LAB;  Service: Cardiovascular;;  . PORTACATH PLACEMENT Left 05/21/2020   Procedure: INSERTION PORT-A-CATH;  Surgeon: Aviva Signs, MD;  Location: [facility] OR[facility];  Service: General;  Laterality: Left;  . SPHINCTEROTOMY  06/11/2020   Procedure: SHORT SPHINCTEROTOMY;  Surgeon: Rogene Houston, MD;  [facility];  Service: Endoscopy;;  . TONSILLECTOMY    . TRANSMETATARSAL AMPUTATION Left 07/25/2018   Procedure: LEFT GREAT TOE AMPUTATION;  Surgeon: Marty Heck, MD;  Location: [place];  Service: Vascular;  Laterality: Left;  . TRANSMETATARSAL AMPUTATION Left 07/30/2018   Procedure: TRANSMETATARSAL AMPUTATION;  Surgeon: Waynetta Sandy, MD;  Location: [place];  Service: Vascular;  Laterality: Left;    Social History:  reports that he has been smoking cigarettes. He has been smoking about 0.25 packs per day. He quit smokeless tobacco use about 31 years ago.  His smokeless tobacco use included snuff. He reports previous alcohol use. He reports that he does not use drugs.   Allergies  Allergen Reactions  . Tea Itching and Rash    Family History  Problem Relation Age of Onset  . Alcoholism Mother   . Alcoholism Father   . Cervical cancer Sister        dx. late 61s  . Cervical cancer Sister        dx. late 30s    Prior to Admission medications   Medication Sig Start Date End Date Taking? Authorizing Provider  Accu-Chek Softclix Lancets lancets 1 each by Other route in the morning, at noon, in the evening, and at bedtime. Dx: E11.65    [provider]  acetaminophen (TYLENOL) 500 MG tablet Take 500-1,000 mg by mouth every 6 (six) hours as needed (for pain).     [provider]  bisacodyl (DULCOLAX) 5 MG EC tablet  Take 5 mg by mouth daily as needed for moderate constipation.     [provider]  blood glucose meter kit and supplies KIT Dispense based on patient and insurance preference. Use up to four times daily as directed. (FOR ICD-9 250.00, 250.01). 07/08/20   Derek Jack, MD  Blood Glucose Monitoring Suppl (ACCU-CHEK GUIDE ME) w/Device KIT 1 Piece by Does not apply route as directed. 07/22/20   Cassandria Anger, MD  dexamethasone (DECADRON) 10 MG/ML injection Inject 10 mg into the vein every 14 (fourteen) days. Dexamethasone 10mg  in NS 50 ml IVPB prior to chemotherapy administration  05/26/20   [provider]  fluorouracil CALGB 70962 in sodium chloride 0.9 % 150 mL Inject 2,400 mg/m2 into the vein over 48 hr.  05/26/20   [provider]  fosaprepitant (EMEND) 150 MG SOLR injection Inject 150 mg into the vein every 14 (fourteen) days. Prior to chemotherapy administration  05/26/20   [provider]  glucose blood (ACCU-CHEK GUIDE) test strip Test BG 4  x daily. DX E11.65 07/22/20   Cassandria Anger, MD  insulin degludec (TRESIBA FLEXTOUCH) 100 UNIT/ML FlexTouch Pen Inject 25 Units into the skin at bedtime. 08/02/20   Brita Romp, NP  Insulin Pen Needle 32G X 4 MM MISC Use to inject insulin 08/02/20   Brita Romp, NP  IRINOTECAN HCL IV Inject 280 mg/m2 into the vein every 14 (fourteen) days.  05/26/20   [provider]  LEUCOVORIN CALCIUM IV Inject 400 mg/m2 into the vein every 14 (fourteen) days.  05/26/20   [provider]  lidocaine-prilocaine (EMLA) cream Apply a small amount to port a cath site and cover with plastic wrap 1 hour prior to chemotherapy appointments 05/25/20   Derek Jack, MD  lipase/protease/amylase (CREON) 36000 UNITS CPEP capsule Take 1 capsule (36,000 Units total) by mouth 3 (three) times daily with meals AND 1 capsule (36,000 Units total) with snacks. 07/22/20   Cassandria Anger, MD  metFORMIN  (GLUCOPHAGE) 500 MG tablet Take 1,000 mg by mouth 2 (two) times daily. 07/22/20   [provider]  OXALIPLATIN IV Inject 85 mg/m2 into the vein every 14 (fourteen) days.  05/26/20   [provider]  palonosetron (ALOXI) 0.25 MG/5ML injection Inject 0.25 mg into the vein every 14 (fourteen) days. Prior to chemotherapy administration  05/26/20   [provider]  potassium chloride SA (KLOR-CON) 20 MEQ tablet Take 1 tablet (20 mEq total) by mouth 2 (two) times daily. 07/30/20   Jacquelin Hawking, NP  prochlorperazine (COMPAZINE) 10 MG tablet Take 1 tablet (10 mg total) by mouth every 6 (six) hours as needed for nausea or vomiting. 06/23/20   Derek Jack, MD  tamsulosin (FLOMAX) 0.4 MG CAPS capsule Take 1 capsule (0.4 mg total) by mouth at bedtime. 07/30/20   Jacquelin Hawking, NP    Physical Exam: BP (!) 123/96   Pulse 99   Temp (!) 101.3 F (38.5 C)   Resp (!) 21   Ht $R'5\' 11"'HZ$  (1.803 m)   Wt 62 kg   SpO2 100%   BMI 19.06 kg/m   . General: 69 y.o. year-old male well developed well nourished in no acute distress.  Alert and oriented x3. Marland Kitchen HEENT: NCAT . Neck: Supple, trachea medial . Cardiovascular: Regular rate and rhythm with no rubs or gallops.  No thyromegaly or JVD noted.  No lower extremity edema. 2/4 pulses in all 4 extremities. Marland Kitchen Respiratory: Coarse breath sounds diffusely.  No wheezes. . Abdomen: Soft nontender nondistended with normal bowel sounds x4 quadrants. . Muskuloskeletal: Port-A-Cath in place.  Left BKA.   Marland Kitchen Neuro: CN II-XII intact, strength, sensation, reflexes . Skin: No ulcerative lesions noted or rashes . Psychiatry: Judgement and insight appear normal. Mood is appropriate for condition and setting          Labs on Admission:  Basic Metabolic Panel: Recent Labs  Lab 09/07/20 0802 09/07/20 1145 09/08/20 1816 09/09/20 0242 09/09/20 0627 09/11/20 0614 09/06/2020 1528  NA 132*  --  133* 138 136 135 131*  K 3.5  --  4.1 3.2* 3.2* 3.4*  3.6  CL 99  --  99 104 104 100 100  CO2 27  --  $R'25 27 27 26 25  'Dl$ GLUCOSE 529*   < > 610* 52* 110* 51* 189*  BUN 9  --  $R'14 10 11 8 8  'bu$ CREATININE 0.65  --  0.70 0.49* 0.46* 0.52* 0.41*  CALCIUM 8.6*  --  9.0 8.4* 8.1* 8.2* 8.1*  MG 1.8  --  2.0  --   --  1.6*  --   PHOS  --   --   --   --   --  2.9  --    < > = values in this interval not displayed.   Liver Function Tests: Recent Labs  Lab 09/07/20 0802 09/08/20 1816 09/11/20 0614 09/10/2020 1528  AST 31 68* 66* 71*  ALT 25 34 39 44  ALKPHOS 297* 309* 288* 276*  BILITOT 0.5 0.5 0.3 0.6  PROT 6.6 7.0 5.8* 6.2*  ALBUMIN 2.6* 2.8* 2.3* 2.6*   No results for input(s): LIPASE, AMYLASE in the last 168 hours. No results for input(s): AMMONIA in the last 168 hours. CBC: Recent Labs  Lab 09/07/20 0802 09/08/20 1816 09/10/20 0616 09/11/20 0614 08/31/2020 1528  WBC 8.0 7.8 5.2 5.6 4.9  NEUTROABS 5.5 5.8 3.4 3.6 3.9  HGB 11.0* 11.3* 9.8* 10.7* 11.1*  HCT 34.8* 34.9* 31.3* 33.6* 34.4*  MCV 94.1 93.8 94.8 92.8 93.5  PLT 150 166 136* 143* 185   Cardiac Enzymes: No results for input(s): CKTOTAL, CKMB, CKMBINDEX, TROPONINI in the last 168 hours.  BNP (last 3 results) No results for input(s): BNP in the last 8760 hours.  ProBNP (last 3 results) No results for input(s): PROBNP in the last 8760 hours.  CBG: Recent Labs  Lab 09/11/2020 2010 08/31/2020 2030 08/26/2020 2054 09/03/2020 2111 09/03/2020 2144  GLUCAP 128* 109* 88 89 77    Radiological Exams on Admission: CT Angio Head W or Wo Contrast  Result Date: 09/15/2020 CLINICAL DATA:  Found unresponsive. Recent diagnosis of pancreatic cancer. Stroke suspected. EXAM: CT ANGIOGRAPHY HEAD AND NECK TECHNIQUE: Multidetector CT imaging of the head and neck was performed using the standard protocol during bolus administration of intravenous contrast. Multiplanar CT image reconstructions and MIPs were obtained to evaluate the vascular anatomy. Carotid stenosis measurements (when applicable) are  obtained utilizing NASCET criteria, using the distal internal carotid diameter as the denominator. CONTRAST:  54mL OMNIPAQUE IOHEXOL 350 MG/ML SOLN COMPARISON:  Head CT same day FINDINGS: CTA NECK FINDINGS Aortic arch: Aortic atherosclerosis. No aneurysm or dissection. Branching pattern is normal. Mild atherosclerotic change at the brachiocephalic vessel origins. Right carotid system: Common carotid artery widely patent to the bifurcation. Calcified plaque at the carotid bifurcation and ICA bulb. No stenosis compared to the more distal cervical ICA diameter. Left carotid system: Common carotid artery is patent to the bifurcation with areas of plaque but no significant narrowing. Carotid bifurcation shows calcified plaque. ICA bulb shows calcified plaque with minimal diameter of 2.5 mm. Compared to a more distal cervical ICA diameter of 4 mm, this indicates a 40% stenosis. Vertebral arteries: 30% stenosis of the proximal right subclavian artery. 50% stenosis of the right vertebral artery origin. Advanced calcified plaque of the left subclavian artery just proximal to the vertebral artery origin with stenosis of 70%. Severe stenosis of the proximal left vertebral artery. Beyond their origins, both vertebral arteries are widely patent through the cervical region to the foramen magnum. Skeleton: Ordinary cervical spondylosis. Other neck: No mass or lymphadenopathy. Upper chest: Widespread patchy infiltrates in the left upper lobe consistent with bronchopneumonia. Underlying emphysema and pulmonary scarring. Review of the MIP images confirms the above findings CTA HEAD FINDINGS Anterior circulation: Both internal carotid arteries patent through the skull base and siphon regions. Severe stenosis in the carotid siphon regions, worse on the left than the right. Near occlusion on the left. Supraclinoid internal carotid arteries show  severe atherosclerotic narrowing, worse on the left than the right. Flow is present in both  anterior and middle cerebral arteries. There is considerable narrowing and irregularity of the more distal branch vessels, worse on the right than the left. I do not identify an acute vascular occlusion. Posterior circulation: Both vertebral arteries are patent through the foramen magnum to the basilar. No basilar stenosis. Posterior circulation branch vessels show flow. Atherosclerotic narrowing and irregularity of the PCA branches. Venous sinuses: Patent and normal. Anatomic variants: None significant. Review of the MIP images confirms the above findings IMPRESSION: 1. No acute large or medium vessel occlusion. 2. Atherosclerotic disease at both carotid bifurcations. No stenosis on the right. 40% stenosis of the left ICA bulb. 3. 50% stenosis of the right vertebral artery origin. 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 Severe stenosis of the proximal left vertebral artery. 4. Severe stenosis in both carotid siphon and supraclinoid ICA regions. 5. Severe atherosclerotic narrowing and irregularity of the more distal intracranial branch vessels diffusely. 6. Widespread patchy infiltrates in the left upper lobe consistent with bronchopneumonia. 7. Emphysema and aortic atherosclerosis. Aortic Atherosclerosis (ICD10-I70.0) and Emphysema (ICD10-J43.9). Electronically Signed   By: Paulina Fusi M.D.   On: 09/11/2020 18:20   CT Head Wo Contrast  Result Date: 09/03/2020 CLINICAL DATA:  Unresponsive, history of pancreatic cancer EXAM: CT HEAD WITHOUT CONTRAST TECHNIQUE: Contiguous axial images were obtained from the base of the skull through the vertex without intravenous contrast. COMPARISON:  None. FINDINGS: Brain: Evaluation is limited by patient motion throughout the study. Focal hypodensities in the bilateral basal ganglia consistent with chronic lacunar infarcts. No signs of acute infarct or hemorrhage. Lateral ventricles and remaining  midline structures are unremarkable. No acute extra-axial fluid collections. No mass effect. Vascular: No hyperdense vessel or unexpected calcification. Skull: Normal. Negative for fracture or focal lesion. Sinuses/Orbits: No acute finding. Other: None. IMPRESSION: 1. Limited evaluation due to patient motion. No acute intracranial process. Electronically Signed   By: Sharlet Salina M.D.   On: 09/01/2020 16:41   CT Angio Neck W and/or Wo Contrast  Result Date: 09/04/2020 CLINICAL DATA:  Found unresponsive. Recent diagnosis of pancreatic cancer. Stroke suspected. EXAM: CT ANGIOGRAPHY HEAD AND NECK TECHNIQUE: Multidetector CT imaging of the head and neck was performed using the standard protocol during bolus administration of intravenous contrast. Multiplanar CT image reconstructions and MIPs were obtained to evaluate the vascular anatomy. Carotid stenosis measurements (when applicable) are obtained utilizing NASCET criteria, using the distal internal carotid diameter as the denominator. CONTRAST:  64mL OMNIPAQUE IOHEXOL 350 MG/ML SOLN COMPARISON:  Head CT same day FINDINGS: CTA NECK FINDINGS Aortic arch: Aortic atherosclerosis. No aneurysm or dissection. Branching pattern is normal. Mild atherosclerotic change at the brachiocephalic vessel origins. Right carotid system: Common carotid artery widely patent to the bifurcation. Calcified plaque at the carotid bifurcation and ICA bulb. No stenosis compared to the more distal cervical ICA diameter. Left carotid system: Common carotid artery is patent to the bifurcation with areas of plaque but no significant narrowing. Carotid bifurcation shows calcified plaque. ICA bulb shows calcified plaque with minimal diameter of 2.5 mm. Compared to a more distal cervical ICA diameter of 4 mm, this indicates a 40% stenosis. Vertebral arteries: 30% stenosis of the proximal right subclavian artery. 50% stenosis of the right vertebral artery origin. Advanced calcified plaque of the  left subclavian artery just proximal to the vertebral artery origin with stenosis  of 70%. Severe stenosis of the proximal left vertebral artery. Beyond their origins, both vertebral arteries are widely patent through the cervical region to the foramen magnum. Skeleton: Ordinary cervical spondylosis. Other neck: No mass or lymphadenopathy. Upper chest: Widespread patchy infiltrates in the left upper lobe consistent with bronchopneumonia. Underlying emphysema and pulmonary scarring. Review of the MIP images confirms the above findings CTA HEAD FINDINGS Anterior circulation: Both internal carotid arteries patent through the skull base and siphon regions. Severe stenosis in the carotid siphon regions, worse on the left than the right. Near occlusion on the left. Supraclinoid internal carotid arteries show severe atherosclerotic narrowing, worse on the left than the right. Flow is present in both anterior and middle cerebral arteries. There is considerable narrowing and irregularity of the more distal branch vessels, worse on the right than the left. I do not identify an acute vascular occlusion. Posterior circulation: Both vertebral arteries are patent through the foramen magnum to the basilar. No basilar stenosis. Posterior circulation branch vessels show flow. Atherosclerotic narrowing and irregularity of the PCA branches. Venous sinuses: Patent and normal. Anatomic variants: None significant. Review of the MIP images confirms the above findings IMPRESSION: 1. No acute large or medium vessel occlusion. 2. Atherosclerotic disease at both carotid bifurcations. No stenosis on the right. 40% stenosis of the left ICA bulb. 3. 50% stenosis of the right vertebral artery origin. 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 Severe stenosis of the proximal left vertebral artery. 4. Severe stenosis in both carotid siphon and supraclinoid ICA regions. 5.  Severe atherosclerotic narrowing and irregularity of the more distal intracranial branch vessels diffusely. 6. Widespread patchy infiltrates in the left upper lobe consistent with bronchopneumonia. 7. Emphysema and aortic atherosclerosis. Aortic Atherosclerosis (ICD10-I70.0) and Emphysema (ICD10-J43.9). Electronically Signed   By: Nelson Chimes M.D.   On: 08/29/2020 18:20   DG Chest Portable 1 View  Result Date: 09/05/2020 CLINICAL DATA:  Post intubation EXAM: PORTABLE CHEST 1 VIEW COMPARISON:  September 12, 2020 FINDINGS: The left subclavian Port-A-Cath is unchanged. The endotracheal tube terminates above the carina by approximately 4.4 cm. The enteric tube extends below the left hemidiaphragm. There is no pneumothorax. Bilateral hazy airspace opacities are noted. There is no large pleural effusion. IMPRESSION: 1. Lines and tubes as above.  No pneumothorax. 2. Hazy bilateral airspace opacities which may represent multifocal pneumonia in the appropriate clinical setting. Electronically Signed   By: Constance Holster M.D.   On: 09/22/2020 20:32   DG Chest Port 1 View  Result Date: 09/15/2020 CLINICAL DATA:  Sepsis, unresponsive, history of pancreatic cancer EXAM: PORTABLE CHEST 1 VIEW COMPARISON:  05/21/2020 FINDINGS: Single frontal view of the chest demonstrates an unremarkable cardiac silhouette. Left chest wall port unchanged. Chronic areas of scarring are seen throughout the lungs, stable. No effusion or pneumothorax. There is a prominent skin fold overlying the left chest. No acute bony abnormalities. IMPRESSION: 1. Chronic areas of scarring.  No acute intrathoracic process. 2. Prominent skin fold overlying left lung.  No pneumothorax. Electronically Signed   By: Randa Ngo M.D.   On: 09/11/2020 16:38    EKG: I independently viewed the EKG done and my findings are as followed: Sinus tachycardia at rate of 103 bpm with QTc 562ms  Assessment/Plan Present on Admission: . Acute metabolic  encephalopathy . Pancreatic cancer metastasized to liver ([place]) . Essential hypertension  Active Problems:   Essential hypertension   Pancreatic cancer  metastasized to liver [facility])   Port-A-Cath in place   Acute metabolic encephalopathy   Hypoglycemia   Multifocal pneumonia   Hypoalbuminemia   Lactic acidosis   Elevated liver enzymes   Prolonged QT interval   Acute metabolic encephalopathy possibly secondary to hypoglycemia requiring IPPV for airway protection Patient was recently discharged from the hospital yesterday (12/18) due to DKA, it is possible that patient patient took insulin at home without adequate diet. Several dextrose 50% have been given, continue IV D10W Stress dose steroid (Solu-Cortef 100 mg x 1) will be given due to patient's recurrent hypoglycemia and considering history of high-dose dexamethasone administration every 2 weeks with chemotherapy Continue serial CBGs to ensure normal glucose level Continue IV fentanyl for sedation  Sepsis secondary to presumed multifocal pneumonia Chest x-ray was suggestive of multifocal pneumonia  ABG: pH 7.494, PCO2 21.7, PO2 136, bicarb 24.8, FiO2 40% Patient was febrile, tachycardic and tachypneic (met SIRS criteria), source of infection was lung, multifocal pneumonia), lactic acid was 2.5 IV hydration per sepsis protocol was given Patient was started on IV vancomycin, cefepime and doxycycline, we shall continue with same at this time with plan to de-escalate/discontinue based on procalcitonin, blood culture, strep pneumo, urine Legionella and sputum culture  Lactic acidosis Lactic acid 2.5, continue to trend lactic acid  Prolonged QTc (570 ms) Avoid QT prolonging drugs Magnesium level will be checked  Elevated liver enzymes AST 71, ALT 44, ALP 276 Patient was reported to have history of alcohol use Continue to monitor liver enzymes  Hypoalbuminemia possibly secondary to moderate protein calorie malnutrition Albumin  2.6, consider protein supplement when patient is extubated Consider dietitian consult if patient's intubation will be prolonged  Essential hypertension BP meds will be held at this time due to soft BP  Pancreatic cancer metastasized to liver Patient on palliative chemotherapy as an outpatient with Dr. Alfonzo Feller, palonosetron 5-FU Emend Continue Creon when patient is extubated   DVT prophylaxis: Lovenox  Code Status: Full code  Family Communication: Daughter at bedside (all questions answered to satisfaction)  Disposition Plan:  Patient is from:                        home Anticipated DC to:                   SNF or family members home Anticipated DC date:               2-3 days Anticipated DC barriers:           Patient is unstable to be discharged at this time due to being intubated and sedated due to inability to protect airway in the presence of recurrent hypoglycemia and presumed multifocal pneumonia that requires inpatient management   Consults called: PCCM  Admission status: Inpatient    Bernadette Hoit MD [facility]  08/29/2020, 10:07 PM

## 2020-09-12 NOTE — ED Provider Notes (Signed)
[facility] EMERGENCY DEPARTMENT Provider Note   CSN: [id number] Arrival date & time: 08/25/2020  1455     History Chief Complaint  Patient presents with  . unresponsive    John Royce Sr. is a 69 y.o. male.  Patient has a history of metastatic pancreatic cancer and diabetes.  He was admitted to the hospital recently for DKA and was discharged yesterday.  His wife saw him normal at 4 AM this morning and then last tried to wake him up at 2 PM but was unresponsive.  Paramedics arrived and the patient's blood glucose was in the 30s.  He gave amp of D50 but it was still 31 when he arrived in the emergency department  The history is provided by the EMS personnel. No language interpreter was used.  Altered Mental Status Presenting symptoms: behavior changes   Severity:  Severe Most recent episode:  Today Episode history:  Single Timing:  Constant Progression:  Unchanged Chronicity:  New Context: alcohol use   Associated symptoms: no abdominal pain        Past Medical History:  Diagnosis Date  . Acute blood loss anemia   . Arthritis   . Benign essential HTN   . Cancer [facility])    pancreatic  . Cellulitis 07/20/2018  . Cellulitis in diabetic foot ([place]) 07/20/2018  . Diabetes mellitus without complication ([place])   . Diabetes mellitus, type II ([place]) 07/20/2018  . Dry gangrene ([place]) 07/20/2018  . Emesis   . Family history of cervical cancer   . Heart murmur   . Hyperglycemia   . Hypertension   . Joint pain   . Port-A-Cath in place 05/25/2020  . Unilateral complete BKA, left, initial encounter [facility])     Patient Active Problem List   Diagnosis Date Noted  . Acute metabolic encephalopathy 62/95/2841  . Mouth sore secondary to chemotherapy 07/28/2020  . 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 Genetic testing 06/30/2020  . Dehydration 06/09/2020  . Port-A-Cath in place 05/25/2020  . Pancreatic cancer  metastasized to liver ([place])   . Goals of care, counseling/discussion 05/11/2020  . Family history of cervical cancer   . Pain of upper abdomen   . Hyperglycemia   . Pancreatic mass 04/24/2020  . Renal mass 04/24/2020  . Type 2 diabetes mellitus with peripheral neuropathy (H[facility])   . Essential hypertension   . 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 Dyslipidemia   . Current smoker   . PAD (peripheral artery disease) ([place]) 07/20/2018    Past Surgical History:  Procedure Laterality Date  . AMPUTATION Left 08/02/2018   Procedure: LEFT AMPUTATION BELOW KNEE;  Surgeon: Marty Heck, MD;  Location: [place];  Service: Vascular;  Laterality: Left;  . BILIARY STENT PLACEMENT N/A 06/11/2020   Procedure: BILIARY STENT PLACEMENT;  Surgeon: Rogene Houston, MD;  [facility];  Service: Endoscopy;  Laterality: N/A;  . ELBOW SURGERY     Bone graft to repair elbow  . ERCP N/A 06/11/2020   Procedure: ENDOSCOPIC RETROGRADE CHOLANGIOPANCREATOGRAPHY (ERCP);  Surgeon: Rogene Houston, MD;  [facility];  Service: Endoscopy;  Laterality: N/A;  . LOWER EXTREMITY ANGIOGRAPHY N/A 07/24/2018   Procedure: LOWER EXTREMITY ANGIOGRAPHY;  Surgeon: Marty Heck, MD;  Location: [place] CV LAB;  Service: Cardiovascular;  Laterality: N/A;  . PERIPHERAL VASCULAR INTERVENTION  07/24/2018   Procedure: PERIPHERAL VASCULAR INTERVENTION;  Surgeon: Marty Heck, MD;  Location:  [place] INVASIVE CV LAB;  Service: Cardiovascular;;  . PORTACATH PLACEMENT Left 05/21/2020   Procedure: INSERTION PORT-A-CATH;  Surgeon: Aviva Signs, MD;  Location: [facility] ORS;  Service: General;  Laterality: Left;  . SPHINCTEROTOMY  06/11/2020   Procedure: SHORT SPHINCTEROTOMY;  Surgeon: Rogene Houston, MD;  [facility];  Service: Endoscopy;;  . TONSILLECTOMY    . TRANSMETATARSAL AMPUTATION Left 07/25/2018   Procedure: LEFT GREAT TOE AMPUTATION;  Surgeon: Marty Heck, MD;  Location: [place];  Service: Vascular;  Laterality: Left;  . TRANSMETATARSAL AMPUTATION Left 07/30/2018   Procedure: TRANSMETATARSAL AMPUTATION;  Surgeon: Waynetta Sandy, MD;  Location: [facility] OR;  Service: Vascular;  Laterality: Left;       Family History  Problem Relation Age of Onset  . Alcoholism Mother   . Alcoholism Father   . Cervical cancer Sister        dx. late 75s  . Cervical cancer Sister        dx. late 42s    Social History   Tobacco Use  . Smoking status: Current Every Day Smoker    Packs/day: 0.25    Types: Cigarettes  . Smokeless tobacco: Former Systems developer    Types: Snuff    Quit date: 04/21/1989  Vaping Use  . Vaping Use: Never used  Substance Use Topics  . Alcohol use: Not Currently    Alcohol/week: 0.0 standard drinks    Comment: stopped 2 years ago  . Drug use: No    Home Medications Prior to Admission medications   Medication Sig Start Date End Date Taking? Authorizing Provider  Accu-Chek Softclix Lancets lancets 1 each by Other route in the morning, at noon, in the evening, and at bedtime. Dx: E11.65    [provider]  acetaminophen (TYLENOL) 500 MG tablet Take 500-1,000 mg by mouth every 6 (six) hours as needed (for pain).     [provider]  bisacodyl (DULCOLAX) 5 MG EC tablet Take 5 mg by mouth daily as needed for moderate constipation.     [provider]  blood glucose meter kit and supplies KIT Dispense based on patient and insurance preference. Use up to four times daily as directed. (FOR ICD-9 250.00, 250.01). 07/08/20   Derek Jack, MD  Blood Glucose Monitoring Suppl (ACCU-CHEK GUIDE ME) w/Device KIT 1 Piece by Does not apply route as directed. 07/22/20   Cassandria Anger, MD  dexamethasone (DECADRON) 10 MG/ML injection Inject 10 mg into the vein every 14 (fourteen) days. Dexamethasone 60m in NS 50 ml IVPB prior to chemotherapy administration  05/26/20   [provider]  fluorouracil CALGB 816073in sodium  chloride 0.9 % 150 mL Inject 2,400 mg/m2 into the vein over 48 hr.  05/26/20   [provider]  fosaprepitant (EMEND) 150 MG SOLR injection Inject 150 mg into the vein every 14 (fourteen) days. Prior to chemotherapy administration  05/26/20   [provider]  glucose blood (ACCU-CHEK GUIDE) test strip Test BG 4 x daily. DX E11.65 07/22/20   NCassandria Anger MD  insulin degludec (TRESIBA FLEXTOUCH) 100 UNIT/ML FlexTouch Pen Inject 25 Units into the skin at bedtime. 08/02/20   RBrita Romp NP  Insulin Pen Needle 32G X 4 MM MISC Use to inject insulin 08/02/20   RBrita Romp NP  IRINOTECAN HCL IV Inject 280 mg/m2 into the vein every 14 (fourteen) days.  05/26/20   [provider]  LEUCOVORIN CALCIUM IV Inject 400 mg/m2 into the  vein every 14 (fourteen) days.  05/26/20   [provider]  lidocaine-prilocaine (EMLA) cream Apply a small amount to port a cath site and cover with plastic wrap 1 hour prior to chemotherapy appointments 05/25/20   Derek Jack, MD  lipase/protease/amylase (CREON) 36000 UNITS CPEP capsule Take 1 capsule (36,000 Units total) by mouth 3 (three) times daily with meals AND 1 capsule (36,000 Units total) with snacks. 07/22/20   Cassandria Anger, MD  metFORMIN (GLUCOPHAGE) 500 MG tablet Take 1,000 mg by mouth 2 (two) times daily. 07/22/20   [provider]  OXALIPLATIN IV Inject 85 mg/m2 into the vein every 14 (fourteen) days.  05/26/20   [provider]  palonosetron (ALOXI) 0.25 MG/5ML injection Inject 0.25 mg into the vein every 14 (fourteen) days. Prior to chemotherapy administration  05/26/20   [provider]  potassium chloride SA (KLOR-CON) 20 MEQ tablet Take 1 tablet (20 mEq total) by mouth 2 (two) times daily. 07/30/20   Jacquelin Hawking, NP  prochlorperazine (COMPAZINE) 10 MG tablet Take 1 tablet (10 mg total) by mouth every 6 (six) hours as needed for nausea or vomiting. 06/23/20   Derek Jack, MD  tamsulosin (FLOMAX) 0.4 MG CAPS capsule Take 1 capsule (0.4 mg total) by mouth at bedtime. 07/30/20   Jacquelin Hawking, NP    Allergies    Tea  Review of Systems   Review of Systems  Unable to perform ROS: Mental status change  Gastrointestinal: Negative for abdominal pain.    Physical Exam Updated Vital Signs BP 105/77   Pulse 95   Temp (!) 101.84 F (38.8 C)   Resp 20   Ht _0  (1.803 m)   Wt 62 kg   SpO2 99%   BMI 19.06 kg/m   Physical Exam Vitals and nursing note reviewed.  Constitutional:      Appearance: He is well-developed.     Comments: Unresponsive to verbal stimuli and painful stimuli  HENT:     Head: Normocephalic.     Right Ear: Tympanic membrane normal.     Nose: Nose normal.     Mouth/Throat:     Mouth: Mucous membranes are moist.  Eyes:     General: No scleral icterus.    Extraocular Movements: EOM normal.     Conjunctiva/sclera: Conjunctivae normal.  Neck:     Thyroid: No thyromegaly.  Cardiovascular:     Rate and Rhythm: Normal rate and regular rhythm.     Heart sounds: No murmur heard. No friction rub. No gallop.   Pulmonary:     Breath sounds: No stridor. Rales present. No wheezing.  Chest:     Chest wall: No tenderness.  Abdominal:     General: There is no distension.     Tenderness: There is no abdominal tenderness. There is no rebound.  Musculoskeletal:        General: No edema. Normal range of motion.     Cervical back: Neck supple.  Lymphadenopathy:     Cervical: No cervical adenopathy.  Skin:    General: Skin is warm.     Capillary Refill: Capillary refill takes less than 2 seconds.     Findings: No erythema or rash.  Neurological:     Motor: No abnormal muscle tone.     Coordination: Coordination normal.  Psychiatric:        Mood and Affect: Mood and affect normal.     ED Results / Procedures / Treatments   Labs (all  labs ordered are listed, but only abnormal results are displayed) Labs Reviewed   LACTIC ACID, PLASMA - Abnormal; Notable for the following components:      Result Value   Lactic Acid, Venous 2.5 (*)    All other components within normal limits  COMPREHENSIVE METABOLIC PANEL - Abnormal; Notable for the following components:   Sodium 131 (*)    Glucose, Bld 189 (*)    Creatinine, Ser 0.41 (*)    Calcium 8.1 (*)    Total Protein 6.2 (*)    Albumin 2.6 (*)    AST 71 (*)    Alkaline Phosphatase 276 (*)    All other components within normal limits  CBC WITH DIFFERENTIAL/PLATELET - Abnormal; Notable for the following components:   RBC 3.68 (*)    Hemoglobin 11.1 (*)    HCT 34.4 (*)    RDW 17.5 (*)    Abs Immature Granulocytes 0.09 (*)    All other components within normal limits  URINALYSIS, ROUTINE W REFLEX MICROSCOPIC - Abnormal; Notable for the following components:   Glucose, UA 50 (*)    All other components within normal limits  CBG MONITORING, ED - Abnormal; Notable for the following components:   Glucose-Capillary 31 (*)    All other components within normal limits  CBG MONITORING, ED - Abnormal; Notable for the following components:   Glucose-Capillary 103 (*)    All other components within normal limits  CBG MONITORING, ED - Abnormal; Notable for the following components:   Glucose-Capillary 69 (*)    All other components within normal limits  CBG MONITORING, ED - Abnormal; Notable for the following components:   Glucose-Capillary 25 (*)    All other components within normal limits  CBG MONITORING, ED - Abnormal; Notable for the following components:   Glucose-Capillary 105 (*)    All other components within normal limits  CBG MONITORING, ED - Abnormal; Notable for the following components:   Glucose-Capillary 62 (*)    All other components within normal limits  CBG MONITORING, ED - Abnormal; Notable for the following components:   Glucose-Capillary 128 (*)    All other components within normal limits  CBG MONITORING, ED - Abnormal; Notable for  the following components:   Glucose-Capillary 109 (*)    All other components within normal limits  CULTURE, BLOOD (ROUTINE X 2)  CULTURE, BLOOD (ROUTINE X 2)  RESP PANEL BY RT-PCR (FLU A&B, COVID) ARPGX2  URINE CULTURE  MRSA PCR SCREENING  LACTIC ACID, PLASMA  PROTIME-INR  APTT  CREATININE, SERUM  CBG MONITORING, ED  CBG MONITORING, ED  CBG MONITORING, ED  CBG MONITORING, ED  CBG MONITORING, ED    EKG None  Radiology CT Angio Head W or Wo Contrast  Result Date: 09/19/2020 CLINICAL DATA:  Found unresponsive. Recent diagnosis of pancreatic cancer. Stroke suspected. EXAM: CT ANGIOGRAPHY HEAD AND NECK TECHNIQUE: Multidetector CT imaging of the head and neck was performed using the standard protocol during bolus administration of intravenous contrast. Multiplanar CT image reconstructions and MIPs were obtained to evaluate the vascular anatomy. Carotid stenosis measurements (when applicable) are obtained utilizing NASCET criteria, using the distal internal carotid diameter as the denominator. CONTRAST:  44m OMNIPAQUE IOHEXOL 350 MG/ML SOLN COMPARISON:  Head CT same day FINDINGS: CTA NECK FINDINGS Aortic arch: Aortic atherosclerosis. No aneurysm or dissection. Branching pattern is normal. Mild atherosclerotic change at the brachiocephalic vessel origins. Right carotid system: Common carotid artery widely patent to the bifurcation. Calcified plaque at the carotid  bifurcation and ICA bulb. No stenosis compared to the more distal cervical ICA diameter. Left carotid system: Common carotid artery is patent to the bifurcation with areas of plaque but no significant narrowing. Carotid bifurcation shows calcified plaque. ICA bulb shows calcified plaque with minimal diameter of 2.5 mm. Compared to a more distal cervical ICA diameter of 4 mm, this indicates a 40% stenosis. Vertebral arteries: 30% stenosis of the proximal right subclavian artery. 50% stenosis of the right vertebral artery origin. Advanced  calcified plaque of the left subclavian artery just proximal to the vertebral artery origin with stenosis of 70%. Severe stenosis of the proximal left vertebral artery. Beyond their origins, both vertebral arteries are widely patent through the cervical region to the foramen magnum. Skeleton: Ordinary cervical spondylosis. Other neck: No mass or lymphadenopathy. Upper chest: Widespread patchy infiltrates in the left upper lobe consistent with bronchopneumonia. Underlying emphysema and pulmonary scarring. Review of the MIP images confirms the above findings CTA HEAD FINDINGS Anterior circulation: Both internal carotid arteries patent through the skull base and siphon regions. Severe stenosis in the carotid siphon regions, worse on the left than the right. Near occlusion on the left. Supraclinoid internal carotid arteries show severe atherosclerotic narrowing, worse on the left than the right. Flow is present in both anterior and middle cerebral arteries. There is considerable narrowing and irregularity of the more distal branch vessels, worse on the right than the left. I do not identify an acute vascular occlusion. Posterior circulation: Both vertebral arteries are patent through the foramen magnum to the basilar. No basilar stenosis. Posterior circulation branch vessels show flow. Atherosclerotic narrowing and irregularity of the PCA branches. Venous sinuses: Patent and normal. Anatomic variants: None significant. Review of the MIP images confirms the above findings IMPRESSION: 1. No acute large or medium vessel occlusion. 2. Atherosclerotic disease at both carotid bifurcations. No stenosis on the right. 40% stenosis of the left ICA bulb. 3. 50% stenosis of the right vertebral artery origin. 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 Severe stenosis of the proximal left vertebral artery. 4. Severe stenosis in both carotid siphon and  supraclinoid ICA regions. 5. Severe atherosclerotic narrowing and irregularity of the more distal intracranial branch vessels diffusely. 6. Widespread patchy infiltrates in the left upper lobe consistent with bronchopneumonia. 7. Emphysema and aortic atherosclerosis. Aortic Atherosclerosis (ICD10-I70.0) and Emphysema (ICD10-J43.9). Electronically Signed   By: Nelson Chimes M.D.   On: 09/22/2020 18:20   CT Head Wo Contrast  Result Date: 09/07/2020 CLINICAL DATA:  Unresponsive, history of pancreatic cancer EXAM: CT HEAD WITHOUT CONTRAST TECHNIQUE: Contiguous axial images were obtained from the base of the skull through the vertex without intravenous contrast. COMPARISON:  None. FINDINGS: Brain: Evaluation is limited by patient motion throughout the study. Focal hypodensities in the bilateral basal ganglia consistent with chronic lacunar infarcts. No signs of acute infarct or hemorrhage. Lateral ventricles and remaining midline structures are unremarkable. No acute extra-axial fluid collections. No mass effect. Vascular: No hyperdense vessel or unexpected calcification. Skull: Normal. Negative for fracture or focal lesion. Sinuses/Orbits: No acute finding. Other: None. IMPRESSION: 1. Limited evaluation due to patient motion. No acute intracranial process. Electronically Signed   By: Randa Ngo M.D.   On: 09/16/2020 16:41   CT Angio Neck W and/or Wo Contrast  Result Date: 09/23/2020 CLINICAL DATA:  Found unresponsive. Recent diagnosis of pancreatic cancer. Stroke suspected. EXAM: CT ANGIOGRAPHY HEAD AND NECK TECHNIQUE: Multidetector CT  imaging of the head and neck was performed using the standard protocol during bolus administration of intravenous contrast. Multiplanar CT image reconstructions and MIPs were obtained to evaluate the vascular anatomy. Carotid stenosis measurements (when applicable) are obtained utilizing NASCET criteria, using the distal internal carotid diameter as the denominator. CONTRAST:   14m OMNIPAQUE IOHEXOL 350 MG/ML SOLN COMPARISON:  Head CT same day FINDINGS: CTA NECK FINDINGS Aortic arch: Aortic atherosclerosis. No aneurysm or dissection. Branching pattern is normal. Mild atherosclerotic change at the brachiocephalic vessel origins. Right carotid system: Common carotid artery widely patent to the bifurcation. Calcified plaque at the carotid bifurcation and ICA bulb. No stenosis compared to the more distal cervical ICA diameter. Left carotid system: Common carotid artery is patent to the bifurcation with areas of plaque but no significant narrowing. Carotid bifurcation shows calcified plaque. ICA bulb shows calcified plaque with minimal diameter of 2.5 mm. Compared to a more distal cervical ICA diameter of 4 mm, this indicates a 40% stenosis. Vertebral arteries: 30% stenosis of the proximal right subclavian artery. 50% stenosis of the right vertebral artery origin. Advanced calcified plaque of the left subclavian artery just proximal to the vertebral artery origin with stenosis of 70%. Severe stenosis of the proximal left vertebral artery. Beyond their origins, both vertebral arteries are widely patent through the cervical region to the foramen magnum. Skeleton: Ordinary cervical spondylosis. Other neck: No mass or lymphadenopathy. Upper chest: Widespread patchy infiltrates in the left upper lobe consistent with bronchopneumonia. Underlying emphysema and pulmonary scarring. Review of the MIP images confirms the above findings CTA HEAD FINDINGS Anterior circulation: Both internal carotid arteries patent through the skull base and siphon regions. Severe stenosis in the carotid siphon regions, worse on the left than the right. Near occlusion on the left. Supraclinoid internal carotid arteries show severe atherosclerotic narrowing, worse on the left than the right. Flow is present in both anterior and middle cerebral arteries. There is considerable narrowing and irregularity of the more distal  branch vessels, worse on the right than the left. I do not identify an acute vascular occlusion. Posterior circulation: Both vertebral arteries are patent through the foramen magnum to the basilar. No basilar stenosis. Posterior circulation branch vessels show flow. Atherosclerotic narrowing and irregularity of the PCA branches. Venous sinuses: Patent and normal. Anatomic variants: None significant. Review of the MIP images confirms the above findings IMPRESSION: 1. No acute large or medium vessel occlusion. 2. Atherosclerotic disease at both carotid bifurcations. No stenosis on the right. 40% stenosis of the left ICA bulb. 3. 50% stenosis of the right vertebral artery origin. 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 Severe stenosis of the proximal left vertebral artery. 4. Severe stenosis in both carotid siphon and supraclinoid ICA regions. 5. Severe atherosclerotic narrowing and irregularity of the more distal intracranial branch vessels diffusely. 6. Widespread patchy infiltrates in the left upper lobe consistent with bronchopneumonia. 7. Emphysema and aortic atherosclerosis. Aortic Atherosclerosis (ICD10-I70.0) and Emphysema (ICD10-J43.9). Electronically Signed   By: MNelson ChimesM.D.   On: 09/13/2020 18:20   DG Chest Portable 1 View  Result Date: 09/16/2020 CLINICAL DATA:  Post intubation EXAM: PORTABLE CHEST 1 VIEW COMPARISON:  September 12, 2020 FINDINGS: The left subclavian Port-A-Cath is unchanged. The endotracheal tube terminates above the carina by approximately 4.4 cm. The enteric tube extends below the left hemidiaphragm. There is no pneumothorax. Bilateral hazy airspace opacities are noted. There is no large pleural  effusion. IMPRESSION: 1. Lines and tubes as above.  No pneumothorax. 2. Hazy bilateral airspace opacities which may represent multifocal pneumonia in the appropriate clinical setting. Electronically Signed    By: Constance Holster M.D.   On: 08/28/2020 20:32   DG Chest Port 1 View  Result Date: 09/08/2020 CLINICAL DATA:  Sepsis, unresponsive, history of pancreatic cancer EXAM: PORTABLE CHEST 1 VIEW COMPARISON:  05/21/2020 FINDINGS: Single frontal view of the chest demonstrates an unremarkable cardiac silhouette. Left chest wall port unchanged. Chronic areas of scarring are seen throughout the lungs, stable. No effusion or pneumothorax. There is a prominent skin fold overlying the left chest. No acute bony abnormalities. IMPRESSION: 1. Chronic areas of scarring.  No acute intrathoracic process. 2. Prominent skin fold overlying left lung.  No pneumothorax. Electronically Signed   By: Randa Ngo M.D.   On: 09/05/2020 16:38    Procedures Procedure Name: Intubation Date/Time: 09/07/2020 9:12 PM Performed by: Milton Ferguson, MD Pre-anesthesia Checklist: Patient identified Oxygen Delivery Method: Ambu bag Preoxygenation: Pre-oxygenation with 100% oxygen Induction Type: IV induction Comments: Patient was given etomidate and succinylcholine.  Patient was intubated with 7-1/2 tube using glide scope.  Minor difficulties during the intubation.      (including critical care time)  Medications Ordered in ED Medications  ceFEPIme (MAXIPIME) 2 g in sodium chloride 0.9 % 100 mL IVPB (has no administration in time range)  vancomycin (VANCOREADY) IVPB 750 mg/150 mL (has no administration in time range)  fentaNYL 2567mg in NS 258m(103mml) infusion-PREMIX (10 mcg/hr Intravenous New Bag/Given 09/18/2020 2051)  doxycycline (VIBRAMYCIN) 100 mg in sodium chloride 0.9 % 250 mL IVPB (has no administration in time range)  dextrose 50 % solution 50 mL (50 mLs Intravenous Given 09/16/2020 1502)  naloxone (NARCAN) injection 2 mg (2 mg Intravenous Given by Other 09/13/2020 1519)  ceFEPIme (MAXIPIME) 2 g in sodium chloride 0.9 % 100 mL IVPB (0 g Intravenous Stopped 09/11/2020 1646)  sodium chloride 0.9 % bolus 2,000 mL  (0 mLs Intravenous Stopped 09/18/2020 1827)  acetaminophen (TYLENOL) suppository 650 mg (650 mg Rectal Given 09/03/2020 1615)  vancomycin (VANCOREADY) IVPB 1500 mg/300 mL (0 mg Intravenous Stopped 09/23/2020 1911)  iohexol (OMNIPAQUE) 350 MG/ML injection 75 mL (75 mLs Intravenous Contrast Given 08/27/2020 1739)  dextrose 50 % solution 50 mL (50 mLs Intravenous Given 09/13/2020 1826)  dextrose 10 % infusion ( Intravenous New Bag/Given 09/03/2020 1826)  etomidate (AMIDATE) injection 20 mg (20 mg Intravenous Given 09/23/2020 1949)  succinylcholine (ANECTINE) injection 100 mg (100 mg Intravenous Given 09/01/2020 1949)  dextrose 50 % solution 25 g (25 g Intravenous Given 09/17/2020 1950)  acetaminophen (TYLENOL) suppository 650 mg (650 mg Rectal Given 09/21/2020 2040)    ED Course  I have reviewed the triage vital signs and the nursing notes.  Pertinent labs & imaging results that were available during my care of the patient were reviewed by me and considered in my medical decision making (see chart for details). Patient with hypoglycemia and altered mental status.  Patient is CT of head and CT angio of the head and neck that was discussed with neurology and they agree there is no large vessel disease.  They believe that his altered mental status is most likely related to a prolonged hypoglycemic episode.  Patient has developed a mild fever and could have aspiration pneumonia.  He is covered with antibiotics.  I spoke with critical care who felt like the patient needed to stay here.  I also spoke with the patient's  wife and she stated the patient wants everything done and would prefer to be intubated.  The patient did not have a good gag reflex put off and could not protect his airway so he was intubated.   CRITICAL CARE Performed by: Milton Ferguson Total critical care time: 45 minutes Critical care time was exclusive of separately billable procedures and treating other patients. Critical care was necessary to treat or  prevent imminent or life-threatening deterioration. Critical care was time spent personally by me on the following activities: development of treatment plan with patient and/or surrogate as well as nursing, discussions with consultants, evaluation of patient's response to treatment, examination of patient, obtaining history from patient or surrogate, ordering and performing treatments and interventions, ordering and review of laboratory studies, ordering and review of radiographic studies, pulse oximetry and re-evaluation of patient's condition.  MDM Rules/Calculators/A&P                          Patient is admitted for altered mental status hypoglycemia probable aspiration pneumonia Final Clinical Impression(s) / ED Diagnoses Final diagnoses:  Altered mental status, unspecified altered mental status type    Rx / DC Orders ED Discharge Orders    None       Milton Ferguson, MD 09/19/2020 2113

## 2020-09-12 NOTE — ED Triage Notes (Signed)
Family found pt in bed to be unresponsive.  Unknown how long pt has been down.   CBG 30. d50 given by EMS. 4mg  of narcan given.

## 2020-09-12 NOTE — Plan of Care (Addendum)
Spoke to Dr. Roderic Palau and [facility] regarding Mr. Hinton. No beds in [place]. His illness can likely safely be managed at [facility] with the assistance of Elink overnight, and he will be seen tomorrow by our rounding team. PCCM consult order to be placed by admitted hospitalist at [facility]. Will request Elink to follow up on labs and ABG.  CXR, lung imaging from CTA neck reviewed- likely LUL opacity, suspicious for pneumonia. Empiric antibiotics for sepsis have been started- cefepime and vanc. Adding doxycyline for atypical coverage for CAP. Needs B-lactam continued.  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  Sepsis fluids ordered, blood cultures ordered. Follow up lactate ordered.  Full consult note tomorrow.  Julian Hy, DO 09/18/2020 9:05 PM [facility]

## 2020-09-12 NOTE — Progress Notes (Signed)
[facility] Progress Note Patient Name: John Savoca Sr. DOB: 12/26/1950 MRN: [id number]   Date of Service  09/17/2020  HPI/Events of Note  ABG on 40%/PRVC 20/TV 600/P 5 = 7.494/29.7/136.  eICU Interventions  Plan: 1. Decrease PRVC rate to 15. 2. Repeat ABG at 5 AM.     Intervention Category Major Interventions: Respiratory failure - evaluation and management;Acid-Base disturbance - evaluation and management  [name] 09/20/2020, 10:16 PM

## 2020-09-12 NOTE — ED Notes (Addendum)
20mg  etomidate 1943 100mg  succ 1943  7.5 tube in at 1947  + color change  23 @ the lip

## 2020-09-12 NOTE — Progress Notes (Addendum)
Pharmacy Antibiotic Note  John Sproles Sr. is a 69 y.o. male admitted on 09/23/2020 with pneumonia.  Pharmacy has been consulted for Vancomycin and Cefepime dosing.  Plan: Vancomycin 1500 mg IV x 1 dose. Vancomycin 750 mg IV every 12 hours. Expected AUC 504. Cefepime 2000 mg IV every 8 hours. Monitor labs, c/s, and vanco level as indicated.  Height: 5\' 11"  (180.3 cm) Weight: 62 kg (136 lb 11 oz) IBW/kg (Calculated) : 75.3  Temp (24hrs), Avg:101.1 F (38.4 C), Min:101.1 F (38.4 C), Max:101.1 F (38.4 C)  Recent Labs  Lab 09/07/20 0802 09/08/20 1816 09/09/20 0242 09/09/20 0627 09/10/20 0616 09/11/20 0614  WBC 8.0 7.8  --   --  5.2 5.6  CREATININE 0.65 0.70 0.49* 0.46*  --  0.52*    Estimated Creatinine Clearance: 77.5 mL/min (A) (by C-G formula based on SCr of 0.52 mg/dL (L)).    Allergies  Allergen Reactions  . Tea Itching and Rash    Antimicrobials this admission: Vanco 12/19 >>  Cefepime 12/19 >>    Microbiology results: 12/19 BCx: pending 12/19 UCx: pending   12/19 MRSA PCR: pending  Thank you for allowing pharmacy to be a part of this patient's care.  Ramond Craver 09/02/2020 4:00 PM

## 2020-09-12 NOTE — ED Notes (Signed)
CRITICAL VALUE ALERT  Critical Value:  Lactic acid 2.5  Date & Time Notied:  08/27/2020 1809  Provider Notified: [email or web address]  Orders Received/Actions taken: md notified

## 2020-09-13 ENCOUNTER — Encounter (HOSPITAL_COMMUNITY): Admission: RE | Admit: 2020-09-13 | Payer: Medicare Other | Source: Ambulatory Visit

## 2020-09-13 DIAGNOSIS — G9341 Metabolic encephalopathy: Secondary | ICD-10-CM

## 2020-09-13 DIAGNOSIS — A419 Sepsis, unspecified organism: Principal | ICD-10-CM

## 2020-09-13 DIAGNOSIS — E872 Acidosis: Secondary | ICD-10-CM | POA: Diagnosis not present

## 2020-09-13 DIAGNOSIS — J189 Pneumonia, unspecified organism: Secondary | ICD-10-CM | POA: Diagnosis not present

## 2020-09-13 DIAGNOSIS — C259 Malignant neoplasm of pancreas, unspecified: Secondary | ICD-10-CM | POA: Diagnosis not present

## 2020-09-13 DIAGNOSIS — J9601 Acute respiratory failure with hypoxia: Secondary | ICD-10-CM

## 2020-09-13 DIAGNOSIS — R6520 Severe sepsis without septic shock: Secondary | ICD-10-CM

## 2020-09-13 LAB — BLOOD GAS, ARTERIAL
Acid-base deficit: 0.7 mmol/L (ref 0.0–2.0)
Bicarbonate: 24.4 mmol/L (ref 20.0–28.0)
Drawn by: 22223
FIO2: 35
MECHVT: 600 mL
O2 Saturation: 99 %
PEEP: 5 cmH2O
Patient temperature: 37
RATE: 15 resp/min
pCO2 arterial: 30.5 mmHg — ABNORMAL LOW (ref 32.0–48.0)
pH, Arterial: 7.479 — ABNORMAL HIGH (ref 7.350–7.450)
pO2, Arterial: 163 mmHg — ABNORMAL HIGH (ref 83.0–108.0)

## 2020-09-13 LAB — URINE CULTURE: Culture: NO GROWTH

## 2020-09-13 LAB — MRSA PCR SCREENING: MRSA by PCR: NEGATIVE

## 2020-09-13 LAB — COMPREHENSIVE METABOLIC PANEL
ALT: 38 U/L (ref 0–44)
AST: 73 U/L — ABNORMAL HIGH (ref 15–41)
Albumin: 2.1 g/dL — ABNORMAL LOW (ref 3.5–5.0)
Alkaline Phosphatase: 232 U/L — ABNORMAL HIGH (ref 38–126)
Anion gap: 6 (ref 5–15)
BUN: 6 mg/dL — ABNORMAL LOW (ref 8–23)
CO2: 23 mmol/L (ref 22–32)
Calcium: 7.7 mg/dL — ABNORMAL LOW (ref 8.9–10.3)
Chloride: 104 mmol/L (ref 98–111)
Creatinine, Ser: 0.42 mg/dL — ABNORMAL LOW (ref 0.61–1.24)
GFR, Estimated: >60 mL/min (ref >60)
Glucose, Bld: 58 mg/dL — ABNORMAL LOW (ref 70–99)
Potassium: 2.8 mmol/L — ABNORMAL LOW (ref 3.5–5.1)
Sodium: 133 mmol/L — ABNORMAL LOW (ref 135–145)
Total Bilirubin: 0.5 mg/dL (ref 0.3–1.2)
Total Protein: 5.5 g/dL — ABNORMAL LOW (ref 6.5–8.1)

## 2020-09-13 LAB — CBC
HCT: 30.5 % — ABNORMAL LOW (ref 39.0–52.0)
Hemoglobin: 10 g/dL — ABNORMAL LOW (ref 13.0–17.0)
MCH: 30.3 pg (ref 26.0–34.0)
MCHC: 32.8 g/dL (ref 30.0–36.0)
MCV: 92.4 fL (ref 80.0–100.0)
Platelets: 135 10*3/uL — ABNORMAL LOW (ref 150–400)
RBC: 3.3 MIL/uL — ABNORMAL LOW (ref 4.22–5.81)
RDW: 17.6 % — ABNORMAL HIGH (ref 11.5–15.5)
WBC: 5.3 10*3/uL (ref 4.0–10.5)
nRBC: 0 % (ref 0.0–0.2)

## 2020-09-13 LAB — PHOSPHORUS: Phosphorus: 3 mg/dL (ref 2.5–4.6)

## 2020-09-13 LAB — LACTIC ACID, PLASMA
Lactic Acid, Venous: 2.2 mmol/L (ref 0.5–1.9)
Lactic Acid, Venous: 1.3 mmol/L (ref 0.5–1.9)

## 2020-09-13 LAB — BASIC METABOLIC PANEL
Anion gap: 7 (ref 5–15)
BUN: 7 mg/dL — ABNORMAL LOW (ref 8–23)
CO2: 22 mmol/L (ref 22–32)
Calcium: 7.6 mg/dL — ABNORMAL LOW (ref 8.9–10.3)
Chloride: 103 mmol/L (ref 98–111)
Creatinine, Ser: 0.4 mg/dL — ABNORMAL LOW (ref 0.61–1.24)
GFR, Estimated: >60 mL/min (ref >60)
Glucose, Bld: 70 mg/dL (ref 70–99)
Potassium: 2.9 mmol/L — ABNORMAL LOW (ref 3.5–5.1)
Sodium: 132 mmol/L — ABNORMAL LOW (ref 135–145)

## 2020-09-13 LAB — MAGNESIUM: Magnesium: 1.6 mg/dL — ABNORMAL LOW (ref 1.7–2.4)

## 2020-09-13 LAB — GLUCOSE, CAPILLARY
Glucose-Capillary: 75 mg/dL (ref 70–99)
Glucose-Capillary: 79 mg/dL (ref 70–99)
Glucose-Capillary: 90 mg/dL (ref 70–99)
Glucose-Capillary: 92 mg/dL (ref 70–99)
Glucose-Capillary: 92 mg/dL (ref 70–99)
Glucose-Capillary: 74 mg/dL (ref 70–99)
Glucose-Capillary: 51 mg/dL — ABNORMAL LOW (ref 70–99)

## 2020-09-13 LAB — PROTIME-INR
INR: 1.1 (ref 0.8–1.2)
Prothrombin Time: 13.3 seconds (ref 11.4–15.2)

## 2020-09-13 LAB — STREP PNEUMONIAE URINARY ANTIGEN: Strep Pneumo Urinary Antigen: NEGATIVE

## 2020-09-13 LAB — APTT: aPTT: 39 seconds — ABNORMAL HIGH (ref 24–36)

## 2020-09-13 LAB — PROCALCITONIN: Procalcitonin: 0.28 ng/mL

## 2020-09-13 MED ORDER — MAGNESIUM SULFATE 2 GM/50ML IV SOLN
2.0000 g | Freq: Once | INTRAVENOUS | Status: AC
  Administered 2020-09-13: 09:00:00 2 g via INTRAVENOUS
  Filled 2020-09-13: qty 50

## 2020-09-13 MED ORDER — ORAL CARE MOUTH RINSE
15.0000 mL | OROMUCOSAL | Status: DC
  Administered 2020-09-13 – 2020-09-17 (×37): 15 mL via OROMUCOSAL

## 2020-09-13 MED ORDER — DEXTROSE 50 % IV SOLN
INTRAVENOUS | Status: AC
  Administered 2020-09-13: 50 mL
  Filled 2020-09-13: qty 50

## 2020-09-13 MED ORDER — FENTANYL CITRATE (PF) 100 MCG/2ML IJ SOLN: 25.0000 ug | INTRAMUSCULAR | Status: DC | PRN

## 2020-09-13 MED ORDER — KCL IN DEXTROSE-NACL 40-5-0.9 MEQ/L-%-% IV SOLN: INTRAVENOUS | Status: DC

## 2020-09-13 MED ORDER — PANTOPRAZOLE SODIUM 40 MG IV SOLR
40.0000 mg | Freq: Two times a day (BID) | INTRAVENOUS | Status: DC
  Administered 2020-09-13 – 2020-09-14 (×4): 40 mg via INTRAVENOUS
  Filled 2020-09-13 (×4): qty 40

## 2020-09-13 MED ORDER — POTASSIUM CHLORIDE 10 MEQ/100ML IV SOLN
10.0000 meq | INTRAVENOUS | Status: AC
  Administered 2020-09-13 (×3): 10 meq via INTRAVENOUS
  Filled 2020-09-13 (×2): qty 100

## 2020-09-13 MED ORDER — CHLORHEXIDINE GLUCONATE 0.12% ORAL RINSE (MEDLINE KIT)
15.0000 mL | Freq: Two times a day (BID) | OROMUCOSAL | Status: DC
  Administered 2020-09-13 – 2020-09-17 (×9): 15 mL via OROMUCOSAL

## 2020-09-13 MED ORDER — CHLORHEXIDINE GLUCONATE CLOTH 2 % EX PADS
6.0000 | MEDICATED_PAD | Freq: Every day | CUTANEOUS | Status: DC
  Administered 2020-09-13 – 2020-09-17 (×3): 6 via TOPICAL

## 2020-09-13 MED ORDER — INSULIN ASPART 100 UNIT/ML ~~LOC~~ SOLN
0.0000 [IU] | SUBCUTANEOUS | Status: DC
  Administered 2020-09-14: 20:00:00 3 [IU] via SUBCUTANEOUS
  Administered 2020-09-14: 5 [IU] via SUBCUTANEOUS
  Administered 2020-09-14: 08:00:00 2 [IU] via SUBCUTANEOUS
  Administered 2020-09-15: 17:00:00 3 [IU] via SUBCUTANEOUS
  Administered 2020-09-15: 12:00:00 5 [IU] via SUBCUTANEOUS
  Administered 2020-09-15: 04:00:00 3 [IU] via SUBCUTANEOUS
  Administered 2020-09-15 – 2020-09-16 (×4): 5 [IU] via SUBCUTANEOUS
  Administered 2020-09-16: 20:00:00 3 [IU] via SUBCUTANEOUS
  Administered 2020-09-16: 12:00:00 5 [IU] via SUBCUTANEOUS
  Administered 2020-09-16: 17:00:00 2 [IU] via SUBCUTANEOUS
  Administered 2020-09-16: 08:00:00 8 [IU] via SUBCUTANEOUS
  Administered 2020-09-17: 3 [IU] via SUBCUTANEOUS

## 2020-09-13 NOTE — Progress Notes (Signed)
Pt being followed by [facility] for Sepsis protocol. 

## 2020-09-13 NOTE — Progress Notes (Signed)
PROGRESS NOTE    John Cirelli Sr.  IRS:[id number] DOB: Mar 12, 1951 DOA: 09/21/2020 PCP: Perlie Mayo, NP    Chief Complaint  Patient presents with  . unresponsive    Brief Narrative:  As per H&P written by Dr. Josephine Cables on 09/10/2020 John Islam Sr. is a 69 y.o. 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 Morey Hummingbird who presents to the emergency department via EMS due to being unresponsive at home.  Patient was recently admitted on 12/15 and discharged on 12/18 due to DKA.  Patient was unable to provide history due to being intubated and sedated, history was obtained from daughter at bedside, ED physician and ED medical record.  Per report, patient went to use the restroom around 4 AM this morning, around 10 AM, he told the son that he was going back to bed.  Daughter at bedside called from work this afternoon to check on patient, and patient's wife tried to wake him up at 2 PM today but he was lethargic and unresponsive, EMS was activated, on arrival of EMS team, blood glucose was noted to be in the 30s, 1 amp of D50 was given in the field, but blood glucose was still 31 when patient arrived to the ED. Per daughter, a used insulin syringe was noted in the trash and patient was reported to have had only one sandwich last night.  ED Course:  In the emergency department, he was febrile, tachycardic, and intermittently tachypneic.  Several rounds of IV D50 were given due to hypoglycemia and patient was started on IV D10W.  Teleneurology was consulted by ED physician, and CT of head without contrast done showed no acute intracranial process.  CT angiography of head and neck showed no acute large or medium vessel occlusion.  Patient was intubated due to inability to protect  airway.  Chest x-ray done showed hazy bilateral airspace opacities which may represent multifocal pneumonia in the appropriate clinical setting. Patient was empirically started on IV antibiotics (vancomycin, doxycycline and cefepime). PCCM was consulted, no beds in [facility] and [facility], it was recommended for patient to be admitted here at [facility] with follow-up of labs and ABG by Elink and with plan to be seen here in the morning by PCCM. Hospitalist was asked to admit patient for further evaluation management.  Assessment & Plan: 1-acute metabolic encephalopathy in the setting of hypoglycemia -CT head negative for acute stroke -Patient blood sugar remains to be low despite the use of D50 -Currently receiving D10 -Continue holding hypoglycemic agents -Patient received a stress dose Solu-Cortef -Continue to follow clinical response. -He has been intubated for airway protection, and we will slowly move away from sedation.  2-multifocal pneumonia -With high concern for aspiration -Currently intubated -Continue cefepime -Follow pulmonology recommendations for extubation purposes  3-severe sepsis: POA -organ dysfunction (his lungs, from PNA; ended requiring intubation). -Patient with need for intubation, lactic acidosis, tachycardic, febrile and tachypneic meeting criteria for sepsis. -Continue antibiotics as mentioned above and supportive care -Vancomycin has been discontinued in the setting of negative MRSA PCR -Continue to maintain adequate hydration.  4-prolonged QT -Continue to follow extra lites and replete as needed -Avoid medications that can further prolong QT  5-metastatic pancreatic cancer -Continue outpatient follow-up by oncology service -Currently receiving palliative chemotherapy as an outpatient   DVT prophylaxis: Lovenox Code Status: Full code Family  Communication: daughter at bedside. Disposition:   Status is: Inpatient  Dispo: The patient is from: home               Anticipated d/c is to: To be determined              Anticipated d/c date is: To be determined              Patient currently no medically ready for discharge; patient still intubated, with soft blood pressure and is still requiring D10.       Consultants:   PCCM   Procedures:  See below for x-ray report.   Antimicrobials:  Cefepime   Subjective: Afebrile currently; patient is intubated and mechanically ventilated.  Objective: Vitals:   09/13/20 1300 09/13/20 1400 09/13/20 1500 09/13/20 1600  BP: 105/78 (!) 130/93 110/77 103/80  Pulse: 74 94 80 78  Resp: _0 Temp:      TempSrc:      SpO2: 100% 100% 100% 100%  Weight:      Height:        Intake/Output Summary (Last 24 hours) at 09/13/2020 1639 Last data filed at 09/13/2020 1637 Gross per 24 hour  Intake 1552.01 ml  Output 650 ml  Net 902.01 ml   Filed Weights   09/03/2020 1501  Weight: 62 kg    Examination:  General exam: Afebrile currently, underweight and chronically ill in appearance; patient is intubated and mechanically ventilated.  Not following commands. Respiratory system: Positive scattered rhonchi; no wheezing, no tachypnea appreciated. Cardiovascular system: S1 & S2 heard, RRR. No JVD, murmurs, rubs, gallops or clicks. No pedal edema. Gastrointestinal system: Abdomen is nondistended, soft and nontender. No organomegaly or masses felt. Normal bowel sounds heard. Central nervous system: Unable to properly assess with ongoing sedation. Extremities: Left BKA; no cyanosis or clubbing.  No edema. Skin: Right big toe with necrotic tip; no open wounds; no petechiae. Psychiatry: Unable to assess due to ongoing sedation.    Data Reviewed: I have personally reviewed following labs and imaging studies  CBC: Recent Labs  Lab 09/07/20 0802 09/08/20 1816 09/10/20 0616 09/11/20 0614 09/08/2020 1528 09/13/20 0051 09/13/20 0238  WBC 8.0 7.8 5.2 5.6 4.9 5.8 5.3  NEUTROABS 5.5 5.8 3.4 3.6 3.9  --    --   HGB 11.0* 11.3* 9.8* 10.7* 11.1* 9.6* 10.0*  HCT 34.8* 34.9* 31.3* 33.6* 34.4* 29.5* 30.5*  MCV 94.1 93.8 94.8 92.8 93.5 93.1 92.4  PLT 150 166 136* 143* 185 139* 135*    Basic Metabolic Panel: Recent Labs  Lab 09/07/20 0802 09/07/20 1145 09/08/20 1816 09/09/20 0242 09/10/20 0616 09/11/20 0614 09/10/2020 1528 09/13/20 0051 09/13/20 0238  NA 132*  --  133*   < > 137 135 131* 132* 133*  K 3.5  --  4.1   < > 3.5 3.4* 3.6 2.9* 2.8*  CL 99  --  99   < > 101 100 100 103 104  CO2 27  --  25   < > _1 GLUCOSE 529*   < > 610*   < > 57* 51* 189* 70 58*  BUN 9  --  14   < > _2 7* 6*  CREATININE 0.65  --  0.70   < > 0.59* 0.52* 0.41* 0.40*  0.44* 0.42*  CALCIUM 8.6*  --  9.0   < > 8.3* 8.2* 8.1* 7.6* 7.7*  MG 1.8  --  2.0  --   --  1.6*  --   --  1.6*  PHOS  --   --   --   --   --  2.9  --   --  3.0   < > = values in this interval not displayed.    GFR: Estimated Creatinine Clearance: 77.5 mL/min (A) (by C-G formula based on SCr of 0.42 mg/dL (L)).  Liver Function Tests: Recent Labs  Lab 09/08/20 1816 09/10/20 0616 09/11/20 0614 09/21/2020 1528 09/13/20 0238  AST 68* 70* 66* 71* 73*  ALT 34 38 39 44 38  ALKPHOS 309* 231* 288* 276* 232*  BILITOT 0.5 0.6 0.3 0.6 0.5  PROT 7.0 5.5* 5.8* 6.2* 5.5*  ALBUMIN 2.8* 2.2* 2.3* 2.6* 2.1*    CBG: Recent Labs  Lab 09/13/20 0144 09/13/20 0613 09/13/20 0814 09/13/20 1149 09/13/20 1626  GLUCAP 75 79 90 92 92    Recent Results (from the past 240 hour(s))  Resp Panel by RT-PCR (Flu A&B, Covid) Nasopharyngeal Swab     Status: None   Collection Time: 09/08/20  7:24 PM   Specimen: Nasopharyngeal Swab; Nasopharyngeal(NP) swabs in vial transport medium  Result Value Ref Range Status   SARS Coronavirus 2 by RT PCR NEGATIVE NEGATIVE Final    Comment: (NOTE) SARS-CoV-2 target nucleic acids are NOT DETECTED.  The SARS-CoV-2 RNA is generally detectable in upper respiratory specimens during the acute phase of  infection. The lowest concentration of SARS-CoV-2 viral copies this assay can detect is 138 copies/mL. A negative result does not preclude SARS-Cov-2 infection and should not be used as the sole basis for treatment or other patient management decisions. A negative result may occur with  improper specimen collection/handling, submission of specimen other than nasopharyngeal swab, presence of viral mutation(s) within the areas targeted by this assay, and inadequate number of viral copies(<138 copies/mL). A negative result must be combined with clinical observations, patient history, and epidemiological information. The expected result is Negative.  Fact Sheet for Patients:  EntrepreneurPulse.com.au  Fact Sheet for Healthcare Providers:  IncredibleEmployment.be  This test is no t yet approved or cleared by the [place] FDA and  has been authorized for detection and/or diagnosis of SARS-CoV-2 by FDA under an Emergency Use Authorization (EUA). This EUA will remain  in effect (meaning this test can be used) for the duration of the COVID-19 declaration under Section 564(b)(1) of the Act, 21 U.S.C.section 360bbb-3(b)(1), unless the authorization is terminated  or revoked sooner.       Influenza A by PCR NEGATIVE NEGATIVE Final   Influenza B by PCR NEGATIVE NEGATIVE Final    Comment: (NOTE) The Xpert Xpress SARS-CoV-2/FLU/RSV plus assay is intended as an aid in the diagnosis of influenza from Nasopharyngeal swab specimens and should not be used as a sole basis for treatment. Nasal washings and aspirates are unacceptable for Xpert Xpress SARS-CoV-2/FLU/RSV testing.  Fact Sheet for Patients: EntrepreneurPulse.com.au  Fact Sheet for Healthcare Providers: IncredibleEmployment.be  This test is not yet approved or cleared by the [place] FDA and has been authorized for detection and/or diagnosis of SARS-CoV-2  by FDA under an Emergency Use Authorization (EUA). This EUA will remain in effect (meaning this test can be used) for the duration of the COVID-19 declaration under Section 564(b)(1) of the Act, 21 U.S.C. section 360bbb-3(b)(1), unless the authorization is terminated or revoked.  Performed at [facility], [address] [place], [place] [address]   Urine culture     Status: None   Collection Time: 09/17/2020  3:20  PM   Specimen: In/Out Cath Urine  Result Value Ref Range Status   Specimen Description   Final    IN/OUT CATH URINE Performed at [facility], [address], [place], [place] [address]    Special Requests   Final    NONE Performed at [facility], [address], [place], [place] [address]    Culture   Final    NO GROWTH Performed at [facility], [place] [address], [place], [place] [address]    Report Status 09/13/2020 FINAL  Final  Resp Panel by RT-PCR (Flu A&B, Covid) Nasopharyngeal Swab     Status: None   Collection Time: 09/05/2020  3:27 PM   Specimen: Nasopharyngeal Swab; Nasopharyngeal(NP) swabs in vial transport medium  Result Value Ref Range Status   SARS Coronavirus 2 by RT PCR NEGATIVE NEGATIVE Final    Comment: (NOTE) SARS-CoV-2 target nucleic acids are NOT DETECTED.  The SARS-CoV-2 RNA is generally detectable in upper respiratory specimens during the acute phase of infection. The lowest concentration of SARS-CoV-2 viral copies this assay can detect is 138 copies/mL. A negative result does not preclude SARS-Cov-2 infection and should not be used as the sole basis for treatment or other patient management decisions. A negative result may occur with  improper specimen collection/handling, submission of specimen other than nasopharyngeal swab, presence of viral mutation(s) within the areas targeted by this assay, and inadequate number of viral copies(<138 copies/mL). A negative result must be combined with clinical observations, patient history, and  epidemiological information. The expected result is Negative.  Fact Sheet for Patients:  EntrepreneurPulse.com.au  Fact Sheet for Healthcare Providers:  IncredibleEmployment.be  This test is no t yet approved or cleared by the [place] FDA and  has been authorized for detection and/or diagnosis of SARS-CoV-2 by FDA under an Emergency Use Authorization (EUA). This EUA will remain  in effect (meaning this test can be used) for the duration of the COVID-19 declaration under Section 564(b)(1) of the Act, 21 U.S.C.section 360bbb-3(b)(1), unless the authorization is terminated  or revoked sooner.       Influenza A by PCR NEGATIVE NEGATIVE Final   Influenza B by PCR NEGATIVE NEGATIVE Final    Comment: (NOTE) The Xpert Xpress SARS-CoV-2/FLU/RSV plus assay is intended as an aid in the diagnosis of influenza from Nasopharyngeal swab specimens and should not be used as a sole basis for treatment. Nasal washings and aspirates are unacceptable for Xpert Xpress SARS-CoV-2/FLU/RSV testing.  Fact Sheet for Patients: EntrepreneurPulse.com.au  Fact Sheet for Healthcare Providers: IncredibleEmployment.be  This test is not yet approved or cleared by the [place] FDA and has been authorized for detection and/or diagnosis of SARS-CoV-2 by FDA under an Emergency Use Authorization (EUA). This EUA will remain in effect (meaning this test can be used) for the duration of the COVID-19 declaration under Section 564(b)(1) of the Act, 21 U.S.C. section 360bbb-3(b)(1), unless the authorization is terminated or revoked.  Performed at [facility], [address], [place], [place] [address]   Blood Culture (routine x 2)     Status: None (Preliminary result)   Collection Time: 09/01/2020  3:29 PM   Specimen: BLOOD  Result Value Ref Range Status   Specimen Description BLOOD PORTA CATH  Final   Special Requests   Final     BOTTLES DRAWN AEROBIC AND ANAEROBIC Blood Culture adequate volume   Culture   Final    NO GROWTH < 24 HOURS Performed at [facility], [place]  [address] [place], [place] [address]    Report Status PENDING  Incomplete  Blood Culture (routine x 2)     Status: None (Preliminary result)   Collection Time: 09/04/2020  3:38 PM   Specimen: BLOOD  Result Value Ref Range Status   Specimen Description BLOOD BLOOD RIGHT WRIST  Final   Special Requests   Final    BOTTLES DRAWN AEROBIC AND ANAEROBIC Blood Culture results may not be optimal due to an inadequate volume of blood received in culture bottles   Culture   Final    NO GROWTH < 24 HOURS Performed at [facility], [address], [place], [place] [address]    Report Status PENDING  Incomplete  MRSA PCR Screening     Status: None   Collection Time: 08/28/2020  3:59 PM   Specimen: Nasopharyngeal  Result Value Ref Range Status   MRSA by PCR NEGATIVE NEGATIVE Final    Comment:        The GeneXpert MRSA Assay (FDA approved for NASAL specimens only), is one component of a comprehensive MRSA colonization surveillance program. It is not intended to diagnose MRSA infection nor to guide or monitor treatment for MRSA infections. Performed at [facility], [address], [place], [place] [address]   Culture, blood (routine x 2)     Status: None (Preliminary result)   Collection Time: 09/13/20 12:51 AM   Specimen: BLOOD  Result Value Ref Range Status   Specimen Description BLOOD RIGHT ANTECUBITAL  Final   Special Requests   Final    BOTTLES DRAWN AEROBIC AND ANAEROBIC Blood Culture adequate volume   Culture   Final    NO GROWTH < 12 HOURS Performed at [facility], [address], [place], [place] [address]    Report Status PENDING  Incomplete  Culture, blood (routine x 2)     Status: None (Preliminary result)   Collection Time: 09/13/20 12:51 AM   Specimen: BLOOD LEFT HAND  Result Value Ref Range Status   Specimen Description BLOOD LEFT HAND   Final   Special Requests   Final    BOTTLES DRAWN AEROBIC AND ANAEROBIC Blood Culture adequate volume   Culture   Final    NO GROWTH < 12 HOURS Performed at [facility], [address], [place],  [address]    Report Status PENDING  Incomplete     Radiology Studies: CT Angio Head W or Wo Contrast  Result Date: 09/14/2020 CLINICAL DATA:  Found unresponsive. Recent diagnosis of pancreatic cancer. Stroke suspected. EXAM: CT ANGIOGRAPHY HEAD AND NECK TECHNIQUE: Multidetector CT imaging of the head and neck was performed using the standard protocol during bolus administration of intravenous contrast. Multiplanar CT image reconstructions and MIPs were obtained to evaluate the vascular anatomy. Carotid stenosis measurements (when applicable) are obtained utilizing NASCET criteria, using the distal internal carotid diameter as the denominator. CONTRAST:  30m OMNIPAQUE IOHEXOL 350 MG/ML SOLN COMPARISON:  Head CT same day FINDINGS: CTA NECK FINDINGS Aortic arch: Aortic atherosclerosis. No aneurysm or dissection. Branching pattern is normal. Mild atherosclerotic change at the brachiocephalic vessel origins. Right carotid system: Common carotid artery widely patent to the bifurcation. Calcified plaque at the carotid bifurcation and ICA bulb. No stenosis compared to the more distal cervical ICA diameter. Left carotid system: Common carotid artery is patent to the bifurcation with areas of plaque but no significant narrowing. Carotid bifurcation shows calcified plaque. ICA bulb shows calcified plaque with minimal diameter of 2.5 mm. Compared to a more distal cervical  ICA diameter of 4 mm, this indicates a 40% stenosis. Vertebral arteries: 30% stenosis of the proximal right subclavian artery. 50% stenosis of the right vertebral artery origin. Advanced calcified plaque of the left subclavian artery just proximal to the vertebral artery origin with stenosis of 70%. Severe stenosis of the proximal left vertebral  artery. Beyond their origins, both vertebral arteries are widely patent through the cervical region to the foramen magnum. Skeleton: Ordinary cervical spondylosis. Other neck: No mass or lymphadenopathy. Upper chest: Widespread patchy infiltrates in the left upper lobe consistent with bronchopneumonia. Underlying emphysema and pulmonary scarring. Review of the MIP images confirms the above findings CTA HEAD FINDINGS Anterior circulation: Both internal carotid arteries patent through the skull base and siphon regions. Severe stenosis in the carotid siphon regions, worse on the left than the right. Near occlusion on the left. Supraclinoid internal carotid arteries show severe atherosclerotic narrowing, worse on the left than the right. Flow is present in both anterior and middle cerebral arteries. There is considerable narrowing and irregularity of the more distal branch vessels, worse on the right than the left. I do not identify an acute vascular occlusion. Posterior circulation: Both vertebral arteries are patent through the foramen magnum to the basilar. No basilar stenosis. Posterior circulation branch vessels show flow. Atherosclerotic narrowing and irregularity of the PCA branches. Venous sinuses: Patent and normal. Anatomic variants: None significant. Review of the MIP images confirms the above findings IMPRESSION: 1. No acute large or medium vessel occlusion. 2. Atherosclerotic disease at both carotid bifurcations. No stenosis on the right. 40% stenosis of the left ICA bulb. 3. 50% stenosis of the right vertebral artery origin. 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 Severe stenosis of the proximal left vertebral artery. 4. Severe stenosis in both carotid siphon and supraclinoid ICA regions. 5. Severe atherosclerotic narrowing and irregularity of the more distal intracranial branch vessels diffusely. 6. Widespread patchy infiltrates  in the left upper lobe consistent with bronchopneumonia. 7. Emphysema and aortic atherosclerosis. Aortic Atherosclerosis (ICD10-I70.0) and Emphysema (ICD10-J43.9). Electronically Signed   By: Nelson Chimes M.D.   On: 09/02/2020 18:20   CT Head Wo Contrast  Result Date: 09/18/2020 CLINICAL DATA:  Unresponsive, history of pancreatic cancer EXAM: CT HEAD WITHOUT CONTRAST TECHNIQUE: Contiguous axial images were obtained from the base of the skull through the vertex without intravenous contrast. COMPARISON:  None. FINDINGS: Brain: Evaluation is limited by patient motion throughout the study. Focal hypodensities in the bilateral basal ganglia consistent with chronic lacunar infarcts. No signs of acute infarct or hemorrhage. Lateral ventricles and remaining midline structures are unremarkable. No acute extra-axial fluid collections. No mass effect. Vascular: No hyperdense vessel or unexpected calcification. Skull: Normal. Negative for fracture or focal lesion. Sinuses/Orbits: No acute finding. Other: None. IMPRESSION: 1. Limited evaluation due to patient motion. No acute intracranial process. Electronically Signed   By: Randa Ngo M.D.   On: 09/14/2020 16:41   CT Angio Neck W and/or Wo Contrast  Result Date: 09/18/2020 CLINICAL DATA:  Found unresponsive. Recent diagnosis of pancreatic cancer. Stroke suspected. EXAM: CT ANGIOGRAPHY HEAD AND NECK TECHNIQUE: Multidetector CT imaging of the head and neck was performed using the standard protocol during bolus administration of intravenous contrast. Multiplanar CT image reconstructions and MIPs were obtained to evaluate the vascular anatomy. Carotid stenosis measurements (when applicable) are obtained utilizing NASCET criteria, using the distal internal carotid diameter as the denominator. CONTRAST:  28m OMNIPAQUE IOHEXOL  350 MG/ML SOLN COMPARISON:  Head CT same day FINDINGS: CTA NECK FINDINGS Aortic arch: Aortic atherosclerosis. No aneurysm or dissection.  Branching pattern is normal. Mild atherosclerotic change at the brachiocephalic vessel origins. Right carotid system: Common carotid artery widely patent to the bifurcation. Calcified plaque at the carotid bifurcation and ICA bulb. No stenosis compared to the more distal cervical ICA diameter. Left carotid system: Common carotid artery is patent to the bifurcation with areas of plaque but no significant narrowing. Carotid bifurcation shows calcified plaque. ICA bulb shows calcified plaque with minimal diameter of 2.5 mm. Compared to a more distal cervical ICA diameter of 4 mm, this indicates a 40% stenosis. Vertebral arteries: 30% stenosis of the proximal right subclavian artery. 50% stenosis of the right vertebral artery origin. Advanced calcified plaque of the left subclavian artery just proximal to the vertebral artery origin with stenosis of 70%. Severe stenosis of the proximal left vertebral artery. Beyond their origins, both vertebral arteries are widely patent through the cervical region to the foramen magnum. Skeleton: Ordinary cervical spondylosis. Other neck: No mass or lymphadenopathy. Upper chest: Widespread patchy infiltrates in the left upper lobe consistent with bronchopneumonia. Underlying emphysema and pulmonary scarring. Review of the MIP images confirms the above findings CTA HEAD FINDINGS Anterior circulation: Both internal carotid arteries patent through the skull base and siphon regions. Severe stenosis in the carotid siphon regions, worse on the left than the right. Near occlusion on the left. Supraclinoid internal carotid arteries show severe atherosclerotic narrowing, worse on the left than the right. Flow is present in both anterior and middle cerebral arteries. There is considerable narrowing and irregularity of the more distal branch vessels, worse on the right than the left. I do not identify an acute vascular occlusion. Posterior circulation: Both vertebral arteries are patent through  the foramen magnum to the basilar. No basilar stenosis. Posterior circulation branch vessels show flow. Atherosclerotic narrowing and irregularity of the PCA branches. Venous sinuses: Patent and normal. Anatomic variants: None significant. Review of the MIP images confirms the above findings IMPRESSION: 1. No acute large or medium vessel occlusion. 2. Atherosclerotic disease at both carotid bifurcations. No stenosis on the right. 40% stenosis of the left ICA bulb. 3. 50% stenosis of the right vertebral artery origin. 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 Severe stenosis of the proximal left vertebral artery. 4. Severe stenosis in both carotid siphon and supraclinoid ICA regions. 5. Severe atherosclerotic narrowing and irregularity of the more distal intracranial branch vessels diffusely. 6. Widespread patchy infiltrates in the left upper lobe consistent with bronchopneumonia. 7. Emphysema and aortic atherosclerosis. Aortic Atherosclerosis (ICD10-I70.0) and Emphysema (ICD10-J43.9). Electronically Signed   By: Paulina Fusi M.D.   On: 09/06/2020 18:20   DG Chest Portable 1 View  Result Date: 09/21/2020 CLINICAL DATA:  Post intubation EXAM: PORTABLE CHEST 1 VIEW COMPARISON:  September 12, 2020 FINDINGS: The left subclavian Port-A-Cath is unchanged. The endotracheal tube terminates above the carina by approximately 4.4 cm. The enteric tube extends below the left hemidiaphragm. There is no pneumothorax. Bilateral hazy airspace opacities are noted. There is no large pleural effusion. IMPRESSION: 1. Lines and tubes as above.  No pneumothorax. 2. Hazy bilateral airspace opacities which may represent multifocal pneumonia in the appropriate clinical setting. Electronically Signed   By: Katherine Mantle M.D.   On: 09/17/2020 20:32   DG Chest Port 1 View  Result Date: 09/04/2020 CLINICAL DATA:  Sepsis,  unresponsive, history of pancreatic  cancer EXAM: PORTABLE CHEST 1 VIEW COMPARISON:  05/21/2020 FINDINGS: Single frontal view of the chest demonstrates an unremarkable cardiac silhouette. Left chest wall port unchanged. Chronic areas of scarring are seen throughout the lungs, stable. No effusion or pneumothorax. There is a prominent skin fold overlying the left chest. No acute bony abnormalities. IMPRESSION: 1. Chronic areas of scarring.  No acute intrathoracic process. 2. Prominent skin fold overlying left lung.  No pneumothorax. Electronically Signed   By: Randa Ngo M.D.   On: 08/27/2020 16:38    Scheduled Meds: . chlorhexidine gluconate (MEDLINE KIT)  15 mL Mouth Rinse BID  . Chlorhexidine Gluconate Cloth  6 each Topical Daily  . enoxaparin (LOVENOX) injection  40 mg Subcutaneous Q24H  . insulin aspart  0-15 Units Subcutaneous Q4H  . mouth rinse  15 mL Mouth Rinse 10 times per day  . pantoprazole (PROTONIX) IV  40 mg Intravenous Q12H  . sodium chloride flush  3 mL Intravenous Q12H   Continuous Infusions: . sodium chloride    . ceFEPime (MAXIPIME) IV 2 g (09/13/20 0942)  . dextrose 5 % and 0.9 % NaCl with KCl 40 mEq/L 75 mL/hr at 09/13/20 1637  . doxycycline (VIBRAMYCIN) IV 125 mL/hr at 09/13/20 1637     LOS: 1 day    Time spent: 35 minutes    Barton Dubois, MD [facility]   To contact the attending provider between 7A-7P or the covering provider during after hours 7P-7A, please log into the web site [email or web address] and access using universal [place] password for that web site. If you do not have the password, please call the hospital operator.  09/13/2020, 4:39 PM

## 2020-09-13 NOTE — Progress Notes (Signed)
Hypoglycemic protocol followed with amp of d50 given for cbg of 51

## 2020-09-13 NOTE — Progress Notes (Signed)
Inpatient Diabetes Program Recommendations  AACE/ADA: New Consensus Statement on Inpatient Glycemic Control (2015)  Target Ranges:  Prepandial:   less than 140 mg/dL      Peak postprandial:   less than 180 mg/dL (1-2 hours)      Critically ill patients:  140 - 180 mg/dL   Lab Results  Component Value Date   GLUCAP 92 09/13/2020   HGBA1C 9.9 (A) 08/02/2020    Review of Glycemic Control  Diabetes history: DM2 Outpatient Diabetes medications: Tresiba 25 units + Metformin 1 gm bid Current orders for Inpatient glycemic control: Novolog 0-15 units q 4 hrs.  Inpatient Diabetes Program Recommendations:   Noted patient was just discharged on 09/11/20 from admission with hyperglycemia. During hospitalization, recommended to decrease basal insulin to 16 units qd. Will need adjustment in discharge insulin management and review of Metformin @ home while on chemo. Will follow during hospitalization.  -Decrease Novolog correction to 0-6 units  Thank you, Bethena Roys E. Kaithlyn Teagle, RN, MSN, CDE  Diabetes Coordinator Inpatient Glycemic Control Team Team Pager [phone] (8am-5pm) 09/13/2020 11:56 AM

## 2020-09-13 NOTE — Consult Note (Addendum)
NAME:  [name]., MRN:  [id number], DOB:  1950/10/12, LOS: 1 ADMISSION DATE:  08/25/2020, CONSULTATION DATE:  09/13/2020  REFERRING MD:  Molli Posey , CHIEF COMPLAINT: Intubated for airway protection  Brief History:  69 year old with metastatic pancreatic cancer on chemotherapy, insulin requiring diabetes type 2 brought in by EMS after being found unresponsive, CBG in the 30s, remained low on arrival to ED, intubated for airway protection chest x-ray shows of aspiration  History of Present Illness:  He has completed 5 cycles of chemotherapy for pancreatic cancer with metastasis to liver.  He had a recent admission 12/15-12/18 for DKA.  Patient apparently takes his own insulin, he is on 25 units of long-acting insulin at bedtime.  History is obtained per chart review.  Found to be unresponsive around 2 PM by his wife and EMS was called, found to be hypoglycemic in the 30s given D50 but remained hypoglycemic on arrival to the ED, started on D10.  Noted to be febrile and tachycardic.  Intubated for airway protection due to severe encephalopathy. 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 rate 15, height $RemoveBe'5\' 11"'bzujMjsrJ$  (1.803 m), weight 62 kg, SpO2 100 %.    Vent Mode: PSV FiO2 (%):  [35 %-40 %] 35 % Set Rate:  [15 bmp-20 bmp] 15 bmp Vt Set:  [600 mL] 600 mL PEEP:  [5 cmH20] 5 cmH20 Pressure Support:   [13 cmH20] 13 cmH20 Plateau Pressure:  [13 cmH20-18 cmH20] 14 cmH20   Intake/Output Summary (Last 24 hours) at 09/13/2020 1325 Last data filed at 09/13/2020 4098 Gross per 24 hour  Intake 624.24 ml  Output 650 ml  Net -25.76 ml   Filed Weights   09/06/2020 1501  Weight: 62 kg    Examination: Gen:      elderly man, intubated, cachectic appearing, no distress  HEENT:  EOMI, sclera anicteric, mild pallor Neck:     No JVD; no thyromegaly Lungs:    Bilateral ventilated breath sounds CV:         Regular rate and rhythm; no murmurs Abd:      + bowel sounds; soft, non-tender; no palpable masses, no distension Ext:    No edema; adequate peripheral perfusion Skin:      Warm and dry; no rash Neuro: Unresponsive sedated on low-dose fentanyl, RASS -3  Chest x-ray 12/19 independently reviewed, ET tube in position, left subclavian Port-A-Cath Multifocal bilateral airspace disease  Resolved Hospital Problem list     Assessment & Plan:  Intubated for airway protection due to hypoglycemic encephalopathy no evidence of stroke on imaging  Acute respiratory failure -hold sedation and assess mental status. If fully awake we will proceed with spontaneous breathing trials with goal extubation  Acute encephalopathy-was related to hypoglycemia , no evidence of stroke on imaging, hyperglycemia is improved with dextrose drip -If agitation can use low-dose Precedex, DC fentanyl drip and use as needed  Diabetes type 2 -recent hospitalization for DKA, basal insulin was to be decreased to 16 but seems like patient may have still been taking 25 units at home with Metformin - will need education and reassess his ability to self administer insulin -D10 drip for now  Hypotension -concern for aspiration pneumonia Normal saline 500 cc bolus -Continue cefepime, await respiratory culture  Pancreatic cancer/cachexia -Prognosis guarded, if remains intubated more than 24 hours, start tube feeds  Best practice  (evaluated daily)  Diet: NPO Pain/Anxiety/Delirium protocol (if indicated): As needed for now VAP protocol (if indicated): Y DVT prophylaxis: SCDs, can use Lovenox GI prophylaxis: Protonix Glucose control: Per triad Mobility: Bedrest Disposition: ICU  Goals of Care:   Code Status: Full, daughter updated at bedside  Labs   CBC: Recent Labs  Lab 09/07/20 0802 09/08/20 1816 09/10/20 0616 09/11/20 0614 09/09/2020 1528 09/13/20 0051 09/13/20 0238  WBC 8.0 7.8 5.2 5.6 4.9 5.8 5.3  NEUTROABS 5.5 5.8 3.4 3.6 3.9  --   --   HGB 11.0* 11.3* 9.8* 10.7* 11.1* 9.6* 10.0*  HCT 34.8* 34.9* 31.3* 33.6* 34.4* 29.5* 30.5*  MCV 94.1 93.8 94.8 92.8 93.5 93.1 92.4  PLT 150 166 136* 143* 185 139* 135*    Basic Metabolic Panel: Recent Labs  Lab 09/07/20 0802 09/07/20 1145 09/08/20 1816 09/09/20 0242 09/10/20 0616 09/11/20 0614 09/22/2020 1528 09/13/20 0051 09/13/20 0238  NA 132*  --  133*   < > 137 135 131* 132* 133*  K 3.5  --  4.1   < > 3.5 3.4* 3.6 2.9* 2.8*  CL 99  --  99   < > 101 100 100 103 104  CO2 27  --  25   < > $R'27 26 25 22 23  'QT$ GLUCOSE 529*   < > 610*   < > 57* 51* 189* 70 58*  BUN 9  --  14   < > $R'10 8 8 'nR$ 7* 6*  CREATININE 0.65  --  0.70   < > 0.59* 0.52* 0.41* 0.40*  0.44* 0.42*  CALCIUM 8.6*  --  9.0   < > 8.3* 8.2* 8.1* 7.6* 7.7*  MG 1.8  --  2.0  --   --  1.6*  --   --  1.6*  PHOS  --   --   --   --   --  2.9  --   --  3.0   < > = values in this interval not displayed.   GFR: Estimated Creatinine Clearance: 77.5 mL/min (A) (by C-G formula based on SCr of 0.42 mg/dL (L)). Recent Labs  Lab 09/11/20 0614 09/14/2020 1528 09/20/2020 1721 09/13/20 0051 09/13/20 0237 09/13/20 0238  PROCALCITON  --   --   --  0.28  --   --   WBC 5.6 4.9  --  5.8  --  5.3  LATICACIDVEN  --  1.9 2.5* 2.2* 1.3  --     Liver Function Tests: Recent Labs  Lab 09/08/20 1816 09/10/20 0616 09/11/20 0614 09/04/2020 1528 09/13/20 0238  AST 68* 70* 66* 71* 73*  ALT 34 38 39 44 38  ALKPHOS  309* 231* 288* 276* 232*  BILITOT 0.5 0.6 0.3 0.6 0.5  PROT 7.0 5.5* 5.8* 6.2* 5.5*  ALBUMIN 2.8* 2.2* 2.3* 2.6* 2.1*   No results for input(s): LIPASE, AMYLASE in the last 168 hours. No results for input(s): AMMONIA in the last 168 hours.  ABG    Component Value Date/Time   PHART  7.479 (H) 09/13/2020 0415   PCO2ART 30.5 (L) 09/13/2020 0415   PO2ART 163 (H) 09/13/2020 0415   HCO3 24.4 09/13/2020 0415   ACIDBASEDEF 0.7 09/13/2020 0415   O2SAT 99.0 09/13/2020 0415     Coagulation Profile: Recent Labs  Lab 09/24/2020 1528 09/13/20 0238  INR 0.9 1.1    Cardiac Enzymes: No results for input(s): CKTOTAL, CKMB, CKMBINDEX, TROPONINI in the last 168 hours.  HbA1C: Hemoglobin A1C  Date/Time Value Ref Range Status  08/02/2020 10:48 AM 9.9 (A) 4.0 - 5.6 % Final   Hgb A1c MFr Bld  Date/Time Value Ref Range Status  04/24/2020 12:07 PM 10.8 (H) 4.8 - 5.6 % Final    Comment:    (NOTE) Pre diabetes:          5.7%-6.4%  Diabetes:              >6.4%  Glycemic control for   <7.0% adults with diabetes   07/21/2018 07:15 AM 7.4 (H) 4.8 - 5.6 % Final    Comment:    (NOTE) Pre diabetes:          5.7%-6.4% Diabetes:              >6.4% Glycemic control for   <7.0% adults with diabetes     CBG: Recent Labs  Lab 08/25/2020 2233 09/13/20 0144 09/13/20 0613 09/13/20 0814 09/13/20 1149  GLUCAP 188* 75 79 90 92    Review of Systems:   Unable to obtain since intubated and sedated  Past Medical History:  He,  has a past medical history of Acute blood loss anemia, Arthritis, Benign essential HTN, Cancer ([place]), Cellulitis (07/20/2018), Cellulitis in diabetic foot ([place]) (07/20/2018), Diabetes mellitus without complication ([place]), Diabetes mellitus, type II ([place]) (07/20/2018), 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   Surgical History:   Past Surgical History:  Procedure Laterality Date  . AMPUTATION Left 08/02/2018   Procedure: LEFT AMPUTATION BELOW KNEE;  Surgeon: Marty Heck, MD;  Location: [facility];  Service: Vascular;  Laterality: Left;  . BILIARY STENT PLACEMENT N/A 06/11/2020   Procedure: BILIARY STENT PLACEMENT;  Surgeon: Rogene Houston, MD;  [facility];  Service: Endoscopy;  Laterality: N/A;  . ELBOW SURGERY     Bone graft to repair elbow  . ERCP N/A 06/11/2020   Procedure: ENDOSCOPIC RETROGRADE CHOLANGIOPANCREATOGRAPHY (ERCP);  Surgeon: Rogene Houston, MD;  [facility];  Service: Endoscopy;  Laterality: N/A;  . LOWER EXTREMITY ANGIOGRAPHY N/A 07/24/2018   Procedure: LOWER EXTREMITY ANGIOGRAPHY;  Surgeon: Marty Heck, MD;  Location: [facility];  Service: Cardiovascular;  Laterality: N/A;  . PERIPHERAL VASCULAR INTERVENTION  07/24/2018   Procedure: PERIPHERAL VASCULAR INTERVENTION;  Surgeon: Marty Heck, MD;  Location: [facility];  Service: Cardiovascular;;  . PORTACATH PLACEMENT Left 05/21/2020   Procedure: INSERTION PORT-A-CATH;  Surgeon: Aviva Signs, MD;  Location: [facility];  Service: General;  Laterality: Left;  . SPHINCTEROTOMY  06/11/2020   Procedure: SHORT SPHINCTEROTOMY;  Surgeon: Rogene Houston, MD;  [facility];  Service: Endoscopy;;  . TONSILLECTOMY    . TRANSMETATARSAL AMPUTATION Left 07/25/2018   Procedure: LEFT GREAT TOE AMPUTATION;  Surgeon: Marty Heck, MD;  Location: [facility];  Service: Vascular;  Laterality: Left;  . TRANSMETATARSAL AMPUTATION Left 07/30/2018   Procedure: TRANSMETATARSAL AMPUTATION;  Surgeon: Donzetta Matters,  Georgia Dom, MD;  Location: [facility];  Service: Vascular;  Laterality: Left;     Social History:   reports that he has been smoking cigarettes. He has been smoking about 0.25 packs per day. He quit smokeless tobacco use about 31 years ago.  His smokeless  tobacco use included snuff. He reports previous alcohol use. He reports that he does not use drugs.   Family History:  His family history includes Alcoholism in his father and mother; Cervical cancer in his sister and sister.   Allergies Allergies  Allergen Reactions  . Tea Itching and Rash     Home Medications  Prior to Admission medications   Medication Sig Start Date End Date Taking? Authorizing Provider  Accu-Chek Softclix Lancets lancets 1 each by Other route in the morning, at noon, in the evening, and at bedtime. Dx: E11.65    [provider]  acetaminophen (TYLENOL) 500 MG tablet Take 500-1,000 mg by mouth every 6 (six) hours as needed (for pain).     [provider]  bisacodyl (DULCOLAX) 5 MG EC tablet Take 5 mg by mouth daily as needed for moderate constipation.     [provider]  blood glucose meter kit and supplies KIT Dispense based on patient and insurance preference. Use up to four times daily as directed. (FOR ICD-9 250.00, 250.01). 07/08/20   Derek Jack, MD  Blood Glucose Monitoring Suppl (ACCU-CHEK GUIDE ME) w/Device KIT 1 Piece by Does not apply route as directed. 07/22/20   Cassandria Anger, MD  dexamethasone (DECADRON) 10 MG/ML injection Inject 10 mg into the vein every 14 (fourteen) days. Dexamethasone 10mg  in NS 50 ml IVPB prior to chemotherapy administration  05/26/20   [provider]  fluorouracil CALGB 04888 in sodium chloride 0.9 % 150 mL Inject 2,400 mg/m2 into the vein over 48 hr.  05/26/20   [provider]  fosaprepitant (EMEND) 150 MG SOLR injection Inject 150 mg into the vein every 14 (fourteen) days. Prior to chemotherapy administration  05/26/20   [provider]  glucose blood (ACCU-CHEK GUIDE) test strip Test BG 4 x daily. DX E11.65 07/22/20   Cassandria Anger, MD  insulin degludec (TRESIBA FLEXTOUCH) 100 UNIT/ML FlexTouch Pen Inject 25 Units into the skin at bedtime. 08/02/20   Brita Romp, NP  Insulin Pen Needle 32G X 4 MM MISC Use to inject insulin 08/02/20   Brita Romp, NP  IRINOTECAN HCL IV Inject 280 mg/m2 into the vein every 14 (fourteen) days.  05/26/20   [provider]  LEUCOVORIN CALCIUM IV Inject 400 mg/m2 into the vein every 14 (fourteen) days.  05/26/20   [provider]  lidocaine-prilocaine (EMLA) cream Apply a small amount to port a cath site and cover with plastic wrap 1 hour prior to chemotherapy appointments 05/25/20   Derek Jack, MD  lipase/protease/amylase (CREON) 36000 UNITS CPEP capsule Take 1 capsule (36,000 Units total) by mouth 3 (three) times daily with meals AND 1 capsule (36,000 Units total) with snacks. 07/22/20   Cassandria Anger, MD  metFORMIN (GLUCOPHAGE) 500 MG tablet Take 1,000 mg by mouth 2 (two) times daily. 07/22/20   [provider]  OXALIPLATIN IV Inject 85 mg/m2 into the vein every 14 (fourteen) days.  05/26/20   [provider]  palonosetron (ALOXI) 0.25 MG/5ML injection Inject 0.25 mg into the vein every 14 (fourteen) days. Prior to chemotherapy administration  05/26/20   [provider]  potassium chloride SA (KLOR-CON) 20  MEQ tablet Take 1 tablet (20 mEq total) by mouth 2 (two) times daily. 07/30/20   Jacquelin Hawking, NP  prochlorperazine (COMPAZINE) 10 MG tablet Take 1 tablet (10 mg total) by mouth every 6 (six) hours as needed for nausea or vomiting. 06/23/20   Derek Jack, MD  tamsulosin (FLOMAX) 0.4 MG CAPS capsule Take 1 capsule (0.4 mg total) by mouth at bedtime. 07/30/20   Jacquelin Hawking, NP     Critical care time: 83m     Kara Mead MD. [place] Flood. [facility] See Amion for pager  If no response to pager , please call [phone]  After 7:00 pm call Elink  [phone]   09/13/2020

## 2020-09-14 ENCOUNTER — Inpatient Hospital Stay (HOSPITAL_COMMUNITY)
Admit: 2020-09-14 | Discharge: 2020-09-14 | Disposition: A | Discharge status: Home / Self Care | Payer: Medicare Other | Attending: Pulmonary Disease | Admitting: Pulmonary Disease

## 2020-09-14 DIAGNOSIS — Z515 Encounter for palliative care: Secondary | ICD-10-CM

## 2020-09-14 DIAGNOSIS — G9341 Metabolic encephalopathy: Secondary | ICD-10-CM | POA: Diagnosis not present

## 2020-09-14 DIAGNOSIS — J189 Pneumonia, unspecified organism: Secondary | ICD-10-CM | POA: Diagnosis not present

## 2020-09-14 DIAGNOSIS — Z7189 Other specified counseling: Secondary | ICD-10-CM

## 2020-09-14 DIAGNOSIS — E872 Acidosis: Secondary | ICD-10-CM | POA: Diagnosis not present

## 2020-09-14 DIAGNOSIS — C259 Malignant neoplasm of pancreas, unspecified: Secondary | ICD-10-CM | POA: Diagnosis not present

## 2020-09-14 DIAGNOSIS — A419 Sepsis, unspecified organism: Secondary | ICD-10-CM | POA: Diagnosis not present

## 2020-09-14 DIAGNOSIS — J9601 Acute respiratory failure with hypoxia: Secondary | ICD-10-CM | POA: Diagnosis not present

## 2020-09-14 LAB — GLUCOSE, CAPILLARY
Glucose-Capillary: 119 mg/dL — ABNORMAL HIGH (ref 70–99)
Glucose-Capillary: 121 mg/dL — ABNORMAL HIGH (ref 70–99)
Glucose-Capillary: 139 mg/dL — ABNORMAL HIGH (ref 70–99)
Glucose-Capillary: 173 mg/dL — ABNORMAL HIGH (ref 70–99)
Glucose-Capillary: 200 mg/dL — ABNORMAL HIGH (ref 70–99)
Glucose-Capillary: 216 mg/dL — ABNORMAL HIGH (ref 70–99)
Glucose-Capillary: 65 mg/dL — ABNORMAL LOW (ref 70–99)

## 2020-09-14 LAB — LEGIONELLA PNEUMOPHILA SEROGP 1 UR AG: L. pneumophila Serogp 1 Ur Ag: NEGATIVE

## 2020-09-14 MED ORDER — PROSOURCE TF PO LIQD
90.0000 mL | Freq: Three times a day (TID) | ORAL | Status: DC
  Administered 2020-09-14 – 2020-09-17 (×9): 90 mL
  Filled 2020-09-14 (×10): qty 90

## 2020-09-14 MED ORDER — DEXTROSE 50 % IV SOLN
INTRAVENOUS | Status: AC
  Administered 2020-09-14: 05:00:00 50 mL
  Filled 2020-09-14: qty 50

## 2020-09-14 MED ORDER — OSMOLITE 1.2 CAL PO LIQD
1000.0000 mL | ORAL | Status: AC
  Administered 2020-09-14 – 2020-09-16 (×3): 1000 mL

## 2020-09-14 NOTE — Progress Notes (Signed)
PROGRESS NOTE    John Lopezgarcia Sr.  NIO:[id number] DOB: 11/28/1950 DOA: 09/11/2020 PCP: Perlie Mayo, NP    Chief Complaint  Patient presents with  . unresponsive    Brief Narrative:  As per H&P written by Dr. Josephine Cables on 09/16/2020 John Islam Sr. is a 69 y.o. 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 Morey Hummingbird who presents to the emergency department via EMS due to being unresponsive at home.  Patient was recently admitted on 12/15 and discharged on 12/18 due to DKA.  Patient was unable to provide history due to being intubated and sedated, history was obtained from daughter at bedside, ED physician and ED medical record.  Per report, patient went to use the restroom around 4 AM this morning, around 10 AM, he told the son that he was going back to bed.  Daughter at bedside called from work this afternoon to check on patient, and patient's wife tried to wake him up at 2 PM today but he was lethargic and unresponsive, EMS was activated, on arrival of EMS team, blood glucose was noted to be in the 30s, 1 amp of D50 was given in the field, but blood glucose was still 31 when patient arrived to the ED. Per daughter, a used insulin syringe was noted in the trash and patient was reported to have had only one sandwich last night.  ED Course:  In the emergency department, he was febrile, tachycardic, and intermittently tachypneic.  Several rounds of IV D50 were given due to hypoglycemia and patient was started on IV D10W.  Teleneurology was consulted by ED physician, and CT of head without contrast done showed no acute intracranial process.  CT angiography of head and neck showed no acute large or medium vessel occlusion.  Patient was intubated due to inability to protect  airway.  Chest x-ray done showed hazy bilateral airspace opacities which may represent multifocal pneumonia in the appropriate clinical setting. Patient was empirically started on IV antibiotics (vancomycin, doxycycline and cefepime). PCCM was consulted, no beds in [facility] and [facility], it was recommended for patient to be admitted here at [facility] with follow-up of labs and ABG by Elink and with plan to be seen here in the morning by PCCM. Hospitalist was asked to admit patient for further evaluation management.  Assessment & Plan: 1-acute metabolic encephalopathy in the setting of hypoglycemia -CT head negative for acute stroke -Patient blood sugar remains to be low despite the use of D50 -Currently receiving D10 -Continue holding hypoglycemic agents -Patient received a stress dose Solu-Cortef -Continue to follow clinical response. -He has been intubated for airway protection, and we will slowly move away from sedation. -Sedation off for 24 hours and is still having lack of appropriate response; currently mental status is main barrier for extubation. -Continue DVTs.  2-multifocal pneumonia -With high concern for aspiration -Currently intubated -Continue cefepime and doxycycline -Follow pulmonology recommendations for extubation purposes  3-severe sepsis: POA -organ dysfunction (his lungs, from PNA; ended requiring intubation). -Patient with need for intubation, lactic acidosis, tachycardic, febrile and tachypneic meeting criteria for sepsis. -Continue antibiotics as mentioned above and supportive care -Vancomycin has been discontinued in the setting of negative MRSA PCR -Continue to maintain adequate hydration.  4-prolonged QT -Continue to follow extra lites and replete as needed -Avoid medications that can further prolong QT  5-metastatic  pancreatic cancer -Continue outpatient follow-up by oncology service -Currently receiving palliative chemotherapy as an outpatient   DVT prophylaxis:  Lovenox Code Status: Full code Family Communication: daughter at bedside. Disposition:   Status is: Inpatient  Dispo: The patient is from: home              Anticipated d/c is to: To be determined              Anticipated d/c date is: To be determined              Patient currently no medically ready for discharge; patient still intubated, with soft blood pressure and is still requiring D10.  Overall prognosis is guarded/poor.    Consultants:   PCCM  Palliative care   Procedures:  See below for x-ray report.   Antimicrobials:  Cefepime Doxycycline   Subjective: No fever, patient has remained intubated and mechanically ventilated.  Not following commands despite 24 hours of sedation; this is raising concerns for anoxic brain injury.  Patient ended experiencing episode of hypoglycemia overnight despite being on D10 infusion.  Objective: Vitals:   09/14/20 0900 09/14/20 1145 09/14/20 1200 09/14/20 1300  BP: 115/66  (!) 146/82 (!) 145/87  Pulse: 68  73 72  Resp: $Remo'15  16 10  'leMWa$ Temp:  (!) 96.5 F (35.8 C)    TempSrc:  Axillary    SpO2: 97%  100% 100%  Weight:      Height: $Remove'5\' 8"'kxPTxBn$  (1.727 m)       Intake/Output Summary (Last 24 hours) at 09/14/2020 1602 Last data filed at 09/14/2020 1258 Gross per 24 hour  Intake 3234.27 ml  Output 300 ml  Net 2934.27 ml   Filed Weights   09/16/2020 1501 09/14/20 0512  Weight: 62 kg 65.8 kg    Examination: General exam: Afebrile, intubated, mechanically ventilated and despite lack of sedation, he is having trouble following commands. Respiratory system: Positive scattered rhonchi; bilateral ventilated breath sounds appreciated.  No using accessory muscle. Cardiovascular system:RRR. No murmurs, rubs, gallops. Gastrointestinal system: Abdomen is nondistended, soft and nontender. No organomegaly or masses felt. Normal bowel sounds heard. Central nervous system: Unable to properly assess currently.  Patient suggests an comatose component  after 24 hours of sedation; mild posturing to deep pain stimuli.  No following commands. Extremities: No cyanosis or clubbing; left BKA, no edema. Skin: No petechiae.  Right great toe with gangrenous tip Psychiatry: Unable to properly assess currently.   Data Reviewed: I have personally reviewed following labs and imaging studies  CBC: Recent Labs  Lab 09/08/20 1816 09/10/20 0616 09/11/20 0614 09/05/2020 1528 09/13/20 0051 09/13/20 0238  WBC 7.8 5.2 5.6 4.9 5.8 5.3  NEUTROABS 5.8 3.4 3.6 3.9  --   --   HGB 11.3* 9.8* 10.7* 11.1* 9.6* 10.0*  HCT 34.9* 31.3* 33.6* 34.4* 29.5* 30.5*  MCV 93.8 94.8 92.8 93.5 93.1 92.4  PLT 166 136* 143* 185 139* 135*    Basic Metabolic Panel: Recent Labs  Lab 09/08/20 1816 09/09/20 0242 09/10/20 0616 09/11/20 0614 09/11/2020 1528 09/13/20 0051 09/13/20 0238  NA 133*   < > 137 135 131* 132* 133*  K 4.1   < > 3.5 3.4* 3.6 2.9* 2.8*  CL 99   < > 101 100 100 103 104  CO2 25   < > $R'27 26 25 22 23  'gJ$ GLUCOSE 610*   < > 57* 51* 189* 70 58*  BUN 14   < > $R'10 8 8 'wk$ 7* 6*  CREATININE 0.70   < > 0.59* 0.52* 0.41* 0.40*  0.44* 0.42*  CALCIUM 9.0   < > 8.3* 8.2* 8.1* 7.6* 7.7*  MG 2.0  --   --  1.6*  --   --  1.6*  PHOS  --   --   --  2.9  --   --  3.0   < > = values in this interval not displayed.    GFR: Estimated Creatinine Clearance: 82.3 mL/min (A) (by C-G formula based on SCr of 0.42 mg/dL (L)).  Liver Function Tests: Recent Labs  Lab 09/08/20 1816 09/10/20 0616 09/11/20 0614 09/02/2020 1528 09/13/20 0238  AST 68* 70* 66* 71* 73*  ALT 34 38 39 44 38  ALKPHOS 309* 231* 288* 276* 232*  BILITOT 0.5 0.6 0.3 0.6 0.5  PROT 7.0 5.5* 5.8* 6.2* 5.5*  ALBUMIN 2.8* 2.2* 2.3* 2.6* 2.1*    CBG: Recent Labs  Lab 09/13/20 2328 09/14/20 0003 09/14/20 0433 09/14/20 0818 09/14/20 1140  GLUCAP 51* 139* 65* 121* 119*    Recent Results (from the past 240 hour(s))  Resp Panel by RT-PCR (Flu A&B, Covid) Nasopharyngeal Swab     Status: None    Collection Time: 09/08/20  7:24 PM   Specimen: Nasopharyngeal Swab; Nasopharyngeal(NP) swabs in vial transport medium  Result Value Ref Range Status   SARS Coronavirus 2 by RT PCR NEGATIVE NEGATIVE Final    Comment: (NOTE) SARS-CoV-2 target nucleic acids are NOT DETECTED.  The SARS-CoV-2 RNA is generally detectable in upper respiratory specimens during the acute phase of infection. The lowest concentration of SARS-CoV-2 viral copies this assay can detect is 138 copies/mL. A negative result does not preclude SARS-Cov-2 infection and should not be used as the sole basis for treatment or other patient management decisions. A negative result may occur with  improper specimen collection/handling, submission of specimen other than nasopharyngeal swab, presence of viral mutation(s) within the areas targeted by this assay, and inadequate number of viral copies(<138 copies/mL). A negative result must be combined with clinical observations, patient history, and epidemiological information. The expected result is Negative.  Fact Sheet for Patients:  EntrepreneurPulse.com.au  Fact Sheet for Healthcare Providers:  IncredibleEmployment.be  This test is no t yet approved or cleared by the [place] FDA and  has been authorized for detection and/or diagnosis of SARS-CoV-2 by FDA under an Emergency Use Authorization (EUA). This EUA will remain  in effect (meaning this test can be used) for the duration of the COVID-19 declaration under Section 564(b)(1) of the Act, 21 U.S.C.section 360bbb-3(b)(1), unless the authorization is terminated  or revoked sooner.       Influenza A by PCR NEGATIVE NEGATIVE Final   Influenza B by PCR NEGATIVE NEGATIVE Final    Comment: (NOTE) The Xpert Xpress SARS-CoV-2/FLU/RSV plus assay is intended as an aid in the diagnosis of influenza from Nasopharyngeal swab specimens and should not be used as a sole basis for treatment.  Nasal washings and aspirates are unacceptable for Xpert Xpress SARS-CoV-2/FLU/RSV testing.  Fact Sheet for Patients: EntrepreneurPulse.com.au  Fact Sheet for Healthcare Providers: IncredibleEmployment.be  This test is not yet approved or cleared by the [place] FDA and has been authorized for detection and/or diagnosis of SARS-CoV-2 by FDA under an Emergency Use Authorization (EUA). This EUA will remain in effect (meaning this test can be used) for the duration of the COVID-19 declaration under Section 564(b)(1) of the Act, 21 U.S.C. section 360bbb-3(b)(1), unless the authorization is terminated or revoked.  Performed at [facility], [address], [place], [place] [address]   Urine culture     Status: None   Collection Time: 09/09/2020  3:20 PM   Specimen: In/Out Cath Urine  Result Value Ref Range Status   Specimen Description   Final    IN/OUT CATH URINE Performed at [facility], [address], [place], [place] [address]    Special Requests   Final    NONE Performed at [facility], [address] [place], [place] [address]    Culture   Final    NO GROWTH Performed at [facility], [place] [address] [place], [place] [address]    Report Status 09/13/2020 FINAL  Final  Resp Panel by RT-PCR (Flu A&B, Covid) Nasopharyngeal Swab     Status: None   Collection Time: 09/18/2020  3:27 PM   Specimen: Nasopharyngeal Swab; Nasopharyngeal(NP) swabs in vial transport medium  Result Value Ref Range Status   SARS Coronavirus 2 by RT PCR NEGATIVE NEGATIVE Final    Comment: (NOTE) SARS-CoV-2 target nucleic acids are NOT DETECTED.  The SARS-CoV-2 RNA is generally detectable in upper respiratory specimens during the acute phase of infection. The lowest concentration of SARS-CoV-2 viral copies this assay can detect is 138 copies/mL. A negative result does not preclude SARS-Cov-2 infection and should not be used as the sole basis for treatment  or other patient management decisions. A negative result may occur with  improper specimen collection/handling, submission of specimen other than nasopharyngeal swab, presence of viral mutation(s) within the areas targeted by this assay, and inadequate number of viral copies(<138 copies/mL). A negative result must be combined with clinical observations, patient history, and epidemiological information. The expected result is Negative.  Fact Sheet for Patients:  EntrepreneurPulse.com.au  Fact Sheet for Healthcare Providers:  IncredibleEmployment.be  This test is no t yet approved or cleared by the [place] FDA and  has been authorized for detection and/or diagnosis of SARS-CoV-2 by FDA under an Emergency Use Authorization (EUA). This EUA will remain  in effect (meaning this test can be used) for the duration of the COVID-19 declaration under Section 564(b)(1) of the Act, 21 U.S.C.section 360bbb-3(b)(1), unless the authorization is terminated  or revoked sooner.       Influenza A by PCR NEGATIVE NEGATIVE Final   Influenza B by PCR NEGATIVE NEGATIVE Final    Comment: (NOTE) The Xpert Xpress SARS-CoV-2/FLU/RSV plus assay is intended as an aid in the diagnosis of influenza from Nasopharyngeal swab specimens and should not be used as a sole basis for treatment. Nasal washings and aspirates are unacceptable for Xpert Xpress SARS-CoV-2/FLU/RSV testing.  Fact Sheet for Patients: EntrepreneurPulse.com.au  Fact Sheet for Healthcare Providers: IncredibleEmployment.be  This test is not yet approved or cleared by the [place] FDA and has been authorized for detection and/or diagnosis of SARS-CoV-2 by FDA under an Emergency Use Authorization (EUA). This EUA will remain in effect (meaning this test can be used) for the duration of the COVID-19 declaration under Section 564(b)(1) of the Act, 21 U.S.C. section  360bbb-3(b)(1), unless the authorization is terminated or revoked.  Performed at [facility], [address], [place], [place] [address]   Blood Culture (routine x 2)     Status: None (Preliminary result)   Collection Time: 09/11/2020  3:29 PM   Specimen: BLOOD  Result Value Ref Range Status   Specimen Description BLOOD PORTA CATH  Final   Special Requests   Final    BOTTLES DRAWN  AEROBIC AND ANAEROBIC Blood Culture adequate volume   Culture   Final    NO GROWTH < 24 HOURS Performed at [facility], [address] [place], [place] [address]    Report Status PENDING  Incomplete  Blood Culture (routine x 2)     Status: None (Preliminary result)   Collection Time: 09/05/2020  3:38 PM   Specimen: BLOOD  Result Value Ref Range Status   Specimen Description BLOOD BLOOD RIGHT WRIST  Final   Special Requests   Final    BOTTLES DRAWN AEROBIC AND ANAEROBIC Blood Culture results may not be optimal due to an inadequate volume of blood received in culture bottles   Culture   Final    NO GROWTH < 24 HOURS Performed at [facility], [address], [place], [place] [address]    Report Status PENDING  Incomplete  MRSA PCR Screening     Status: None   Collection Time: 09/05/2020  3:59 PM   Specimen: Nasopharyngeal  Result Value Ref Range Status   MRSA by PCR NEGATIVE NEGATIVE Final    Comment:        The GeneXpert MRSA Assay (FDA approved for NASAL specimens only), is one component of a comprehensive MRSA colonization surveillance program. It is not intended to diagnose MRSA infection nor to guide or monitor treatment for MRSA infections. Performed at [facility], [address] [place], [place] [address]   Culture, blood (routine x 2)     Status: None (Preliminary result)   Collection Time: 09/13/20 12:51 AM   Specimen: BLOOD  Result Value Ref Range Status   Specimen Description BLOOD RIGHT ANTECUBITAL  Final   Special Requests   Final    BOTTLES DRAWN AEROBIC AND ANAEROBIC Blood  Culture adequate volume   Culture   Final    NO GROWTH < 12 HOURS Performed at [facility], [address] [place], [place] [address]    Report Status PENDING  Incomplete  Culture, blood (routine x 2)     Status: None (Preliminary result)   Collection Time: 09/13/20 12:51 AM   Specimen: BLOOD LEFT HAND  Result Value Ref Range Status   Specimen Description BLOOD LEFT HAND  Final   Special Requests   Final    BOTTLES DRAWN AEROBIC AND ANAEROBIC Blood Culture adequate volume   Culture   Final    NO GROWTH < 12 HOURS Performed at [facility], [address], [place], [place] [address]    Report Status PENDING  Incomplete     Radiology Studies: CT Angio Head W or Wo Contrast  Result Date: 09/01/2020 CLINICAL DATA:  Found unresponsive. Recent diagnosis of pancreatic cancer. Stroke suspected. EXAM: CT ANGIOGRAPHY HEAD AND NECK TECHNIQUE: Multidetector CT imaging of the head and neck was performed using the standard protocol during bolus administration of intravenous contrast. Multiplanar CT image reconstructions and MIPs were obtained to evaluate the vascular anatomy. Carotid stenosis measurements (when applicable) are obtained utilizing NASCET criteria, using the distal internal carotid diameter as the denominator. CONTRAST:  66mL OMNIPAQUE IOHEXOL 350 MG/ML SOLN COMPARISON:  Head CT same day FINDINGS: CTA NECK FINDINGS Aortic arch: Aortic atherosclerosis. No aneurysm or dissection. Branching pattern is normal. Mild atherosclerotic change at the brachiocephalic vessel origins. Right carotid system: Common carotid artery widely patent to the bifurcation. Calcified plaque at the carotid bifurcation and ICA bulb. No stenosis compared to the more distal cervical ICA diameter. Left carotid system: Common carotid artery is patent to the bifurcation with areas  of plaque but no significant narrowing. Carotid bifurcation shows calcified plaque. ICA bulb shows calcified plaque with minimal diameter of 2.5  mm. Compared to a more distal cervical ICA diameter of 4 mm, this indicates a 40% stenosis. Vertebral arteries: 30% stenosis of the proximal right subclavian artery. 50% stenosis of the right vertebral artery origin. Advanced calcified plaque of the left subclavian artery just proximal to the vertebral artery origin with stenosis of 70%. Severe stenosis of the proximal left vertebral artery. Beyond their origins, both vertebral arteries are widely patent through the cervical region to the foramen magnum. Skeleton: Ordinary cervical spondylosis. Other neck: No mass or lymphadenopathy. Upper chest: Widespread patchy infiltrates in the left upper lobe consistent with bronchopneumonia. Underlying emphysema and pulmonary scarring. Review of the MIP images confirms the above findings CTA HEAD FINDINGS Anterior circulation: Both internal carotid arteries patent through the skull base and siphon regions. Severe stenosis in the carotid siphon regions, worse on the left than the right. Near occlusion on the left. Supraclinoid internal carotid arteries show severe atherosclerotic narrowing, worse on the left than the right. Flow is present in both anterior and middle cerebral arteries. There is considerable narrowing and irregularity of the more distal branch vessels, worse on the right than the left. I do not identify an acute vascular occlusion. Posterior circulation: Both vertebral arteries are patent through the foramen magnum to the basilar. No basilar stenosis. Posterior circulation branch vessels show flow. Atherosclerotic narrowing and irregularity of the PCA branches. Venous sinuses: Patent and normal. Anatomic variants: None significant. Review of the MIP images confirms the above findings IMPRESSION: 1. No acute large or medium vessel occlusion. 2. Atherosclerotic disease at both carotid bifurcations. No stenosis on the right. 40% stenosis of the left ICA bulb. 3. 50% stenosis of the right vertebral artery origin.  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 Severe stenosis of the proximal left vertebral artery. 4. Severe stenosis in both carotid siphon and supraclinoid ICA regions. 5. Severe atherosclerotic narrowing and irregularity of the more distal intracranial branch vessels diffusely. 6. Widespread patchy infiltrates in the left upper lobe consistent with bronchopneumonia. 7. Emphysema and aortic atherosclerosis. Aortic Atherosclerosis (ICD10-I70.0) and Emphysema (ICD10-J43.9). Electronically Signed   By: Nelson Chimes M.D.   On: 09/17/2020 18:20   CT Head Wo Contrast  Result Date: 08/31/2020 CLINICAL DATA:  Unresponsive, history of pancreatic cancer EXAM: CT HEAD WITHOUT CONTRAST TECHNIQUE: Contiguous axial images were obtained from the base of the skull through the vertex without intravenous contrast. COMPARISON:  None. FINDINGS: Brain: Evaluation is limited by patient motion throughout the study. Focal hypodensities in the bilateral basal ganglia consistent with chronic lacunar infarcts. No signs of acute infarct or hemorrhage. Lateral ventricles and remaining midline structures are unremarkable. No acute extra-axial fluid collections. No mass effect. Vascular: No hyperdense vessel or unexpected calcification. Skull: Normal. Negative for fracture or focal lesion. Sinuses/Orbits: No acute finding. Other: None. IMPRESSION: 1. Limited evaluation due to patient motion. No acute intracranial process. Electronically Signed   By: Randa Ngo M.D.   On: 08/30/2020 16:41   CT Angio Neck W and/or Wo Contrast  Result Date: 09/18/2020 CLINICAL DATA:  Found unresponsive. Recent diagnosis of pancreatic cancer. Stroke suspected. EXAM: CT ANGIOGRAPHY HEAD AND NECK TECHNIQUE: Multidetector CT imaging of the head and neck was performed using the standard protocol during bolus administration of intravenous contrast. Multiplanar CT image  reconstructions and MIPs were obtained  to evaluate the vascular anatomy. Carotid stenosis measurements (when applicable) are obtained utilizing NASCET criteria, using the distal internal carotid diameter as the denominator. CONTRAST:  67mL OMNIPAQUE IOHEXOL 350 MG/ML SOLN COMPARISON:  Head CT same day FINDINGS: CTA NECK FINDINGS Aortic arch: Aortic atherosclerosis. No aneurysm or dissection. Branching pattern is normal. Mild atherosclerotic change at the brachiocephalic vessel origins. Right carotid system: Common carotid artery widely patent to the bifurcation. Calcified plaque at the carotid bifurcation and ICA bulb. No stenosis compared to the more distal cervical ICA diameter. Left carotid system: Common carotid artery is patent to the bifurcation with areas of plaque but no significant narrowing. Carotid bifurcation shows calcified plaque. ICA bulb shows calcified plaque with minimal diameter of 2.5 mm. Compared to a more distal cervical ICA diameter of 4 mm, this indicates a 40% stenosis. Vertebral arteries: 30% stenosis of the proximal right subclavian artery. 50% stenosis of the right vertebral artery origin. Advanced calcified plaque of the left subclavian artery just proximal to the vertebral artery origin with stenosis of 70%. Severe stenosis of the proximal left vertebral artery. Beyond their origins, both vertebral arteries are widely patent through the cervical region to the foramen magnum. Skeleton: Ordinary cervical spondylosis. Other neck: No mass or lymphadenopathy. Upper chest: Widespread patchy infiltrates in the left upper lobe consistent with bronchopneumonia. Underlying emphysema and pulmonary scarring. Review of the MIP images confirms the above findings CTA HEAD FINDINGS Anterior circulation: Both internal carotid arteries patent through the skull base and siphon regions. Severe stenosis in the carotid siphon regions, worse on the left than the right. Near occlusion on the left. Supraclinoid  internal carotid arteries show severe atherosclerotic narrowing, worse on the left than the right. Flow is present in both anterior and middle cerebral arteries. There is considerable narrowing and irregularity of the more distal branch vessels, worse on the right than the left. I do not identify an acute vascular occlusion. Posterior circulation: Both vertebral arteries are patent through the foramen magnum to the basilar. No basilar stenosis. Posterior circulation branch vessels show flow. Atherosclerotic narrowing and irregularity of the PCA branches. Venous sinuses: Patent and normal. Anatomic variants: None significant. Review of the MIP images confirms the above findings IMPRESSION: 1. No acute large or medium vessel occlusion. 2. Atherosclerotic disease at both carotid bifurcations. No stenosis on the right. 40% stenosis of the left ICA bulb. 3. 50% stenosis of the right vertebral artery origin. 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 Severe stenosis of the proximal left vertebral artery. 4. Severe stenosis in both carotid siphon and supraclinoid ICA regions. 5. Severe atherosclerotic narrowing and irregularity of the more distal intracranial branch vessels diffusely. 6. Widespread patchy infiltrates in the left upper lobe consistent with bronchopneumonia. 7. Emphysema and aortic atherosclerosis. Aortic Atherosclerosis (ICD10-I70.0) and Emphysema (ICD10-J43.9). Electronically Signed   By: Nelson Chimes M.D.   On: 09/15/2020 18:20   DG Chest Portable 1 View  Result Date: 09/16/2020 CLINICAL DATA:  Post intubation EXAM: PORTABLE CHEST 1 VIEW COMPARISON:  September 12, 2020 FINDINGS: The left subclavian Port-A-Cath is unchanged. The endotracheal tube terminates above the carina by approximately 4.4 cm. The enteric tube extends below the left hemidiaphragm. There is no pneumothorax. Bilateral hazy airspace opacities are noted. There  is no large pleural effusion. IMPRESSION: 1. Lines and tubes as above.  No pneumothorax. 2. Hazy bilateral airspace opacities which may represent multifocal pneumonia in the appropriate clinical setting.  Electronically Signed   By: Constance Holster M.D.   On: 08/29/2020 20:32   DG Chest Port 1 View  Result Date: 08/25/2020 CLINICAL DATA:  Sepsis, unresponsive, history of pancreatic cancer EXAM: PORTABLE CHEST 1 VIEW COMPARISON:  05/21/2020 FINDINGS: Single frontal view of the chest demonstrates an unremarkable cardiac silhouette. Left chest wall port unchanged. Chronic areas of scarring are seen throughout the lungs, stable. No effusion or pneumothorax. There is a prominent skin fold overlying the left chest. No acute bony abnormalities. IMPRESSION: 1. Chronic areas of scarring.  No acute intrathoracic process. 2. Prominent skin fold overlying left lung.  No pneumothorax. Electronically Signed   By: Randa Ngo M.D.   On: 09/11/2020 16:38    Scheduled Meds: . chlorhexidine gluconate (MEDLINE KIT)  15 mL Mouth Rinse BID  . Chlorhexidine Gluconate Cloth  6 each Topical Daily  . enoxaparin (LOVENOX) injection  40 mg Subcutaneous Q24H  . feeding supplement (PROSource TF)  90 mL Per Tube TID  . insulin aspart  0-15 Units Subcutaneous Q4H  . mouth rinse  15 mL Mouth Rinse 10 times per day  . pantoprazole (PROTONIX) IV  40 mg Intravenous Q12H  . sodium chloride flush  3 mL Intravenous Q12H   Continuous Infusions: . sodium chloride    . ceFEPime (MAXIPIME) IV 2 g (09/14/20 1559)  . dextrose 5 % and 0.9 % NaCl with KCl 40 mEq/L 75 mL/hr at 09/14/20 1258  . doxycycline (VIBRAMYCIN) IV Stopped (09/14/20 1153)  . feeding supplement (OSMOLITE 1.2 CAL) 1,000 mL (09/14/20 1007)     LOS: 2 days    Time spent: 35 minutes    Barton Dubois, MD [facility]   To contact the attending provider between 7A-7P or the covering provider during after hours 7P-7A, please log into the web site  [email or web address] and access using universal [place] password for that web site. If you do not have the password, please call the hospital operator.  09/14/2020, 4:02 PM

## 2020-09-14 NOTE — TOC Initial Note (Signed)
Transition of Care (TOC) - Initial/Assessment Note    Patient Details  Name: John Pizzi Sr. MRN: [id number] Date of Birth: [birth year]  Transition of Care [facility] CM/SW Contact:    Ihor Gully, LCSW Phone Number: 09/14/2020, 12:02 PM  Clinical Narrative:                 Pt admitted due to acute metabolic encephalopathy. Considered high risk for readmission. Lives with his wife. Pt is independent with ADLs. Pts does not drive, his wife provides his transportation. Active with [facility]. Patient had a wheelchair ordered on 12/17. Pt has a cane, walker, and a lifted toilet seat.  Patient has a history of difficulty complying with medication regime. Patient receives meals on wheels.  TOC to follow    Barriers to Discharge: Continued Medical Work up   Patient Goals and CMS Choice        Expected Discharge Plan and Services           Expected Discharge Date: 09/14/20                                    Prior Living Arrangements/Services                       Activities of Daily Living Home Assistive Devices/Equipment: Kasandra Knudsen (specify quad or straight),CBG Meter,Eyeglasses,Wheelchair,Walker (specify type) ADL Screening (condition at time of admission) Patient's cognitive ability adequate to safely complete daily activities?: Yes Is the patient deaf or have difficulty hearing?: No Does the patient have difficulty seeing, even when wearing glasses/contacts?: No Does the patient have difficulty concentrating, remembering, or making decisions?: No Patient able to express need for assistance with ADLs?: Yes Does the patient have difficulty dressing or bathing?: No Independently performs ADLs?: Yes (appropriate for developmental age) Does the patient have difficulty walking or climbing stairs?: Yes Weakness of Legs: Left Weakness of Arms/Hands: None  Permission Sought/Granted                  Emotional Assessment               Admission diagnosis:  Altered mental status, unspecified altered mental status type [K59.93] Acute metabolic encephalopathy [T70.17] Patient Active Problem List   Diagnosis Date Noted  . Acute metabolic encephalopathy 79/39/0300  . Hypoglycemia 2020-09-17  . Multifocal pneumonia 2020-09-17  . Hypoalbuminemia Sep 17, 2020  . Lactic acidosis 2020-09-17  . Elevated liver enzymes 09/17/20  . Prolonged QT interval Sep 17, 2020  . Sepsis ([place]) 09-17-20  . Mouth sore secondary to chemotherapy 07/28/2020  . 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 Genetic testing 06/30/2020  . Dehydration 06/09/2020  . Port-A-Cath in place 05/25/2020  . Pancreatic cancer metastasized to liver ([place])   . Goals of care, counseling/discussion 05/11/2020  . Family history of cervical cancer   . Pain of upper abdomen   . Hyperglycemia   . Pancreatic mass 04/24/2020  . Renal mass 04/24/2020  . Type 2 diabetes mellitus with peripheral neuropathy (HCC)   . Essential hypertension   . 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 Dyslipidemia   . Current smoker   . PAD (peripheral artery disease) ([place]) 07/20/2018   PCP:  Perlie Mayo, NP Pharmacy:   [facility], [place] - [address] [place] [address] Phone:  [phone] Fax: [phone]     Social Determinants of Health (SDOH) Interventions    Readmission Risk Interventions No flowsheet data found.

## 2020-09-14 NOTE — Consult Note (Signed)
Consultation Note Date: 09/14/2020   Patient Name: John Jasmin Sr.  DOB: 24-May-1951  MRN: [id number]  Age / Sex: 69 y.o., male  PCP: John Mayo, NP Referring Physician: Barton Dubois, MD  Reason for Consultation: Establishing goals of care  HPI/Patient Profile: 69 y.o. male  with past medical history of pancreatic cancer with mets to liver s/p 5 cycles of chemotherapy, critical leg ischemia s/p left BKA 2019, DM type 2, falls admitted on 09/24/2020 with unresponsiveness from home. Recent hospital admission for 12/15-12/18 due to DKA. EMS found CBG to be in the 30's. Intubated for airway protection. ICU admission for acute metabolic encephalopathy in the setting of hypoglycemia, severe sepsis, multifocal pneumonia receiving IV antibiotics. CT head negative for acute findings. Followed by oncology Dr. Burr Case s/p 5 cycles of palliative chemotherapy. Palliative medicine consultation for goals of care.   Clinical Assessment and Goals of Care:  I have reviewed medical records, discussed with care team, and met with wife [name]) and daughter [name]) at bedside. Patient remains intubated and on mechanical ventilation. Per RN, fentanyl infusion has been off for ~24 hours. Patient is poorly responsive this morning. Opens eyes non-purposefully. Does not appear to be in pain or discomfort.   I introduced Palliative Medicine as specialized medical care for people living with serious illness. It focuses on providing relief from the symptoms and stress of a serious illness. The goal is to improve quality of life for both the patient and the family.  We discussed a brief life review of the patient. [name] and [name] have 4 children and multiple grandchildren. 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 Functionally, fairly independent and able to complete ADL's. Appetite poor since initiation of chemotherapy.    Attempted to discuss journey since cancer diagnosis and previous conversations with oncology about his condition and prognosis. [name] and [name] change the subject frequently and hyper focused on his blood sugars. [name] does share that due to covid restrictions, she has had minimal communication with [name]'s oncology team.   Discussed events leading up to admission and course of hospitalization including diagnoses, interventions, plan of care. Discussed severity of his acute condition and with underlying metastatic pancreatic cancer. Attempted to explore their understanding of terminal nature of his cancer and chemotherapy being palliative NOT curative in nature. Also discussed concern with his mental status off a sedation and his ability to tolerate ventilator weans due to cognitive status. Unfortunately [name] and [name] seem to have poor insight on [name]'s terminal cancer and current severity of his condition.     I attempted to elicit values and goals of care important to the patient and family. [name] does not have a documented living will or POA. Wife is default POA. [name] shares that [name] has previously told her he wanted to be 'around for his grandkids.' [name] wishes for ongoing full code/full scope treatment. She is not ready to 'give up' on him or 'pull the plug.' She states "I want to try and save him" when discussing code  status.   Attempted to discuss consideration of limitations to care including DNR if his condition took a turn for the worst. [name] mentions a trach. Explained that ET tube/ventilator is short term and recommendation against tracheostomy/prolonged mechanical ventilation with terminal nature of his cancer. Also explained that if [name] need trach/vent to survive, it is likely that he will be dependent on others to care for him and require long-term SNF. [name] shares that people can go home with trachs and she previously took care of someone with a home trach.   Discussed watchful  waiting, time for outcomes. Reassured of ongoing conversations and support this admission with difficult decisions, especially if his cognitive status remains poor off of sedation.   Hard Choices booklet and PMT contact information given.     SUMMARY OF RECOMMENDATIONS    Patient does not have a documented living will or HCPOA. Wife is default HCPOA. Daughter, [name] involved and supportive.   Continue FULL code/FULL scope. Ongoing palliative discussions pending clinical course. Family with poor insight on cancer diagnosis/prognosis and severity of his current condition.   May need to consider EEG.  PMT will continue to follow.   Code Status/Advance Care Planning:  Full code  Symptom Management:   Per attending  Palliative Prophylaxis:   Aspiration, Delirium Protocol, Frequent Pain Assessment, Oral Care and Turn Reposition  Psycho-social/Spiritual:   Desire for further Chaplaincy support:yes  Additional Recommendations: Caregiving  Support/Resources, Compassionate Wean Education and Education on Hospice  Prognosis:   Tenuous  Discharge Planning: To Be Determined      Primary Diagnoses: Present on Admission: . Acute metabolic encephalopathy . Pancreatic cancer metastasized to liver (HCC) . Essential hypertension   I have reviewed the medical record, interviewed the patient and family, and examined the patient. The following aspects are pertinent.  Past Medical History:  Diagnosis Date  . Acute blood loss anemia   . Arthritis   . Benign essential HTN   . Cancer [facility])    pancreatic  . Cellulitis 07/20/2018  . Cellulitis in diabetic foot (HCC) 07/20/2018  . Diabetes mellitus without complication (HCC)   . Diabetes mellitus, type II (HCC) 07/20/2018  . Dry gangrene (HCC) 07/20/2018  . Emesis   . Family history of cervical cancer   . Heart murmur   . Hyperglycemia   . Hypertension   . Joint pain   . Port-A-Cath in place 05/25/2020  . Unilateral complete  BKA, left, initial encounter [facility])    Social History   Socioeconomic History  . Marital status: Married    Spouse name: [name]  . Number of children: 4  . Years of education: Not on file  . Highest education level: Not on file  Occupational History  . Not on file  Tobacco Use  . Smoking status: Current Every Day Smoker    Packs/day: 0.25    Types: Cigarettes  . Smokeless tobacco: Former Neurosurgeon    Types: Snuff    Quit date: 04/21/1989  Vaping Use  . Vaping Use: Never used  Substance and Sexual Activity  . Alcohol use: Not Currently    Alcohol/week: 0.0 standard drinks    Comment: stopped 2 years ago  . Drug use: No  . Sexual activity: Not Currently  Other Topics Concern  . Not on file  Social History Narrative   Lives with wife [name]- married 33 years    4 children with wife. All grown- live close by       15 Cats and 5  Dogs      Enjoys: walking when hip lets him, camping, likes being outside       Diet: eats all food groups    Caffeine: diet sodas 3    Water: 6-8 cups daily -gallon       Wears seat belt    Does not drive- daughter drove him 21 years ago lost license   No smoke detectors due to wood burning stove    Weapons- Sales executive          Social Determinants of Health   Financial Resource Strain: Low Risk   . Difficulty of Paying Living Expenses: Not hard at all  Food Insecurity: No Food Insecurity  . Worried About Charity fundraiser in the Last Year: Never true  . Ran Out of Food in the Last Year: Never true  Transportation Needs: No Transportation Needs  . Lack of Transportation (Medical): No  . Lack of Transportation (Non-Medical): No  Physical Activity: Inactive  . Days of Exercise per Week: 0 days  . Minutes of Exercise per Session: 0 min  Stress: No Stress Concern Present  . Feeling of Stress : Only a little  Social Connections: Moderately Isolated  . Frequency of Communication with Friends and Family: More than three times a week  . Frequency of  Social Gatherings with Friends and Family: Twice a week  . Attends Religious Services: Never  . Active Member of Clubs or Organizations: No  . Attends Archivist Meetings: Never  . Marital Status: Married   Family History  Problem Relation Age of Onset  . Alcoholism Mother   . Alcoholism Father   . Cervical cancer Sister        dx. late 70s  . Cervical cancer Sister        dx. late 79s   Scheduled Meds: . chlorhexidine gluconate (MEDLINE KIT)  15 mL Mouth Rinse BID  . Chlorhexidine Gluconate Cloth  6 each Topical Daily  . enoxaparin (LOVENOX) injection  40 mg Subcutaneous Q24H  . feeding supplement (PROSource TF)  90 mL Per Tube TID  . insulin aspart  0-15 Units Subcutaneous Q4H  . mouth rinse  15 mL Mouth Rinse 10 times per day  . pantoprazole (PROTONIX) IV  40 mg Intravenous Q12H  . sodium chloride flush  3 mL Intravenous Q12H   Continuous Infusions: . sodium chloride    . ceFEPime (MAXIPIME) IV Stopped (09/14/20 0843)  . dextrose 5 % and 0.9 % NaCl with KCl 40 mEq/L 75 mL/hr at 09/14/20 1258  . doxycycline (VIBRAMYCIN) IV Stopped (09/14/20 1153)  . feeding supplement (OSMOLITE 1.2 CAL) 1,000 mL (09/14/20 1007)   PRN Meds:.sodium chloride, fentaNYL (SUBLIMAZE) injection, sodium chloride flush Medications Prior to Admission:  Prior to Admission medications   Medication Sig Start Date End Date Taking? Authorizing Provider  insulin degludec (TRESIBA FLEXTOUCH) 100 UNIT/ML FlexTouch Pen Inject 25 Units into the skin at bedtime. 08/02/20  Yes Reardon, Juanetta Beets, NP  lipase/protease/amylase (CREON) 36000 UNITS CPEP capsule Take 1 capsule (36,000 Units total) by mouth 3 (three) times daily with meals AND 1 capsule (36,000 Units total) with snacks. 07/22/20  Yes Nida, Marella Chimes, MD  metFORMIN (GLUCOPHAGE) 500 MG tablet Take 1,000 mg by mouth 2 (two) times daily. 07/22/20  Yes [provider]  OXYCODONE HCL PO Take 5 mg by mouth every 6 (six) hours as needed  (moderate pain).   Yes [provider]  prochlorperazine (COMPAZINE) 10 MG tablet Take 1  tablet (10 mg total) by mouth every 6 (six) hours as needed for nausea or vomiting. 06/23/20  Yes Derek Jack, MD  tamsulosin (FLOMAX) 0.4 MG CAPS capsule Take 1 capsule (0.4 mg total) by mouth at bedtime. 07/30/20  Yes Jacquelin Hawking, NP  Accu-Chek Softclix Lancets lancets 1 each by Other route in the morning, at noon, in the evening, and at bedtime. Dx: E11.65    [provider]  acetaminophen (TYLENOL) 500 MG tablet Take 500-1,000 mg by mouth every 6 (six) hours as needed (for pain).     [provider]  bisacodyl (DULCOLAX) 5 MG EC tablet Take 5 mg by mouth daily as needed for moderate constipation.     [provider]  blood glucose meter kit and supplies KIT Dispense based on patient and insurance preference. Use up to four times daily as directed. (FOR ICD-9 250.00, 250.01). 07/08/20   Derek Jack, MD  Blood Glucose Monitoring Suppl (ACCU-CHEK GUIDE ME) w/Device KIT 1 Piece by Does not apply route as directed. 07/22/20   Cassandria Anger, MD  dexamethasone (DECADRON) 10 MG/ML injection Inject 10 mg into the vein every 14 (fourteen) days. Dexamethasone 10mg  in NS 50 ml IVPB prior to chemotherapy administration  05/26/20   [provider]  fluorouracil CALGB 63149 in sodium chloride 0.9 % 150 mL Inject 2,400 mg/m2 into the vein over 48 hr.  05/26/20   [provider]  fosaprepitant (EMEND) 150 MG SOLR injection Inject 150 mg into the vein every 14 (fourteen) days. Prior to chemotherapy administration  05/26/20   [provider]  glucose blood (ACCU-CHEK GUIDE) test strip Test BG 4 x daily. DX E11.65 07/22/20   Cassandria Anger, MD  Insulin Pen Needle 32G X 4 MM MISC Use to inject insulin 08/02/20   Brita Romp, NP  IRINOTECAN HCL IV Inject 280 mg/m2 into the vein every 14 (fourteen) days.  05/26/20   [provider]  LEUCOVORIN CALCIUM IV Inject 400 mg/m2 into the vein every 14 (fourteen) days.  05/26/20   [provider]  lidocaine-prilocaine (EMLA) cream Apply a small amount to port a cath site and cover with plastic wrap 1 hour prior to chemotherapy appointments 05/25/20   Derek Jack, MD  OXALIPLATIN IV Inject 85 mg/m2 into the vein every 14 (fourteen) days.  05/26/20   [provider]  palonosetron (ALOXI) 0.25 MG/5ML injection Inject 0.25 mg into the vein every 14 (fourteen) days. Prior to chemotherapy administration  05/26/20   [provider]  potassium chloride SA (KLOR-CON) 20 MEQ tablet Take 1 tablet (20 mEq total) by mouth 2 (two) times daily. Patient not taking: Reported on 09/14/2020 07/30/20   Jacquelin Hawking, NP   Allergies  Allergen Reactions  . Tea Itching and Rash   Review of Systems  Unable to perform ROS: Acuity of condition   Physical Exam Vitals and nursing note reviewed.  Constitutional:      Appearance: He is cachectic. He is ill-appearing.  HENT:     Head: Normocephalic and atraumatic.  Cardiovascular:     Rate and Rhythm: Normal rate.  Pulmonary:     Effort: No tachypnea, accessory muscle usage or respiratory distress.     Breath sounds: Normal breath sounds.     Comments: intubated Abdominal:     Tenderness: There is no abdominal tenderness.  Skin:    General: Skin is warm and dry.     Comments: Left BKA  Neurological:  Mental Status: He is lethargic.     Comments: Opens eyes non-purposefully. Not following commands    Vital Signs: BP (!) 145/87   Pulse 72   Temp (!) 96.5 F (35.8 C) (Axillary)   Resp 10   Ht $R'5\' 8"'Fa$  (1.727 m)   Wt 65.8 kg   SpO2 100%   BMI 22.06 kg/m  Pain Scale: CPOT   Pain Score: Asleep   SpO2: SpO2: 100 % O2 Device:SpO2: 100 % O2 Flow Rate: .O2 Flow Rate (L/min): 0 L/min  IO: Intake/output summary:   Intake/Output Summary (Last 24 hours) at 09/14/2020 1409 Last data filed at 09/14/2020  1258 Gross per 24 hour  Intake 3234.27 ml  Output 300 ml  Net 2934.27 ml    LBM: Last BM Date: 09/20/2020 Baseline Weight: Weight: 62 kg Most recent weight: Weight: 65.8 kg     Palliative Assessment/Data: PPS 30%   Flowsheet Rows   Flowsheet Row Most Recent Value  Intake Tab   Referral Department Hospitalist  Unit at Time of Referral ICU  Palliative Care Primary Diagnosis Cancer  Palliative Care Type New Palliative care  Reason for referral Clarify Goals of Care  Date first seen by Palliative Care 09/14/20  Clinical Assessment   Palliative Performance Scale Score 30%  Psychosocial & Spiritual Assessment   Palliative Care Outcomes   Patient/Family meeting held? Yes  Who was at the meeting? wife and daughter  Palliative Care Outcomes Clarified goals of care, Provided end of life care assistance, Provided psychosocial or spiritual support, ACP counseling assistance       Time Total: 75 Greater than 50%  of this time was spent counseling and coordinating care related to the above assessment and plan.  Signed by:  Ihor Dow, DNP, FNP-C Palliative Medicine Team  Phone: [phone] Fax: [phone]   Please contact Palliative Medicine Team phone at [phone] for questions and concerns.  For individual provider: See Shea Evans

## 2020-09-14 NOTE — Progress Notes (Signed)
Initial Nutrition Assessment  DOCUMENTATION CODES:      INTERVENTION:  Patient has OGT in place. Osmolite 1.2 is infusing @ 50 ml/hr (1200 ml q 24 hr). Add 90 ml Prosource TF via tube TID .   Tube feeding regimen currently providing 1680 kcal, 132.6 grams protein, and 984 ml H2O.    NUTRITION DIAGNOSIS:   Inadequate oral intake related to inability to eat as evidenced by NPO status.   GOAL:   Provide needs based on ASPEN/SCCM guidelines  MONITOR:   TF tolerance,Weight trends,Skin,I & O's,Labs,Vent status  REASON FOR ASSESSMENT:   Consult Enteral/tube feeding initiation and management  ASSESSMENT:  Patient is currently intubated on ventilator support to protect airway after presenting to ED unresponsive. Acute metabolic encephalopathy, hypoglycemia, pneumonia, severe sepsis.   Patient was weaning yesterday when RD talked with respiratory therapist. Per discussion with nursing this morning patient had to be returned to full support due non-responsivness.  History of left BKA, pancreatic cancer with metastasis to liver -undergoing chemotherapy, DM2 (hospitalized due to DKA 12/15->12/18).   Patient weight has ranged between 59-66 kg the past 2 months.  MV: 8.5 L/min Temp (24hrs), Avg:97.4 F (36.3 C), Min:96.7 F (35.9 C), Max:97.8 F (36.6 C)   Intake/Output Summary (Last 24 hours) at 09/14/2020 1109 Last data filed at 09/14/2020 0820 Gross per 24 hour  Intake 2473.06 ml  Output 300 ml  Net 2173.06 ml    Sedation: Fentanyl   Medications reviewed and include: insulin, protonix   IV-D5 NSCL with KCL @75  ml/hr  CBG (last 3)  Recent Labs    09/14/20 0003 09/14/20 0433 09/14/20 0818  GLUCAP 139* 65* 121*    Labs: BMP Latest Ref Rng & Units 09/13/2020 09/13/2020 09/13/2020  Glucose 70 - 99 mg/dL 58(L) 70 -  BUN 8 - 23 mg/dL 6(L) 7(L) -  Creatinine 0.61 - 1.24 mg/dL 0.42(L) 0.40(L) 0.44(L)  Sodium 135 - 145 mmol/L 133(L) 132(L) -  Potassium 3.5 - 5.1  mmol/L 2.8(L) 2.9(L) -  Chloride 98 - 111 mmol/L 104 103 -  CO2 22 - 32 mmol/L 23 22 -  Calcium 8.9 - 10.3 mg/dL 7.7(L) 7.6(L) -    Diet Order:   Diet Order            Diet NPO time specified  Diet effective now                 EDUCATION NEEDS:   Not appropriate for education at this time  Skin:  Skin Assessment: Reviewed RN Assessment (black hard area to back of right great toe (non-pressure) per nursing)  Last BM:  12/19  Height:   Ht Readings from Last 1 Encounters:  09/14/20 5\' 8"  (1.727 m)    Weight:   Wt Readings from Last 1 Encounters:  09/14/20 65.8 kg    Adjusted Ideal Body Weight:   68 kg  BMI:  Body mass index is 22.06 kg/m.  Estimated Nutritional Needs:   Kcal:  1625  Protein:  132-139 gr  Fluid:  >1600 ml daily  Colman Cater MS,RD,CSG,LDN Pager: Shea Evans

## 2020-09-14 NOTE — Progress Notes (Deleted)
To enter to visit and for information over the phone

## 2020-09-14 NOTE — Progress Notes (Signed)
NAME:  John Monnig., MRN:  [id number], DOB:  1951-05-17, LOS: 2 ADMISSION DATE:  08/29/2020, CONSULTATION DATE:  09/14/2020  REFERRING MD:  Molli Posey , CHIEF COMPLAINT: Intubated for airway protection  Brief History:  69 year old with metastatic pancreatic cancer on chemotherapy, insulin requiring diabetes type 2 brought in by EMS after being found unresponsive, CBG in the 30s, remained low on arrival to ED, intubated for airway protection chest x-ray shows of aspiration  History of Present Illness:  He has completed 5 cycles of chemotherapy for pancreatic cancer with metastasis to liver.  He had a recent admission 12/15-12/18 for DKA.  Patient apparently takes his own insulin, he is on 25 units of long-acting insulin at bedtime.  History is obtained per chart review.  Found to be unresponsive around 2 PM by his wife and EMS was called, found to be hypoglycemic in the 30s given D50 but remained hypoglycemic on arrival to the ED, started on D10.  Noted to be febrile and tachycardic.  Intubated for airway protection due to severe encephalopathy. 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5 F (35.8 C), temperature source Axillary, resp. rate 16, height 5\' 8"  (1.727 m), weight 65.8 kg, SpO2 100  %.    Vent Mode: CPAP FiO2 (%):  [35 %-40 %] 40 % Set Rate:  [15 bmp] 15 bmp Vt Set:  [600 mL] 600 mL PEEP:  [5 cmH20] 5 cmH20 Pressure Support:  [12 cmH20-13 cmH20] 12 cmH20 Plateau Pressure:  [8 cmH20-16 cmH20] 13 cmH20   Intake/Output Summary (Last 24 hours) at 09/14/2020 1315 Last data filed at 09/14/2020 1258 Gross per 24 hour  Intake 3234.27 ml  Output 300 ml  Net 2934.27 ml   Filed Weights   08/28/2020 1501 09/14/20 0512  Weight: 62 kg 65.8 kg    Examination: Gen:      elderly man, intubated, cachectic , no distress  HEENT:  EOMI, sclera anicteric, mild pallor Neck:     No JVD; no thyromegaly Lungs:   No accessory muscle use, bilateral ventilated breath sounds CV:         Regular rate and rhythm; no murmurs Abd:      + bowel sounds; soft, non-tender; no palpable masses, no distension Ext:    No edema; adequate peripheral perfusion Skin:      Warm and dry; no rash Neuro: Comatose, mild posturing to deep pain stimulus, right plantar upgoing  Chest x-ray 12/19 independently reviewed, ET tube in position, left subclavian Port-A-Cath Multifocal bilateral airspace disease    Resolved Hospital Problem list     Assessment & Plan:  Intubated for airway protection due to hypoglycemic encephalopathy no evidence of stroke on imaging  Acute respiratory failure -tolerates spontaneous breathing trials but mental status main barrier to extubation  Acute encephalopathy-was related to  hypoglycemia , no evidence of stroke on imaging, hypoglycemia  improved with dextrose drip -Keep off all sedation -Concern for hypoglycemic injury to brain, obtain EEG to ensure no seizures, no clinical evidence of seizure activity  Diabetes type 2 -recent hospitalization for DKA, basal insulin was to be decreased to 16 but seems like patient may have still been taking 25 units at home with Metformin -Like his insulin requirements have decreased with weight loss - will need to reassess his ability  to self administer insulin -Dextrose drip at 75/hour -Start tube feeds  Hypotension -concern for aspiration pneumonia -Resolved with IV fluids, doubt adrenal insufficiency -Continue cefepime, await respiratory culture  Pancreatic cancer/cachexia -Prognosis guarded  Best practice (evaluated daily)  Diet: NPO Pain/Anxiety/Delirium protocol (if indicated): As needed for now VAP protocol (if indicated): Y DVT prophylaxis: SCDs, can use Lovenox GI prophylaxis: Protonix Glucose control: Per triad Mobility: Bedrest Disposition: ICU  Goals of Care:   Code Status: Full, daughter updated at bedside 12/20  Labs   CBC: Recent Labs  Lab 09/08/20 1816 09/10/20 0616 09/11/20 0614 09/11/2020 1528 09/13/20 0051 09/13/20 0238  WBC 7.8 5.2 5.6 4.9 5.8 5.3  NEUTROABS 5.8 3.4 3.6 3.9  --   --   HGB 11.3* 9.8* 10.7* 11.1* 9.6* 10.0*  HCT 34.9* 31.3* 33.6* 34.4* 29.5* 30.5*  MCV 93.8 94.8 92.8 93.5 93.1 92.4  PLT 166 136* 143* 185 139* 135*    Basic Metabolic Panel: Recent Labs  Lab 09/08/20 1816 09/09/20 0242 09/10/20 0616 09/11/20 0614 08/25/2020 1528 09/13/20 0051 09/13/20 0238  NA 133*   < > 137 135 131* 132* 133*  K 4.1   < > 3.5 3.4* 3.6 2.9* 2.8*  CL 99   < > 101 100 100 103 104  CO2 25   < > 27 26 25 22 23   GLUCOSE 610*   < > 57* 51* 189* 70 58*  BUN 14   < > 10 8 8  7* 6*  CREATININE 0.70   < > 0.59* 0.52* 0.41* 0.40*  0.44* 0.42*  CALCIUM 9.0   < > 8.3* 8.2* 8.1* 7.6* 7.7*  MG 2.0  --   --  1.6*  --   --  1.6*  PHOS  --   --   --  2.9  --   --  3.0   < > = values in this interval not displayed.   GFR: Estimated Creatinine Clearance: 82.3 mL/min (A) (by C-G formula based on SCr of 0.42 mg/dL (L)). Recent Labs  Lab 09/11/20 0614 09/08/2020 1528 08/27/2020 1721 09/13/20 0051 09/13/20 0237 09/13/20 0238  PROCALCITON  --   --   --  0.28  --   --   WBC 5.6 4.9  --  5.8  --  5.3  LATICACIDVEN  --  1.9 2.5* 2.2* 1.3  --     Liver Function Tests: Recent Labs  Lab  09/08/20 1816 09/10/20 0616 09/11/20 0614 09/14/2020 1528 09/13/20 0238  AST 68* 70* 66* 71* 73*  ALT 34 38 39 44 38  ALKPHOS 309* 231* 288* 276* 232*  BILITOT 0.5 0.6 0.3 0.6 0.5  PROT 7.0 5.5* 5.8* 6.2* 5.5*  ALBUMIN 2.8* 2.2* 2.3* 2.6* 2.1*   No results for input(s): LIPASE, AMYLASE in the last 168 hours. No results for input(s): AMMONIA in the last 168 hours.  ABG    Component Value Date/Time   PHART 7.479 (H) 09/13/2020 0415   PCO2ART 30.5 (L) 09/13/2020 0415   PO2ART  163 (H) 09/13/2020 0415   HCO3 24.4 09/13/2020 0415   ACIDBASEDEF 0.7 09/13/2020 0415   O2SAT 99.0 09/13/2020 0415     Coagulation Profile: Recent Labs  Lab 09/09/2020 1528 09/13/20 0238  INR 0.9 1.1    Cardiac Enzymes: No results for input(s): CKTOTAL, CKMB, CKMBINDEX, TROPONINI in the last 168 hours.  HbA1C: Hemoglobin A1C  Date/Time Value Ref Range Status  08/02/2020 10:48 AM 9.9 (A) 4.0 - 5.6 % Final   Hgb A1c MFr Bld  Date/Time Value Ref Range Status  04/24/2020 12:07 PM 10.8 (H) 4.8 - 5.6 % Final    Comment:    (NOTE) Pre diabetes:          5.7%-6.4%  Diabetes:              >6.4%  Glycemic control for   <7.0% adults with diabetes   07/21/2018 07:15 AM 7.4 (H) 4.8 - 5.6 % Final    Comment:    (NOTE) Pre diabetes:          5.7%-6.4% Diabetes:              >6.4% Glycemic control for   <7.0% adults with diabetes     CBG: Recent Labs  Lab 09/13/20 2328 09/14/20 0003 09/14/20 0433 09/14/20 0818 09/14/20 1140  GLUCAP 51* 139* 65* 121* 119*     Critical care time: 81m     Kara Mead MD. FCCP. [facility] See Amion for pager  If no response to pager , please call [phone]  After 7:00 pm call Elink  [phone]   09/14/2020

## 2020-09-14 NOTE — Progress Notes (Signed)
EEG completed, results pending. 

## 2020-09-15 DIAGNOSIS — J9601 Acute respiratory failure with hypoxia: Secondary | ICD-10-CM | POA: Diagnosis not present

## 2020-09-15 DIAGNOSIS — J9600 Acute respiratory failure, unspecified whether with hypoxia or hypercapnia: Secondary | ICD-10-CM

## 2020-09-15 DIAGNOSIS — G9341 Metabolic encephalopathy: Secondary | ICD-10-CM | POA: Diagnosis not present

## 2020-09-15 DIAGNOSIS — J189 Pneumonia, unspecified organism: Secondary | ICD-10-CM | POA: Diagnosis not present

## 2020-09-15 DIAGNOSIS — C259 Malignant neoplasm of pancreas, unspecified: Secondary | ICD-10-CM | POA: Diagnosis not present

## 2020-09-15 DIAGNOSIS — R4182 Altered mental status, unspecified: Secondary | ICD-10-CM

## 2020-09-15 LAB — GLUCOSE, CAPILLARY
Glucose-Capillary: 188 mg/dL — ABNORMAL HIGH (ref 70–99)
Glucose-Capillary: 213 mg/dL — ABNORMAL HIGH (ref 70–99)
Glucose-Capillary: 217 mg/dL — ABNORMAL HIGH (ref 70–99)
Glucose-Capillary: 189 mg/dL — ABNORMAL HIGH (ref 70–99)
Glucose-Capillary: 227 mg/dL — ABNORMAL HIGH (ref 70–99)

## 2020-09-15 LAB — CREATININE, SERUM
Creatinine, Ser: 0.49 mg/dL — ABNORMAL LOW (ref 0.61–1.24)
GFR, Estimated: >60 mL/min (ref >60)

## 2020-09-15 MED ORDER — PANTOPRAZOLE SODIUM 40 MG PO PACK
40.0000 mg | PACK | Freq: Every day | ORAL | Status: DC
  Administered 2020-09-15 – 2020-09-16 (×2): 40 mg
  Filled 2020-09-15 (×2): qty 20

## 2020-09-15 NOTE — Procedures (Signed)
Patient Name: Chet Greenley Sr.  MRN: [id number]  Epilepsy Attending: Lora Havens  Referring Physician/Provider: Dr Kara Mead Date: 09/14/2020 Duration: 23.35 mins  Patient history: 69 year old male with altered mental status.  EEG to evaluate for seizures.  Level of alertness: comatose  AEDs during EEG study: None  Technical aspects: This EEG study was done with scalp electrodes positioned according to the 10-20 International system of electrode placement. Electrical activity was acquired at a sampling rate of 500Hz  and reviewed with a high frequency filter of 70Hz  and a low frequency filter of 1Hz . EEG data were recorded continuously and digitally stored.   Description: EEG showed continuous generalized 2-3 Hz delta slowing.  Periodic discharges with triphasic morphology at 1 Hz were also noted.  Hyperventilation and photic stimulation were not performed.     ABNORMALITY -Continuous slow, generalized -Periodic discharges with triphasic morphology, generalized  IMPRESSION: This study is suggestive of severe diffuse encephalopathy, nonspecific etiology but could be secondary to toxic-metabolic causes. Of note, at times periodic discharges with triphasic morphology can be on the ictal-interictal continuum but given the morphology and the frequency of these discharges it is less likely.  No seizures or definite epileptiform discharges were seen throughout the recording.  Jacoria Keiffer Barbra Sarks

## 2020-09-15 NOTE — Progress Notes (Signed)
Daily Progress Note   Patient Name: John Nunziato Sr.       Date: 09/15/2020 DOB: January 21, 1951  Age: 69 y.o. MRN#: [id number] Attending Physician: Rodena Goldmann, DO Primary Care Physician: Perlie Mayo, NP Admit Date: 08/29/2020  Reason for Consultation/Follow-up: Establishing goals of care  Subjective: Patient remains poorly responsive off of sedation. Intermittent posturing this AM. No s/s of pain or discomfort.   GOC:  GOC discussion with Dr. Manuella Case at bedside. Daughter, [name] present and wife, [name] on speaker phone.  Appreciate Dr. Manuella Case providing update on patient's condition including diagnoses, interventions, plan of care and unfortunate poor prognosis due to neurological status along with metastatic pancreatic cancer. Frankly and compassionately explained that patient is not a candidate for prolonged life-support measures (trach) due to terminal nature of his cancer. Discussed plan to continue watchful waiting for 2 more days before terminal extubating.   Family appropriately tearful. The patient has multiple siblings in [place] that would wish to see him before he dies. Encouraged [name] to call family to come as soon as possible, as we do not wish to prolong his suffering on the ventilator.   Stayed with [name] at bedside to further explain compassionate extubation, preparing ourselves for anything to happen at any time when decision is made to take the tube out. Explained that he may breath on his own for a short period of time but unlikelihood he will sustain respiratory drive long with severe encephalopathy on EEG following hypoglycemia.   Emotional support provided to daughter. [name] shares that her sister recently had a psych reading stating that someone in the family would be lost on Dec  26th. Therapeutic listening. Answered questions. PMT contact information given. [name] and family would appreciate visits from chaplain.    Length of Stay: 3  Current Medications: Scheduled Meds:  . chlorhexidine gluconate (MEDLINE KIT)  15 mL Mouth Rinse BID  . Chlorhexidine Gluconate Cloth  6 each Topical Daily  . enoxaparin (LOVENOX) injection  40 mg Subcutaneous Q24H  . feeding supplement (PROSource TF)  90 mL Per Tube TID  . insulin aspart  0-15 Units Subcutaneous Q4H  . mouth rinse  15 mL Mouth Rinse 10 times per day  . pantoprazole sodium  40 mg Per Tube Q1200  . sodium chloride flush  3 mL Intravenous Q12H  Continuous Infusions: . sodium chloride    . ceFEPime (MAXIPIME) IV Stopped (09/15/20 0835)  . doxycycline (VIBRAMYCIN) IV 100 mg (09/15/20 0934)  . feeding supplement (OSMOLITE 1.2 CAL) 1,000 mL (09/14/20 1007)    PRN Meds: sodium chloride, fentaNYL (SUBLIMAZE) injection, sodium chloride flush  Physical Exam Vitals and nursing note reviewed.  Constitutional:      Interventions: He is intubated.  Cardiovascular:     Rate and Rhythm: Normal rate.  Pulmonary:     Effort: No tachypnea, accessory muscle usage or respiratory distress. He is intubated.     Comments: Intubated/mechanical ventilation Skin:    General: Skin is warm and dry.  Neurological:     Comments: Comatose, posturing            Vital Signs: BP (!) 160/78   Pulse 77   Temp (!) 96.4 F (35.8 C) (Axillary)   Resp 16   Ht $R'5\' 8"'PT$  (1.727 m)   Wt 66.1 kg   SpO2 100%   BMI 22.16 kg/m  SpO2: SpO2: 100 % O2 Device: O2 Device: Ventilator O2 Flow Rate: O2 Flow Rate (L/min): 0 L/min  Intake/output summary:   Intake/Output Summary (Last 24 hours) at 09/15/2020 3536 Last data filed at 09/15/2020 1443 Gross per 24 hour  Intake 2827.94 ml  Output 1100 ml  Net 1727.94 ml   LBM: Last BM Date:  (PTA) Baseline Weight: Weight: 62 kg Most recent weight: Weight: 66.1 kg       Palliative  Assessment/Data: PPS 30%    Flowsheet Rows   Flowsheet Row Most Recent Value  Intake Tab   Referral Department Hospitalist  Unit at Time of Referral ICU  Palliative Care Primary Diagnosis Cancer  Palliative Care Type New Palliative care  Reason for referral Clarify Goals of Care  Date first seen by Palliative Care 09/14/20  Clinical Assessment   Palliative Performance Scale Score 30%  Psychosocial & Spiritual Assessment   Palliative Care Outcomes   Patient/Family meeting held? Yes  Who was at the meeting? wife and daughter  Palliative Care Outcomes Clarified goals of care, Provided end of life care assistance, Provided psychosocial or spiritual support, ACP counseling assistance      Patient Active Problem List   Diagnosis Date Noted  . Palliative care by specialist   . Acute metabolic encephalopathy 15/40/0867  . Hypoglycemia 08/29/2020  . Multifocal pneumonia 09/09/2020  . Hypoalbuminemia 09/23/2020  . Lactic acidosis 09/19/2020  . Elevated liver enzymes 09/23/2020  . Prolonged QT interval 08/26/2020  . Sepsis ([place]) 09/06/2020  . Mouth sore secondary to chemotherapy 07/28/2020  . 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 Genetic testing 06/30/2020  . Dehydration 06/09/2020  . Port-A-Cath in place 05/25/2020  . Pancreatic cancer metastasized to liver ([place])   . Goals of care, counseling/discussion 05/11/2020  . Family history of cervical cancer   . Pain of upper abdomen   . Hyperglycemia   . Pancreatic mass 04/24/2020  . Renal mass 04/24/2020  . Type 2 diabetes mellitus with peripheral neuropathy ([facility])   . Essential hypertension   . Left below-knee amputee ([place]) 08/05/2018  . Dyslipidemia   . Current smoker   . PAD (peripheral artery disease) ([place]) 07/20/2018    Palliative Care Assessment & Plan   Patient Profile: 69 y.o. male  with past medical history of pancreatic cancer with mets to  liver s/p 5 cycles of chemotherapy, critical leg ischemia s/p left BKA 2019, DM  type 2, falls admitted on 09/21/2020 with unresponsiveness from home. Recent hospital admission for 12/15-12/18 due to DKA. EMS found CBG to be in the 30's. Intubated for airway protection. ICU admission for acute metabolic encephalopathy in the setting of hypoglycemia, severe sepsis, multifocal pneumonia receiving IV antibiotics. CT head negative for acute findings. Followed by oncology Dr. Burr Medico s/p 5 cycles of palliative chemotherapy. Palliative medicine consultation for goals of care.   Assessment: Acute metabolic encephalopathy Hypoglycemia Multifocal pneumonia Severe sepsis Metastatic pancreatic cancer Prolonged QT  Recommendations/Plan: Ongoing palliative discussions. Appreciate Dr. Manuella Case providing frank but compassionate update to daughter and wife this AM, regarding diagnoses, results from EEG, and poor prognosis.  Multiple family members in [place] that would wish to see him before he dies. Local family will notify others to come as soon as possible. Allow another 48 hours of watchful waiting per PCCM and attending.  Will attempt further code status discussion tomorrow, as wife/daughter processing conversation with Dr. Manuella Case this morning.  Ongoing discussions to prepare family for compassionate extubation, likely by the end of this week.  Spiritual care consult for prayer and support.   Goals of Care and Additional Recommendations: Limitations on Scope of Treatment: Full Scope Treatment  Code Status: FULL   Code Status Orders  (From admission, onward)         Start     Ordered   09/19/2020 2252  Full code  Continuous        09/17/2020 2251        Code Status History    Date Active Date Inactive Code Status Order ID Comments User Context   09/08/2020 2120 09/11/2020 1849 Full Code [id number]  Bethena Roys, MD ED   04/24/2020 1827 04/26/2020 1807 Full Code [id number]  Vashti Hey, MD  ED   08/05/2018 1856 08/10/2018 1414 Full Code [id number]  Cathlyn Parsons, PA-C Inpatient   08/05/2018 1856 08/05/2018 1856 Full Code [id number]  Cathlyn Parsons, PA-C Inpatient   07/20/2018 0430 08/05/2018 1850 Full Code [id number]  Jani Gravel, MD ED   Advance Care Planning Activity      Prognosis: Tenuous  Discharge Planning: To Be Determined  Care plan was discussed with Dr. Manuella Ghazi, RN, daughter, wife  Thank you for allowing the Palliative Medicine Team to assist in the care of this patient.   Total Time 40 Prolonged Time Billed  no      Greater than 50%  of this time was spent counseling and coordinating care related to the above assessment and plan.  Ihor Dow, DNP, FNP-C Palliative Medicine Team  Phone: [phone] Fax: [phone]  Please contact Palliative Medicine Team phone at [phone] for questions and concerns.

## 2020-09-15 NOTE — Progress Notes (Signed)
PROGRESS NOTE    John Corbitt Sr.  QIW:[id number] DOB: 1951/01/02 DOA: 09/05/2020 PCP: Perlie Mayo, NP   Brief Narrative:  As per H&P written by Dr. 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 Theodoro Kalata presents to the emergency department via EMS due to being unresponsive at home. Patient was recently admitted on 12/15 and discharged on 12/18 due to DKA. Patient was unable to provide history due to being intubated and sedated, history was obtained fromdaughter at bedside,ED physician and ED medical record. Per report, patient went to use the restroom around4 AM this morning,around 10 AM, he told the son that he was going back to bed. Daughter at bedside called from work this afternoon to check on patient, and patient's wife tried to wake him up at2 PM today but hewaslethargic andunresponsive, EMS was activated, on arrival of EMS team, blood glucose was noted to be in the 30s, 1 amp of D50 was given in the field, but blood glucose was still 31 when patient arrived to the ED. Per daughter, a usedinsulin syringewas noted in the trash and patient was reported to have had only one sandwich last night.  ED Course: In the emergency department, he was febrile, tachycardic, and intermittently tachypneic. Several rounds of IV D50 were given due to hypoglycemia and patient was started on IV D10W. Teleneurology was consulted by ED physician, and CT of head without contrast done showed no acute intracranial process. CT angiography of head and neck showed no acute large or medium vessel occlusion. Patient was intubated due to inability to protect airway. Chest x-ray done showed hazy bilateral airspace opacities  which may represent multifocal pneumonia in the appropriate clinical setting. Patient was empirically started on IV antibiotics (vancomycin, doxycycline and cefepime). PCCM was consulted, no beds in [facility] and [facility],it was [facility] for [facility] labs and ABG by [facility] with plan to be seen here in the morning by PCCM. Hospitalist was asked to admit patient for further evaluation management.   Assessment & Plan:   Active Problems:   Essential hypertension   Pancreatic cancer metastasized to liver (HCC)   Port-A-Cath in place   Acute metabolic encephalopathy   Hypoglycemia   Multifocal pneumonia   Hypoalbuminemia   Lactic acidosis   Elevated liver enzymes   Prolonged QT interval   Sepsis ([place])   Palliative care by specialist   1-acute metabolic encephalopathy in the setting of hypoglycemia-persistent -CT head negative for acute stroke -Patient blood sugar remains to be low despite the use of D50 -Currently receiving D10 -Continue holding hypoglycemic agents -Patient received a stress dose Solu-Cortef -Continue to follow clinical response. -He has been intubated for airway protection-continue off sedation now 48 hours; plan to monitor 72 to 96 hours before full prognositication -EEG report with significant encephalopathic state reviewed with family on 12/22 -Extensive discussion with family regarding very poor prognosis and need for terminal extubation soon.  Additionally, not a candidate for tracheostomy.  Reviewed with PCCM.  2-multifocal aspiration pneumonia -With high concern for aspiration -Currently intubated -Continue cefepime and doxycycline for 5 days and await respiratory culture, plans for repeat chest x-ray in a.m. -Follow pulmonology recommendations for extubation purposes  3-severe sepsis: POA -organ dysfunction (his lungs, from PNA; ended requiring intubation). -Patient with need for intubation, lactic acidosis,  tachycardic, febrile and tachypneic meeting criteria for sepsis. -Continue antibiotics as mentioned above and supportive care -Vancomycin has been discontinued in the setting of negative MRSA PCR -Continue to maintain adequate hydration.  4-prolonged QT -Continue to follow extra lites and replete as needed -Avoid medications that can further prolong QT  5-metastatic pancreatic cancer -Continue outpatient follow-up by oncology service -Currently receiving palliative chemotherapy as an outpatient   DVT prophylaxis: Lovenox Code Status: Full code Family Communication: daughter at bedside Disposition:   Status is: Inpatient  Dispo: The patient is from: home  Anticipated d/c is to: To be determined  Anticipated d/c date is: To be determined  Patient currently no medically ready for discharge; patient still intubated, with soft blood pressure and is still requiring D10.  Overall prognosis is guarded/poor.    Consultants:   PCCM  Palliative care   Procedures:  See below for x-ray report.   Antimicrobials:  Cefepime Doxycycline  Subjective: Patient seen and evaluated today and he is unresponsive on ventilator.  Objective: Vitals:   09/15/20 1000 09/15/20 1100 09/15/20 1130 09/15/20 1200  BP: 129/71 (!) 145/94  (!) 151/101  Pulse: 64 77 85 86  Resp: _0 Temp:   (!) 96.4 F (35.8 C)   TempSrc:   Axillary   SpO2: 100% 100% 100% 100%  Weight:      Height:        Intake/Output Summary (Last 24 hours) at 09/15/2020 1251 Last data filed at 09/15/2020 1202 Gross per 24 hour  Intake 3105.52 ml  Output 1450 ml  Net 1655.52 ml   Filed Weights   09/16/2020 1501 09/14/20 0512 09/15/20 0452  Weight: 62 kg 65.8 kg 66.1 kg    Examination:  General exam: Unresponsive and posturing Respiratory system: Clear to auscultation. Respiratory effort normal.  Currently intubated on FiO2 40%. Cardiovascular system: S1 & S2  heard, RRR.  Gastrointestinal system: Abdomen is soft Central nervous system: Unresponsive Extremities: No edema, left BKA. Skin: No significant lesions noted Psychiatry: Cannot be assessed.    Data Reviewed: I have personally reviewed following labs and imaging studies  CBC: Recent Labs  Lab 09/08/20 1816 09/10/20 0616 09/11/20 0614 09/20/2020 1528 09/13/20 0051 09/13/20 0238  WBC 7.8 5.2 5.6 4.9 5.8 5.3  NEUTROABS 5.8 3.4 3.6 3.9  --   --   HGB 11.3* 9.8* 10.7* 11.1* 9.6* 10.0*  HCT 34.9* 31.3* 33.6* 34.4* 29.5* 30.5*  MCV 93.8 94.8 92.8 93.5 93.1 92.4  PLT 166 136* 143* 185 139* 155*   Basic Metabolic Panel: Recent Labs  Lab 09/08/20 1816 09/09/20 0242 09/10/20 0616 09/11/20 0614 09/10/2020 1528 09/13/20 0051 09/13/20 0238 09/15/20 0429  NA 133*   < > 137 135 131* 132* 133*  --   K 4.1   < > 3.5 3.4* 3.6 2.9* 2.8*  --   CL 99   < > 101 100 100 103 104  --   CO2 25   < > _1 --   GLUCOSE 610*   < > 57* 51* 189* 70 58*  --   BUN 14   < > _2 7* 6*  --   CREATININE 0.70   < > 0.59* 0.52* 0.41* 0.40*   0.44* 0.42* 0.49*  CALCIUM 9.0   < > 8.3* 8.2* 8.1* 7.6* 7.7*  --   MG 2.0  --   --  1.6*  --   --  1.6*  --  PHOS  --   --   --  2.9  --   --  3.0  --    < > = values in this interval not displayed.   GFR: Estimated Creatinine Clearance: 82.6 mL/min (A) (by C-G formula based on SCr of 0.49 mg/dL (L)). Liver Function Tests: Recent Labs  Lab 09/08/20 1816 09/10/20 0616 09/11/20 0614 09/21/2020 1528 09/13/20 0238  AST 68* 70* 66* 71* 73*  ALT 34 38 39 44 38  ALKPHOS 309* 231* 288* 276* 232*  BILITOT 0.5 0.6 0.3 0.6 0.5  PROT 7.0 5.5* 5.8* 6.2* 5.5*  ALBUMIN 2.8* 2.2* 2.3* 2.6* 2.1*   No results for input(s): LIPASE, AMYLASE in the last 168 hours. No results for input(s): AMMONIA in the last 168 hours. Coagulation Profile: Recent Labs  Lab 09/15/2020 1528 09/13/20 0238  INR 0.9 1.1   Cardiac Enzymes: No results for input(s): CKTOTAL,  CKMB, CKMBINDEX, TROPONINI in the last 168 hours. BNP (last 3 results) No results for input(s): PROBNP in the last 8760 hours. HbA1C: No results for input(s): HGBA1C in the last 72 hours. CBG: Recent Labs  Lab 09/14/20 1953 09/14/20 2315 09/15/20 0356 09/15/20 0727 09/15/20 1129  GLUCAP 200* 216* 188* 213* 217*   Lipid Profile: No results for input(s): CHOL, HDL, LDLCALC, TRIG, CHOLHDL, LDLDIRECT in the last 72 hours. Thyroid Function Tests: No results for input(s): TSH, T4TOTAL, FREET4, T3FREE, THYROIDAB in the last 72 hours. Anemia Panel: No results for input(s): VITAMINB12, FOLATE, FERRITIN, TIBC, IRON, RETICCTPCT in the last 72 hours. Sepsis Labs: Recent Labs  Lab 09/15/2020 1528 09/20/2020 1721 09/13/20 0051 09/13/20 0237  PROCALCITON  --   --  0.28  --   LATICACIDVEN 1.9 2.5* 2.2* 1.3    Recent Results (from the past 240 hour(s))  Resp Panel by RT-PCR (Flu A&B, Covid) Nasopharyngeal Swab     Status: None   Collection Time: 09/08/20  7:24 PM   Specimen: Nasopharyngeal Swab; Nasopharyngeal(NP) swabs in vial transport medium  Result Value Ref Range Status   SARS Coronavirus 2 by RT PCR NEGATIVE NEGATIVE Final    Comment: (NOTE) SARS-CoV-2 target nucleic acids are NOT DETECTED.  The SARS-CoV-2 RNA is generally detectable in upper respiratory specimens during the acute phase of infection. The lowest concentration of SARS-CoV-2 viral copies this assay can detect is 138 copies/mL. A negative result does not preclude SARS-Cov-2 infection and should not be used as the sole basis for treatment or other patient management decisions. A negative result may occur with  improper specimen collection/handling, submission of specimen other than nasopharyngeal swab, presence of viral mutation(s) within the areas targeted by this assay, and inadequate number of viral copies(<138 copies/mL). A negative result must be combined with clinical observations, patient history, and  epidemiological information. The expected result is Negative.  Fact Sheet for Patients:  EntrepreneurPulse.com.au  Fact Sheet for Healthcare Providers:  IncredibleEmployment.be  This test is no t yet approved or cleared by the [place] FDA and  has been authorized for detection and/or diagnosis of SARS-CoV-2 by FDA under an Emergency Use Authorization (EUA). This EUA will remain  in effect (meaning this test can be used) for the duration of the COVID-19 declaration under Section 564(b)(1) of the Act, 21 U.S.C.section 360bbb-3(b)(1), unless the authorization is terminated  or revoked sooner.       Influenza A by PCR NEGATIVE NEGATIVE Final   Influenza B by PCR NEGATIVE NEGATIVE Final    Comment: (NOTE) The Xpert Xpress SARS-CoV-2/FLU/RSV plus  assay is intended as an aid in the diagnosis of influenza from Nasopharyngeal swab specimens and should not be used as a sole basis for treatment. Nasal washings and aspirates are unacceptable for Xpert Xpress SARS-CoV-2/FLU/RSV testing.  Fact Sheet for Patients: EntrepreneurPulse.com.au  Fact Sheet for Healthcare Providers: IncredibleEmployment.be  This test is not yet approved or cleared by the [place] FDA and has been authorized for detection and/or diagnosis of SARS-CoV-2 by FDA under an Emergency Use Authorization (EUA). This EUA will remain in effect (meaning this test can be used) for the duration of the COVID-19 declaration under Section 564(b)(1) of the Act, 21 U.S.C. section 360bbb-3(b)(1), unless the authorization is terminated or revoked.  Performed at [facility], [address], [place], [place] [address]   Urine culture     Status: None   Collection Time: 09/19/2020  3:20 PM   Specimen: In/Out Cath Urine  Result Value Ref Range Status   Specimen Description   Final    IN/OUT CATH URINE Performed at [facility], [address]  [place], [place] [address]    Special Requests   Final    NONE Performed at [facility], [address] [place], [place] [address]    Culture   Final    NO GROWTH Performed at [facility], [place] [address] [place], [place] [address]    Report Status 09/13/2020 FINAL  Final  Resp Panel by RT-PCR (Flu A&B, Covid) Nasopharyngeal Swab     Status: None   Collection Time: 09/05/2020  3:27 PM   Specimen: Nasopharyngeal Swab; Nasopharyngeal(NP) swabs in vial transport medium  Result Value Ref Range Status   SARS Coronavirus 2 by RT PCR NEGATIVE NEGATIVE Final    Comment: (NOTE) SARS-CoV-2 target nucleic acids are NOT DETECTED.  The SARS-CoV-2 RNA is generally detectable in upper respiratory specimens during the acute phase of infection. The lowest concentration of SARS-CoV-2 viral copies this assay can detect is 138 copies/mL. A negative result does not preclude SARS-Cov-2 infection and should not be used as the sole basis for treatment or other patient management decisions. A negative result may occur with  improper specimen collection/handling, submission of specimen other than nasopharyngeal swab, presence of viral mutation(s) within the areas targeted by this assay, and inadequate number of viral copies(<138 copies/mL). A negative result must be combined with clinical observations, patient history, and epidemiological information. The expected result is Negative.  Fact Sheet for Patients:  EntrepreneurPulse.com.au  Fact Sheet for Healthcare Providers:  IncredibleEmployment.be  This test is no t yet approved or cleared by the [place] FDA and  has been authorized for detection and/or diagnosis of SARS-CoV-2 by FDA under an Emergency Use Authorization (EUA). This EUA will remain  in effect (meaning this test can be used) for the duration of the COVID-19 declaration under Section 564(b)(1) of the Act, 21 U.S.C.section 360bbb-3(b)(1), unless  the authorization is terminated  or revoked sooner.       Influenza A by PCR NEGATIVE NEGATIVE Final   Influenza B by PCR NEGATIVE NEGATIVE Final    Comment: (NOTE) The Xpert Xpress SARS-CoV-2/FLU/RSV plus assay is intended as an aid in the diagnosis of influenza from Nasopharyngeal swab specimens and should not be used as a sole basis for treatment. Nasal washings and aspirates are unacceptable for Xpert Xpress SARS-CoV-2/FLU/RSV testing.  Fact Sheet for Patients: EntrepreneurPulse.com.au  Fact Sheet for Healthcare Providers: IncredibleEmployment.be  This test is not yet approved or cleared by the [place] and  has been authorized for detection and/or diagnosis of SARS-CoV-2 by FDA under an Emergency Use Authorization (EUA). This EUA will remain in effect (meaning this test can be used) for the duration of the COVID-19 declaration under Section 564(b)(1) of the Act, 21 U.S.C. section 360bbb-3(b)(1), unless the authorization is terminated or revoked.  Performed at [facility], [address] [place], [place] [address]   Blood Culture (routine x 2)     Status: None (Preliminary result)   Collection Time: 08/28/2020  3:29 PM   Specimen: BLOOD  Result Value Ref Range Status   Specimen Description BLOOD PORTA CATH  Final   Special Requests   Final    BOTTLES DRAWN AEROBIC AND ANAEROBIC Blood Culture adequate volume   Culture   Final    NO GROWTH 3 DAYS Performed at [facility], [address], [place], [place] [address]    Report Status PENDING  Incomplete  Blood Culture (routine x 2)     Status: None (Preliminary result)   Collection Time: 09/19/2020  3:38 PM   Specimen: BLOOD  Result Value Ref Range Status   Specimen Description BLOOD BLOOD RIGHT WRIST  Final   Special Requests   Final    BOTTLES DRAWN AEROBIC AND ANAEROBIC Blood Culture results may not be optimal due to an inadequate volume of blood received in culture bottles    Culture   Final    NO GROWTH 3 DAYS Performed at [facility], [address] [place], [place] [address]    Report Status PENDING  Incomplete  MRSA PCR Screening     Status: None   Collection Time: 09/22/2020  3:59 PM   Specimen: Nasopharyngeal  Result Value Ref Range Status   MRSA by PCR NEGATIVE NEGATIVE Final    Comment:        The GeneXpert MRSA Assay (FDA approved for NASAL specimens only), is one component of a comprehensive MRSA colonization surveillance program. It is not intended to diagnose MRSA infection nor to guide or monitor treatment for MRSA infections. Performed at [facility], [address], [place], [place] [address]   Culture, blood (routine x 2)     Status: None (Preliminary result)   Collection Time: 09/13/20 12:51 AM   Specimen: BLOOD  Result Value Ref Range Status   Specimen Description BLOOD RIGHT ANTECUBITAL  Final   Special Requests   Final    BOTTLES DRAWN AEROBIC AND ANAEROBIC Blood Culture adequate volume   Culture   Final    NO GROWTH 2 DAYS Performed at [facility], [address], [place], [place] [address]    Report Status PENDING  Incomplete  Culture, blood (routine x 2)     Status: None (Preliminary result)   Collection Time: 09/13/20 12:51 AM   Specimen: BLOOD LEFT HAND  Result Value Ref Range Status   Specimen Description BLOOD LEFT HAND  Final   Special Requests   Final    BOTTLES DRAWN AEROBIC AND ANAEROBIC Blood Culture adequate volume   Culture   Final    NO GROWTH 2 DAYS Performed at [facility], [address] [place], [place] [address]    Report Status PENDING  Incomplete         Radiology Studies: EEG adult  Result Date: 2020/09/24 Lora Havens, MD     09/24/20  9:52 AM Patient Name: John Islam Sr. MRN: [id number] Epilepsy Attending: Lora Havens Referring Physician/Provider: Dr Kara Mead Date: 09/14/2020 Duration: 23.35 mins Patient history: 69 year old male  with altered mental status.  EEG to  evaluate for seizures. Level of alertness: comatose AEDs during EEG study: None Technical aspects: This EEG study was done with scalp electrodes positioned according to the 10-20 International system of electrode placement. Electrical activity was acquired at a sampling rate of _0  and reviewed with a high frequency filter of _1  and a low frequency filter of _2 . EEG data were recorded continuously and digitally stored. Description: EEG showed continuous generalized 2-3 Hz delta slowing.  Periodic discharges with triphasic morphology at 1 Hz were also noted.  Hyperventilation and photic stimulation were not performed.   ABNORMALITY -Continuous slow, generalized -Periodic discharges with triphasic morphology, generalized IMPRESSION: This study is suggestive of severe diffuse encephalopathy, nonspecific etiology but could be secondary to toxic-metabolic causes. Of note, at times periodic discharges with triphasic morphology can be on the ictal-interictal continuum but given the morphology and the frequency of these discharges it is less likely.  No seizures or definite epileptiform discharges were seen throughout the recording. Priyanka Barbra Sarks        Scheduled Meds:  chlorhexidine gluconate (MEDLINE KIT)  15 mL Mouth Rinse BID   Chlorhexidine Gluconate Cloth  6 each Topical Daily   enoxaparin (LOVENOX) injection  40 mg Subcutaneous Q24H   feeding supplement (PROSource TF)  90 mL Per Tube TID   insulin aspart  0-15 Units Subcutaneous Q4H   mouth rinse  15 mL Mouth Rinse 10 times per day   pantoprazole sodium  40 mg Per Tube Q1200   sodium chloride flush  3 mL Intravenous Q12H   Continuous Infusions:  sodium chloride     ceFEPime (MAXIPIME) IV Stopped (09/15/20 0835)   doxycycline (VIBRAMYCIN) IV Stopped (09/15/20 1134)   feeding supplement (OSMOLITE 1.2 CAL) 1,000 mL (09/14/20 1007)     LOS: 3 days    Time spent: 35 minutes    Alexcis Bicking Darleen Crocker, DO [facility]  If  7PM-7AM, please contact night-coverage [email or web address] 09/15/2020, 12:51 PM

## 2020-09-15 NOTE — Progress Notes (Signed)
NAME:  John Degroff., MRN:  [id number], DOB:  Oct 18, 1950, LOS: 3 ADMISSION DATE:  09/04/2020, CONSULTATION DATE:  09/15/2020  REFERRING MD:  Molli Posey , CHIEF COMPLAINT: Intubated for airway protection  Brief History:  69 year old with metastatic pancreatic cancer on chemotherapy, insulin requiring diabetes type 2 brought in by EMS after being found unresponsive, CBG in the 30s, remained low on arrival to ED, intubated for airway protection chest x-ray shows of aspiration  History of Present Illness:  He has completed 5 cycles of chemotherapy for pancreatic cancer with metastasis to liver.  He had a recent admission 12/15-12/18 for DKA.  Patient apparently takes his own insulin, he is on 25 units of long-acting insulin at bedtime.  History is obtained per chart review.  Found to be unresponsive around 2 PM by his wife and EMS was called, found to be hypoglycemic in the 30s given D50 but remained hypoglycemic on arrival to the ED, started on D10.  Noted to be febrile and tachycardic.  Intubated for airway protection due to severe encephalopathy. 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 rate 14, height 5\' 8"  (1.727 m), weight 66.1 kg, SpO2 100 %.    Vent Mode:  CPAP;PSV FiO2 (%):  [35 %-40 %] 35 % Set Rate:  [15 bmp] 15 bmp Vt Set:  [600 mL] 600 mL PEEP:  [5 cmH20] 5 cmH20 Pressure Support:  [10 VVO16-07 cmH20] 10 cmH20 Plateau Pressure:  [8 cmH20-13 cmH20] 13 cmH20   Intake/Output Summary (Last 24 hours) at 09/15/2020 0900 Last data filed at 09/15/2020 3710 Gross per 24 hour  Intake 2655.86 ml  Output 1100 ml  Net 1555.86 ml   Filed Weights   09/16/2020 1501 09/14/20 0512 09/15/20 0452  Weight: 62 kg 65.8 kg 66.1 kg    Examination: Gen:      elderly man, intubated, cachectic , no distress  HEENT:  EOMI, sclera anicteric, mild pallor Neck:     No JVD; no thyromegaly Lungs:   Bilateral ventilated breath sounds, no accessory muscle use CV:         Regular rate and rhythm; no murmurs Abd:      + bowel sounds; soft, non-tender; no palpable masses, no distension Ext:    Left BKA, no edema; adequate peripheral perfusion Skin:      Warm and dry; no rash Neuro: Comatose, includes eyes to deep pain stimulus and decerebrate posturing, no localization, right plantar  Chest x-ray 12/19 independently reviewed, ET tube in position, left subclavian Port-A-Cath Multifocal bilateral airspace disease  CBGs 1 82-210  Resolved Hospital Problem list     Assessment & Plan:  Intubated for airway protection due to hypoglycemic encephalopathy no evidence of stroke on imaging  Acute respiratory failure -tolerates spontaneous breathing trials but mental status  main barrier to extubation  Acute encephalopathy-was related to hypoglycemia , no evidence of stroke on imaging,  -Keep off all sedation -Exam with posturing weakness concerning for hypoglycemic injury to brain, Await EEG to ensure no seizures, no clinical evidence of seizure activity  Diabetes type 2 -recent hospitalization for DKA, basal insulin was to be decreased to 16 but seems like patient may have still been taking 25 units at home with Metformin -Likely his insulin requirements have  decreased with weight loss -Now that sugars rising with tube feeds, can discontinue dextrose drip  Hypotension -concern for aspiration pneumonia -Resolved with IV fluids, doubt adrenal insufficiency -Continue cefepime x 5ds await respiratory culture, repeat chest x-ray in a.m.  Pancreatic cancer/cachexia -was on palliative chemotherapy   Goals of care conversation ongoing, appreciate palliative care input.  He seems to have hypoglycemic injury and now has been off sedation for 48 hours without significant improvement, does not follow commands and continues to posture.  This likely indicates permanent injury but would like to give him 72 to 96 hours before neuro prognosticating.  He tolerates spontaneous breathing but likely will not be able to maintain airway post extubation.  As such, I would continue palliative conversations with goal for 1 way extubation.  Tracheostomy would not be an option given his terminal cancer  Best practice (evaluated daily)  Diet: NPO Pain/Anxiety/Delirium protocol (if indicated):fent  As needed VAP protocol (if indicated): Y DVT prophylaxis: SCDs, can use Lovenox GI prophylaxis: Protonix Glucose control: Per triad Mobility: Bedrest Disposition: ICU  Goals of Care:   Code Status: Full, daughter updated at bedside 12/20  Labs   CBC: Recent Labs  Lab 09/08/20 1816 09/10/20 0616 09/11/20 0614 08/30/2020 1528 09/13/20 0051 09/13/20 0238  WBC 7.8 5.2 5.6 4.9 5.8 5.3  NEUTROABS 5.8 3.4 3.6 3.9  --   --   HGB 11.3* 9.8* 10.7* 11.1* 9.6* 10.0*  HCT 34.9* 31.3* 33.6* 34.4* 29.5* 30.5*  MCV 93.8 94.8 92.8 93.5 93.1 92.4  PLT 166 136* 143* 185 139* 135*    Basic Metabolic Panel: Recent Labs  Lab 09/08/20 1816 09/09/20 0242 09/10/20 0616 09/11/20 0614 08/31/2020 1528 09/13/20 0051 09/13/20 0238 09/15/20 0429  NA 133*   < > 137 135 131* 132* 133*  --   K 4.1   < > 3.5 3.4* 3.6 2.9* 2.8*  --   CL 99   < > 101 100 100 103 104  --   CO2 25   < > 27  26 25 22 23   --   GLUCOSE 610*   < > 57* 51* 189* 70 58*  --   BUN 14   < > 10 8 8  7* 6*  --   CREATININE 0.70   < > 0.59* 0.52* 0.41* 0.40*  0.44* 0.42* 0.49*  CALCIUM 9.0   < > 8.3* 8.2* 8.1* 7.6* 7.7*  --   MG 2.0  --   --  1.6*  --   --  1.6*  --   PHOS  --   --   --  2.9  --   --  3.0  --    < > = values in this interval not displayed.   GFR: Estimated Creatinine Clearance: 82.6 mL/min (A) (by C-G formula based on SCr of 0.49 mg/dL (L)). Recent Labs  Lab 09/11/20 0614 08/26/2020 1528 09/14/2020 1721 09/13/20 0051 09/13/20 0237 09/13/20 0238  PROCALCITON  --   --   --  0.28  --   --  WBC 5.6 4.9  --  5.8  --  5.3  LATICACIDVEN  --  1.9 2.5* 2.2* 1.3  --     Liver Function Tests: Recent Labs  Lab 09/08/20 1816 09/10/20 0616 09/11/20 0614 09/24/2020 1528 09/13/20 0238  AST 68* 70* 66* 71* 73*  ALT 34 38 39 44 38  ALKPHOS 309* 231* 288* 276* 232*  BILITOT 0.5 0.6 0.3 0.6 0.5  PROT 7.0 5.5* 5.8* 6.2* 5.5*  ALBUMIN 2.8* 2.2* 2.3* 2.6* 2.1*   No results for input(s): LIPASE, AMYLASE in the last 168 hours. No results for input(s): AMMONIA in the last 168 hours.  ABG    Component Value Date/Time   PHART 7.479 (H) 09/13/2020 0415   PCO2ART 30.5 (L) 09/13/2020 0415   PO2ART 163 (H) 09/13/2020 0415   HCO3 24.4 09/13/2020 0415   ACIDBASEDEF 0.7 09/13/2020 0415   O2SAT 99.0 09/13/2020 0415     Coagulation Profile: Recent Labs  Lab 08/29/2020 1528 09/13/20 0238  INR 0.9 1.1    Cardiac Enzymes: No results for input(s): CKTOTAL, CKMB, CKMBINDEX, TROPONINI in the last 168 hours.  HbA1C: Hemoglobin A1C  Date/Time Value Ref Range Status  08/02/2020 10:48 AM 9.9 (A) 4.0 - 5.6 % Final   Hgb A1c MFr Bld  Date/Time Value Ref Range Status  04/24/2020 12:07 PM 10.8 (H) 4.8 - 5.6 % Final    Comment:    (NOTE) Pre diabetes:          5.7%-6.4%  Diabetes:              >6.4%  Glycemic control for   <7.0% adults with diabetes   07/21/2018 07:15 AM 7.4 (H) 4.8 - 5.6 %  Final    Comment:    (NOTE) Pre diabetes:          5.7%-6.4% Diabetes:              >6.4% Glycemic control for   <7.0% adults with diabetes     CBG: Recent Labs  Lab 09/14/20 1618 09/14/20 1953 09/14/20 2315 09/15/20 0356 09/15/20 0727  GLUCAP 173* 200* 216* 188* 213*     Critical care time: 49m     Kara Mead MD. FCCP. [facility] See Amion for pager  If no response to pager , please call [phone]  After 7:00 pm call Elink  [phone]   09/15/2020

## 2020-09-16 ENCOUNTER — Inpatient Hospital Stay (HOSPITAL_COMMUNITY): Payer: Medicare Other

## 2020-09-16 DIAGNOSIS — J189 Pneumonia, unspecified organism: Secondary | ICD-10-CM | POA: Diagnosis not present

## 2020-09-16 DIAGNOSIS — G9341 Metabolic encephalopathy: Secondary | ICD-10-CM | POA: Diagnosis not present

## 2020-09-16 DIAGNOSIS — Z66 Do not resuscitate: Secondary | ICD-10-CM

## 2020-09-16 DIAGNOSIS — J9600 Acute respiratory failure, unspecified whether with hypoxia or hypercapnia: Secondary | ICD-10-CM | POA: Diagnosis not present

## 2020-09-16 DIAGNOSIS — C259 Malignant neoplasm of pancreas, unspecified: Secondary | ICD-10-CM | POA: Diagnosis not present

## 2020-09-16 LAB — CBC
HCT: 35.1 % — ABNORMAL LOW (ref 39.0–52.0)
Hemoglobin: 11.4 g/dL — ABNORMAL LOW (ref 13.0–17.0)
MCH: 30.5 pg (ref 26.0–34.0)
MCHC: 32.5 g/dL (ref 30.0–36.0)
MCV: 93.9 fL (ref 80.0–100.0)
Platelets: 175 10*3/uL (ref 150–400)
RBC: 3.74 MIL/uL — ABNORMAL LOW (ref 4.22–5.81)
RDW: 18.6 % — ABNORMAL HIGH (ref 11.5–15.5)
WBC: 8.1 10*3/uL (ref 4.0–10.5)
nRBC: 0 % (ref 0.0–0.2)

## 2020-09-16 LAB — BASIC METABOLIC PANEL
Anion gap: 8 (ref 5–15)
BUN: 16 mg/dL (ref 8–23)
CO2: 19 mmol/L — ABNORMAL LOW (ref 22–32)
Calcium: 8.1 mg/dL — ABNORMAL LOW (ref 8.9–10.3)
Chloride: 107 mmol/L (ref 98–111)
Creatinine, Ser: 0.54 mg/dL — ABNORMAL LOW (ref 0.61–1.24)
GFR, Estimated: >60 mL/min (ref >60)
Glucose, Bld: 301 mg/dL — ABNORMAL HIGH (ref 70–99)
Potassium: 3.9 mmol/L (ref 3.5–5.1)
Sodium: 134 mmol/L — ABNORMAL LOW (ref 135–145)

## 2020-09-16 LAB — GLUCOSE, CAPILLARY
Glucose-Capillary: 137 mg/dL — ABNORMAL HIGH (ref 70–99)
Glucose-Capillary: 178 mg/dL — ABNORMAL HIGH (ref 70–99)
Glucose-Capillary: 222 mg/dL — ABNORMAL HIGH (ref 70–99)
Glucose-Capillary: 238 mg/dL — ABNORMAL HIGH (ref 70–99)
Glucose-Capillary: 245 mg/dL — ABNORMAL HIGH (ref 70–99)
Glucose-Capillary: 259 mg/dL — ABNORMAL HIGH (ref 70–99)

## 2020-09-16 LAB — PHOSPHORUS: Phosphorus: 2.8 mg/dL (ref 2.5–4.6)

## 2020-09-16 LAB — MAGNESIUM: Magnesium: 1.8 mg/dL (ref 1.7–2.4)

## 2020-09-16 MED ORDER — LORAZEPAM 2 MG/ML IJ SOLN: 1.0000 mg | INTRAMUSCULAR | Status: DC | PRN

## 2020-09-16 MED ORDER — SODIUM CHLORIDE 0.9 % IV SOLN
2.0000 g | Freq: Two times a day (BID) | INTRAVENOUS | Status: DC
  Administered 2020-09-16: 20:00:00 2 g via INTRAVENOUS
  Filled 2020-09-16: qty 2

## 2020-09-16 MED ORDER — MORPHINE 100MG IN NS 100ML (1MG/ML) PREMIX INFUSION
1.0000 mg/h | INTRAVENOUS | Status: AC
  Administered 2020-09-17 – 2020-09-20 (×2): 1 mg/h via INTRAVENOUS
  Filled 2020-09-16 (×2): qty 100

## 2020-09-16 MED ORDER — GLYCOPYRROLATE 0.2 MG/ML IJ SOLN
0.2000 mg | INTRAMUSCULAR | Status: DC | PRN
  Administered 2020-09-21 – 2020-09-25 (×2): 0.2 mg via INTRAVENOUS
  Filled 2020-09-16: qty 1

## 2020-09-16 MED ORDER — INSULIN ASPART 100 UNIT/ML ~~LOC~~ SOLN
4.0000 [IU] | SUBCUTANEOUS | Status: DC
  Administered 2020-09-16 – 2020-09-17 (×4): 4 [IU] via SUBCUTANEOUS

## 2020-09-16 MED ORDER — MORPHINE BOLUS VIA INFUSION
1.0000 mg | INTRAVENOUS | Status: DC | PRN
  Administered 2020-09-25 (×2): 2 mg via INTRAVENOUS
  Filled 2020-09-16: qty 2

## 2020-09-16 MED ORDER — GLYCOPYRROLATE 0.2 MG/ML IJ SOLN
0.2000 mg | INTRAMUSCULAR | Status: DC
  Administered 2020-09-17 – 2020-09-27 (×47): 0.2 mg via INTRAVENOUS
  Filled 2020-09-16 (×48): qty 1

## 2020-09-16 NOTE — Progress Notes (Signed)
Inpatient Diabetes Program Recommendations  AACE/ADA: New Consensus Statement on Inpatient Glycemic Control   Target Ranges:  Prepandial:   less than 140 mg/dL      Peak postprandial:   less than 180 mg/dL (1-2 hours)      Critically ill patients:  140 - 180 mg/dL   Results for [name], [name] (MRN [id number]) as of 09/16/2020 08:24  Ref. Range 09/15/2020 07:27 09/15/2020 11:29 09/15/2020 16:29 09/15/2020 20:43 09/15/2020 23:58 09/16/2020 04:24 09/16/2020 07:39  Glucose-Capillary Latest Ref Range: 70 - 99 mg/dL 213 (H) 217 (H) 189 (H) 227 (H) 238 (H) 245 (H) 259 (H)   Review of Glycemic Control  Diabetes history: DM2 Outpatient Diabetes medications: Tresiba 25 mg daily, Metformin 1000 mg BID Current orders for Inpatient glycemic control: Novolog 0-15 units Q4H; Osmolite @ 50 ml/hr  Inpatient Diabetes Program Recommendations:    Insulin: Please consider ordering Novolog 4 units Q4H for tube feeding coverage. If tube feeding is stopped or held then Novolog tube feeding coverage should also be stopped or held.  Thanks, Barnie Alderman, RN, MSN, CDE Diabetes Coordinator Inpatient Diabetes Program [phone] (Team Pager from 8am to 5pm)

## 2020-09-16 NOTE — Progress Notes (Signed)
Pharmacy Antibiotic Note  Today is day #5   of cefepime and.doxycycline therapy  for this 69 yo male admitted with multi-focal aspiration pneumonia. Blood and urine cultures have shown no growth to date.  WBC count is WNL and patient is currently afebrile.  Renal function is likely over-estimated. Plan is to continue antibiotic therapy until plans are made for terminal extubation.   Intake/Output Summary (Last 24 hours) at 09/16/2020 0951 Last data filed at 09/16/2020 0940 Gross per 24 hour  Intake 925.12 ml  Output 1301 ml  Net -375.88 ml     Plan: Change cefepime to  2g IV q12h for CrClest~33mL/min Doxycycline 100mg  IV q12h  Pharmacy will continue to monitor renal function,    cultures and patient progress.   Height: 5\' 8"  (172.7 cm) Weight: 64.9 kg (143 lb 1.3 oz) IBW/kg (Calculated) : 68.4  Temp (24hrs), Avg:97.1 F (36.2 C), Min:96.4 F (35.8 C), Max:97.5 F (36.4 C)  Recent Labs  Lab 09/11/20 0614 09/30/20 1528 30-Sep-2020 1721 09/13/20 0051 09/13/20 0237 09/13/20 0238 09/15/20 0429 09/16/20 0448  WBC 5.6 4.9  --  5.8  --  5.3  --  8.1  CREATININE 0.52* 0.41*  --  0.40*  0.44*  --  0.42* 0.49* 0.54*  LATICACIDVEN  --  1.9 2.5* 2.2* 1.3  --   --   --     Estimated Creatinine Clearance: 81.1 mL/min (A) (by C-G formula based on SCr of 0.54 mg/dL (L)).      Antimicrobials this admission: vancomycin 12/19 >> 12/20 cefepime 12/19 >>  doxycycline 12/19 >>  Dose adjustments this admission: Cefepime , vancomycin  Microbiology results: 12/20 BCx: ng x3 days 12/19 UCx: no growth 12/19 MRSA PCR: neg 12/19 BC x2: NG x4 days  Thank you for allowing pharmacy to be a part of this patient's care.  Despina Pole 09/16/2020 9:43 AM

## 2020-09-16 NOTE — Progress Notes (Signed)
PROGRESS NOTE    John Strader Sr.  DPO:[id number] DOB: 1950-11-16 DOA: 08/26/2020 PCP: Perlie Mayo, NP   Brief Narrative:  As per H&P written by Dr. 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 Theodoro Kalata presents to the emergency department via EMS due to being unresponsive at home. Patient was recently admitted on 12/15 and discharged on 12/18 due to DKA. Patient was unable to provide history due to being intubated and sedated, history was obtained fromdaughter at bedside,ED physician and ED medical record. Per report, patient went to use the restroom around4 AM this morning,around 10 AM, he told the son that he was going back to bed. Daughter at bedside called from work this afternoon to check on patient, and patient's wife tried to wake him up at2 PM today but hewaslethargic andunresponsive, EMS was activated, on arrival of EMS team, blood glucose was noted to be in the 30s, 1 amp of D50 was given in the field, but blood glucose was still 31 when patient arrived to the ED. Per daughter, a usedinsulin syringewas noted in the trash and patient was reported to have had only one sandwich last night.  ED Course: In the emergency department, he was febrile, tachycardic, and intermittently tachypneic. Several rounds of IV D50 were given due to hypoglycemia and patient was started on IV D10W. Teleneurology was consulted by ED physician, and CT of head without contrast done showed no acute intracranial process. CT angiography of head and neck showed no acute large or medium vessel occlusion. Patient was intubated due to inability to protect airway. Chest x-ray done showed hazy bilateral airspace opacities  which may represent multifocal pneumonia in the appropriate clinical setting. Patient was empirically started on IV antibiotics (vancomycin, doxycycline and cefepime). PCCM was consulted, no beds in [facility] and [facility],it was [facility] for [facility] labs and ABG by [place] with plan to be seen here in the morning by PCCM. Hospitalist was asked to admit patient for further evaluation management.   Assessment & Plan:   Active Problems:   Essential hypertension   Pancreatic cancer metastasized to liver (HCC)   Port-A-Cath in place   Acute metabolic encephalopathy   Hypoglycemia   Multifocal pneumonia   Hypoalbuminemia   Lactic acidosis   Elevated liver enzymes   Prolonged QT interval   Sepsis ([place])   Palliative care by specialist   1-acute metabolic encephalopathy in the setting of hypoglycemia-persistent -CT head negative for acute stroke -Patient blood sugar remains to be low despite the use of D50 -Currently receiving D10 -Continue holding hypoglycemic agents -Patient received a stress dose Solu-Cortef -Continue to follow clinical response. -He has been intubated for airway protection-continue off sedation now 48 hours; plan to monitor 72 to 96 hours before full prognositication -EEG report with significant encephalopathic state reviewed with family on 12/22 -Extensive discussion with family regarding very poor prognosis and need for terminal extubation soon.  Additionally, not a candidate for tracheostomy.  Reviewed with PCCM. -Family now in agreement for terminal extubation on 12/24 and he is DNR.  2-multifocal aspiration pneumonia -With high concern for aspiration -Currently intubated -Continue cefepimeand doxycycline for 5 days and await respiratory culture, repeat chest x-ray stable -Follow pulmonology recommendations for extubation purposes  3-severe sepsis: POA -organ dysfunction (his lungs, from PNA; ended requiring  intubation). -Patient with need for intubation, lactic acidosis, tachycardic, febrile and tachypneic meeting criteria for sepsis. -Continue antibiotics as mentioned above and supportive care -Vancomycin has been discontinued in the setting of negative MRSA PCR -Continue to maintain adequate hydration.  4-prolonged QT -Continue to follow extra lites and replete as needed -Avoid medications that can further prolong QT  5-metastatic pancreatic cancer -Currently receiving palliative chemotherapy as an outpatient -Plan for terminal extubation 12/24   DVT prophylaxis:Lovenox Code Status:DNR Family Communication:daughter at bedside 12/22 Disposition:  Status is: Inpatient  Dispo: The patient is from: home Anticipated d/c is to: Anticipated hospital death Anticipated d/c date is: Anticipate in-hospital death Patient currently no medically ready for discharge; patient still intubated, with soft blood pressure and is still requiring D10. Overall prognosis is guarded/poor.  Plan is for terminal extubation 12/24.   Consultants:  PCCM  Palliative care   Procedures: See below for x-ray report.   Antimicrobials:  Cefepime Doxycycline   Subjective: Patient seen and evaluated today and is noted to be unresponsive on the ventilator.  No acute overnight events noted. Objective: Vitals:   09/16/20 0830 09/16/20 0900 09/16/20 1000 09/16/20 1100  BP:  (!) 146/97 (!) 135/96 (!) 149/98  Pulse:  99 99 (!) 105  Resp:  $Remo'16 17 18  'wPAHc$ Temp:      TempSrc:      SpO2: 100% 100% 100% 100%  Weight:      Height:        Intake/Output Summary (Last 24 hours) at 09/16/2020 1110 Last data filed at 09/16/2020 1107 Gross per 24 hour  Intake 857.3 ml  Output 1301 ml  Net -443.7 ml   Filed Weights   09/14/20 0512 09/15/20 0452 09/16/20 0500  Weight: 65.8 kg 66.1 kg 64.9 kg    Examination:  General exam: Appears  unresponsive Respiratory system: Clear to auscultation. Respiratory effort normal.  Intubated on FiO2 40%. Cardiovascular system: S1 & S2 heard, RRR.  Gastrointestinal system: Abdomen is soft Central nervous system: Unresponsive Extremities: No edema, left BKA Skin: No significant lesions noted Psychiatry: Cannot be assessed    Data Reviewed: I have personally reviewed following labs and imaging studies  CBC: Recent Labs  Lab 09/10/20 0616 09/11/20 0614 09/19/2020 1528 09/13/20 0238 09/16/20 0448  WBC 5.2 5.6 4.9 5.3 8.1  NEUTROABS 3.4 3.6 3.9  --   --   HGB 9.8* 10.7* 11.1* 10.0* 11.4*  HCT 31.3* 33.6* 34.4* 30.5* 35.1*  MCV 94.8 92.8 93.5 92.4 93.9  PLT 136* 143* 185 135* 505   Basic Metabolic Panel: Recent Labs  Lab 09/11/20 0614 09/08/2020 1528 09/13/20 0051 09/13/20 0238 09/15/20 0429 09/16/20 0448  NA 135 131* 132* 133*  --  134*  K 3.4* 3.6 2.9* 2.8*  --  3.9  CL 100 100 103 104  --  107  CO2 $Re'26 25 22 23  'qqN$ --  19*  GLUCOSE 51* 189* 70 58*  --  301*  BUN 8 8 7* 6*  --  16  CREATININE 0.52* 0.41* 0.40* 0.42* 0.49* 0.54*  CALCIUM 8.2* 8.1* 7.6* 7.7*  --  8.1*  MG 1.6*  --   --  1.6*  --  1.8  PHOS 2.9  --   --  3.0  --  2.8   GFR: Estimated Creatinine Clearance: 81.1 mL/min (A) (by C-G formula based on SCr of 0.54 mg/dL (L)). Liver Function Tests: Recent Labs  Lab 09/10/20 0616 09/11/20 0614 09/07/2020 1528 09/13/20 0238  AST 70* 66* 71*  73*  ALT 38 39 44 38  ALKPHOS 231* 288* 276* 232*  BILITOT 0.6 0.3 0.6 0.5  PROT 5.5* 5.8* 6.2* 5.5*  ALBUMIN 2.2* 2.3* 2.6* 2.1*   No results for input(s): LIPASE, AMYLASE in the last 168 hours. No results for input(s): AMMONIA in the last 168 hours. Coagulation Profile: Recent Labs  Lab 08/28/2020 1528 09/13/20 0238  INR 0.9 1.1   Cardiac Enzymes: No results for input(s): CKTOTAL, CKMB, CKMBINDEX, TROPONINI in the last 168 hours. BNP (last 3 results) No results for input(s): PROBNP in the last 8760  hours. HbA1C: No results for input(s): HGBA1C in the last 72 hours. CBG: Recent Labs  Lab 09/15/20 1629 09/15/20 2043 09/15/20 2358 09/16/20 0424 09/16/20 0739  GLUCAP 189* 227* 238* 245* 259*   Lipid Profile: No results for input(s): CHOL, HDL, LDLCALC, TRIG, CHOLHDL, LDLDIRECT in the last 72 hours. Thyroid Function Tests: No results for input(s): TSH, T4TOTAL, FREET4, T3FREE, THYROIDAB in the last 72 hours. Anemia Panel: No results for input(s): VITAMINB12, FOLATE, FERRITIN, TIBC, IRON, RETICCTPCT in the last 72 hours. Sepsis Labs: Recent Labs  Lab 09/05/2020 1528 09/08/2020 1721 09/13/20 0051 09/13/20 0237  PROCALCITON  --   --  0.28  --   LATICACIDVEN 1.9 2.5* 2.2* 1.3    Recent Results (from the past 240 hour(s))  Resp Panel by RT-PCR (Flu A&B, Covid) Nasopharyngeal Swab     Status: None   Collection Time: 09/08/20  7:24 PM   Specimen: Nasopharyngeal Swab; Nasopharyngeal(NP) swabs in vial transport medium  Result Value Ref Range Status   SARS Coronavirus 2 by RT PCR NEGATIVE NEGATIVE Final    Comment: (NOTE) SARS-CoV-2 target nucleic acids are NOT DETECTED.  The SARS-CoV-2 RNA is generally detectable in upper respiratory specimens during the acute phase of infection. The lowest concentration of SARS-CoV-2 viral copies this assay can detect is 138 copies/mL. A negative result does not preclude SARS-Cov-2 infection and should not be used as the sole basis for treatment or other patient management decisions. A negative result may occur with  improper specimen collection/handling, submission of specimen other than nasopharyngeal swab, presence of viral mutation(s) within the areas targeted by this assay, and inadequate number of viral copies(<138 copies/mL). A negative result must be combined with clinical observations, patient history, and epidemiological information. The expected result is Negative.  Fact Sheet for Patients:   EntrepreneurPulse.com.au  Fact Sheet for Healthcare Providers:  IncredibleEmployment.be  This test is no t yet approved or cleared by the [place] FDA and  has been authorized for detection and/or diagnosis of SARS-CoV-2 by FDA under an Emergency Use Authorization (EUA). This EUA will remain  in effect (meaning this test can be used) for the duration of the COVID-19 declaration under Section 564(b)(1) of the Act, 21 U.S.C.section 360bbb-3(b)(1), unless the authorization is terminated  or revoked sooner.       Influenza A by PCR NEGATIVE NEGATIVE Final   Influenza B by PCR NEGATIVE NEGATIVE Final    Comment: (NOTE) The Xpert Xpress SARS-CoV-2/FLU/RSV plus assay is intended as an aid in the diagnosis of influenza from Nasopharyngeal swab specimens and should not be used as a sole basis for treatment. Nasal washings and aspirates are unacceptable for Xpert Xpress SARS-CoV-2/FLU/RSV testing.  Fact Sheet for Patients: EntrepreneurPulse.com.au  Fact Sheet for Healthcare Providers: IncredibleEmployment.be  This test is not yet approved or cleared by the [place] FDA and has been authorized for detection and/or diagnosis of SARS-CoV-2 by FDA under an Emergency  Use Authorization (EUA). This EUA will remain in effect (meaning this test can be used) for the duration of the COVID-19 declaration under Section 564(b)(1) of the Act, 21 U.S.C. section 360bbb-3(b)(1), unless the authorization is terminated or revoked.  Performed at [facility], [address], [place], [place] [address]   Urine culture     Status: None   Collection Time: 09/13/2020  3:20 PM   Specimen: In/Out Cath Urine  Result Value Ref Range Status   Specimen Description   Final    IN/OUT CATH URINE Performed at [facility], [address], [place], [place] [address]    Special Requests   Final    NONE Performed at [facility],  [address] [place], [place] [address]    Culture   Final    NO GROWTH Performed at [facility], [place] [address] [place], [place] [address]    Report Status 09/13/2020 FINAL  Final  Resp Panel by RT-PCR (Flu A&B, Covid) Nasopharyngeal Swab     Status: None   Collection Time: 08/27/2020  3:27 PM   Specimen: Nasopharyngeal Swab; Nasopharyngeal(NP) swabs in vial transport medium  Result Value Ref Range Status   SARS Coronavirus 2 by RT PCR NEGATIVE NEGATIVE Final    Comment: (NOTE) SARS-CoV-2 target nucleic acids are NOT DETECTED.  The SARS-CoV-2 RNA is generally detectable in upper respiratory specimens during the acute phase of infection. The lowest concentration of SARS-CoV-2 viral copies this assay can detect is 138 copies/mL. A negative result does not preclude SARS-Cov-2 infection and should not be used as the sole basis for treatment or other patient management decisions. A negative result may occur with  improper specimen collection/handling, submission of specimen other than nasopharyngeal swab, presence of viral mutation(s) within the areas targeted by this assay, and inadequate number of viral copies(<138 copies/mL). A negative result must be combined with clinical observations, patient history, and epidemiological information. The expected result is Negative.  Fact Sheet for Patients:  EntrepreneurPulse.com.au  Fact Sheet for Healthcare Providers:  IncredibleEmployment.be  This test is no t yet approved or cleared by the [place] FDA and  has been authorized for detection and/or diagnosis of SARS-CoV-2 by FDA under an Emergency Use Authorization (EUA). This EUA will remain  in effect (meaning this test can be used) for the duration of the COVID-19 declaration under Section 564(b)(1) of the Act, 21 U.S.C.section 360bbb-3(b)(1), unless the authorization is terminated  or revoked sooner.       Influenza A by PCR NEGATIVE  NEGATIVE Final   Influenza B by PCR NEGATIVE NEGATIVE Final    Comment: (NOTE) The Xpert Xpress SARS-CoV-2/FLU/RSV plus assay is intended as an aid in the diagnosis of influenza from Nasopharyngeal swab specimens and should not be used as a sole basis for treatment. Nasal washings and aspirates are unacceptable for Xpert Xpress SARS-CoV-2/FLU/RSV testing.  Fact Sheet for Patients: EntrepreneurPulse.com.au  Fact Sheet for Healthcare Providers: IncredibleEmployment.be  This test is not yet approved or cleared by the [place] FDA and has been authorized for detection and/or diagnosis of SARS-CoV-2 by FDA under an Emergency Use Authorization (EUA). This EUA will remain in effect (meaning this test can be used) for the duration of the COVID-19 declaration under Section 564(b)(1) of the Act, 21 U.S.C. section 360bbb-3(b)(1), unless the authorization is terminated or revoked.  Performed at [facility], [address] [place], [place] [address]   Blood Culture (routine x 2)     Status: None (Preliminary  result)   Collection Time: 09/11/2020  3:29 PM   Specimen: BLOOD  Result Value Ref Range Status   Specimen Description BLOOD PORTA CATH  Final   Special Requests   Final    BOTTLES DRAWN AEROBIC AND ANAEROBIC Blood Culture adequate volume   Culture   Final    NO GROWTH 4 DAYS Performed at [facility], [address], [place], [place] [address]    Report Status PENDING  Incomplete  Blood Culture (routine x 2)     Status: None (Preliminary result)   Collection Time: 09/11/2020  3:38 PM   Specimen: BLOOD  Result Value Ref Range Status   Specimen Description BLOOD BLOOD RIGHT WRIST  Final   Special Requests   Final    BOTTLES DRAWN AEROBIC AND ANAEROBIC Blood Culture results may not be optimal due to an inadequate volume of blood received in culture bottles   Culture   Final    NO GROWTH 4 DAYS Performed at [facility], [address]  [place], [place] [address]    Report Status PENDING  Incomplete  MRSA PCR Screening     Status: None   Collection Time: 09/09/2020  3:59 PM   Specimen: Nasopharyngeal  Result Value Ref Range Status   MRSA by PCR NEGATIVE NEGATIVE Final    Comment:        The GeneXpert MRSA Assay (FDA approved for NASAL specimens only), is one component of a comprehensive MRSA colonization surveillance program. It is not intended to diagnose MRSA infection nor to guide or monitor treatment for MRSA infections. Performed at [facility], [address] [place], [place] [address]   Culture, blood (routine x 2)     Status: None (Preliminary result)   Collection Time: 09/13/20 12:51 AM   Specimen: BLOOD  Result Value Ref Range Status   Specimen Description BLOOD RIGHT ANTECUBITAL  Final   Special Requests   Final    BOTTLES DRAWN AEROBIC AND ANAEROBIC Blood Culture adequate volume   Culture   Final    NO GROWTH 3 DAYS Performed at [facility], [address], [place], [place] [address]    Report Status PENDING  Incomplete  Culture, blood (routine x 2)     Status: None (Preliminary result)   Collection Time: 09/13/20 12:51 AM   Specimen: BLOOD LEFT HAND  Result Value Ref Range Status   Specimen Description BLOOD LEFT HAND  Final   Special Requests   Final    BOTTLES DRAWN AEROBIC AND ANAEROBIC Blood Culture adequate volume   Culture   Final    NO GROWTH 3 DAYS Performed at [facility], [address] [place],  [address]    Report Status PENDING  Incomplete         Radiology Studies: DG Chest Port 1 View  Result Date: 09/16/2020 CLINICAL DATA:  Respiratory failure.  Pneumonia. EXAM: PORTABLE CHEST 1 VIEW COMPARISON:  Chest radiograph December 19, 21. CTA neck December 19, 21. FINDINGS: Endotracheal tube tip is approximately 4.8 cm above the carina. Similar position of a left Port-A-Cath with the tip projecting in the region of the superior cavoatrial junction. Improved lung opacities.  Hazy left lung base opacity. No visible pleural effusions or pneumothorax. IMPRESSION: 1. Appropriate positioning of lines/tubes, as detailed above. 2. Improved lung opacities, compatible with improving pneumonia. 3. Subtle hazy opacity at the left lung base may represent layering small pleural effusion and/or atelectasis on this semi upright radiograph. Electronically Signed   By: Roslynn Amble  Ronnald Ramp MD   On: 09/16/2020 08:00   EEG adult  Result Date: 09/15/2020 Lora Havens, MD     09/15/2020  9:52 AM Patient Name: John Islam Sr. MRN: [id number] Epilepsy Attending: Lora Havens Referring Physician/Provider: Dr Kara Mead Date: 09/14/2020 Duration: 23.35 mins Patient history: 69 year old male with altered mental status.  EEG to evaluate for seizures. Level of alertness: comatose AEDs during EEG study: None Technical aspects: This EEG study was done with scalp electrodes positioned according to the 10-20 International system of electrode placement. Electrical activity was acquired at a sampling rate of $Remov'500Hz'erqVAK$  and reviewed with a high frequency filter of $RemoveB'70Hz'vRclnueG$  and a low frequency filter of $RemoveB'1Hz'oDHrYuWg$ . EEG data were recorded continuously and digitally stored. Description: EEG showed continuous generalized 2-3 Hz delta slowing.  Periodic discharges with triphasic morphology at 1 Hz were also noted.  Hyperventilation and photic stimulation were not performed.   ABNORMALITY -Continuous slow, generalized -Periodic discharges with triphasic morphology, generalized IMPRESSION: This study is suggestive of severe diffuse encephalopathy, nonspecific etiology but could be secondary to toxic-metabolic causes. Of note, at times periodic discharges with triphasic morphology can be on the ictal-interictal continuum but given the morphology and the frequency of these discharges it is less likely.  No seizures or definite epileptiform discharges were seen throughout the recording. Priyanka Barbra Sarks        Scheduled  Meds: . chlorhexidine gluconate (MEDLINE KIT)  15 mL Mouth Rinse BID  . Chlorhexidine Gluconate Cloth  6 each Topical Daily  . feeding supplement (PROSource TF)  90 mL Per Tube TID  . insulin aspart  0-15 Units Subcutaneous Q4H  . insulin aspart  4 Units Subcutaneous Q4H  . mouth rinse  15 mL Mouth Rinse 10 times per day  . pantoprazole sodium  40 mg Per Tube Q1200  . sodium chloride flush  3 mL Intravenous Q12H   Continuous Infusions: . sodium chloride    . ceFEPime (MAXIPIME) IV    . doxycycline (VIBRAMYCIN) IV Stopped (09/16/20 1053)  . feeding supplement (OSMOLITE 1.2 CAL) 1,000 mL (09/16/20 0854)     LOS: 4 days    Time spent: 35 minutes    Camp Gopal D Manuella Ghazi, DO [facility]  If 7PM-7AM, please contact night-coverage [email or web address] 09/16/2020, 11:10 AM

## 2020-09-16 NOTE — Progress Notes (Signed)
Daily Progress Note   Patient Name: John Maher Sr.       Date: 09/16/2020 DOB: [birth year]  Age: 69 y.o. MRN#: [id number] Attending Physician: Rodena Goldmann, DO Primary Care Physician: Perlie Mayo, NP Admit Date: 09/15/2020  Reason for Consultation/Follow-up: Establishing goals of care  Subjective: Patient remains poorly responsive off of sedation. Posturing. Remains intubated.  GOC:  F/u GOC with patient's two daughters at bedside [name] and [name]) and wife [name]) on speaker phone.   Discussed in detail course of hospitalization including diagnoses, interventions, plan of care, and unfortunate poor prognosis due to neurological status, inability to protect airway, and terminal cancer.  [name] and [name] are in a different place today. They are appropriately distraught by the thought of losing [name] but share their understanding that he will not survive long once ventilator is removed. They do not wish to see him struggle and acknowledge that we are prolonging his suffering the longer we keep him on the ventilator. [name] states "he's not here anymore." [name] states "I don't want to see him suffer" and "let him go."   [name] shares that her father mentioned to a grandson that he would not survive past christmas. [name] shares her belief that her father understood how severe his cancer/prognosis was but did not share details with the family.   Discussed recommendation for compassionate extubation tomorrow morning when family is ready. Likely around 10am. [name] and [name] will be here. [name] has panic attacks and will be difficult for her to be here when extubated. Discussed plan to allow family to visit this afternoon.   Explained the process of compassionate extubation to comfort measures,  ensuring symptom management so he does not struggle or suffer. Prepared family for anything to happen at any time once extubated, minutes-hours-days. Reiterated focus on comfort as he nears EOL.   Discussed medical recommendation for code status change to DNR in order to allow him to pass naturally and peacefully when his time comes. [name], and [name] agree with code status change to DNR. Again, they do not wish to see him suffer.   Answered many questions regarding care plan. Therapeutic listening. Emotional/spiritual support provided.   PMT contact information given.   Discussed in detail with family, Dr. Manuella Ghazi, [place].  Length of Stay: 4  Current Medications: Scheduled Meds:  .  chlorhexidine gluconate (MEDLINE KIT)  15 mL Mouth Rinse BID  . Chlorhexidine Gluconate Cloth  6 each Topical Daily  . feeding supplement (PROSource TF)  90 mL Per Tube TID  . insulin aspart  0-15 Units Subcutaneous Q4H  . insulin aspart  4 Units Subcutaneous Q4H  . mouth rinse  15 mL Mouth Rinse 10 times per day  . pantoprazole sodium  40 mg Per Tube Q1200  . sodium chloride flush  3 mL Intravenous Q12H    Continuous Infusions: . sodium chloride    . ceFEPime (MAXIPIME) IV    . doxycycline (VIBRAMYCIN) IV Stopped (09/16/20 1053)  . feeding supplement (OSMOLITE 1.2 CAL) 1,000 mL (09/16/20 0854)    PRN Meds: sodium chloride, fentaNYL (SUBLIMAZE) injection, sodium chloride flush  Physical Exam Vitals and nursing note reviewed.  Constitutional:      Interventions: He is intubated.  Cardiovascular:     Rate and Rhythm: Normal rate.  Pulmonary:     Effort: No tachypnea, accessory muscle usage or respiratory distress. He is intubated.     Comments: Intubated/mechanical ventilation Skin:    General: Skin is warm and dry.  Neurological:     Comments: Comatose. Posturing. Weak cough. Absent gag. + pupillary response             Vital Signs: BP (!) 149/98   Pulse (!) 105   Temp (!) 96.7 F (35.9  C) (Axillary)   Resp 18   Ht $R'5\' 8"'KU$  (1.727 m)   Wt 64.9 kg   SpO2 100%   BMI 21.76 kg/m  SpO2: SpO2: 100 % O2 Device: O2 Device: Ventilator O2 Flow Rate: O2 Flow Rate (L/min): 0 L/min  Intake/output summary:   Intake/Output Summary (Last 24 hours) at 09/16/2020 1115 Last data filed at 09/16/2020 1107 Gross per 24 hour  Intake 857.3 ml  Output 1301 ml  Net -443.7 ml   LBM: Last BM Date: 09/16/20 Baseline Weight: Weight: 62 kg Most recent weight: Weight: 64.9 kg       Palliative Assessment/Data: PPS 30%    Flowsheet Rows   Flowsheet Row Most Recent Value  Intake Tab   Referral Department Hospitalist  Unit at Time of Referral ICU  Palliative Care Primary Diagnosis Cancer  Palliative Care Type New Palliative care  Reason for referral Clarify Goals of Care  Date first seen by Palliative Care 09/14/20  Clinical Assessment   Palliative Performance Scale Score 30%  Psychosocial & Spiritual Assessment   Palliative Care Outcomes   Patient/Family meeting held? Yes  Who was at the meeting? wife and daughter  Palliative Care Outcomes Clarified goals of care, Provided end of life care assistance, Provided psychosocial or spiritual support, ACP counseling assistance      Patient Active Problem List   Diagnosis Date Noted  . Palliative care by specialist   . Acute metabolic encephalopathy 07/11/5101  . Hypoglycemia 09/16/2020  . Multifocal pneumonia 09/13/2020  . Hypoalbuminemia 09/08/2020  . Lactic acidosis 09/06/2020  . Elevated liver enzymes 09/05/2020  . Prolonged QT interval 09/04/2020  . Sepsis ([place]) 09/20/2020  . Mouth sore secondary to chemotherapy 07/28/2020  . 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 Genetic testing 06/30/2020  . Dehydration 06/09/2020  . Port-A-Cath in place 05/25/2020  . Pancreatic cancer metastasized to liver ([place])   . Goals of care, counseling/discussion 05/11/2020   . Family history of cervical cancer   . Pain of upper abdomen   .  Hyperglycemia   . Pancreatic mass 04/24/2020  . Renal mass 04/24/2020  . Type 2 diabetes mellitus with peripheral neuropathy ([facility])   . Essential hypertension   . Left below-knee amputee ([place]) 08/05/2018  . Dyslipidemia   . Current smoker   . PAD (peripheral artery disease) ([place]) 07/20/2018    Palliative Care Assessment & Plan   Patient Profile: 69 y.o. male  with past medical history of pancreatic cancer with mets to liver s/p 5 cycles of chemotherapy, critical leg ischemia s/p left BKA 2019, DM type 2, falls admitted on 09/02/2020 with unresponsiveness from home. Recent hospital admission for 12/15-12/18 due to DKA. EMS found CBG to be in the 30's. Intubated for airway protection. ICU admission for acute metabolic encephalopathy in the setting of hypoglycemia, severe sepsis, multifocal pneumonia receiving IV antibiotics. CT head negative for acute findings. Followed by oncology Dr. Burr Medico s/p 5 cycles of palliative chemotherapy. Palliative medicine consultation for goals of care.   Assessment: Acute metabolic encephalopathy Hypoglycemia Multifocal pneumonia Severe sepsis Metastatic pancreatic cancer Prolonged QT  Recommendations/Plan: F/u GOC with patient's wife [name]) and two daughters [name] and [name]). Family has a good understanding of severity of his condition and poor prognosis. They do not wish to prolong his suffering. Plan for compassionate extubation 12/24 around 10am when family is ready.  Allow family to visit this afternoon.  Wife and daughters understand and agree with code status change to DNR. Stop tube feeds at midnight, in preparation for compassionate extubation. Symptom management Start Morphine continuous infusion 1mg /hr on 12/24 at 0600. RN may bolus morphine via infusion 1-2mg  IV q56min prn pain/dyspnea/air hunger/tachypnea Robinul 0.2mg  IV q4h scheduled for secretions. First dose 0600. PRN  Robinul for copious secretions Ativan 1mg  IV q4h prn anxiety/seizures/agitation Continue frequent oral care and repositioning. Spiritual care consult for prayer and support. Anticipate hospital death once compassionately extubated. I have prepared family for anything to happen at anytime once extubated.    Code Status: DNR   Code Status Orders  (From admission, onward)         Start     Ordered   09/04/2020 2252  Full code  Continuous        09/19/2020 2251        Code Status History    Date Active Date Inactive Code Status Order ID Comments User Context   09/08/2020 2120 09/11/2020 1849 Full Code [id number]  Bethena Roys, MD ED   04/24/2020 1827 04/26/2020 1807 Full Code [id number]  Vashti Hey, MD ED   08/05/2018 1856 08/10/2018 1414 Full Code [id number]  Cathlyn Parsons, PA-C Inpatient   08/05/2018 1856 08/05/2018 1856 Full Code [id number]  Cathlyn Parsons, PA-C Inpatient   07/20/2018 0430 08/05/2018 1850 Full Code [id number]  Jani Gravel, MD ED   Advance Care Planning Activity      Prognosis: Likely minutes-hours once compassionately extubated to comfort. Prepared family for this.   Discharge Planning: Anticipated Hospital Death: once compassionately extubated to comfort measures.   Care plan was discussed with Dr. Manuella Ghazi, RN, daughters [name], [name]), wife [name])  Thank you for allowing the Palliative Medicine Team to assist in the care of this patient.   Total Time 75 Prolonged Time Billed yes    Greater than 50% of this time was spent counseling and coordinating care related to the above assessment and plan.   Ihor Dow, DNP, FNP-C Palliative Medicine Team  Phone: [phone] Fax: [phone]  Please contact Palliative Medicine Team phone at [phone] for  questions and concerns.

## 2020-09-17 DIAGNOSIS — G9341 Metabolic encephalopathy: Secondary | ICD-10-CM | POA: Diagnosis not present

## 2020-09-17 LAB — CULTURE, BLOOD (ROUTINE X 2)
Culture: NO GROWTH
Culture: NO GROWTH
Special Requests: ADEQUATE

## 2020-09-17 LAB — GLUCOSE, CAPILLARY
Glucose-Capillary: 126 mg/dL — ABNORMAL HIGH (ref 70–99)
Glucose-Capillary: 154 mg/dL — ABNORMAL HIGH (ref 70–99)
Glucose-Capillary: 66 mg/dL — ABNORMAL LOW (ref 70–99)
Glucose-Capillary: 73 mg/dL (ref 70–99)

## 2020-09-17 MED ORDER — SODIUM CHLORIDE 0.9 % IV SOLN
2.0000 g | Freq: Three times a day (TID) | INTRAVENOUS | Status: DC
  Filled 2020-09-17: qty 2

## 2020-09-17 MED ORDER — DEXTROSE 50 % IV SOLN
25.0000 g | Freq: Once | INTRAVENOUS | Status: AC
  Administered 2020-09-17: 09:00:00 25 g via INTRAVENOUS
  Filled 2020-09-17: qty 50

## 2020-09-17 NOTE — Progress Notes (Signed)
PROGRESS NOTE    John [place] Sr.  [id number] DOB: Nov 05, 1950 DOA: 09/02/2020 PCP: Perlie Mayo, NP   Brief Narrative:  As per H&P written by Dr. 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 Theodoro Kalata presents to the emergency department via EMS due to being unresponsive at home. Patient was recently admitted on 12/15 and discharged on 12/18 due to DKA. Patient was unable to provide history due to being intubated and sedated, history was obtained fromdaughter at bedside,ED physician and ED medical record. Per report, patient went to use the restroom around4 AM this morning,around 10 AM, he told the son that he was going back to bed. Daughter at bedside called from work this afternoon to check on patient, and patient's wife tried to wake him up at2 PM today but hewaslethargic andunresponsive, EMS was activated, on arrival of EMS team, blood glucose was noted to be in the 30s, 1 amp of D50 was given in the field, but blood glucose was still 31 when patient arrived to the ED. Per daughter, a usedinsulin syringewas noted in the trash and patient was reported to have had only one sandwich last night.  ED Course: In the emergency department, he was febrile, tachycardic, and intermittently tachypneic. Several rounds of IV D50 were given due to hypoglycemia and patient was started on IV D10W. Teleneurology was consulted by ED physician, and CT of head without contrast done showed no acute intracranial process. CT angiography of head and neck showed no acute large or medium vessel occlusion. Patient was intubated due to inability to protect airway. Chest x-ray done showed hazy bilateral airspace opacities  which may represent multifocal pneumonia in the appropriate clinical setting. Patient was empirically started on IV antibiotics (vancomycin, doxycycline and cefepime). PCCM was consulted, no beds in [facility] and [facility],it was recommended for patient [facility] of labs and ABG by [place] with plan to be seen here in the morning by PCCM. Hospitalist was asked to admit patient for further evaluation management.   Assessment & Plan:   Active Problems:   Essential hypertension   Pancreatic cancer metastasized to liver (HCC)   Port-A-Cath in place   Acute metabolic encephalopathy   Hypoglycemia   Multifocal pneumonia   Hypoalbuminemia   Lactic acidosis   Elevated liver enzymes   Prolonged QT interval   Sepsis ([place])   Palliative care by specialist   1-acute metabolic encephalopathy in the setting of hypoglycemia-persistent   2-multifocalaspirationpneumonia -Hold further antibiotic treatments -That is post terminal extubation on 12/24  3-severe sepsis: POA   4-prolonged QT  5-metastatic pancreatic cancer -Currently on comfort care status post terminal extubation -Continue morphine infusion for comfort and anticipate hospital death   -Transfer to [place] floor and continue on comfort measures with anticipated hospital death.  DVT prophylaxis:Lovenox Code Status:DNR Family Communication:daughter at bedside 12/22 Disposition:  Status is: Inpatient  Dispo: The patient is from: home Anticipated d/c is to: Anticipated hospital death Anticipated d/c date is: Anticipate in-hospital death Patient currently no medically ready for discharge; patient still intubated, with soft blood pressure and is still requiring D10. Overall prognosis is guarded/poor.  Plan is for terminal extubation 12/24.   Consultants:  PCCM  Palliative care   Procedures: See below for x-ray report.   Antimicrobials:   Cefepime-DC 12/24 Doxycycline-DC 12/24  Subjective: Patient seen and evaluated today and is unresponsive on the ventilator.  No acute overnight events noted.  Objective: Vitals:   09/17/20 0900 09/17/20 1000 09/17/20 1100 09/17/20 1200  BP: 136/87 (!) 149/102 (!) 134/95 (!) 142/89  Pulse: 66 88 84 82  Resp: 15 15 12 11   Temp:      TempSrc:      SpO2: 100% 100% 100% 100%  Weight:      Height:        Intake/Output Summary (Last 24 hours) at 09/17/2020 1421 Last data filed at 09/17/2020 1221 Gross per 24 hour  Intake 606.06 ml  Output 625 ml  Net -18.94 ml   Filed Weights   09/15/20 0452 09/16/20 0500 09/17/20 0500  Weight: 66.1 kg 64.9 kg 64.9 kg    Examination:  General exam: Unresponsive Respiratory system: Clear to auscultation. Respiratory effort normal.  Intubated with FiO2 40%. Cardiovascular system: S1 & S2 heard, RRR.  Gastrointestinal system: Abdomen is soft Central nervous system: Unresponsive Extremities: No edema, left BKA Skin: Wounds noted on the lower extremities Psychiatry: Cannot be assessed    Data Reviewed: I have personally reviewed following labs and imaging studies  CBC: Recent Labs  Lab 09/11/20 0614 09/10/2020 1528 09/13/20 0238 09/16/20 0448  WBC 5.6 4.9 5.3 8.1  NEUTROABS 3.6 3.9  --   --   HGB 10.7* 11.1* 10.0* 11.4*  HCT 33.6* 34.4* 30.5* 35.1*  MCV 92.8 93.5 92.4 93.9  PLT 143* 185 135* 0000000   Basic Metabolic Panel: Recent Labs  Lab 09/11/20 0614 08/28/2020 1528 09/13/20 0051 09/13/20 0238 09/15/20 0429 09/16/20 0448  NA 135 131* 132* 133*  --  134*  K 3.4* 3.6 2.9* 2.8*  --  3.9  CL 100 100 103 104  --  107  CO2 26 25 22 23   --  19*  GLUCOSE 51* 189* 70 58*  --  301*  BUN 8 8 7* 6*  --  16  CREATININE 0.52* 0.41* 0.40* 0.42* 0.49* 0.54*  CALCIUM 8.2* 8.1* 7.6* 7.7*  --  8.1*  MG 1.6*  --   --  1.6*  --  1.8  PHOS 2.9  --   --  3.0  --  2.8   GFR: Estimated Creatinine Clearance: 81.1 mL/min (A) (by C-G formula  based on SCr of 0.54 mg/dL (L)). Liver Function Tests: Recent Labs  Lab 09/11/20 0614 09/10/2020 1528 09/13/20 0238  AST 66* 71* 73*  ALT 39 44 38  ALKPHOS 288* 276* 232*  BILITOT 0.3 0.6 0.5  PROT 5.8* 6.2* 5.5*  ALBUMIN 2.3* 2.6* 2.1*   No results for input(s): LIPASE, AMYLASE in the last 168 hours. No results for input(s): AMMONIA in the last 168 hours. Coagulation Profile: Recent Labs  Lab 08/30/2020 1528 09/13/20 0238  INR 0.9 1.1   Cardiac Enzymes: No results for input(s): CKTOTAL, CKMB, CKMBINDEX, TROPONINI in the last 168 hours. BNP (last 3 results) No results for input(s): PROBNP in the last 8760 hours. HbA1C: No results for input(s): HGBA1C in the last 72 hours. CBG: Recent Labs  Lab 09/16/20 2004 09/17/20 0020 09/17/20 0440 09/17/20 0826 09/17/20 0907  GLUCAP 178* 154* 73 66* 126*   Lipid Profile: No results for input(s): CHOL, HDL, LDLCALC, TRIG, CHOLHDL, LDLDIRECT in the last 72 hours. Thyroid Function Tests: No results for input(s): TSH, T4TOTAL, FREET4, T3FREE, THYROIDAB in the last 72 hours. Anemia Panel: No results for input(s): VITAMINB12, FOLATE, FERRITIN, TIBC, IRON, RETICCTPCT in the last 72  hours. Sepsis Labs: Recent Labs  Lab 09/11/2020 1528 09/02/2020 1721 09/13/20 0051 09/13/20 0237  PROCALCITON  --   --  0.28  --   LATICACIDVEN 1.9 2.5* 2.2* 1.3    Recent Results (from the past 240 hour(s))  Resp Panel by RT-PCR (Flu A&B, Covid) Nasopharyngeal Swab     Status: None   Collection Time: 09/08/20  7:24 PM   Specimen: Nasopharyngeal Swab; Nasopharyngeal(NP) swabs in vial transport medium  Result Value Ref Range Status   SARS Coronavirus 2 by RT PCR NEGATIVE NEGATIVE Final    Comment: (NOTE) SARS-CoV-2 target nucleic acids are NOT DETECTED.  The SARS-CoV-2 RNA is generally detectable in upper respiratory specimens during the acute phase of infection. The lowest concentration of SARS-CoV-2 viral copies this assay can detect is 138  copies/mL. A negative result does not preclude SARS-Cov-2 infection and should not be used as the sole basis for treatment or other patient management decisions. A negative result may occur with  improper specimen collection/handling, submission of specimen other than nasopharyngeal swab, presence of viral mutation(s) within the areas targeted by this assay, and inadequate number of viral copies(<138 copies/mL). A negative result must be combined with clinical observations, patient history, and epidemiological information. The expected result is Negative.  Fact Sheet for Patients:  EntrepreneurPulse.com.au  Fact Sheet for Healthcare Providers:  IncredibleEmployment.be  This test is no t yet approved or cleared by the [place] FDA and  has been authorized for detection and/or diagnosis of SARS-CoV-2 by FDA under an Emergency Use Authorization (EUA). This EUA will remain  in effect (meaning this test can be used) for the duration of the COVID-19 declaration under Section 564(b)(1) of the Act, 21 U.S.C.section 360bbb-3(b)(1), unless the authorization is terminated  or revoked sooner.       Influenza A by PCR NEGATIVE NEGATIVE Final   Influenza B by PCR NEGATIVE NEGATIVE Final    Comment: (NOTE) The Xpert Xpress SARS-CoV-2/FLU/RSV plus assay is intended as an aid in the diagnosis of influenza from Nasopharyngeal swab specimens and should not be used as a sole basis for treatment. Nasal washings and aspirates are unacceptable for Xpert Xpress SARS-CoV-2/FLU/RSV testing.  Fact Sheet for Patients: EntrepreneurPulse.com.au  Fact Sheet for Healthcare Providers: IncredibleEmployment.be  This test is not yet approved or cleared by the [place] FDA and has been authorized for detection and/or diagnosis of SARS-CoV-2 by FDA under an Emergency Use Authorization (EUA). This EUA will remain in effect (meaning  this test can be used) for the duration of the COVID-19 declaration under Section 564(b)(1) of the Act, 21 U.S.C. section 360bbb-3(b)(1), unless the authorization is terminated or revoked.  Performed at [facility], [address] [place], [place] [address]   Urine culture     Status: None   Collection Time: 08/27/2020  3:20 PM   Specimen: In/Out Cath Urine  Result Value Ref Range Status   Specimen Description   Final    IN/OUT CATH URINE Performed at [facility], [address], [place], [place] [address]    Special Requests   Final    NONE Performed at [facility], [address] [place], [place] [address]    Culture   Final    NO GROWTH Performed at [facility], [place] [address], [place], [place] [address]    Report Status 09/13/2020 FINAL  Final  Resp Panel by RT-PCR (Flu A&B, Covid) Nasopharyngeal Swab     Status: None   Collection Time: 09/08/2020  3:27 PM   Specimen: Nasopharyngeal Swab; Nasopharyngeal(NP) swabs in vial transport medium  Result Value Ref Range Status   SARS Coronavirus 2 by RT PCR NEGATIVE NEGATIVE Final    Comment: (NOTE) SARS-CoV-2 target nucleic acids are NOT DETECTED.  The SARS-CoV-2 RNA is generally detectable in upper respiratory specimens during the acute phase of infection. The lowest concentration of SARS-CoV-2 viral copies this assay can detect is 138 copies/mL. A negative result does not preclude SARS-Cov-2 infection and should not be used as the sole basis for treatment or other patient management decisions. A negative result may occur with  improper specimen collection/handling, submission of specimen other than nasopharyngeal swab, presence of viral mutation(s) within the areas targeted by this assay, and inadequate number of viral copies(<138 copies/mL). A negative result must be combined with clinical observations, patient history, and epidemiological information. The expected result is Negative.  Fact Sheet for Patients:   EntrepreneurPulse.com.au  Fact Sheet for Healthcare Providers:  IncredibleEmployment.be  This test is no t yet approved or cleared by the [place] FDA and  has been authorized for detection and/or diagnosis of SARS-CoV-2 by FDA under an Emergency Use Authorization (EUA). This EUA will remain  in effect (meaning this test can be used) for the duration of the COVID-19 declaration under Section 564(b)(1) of the Act, 21 U.S.C.section 360bbb-3(b)(1), unless the authorization is terminated  or revoked sooner.       Influenza A by PCR NEGATIVE NEGATIVE Final   Influenza B by PCR NEGATIVE NEGATIVE Final    Comment: (NOTE) The Xpert Xpress SARS-CoV-2/FLU/RSV plus assay is intended as an aid in the diagnosis of influenza from Nasopharyngeal swab specimens and should not be used as a sole basis for treatment. Nasal washings and aspirates are unacceptable for Xpert Xpress SARS-CoV-2/FLU/RSV testing.  Fact Sheet for Patients: EntrepreneurPulse.com.au  Fact Sheet for Healthcare Providers: IncredibleEmployment.be  This test is not yet approved or cleared by the [place] FDA and has been authorized for detection and/or diagnosis of SARS-CoV-2 by FDA under an Emergency Use Authorization (EUA). This EUA will remain in effect (meaning this test can be used) for the duration of the COVID-19 declaration under Section 564(b)(1) of the Act, 21 U.S.C. section 360bbb-3(b)(1), unless the authorization is terminated or revoked.  Performed at [facility], [address], [place], [place] [address]   Blood Culture (routine x 2)     Status: None   Collection Time: 09/20/2020  3:29 PM   Specimen: BLOOD  Result Value Ref Range Status   Specimen Description BLOOD PORTA CATH  Final   Special Requests   Final    BOTTLES DRAWN AEROBIC AND ANAEROBIC Blood Culture adequate volume   Culture   Final    NO GROWTH 5 DAYS Performed  at [facility], [address] [place], [place] [address]    Report Status 09/17/2020 FINAL  Final  Blood Culture (routine x 2)     Status: None   Collection Time: 09/14/2020  3:38 PM   Specimen: BLOOD  Result Value Ref Range Status   Specimen Description BLOOD BLOOD RIGHT WRIST  Final   Special Requests   Final    BOTTLES DRAWN AEROBIC AND ANAEROBIC Blood Culture results may not be optimal due to an inadequate volume of blood received in culture bottles   Culture   Final    NO GROWTH 5 DAYS Performed at [facility], [address], [place], [place] [address]    Report Status 09/17/2020 FINAL  Final  MRSA PCR Screening     Status: None   Collection Time: 09/07/2020  3:59 PM   Specimen: Nasopharyngeal  Result Value Ref Range Status   MRSA by PCR NEGATIVE NEGATIVE Final    Comment:        The GeneXpert MRSA Assay (FDA approved for NASAL specimens only), is one component of a comprehensive MRSA colonization surveillance program. It is not intended to diagnose MRSA infection nor to guide or monitor treatment for MRSA infections. Performed at [facility], [address] [place], [place] [address]   Culture, blood (routine x 2)     Status: None (Preliminary result)   Collection Time: 09/13/20 12:51 AM   Specimen: BLOOD  Result Value Ref Range Status   Specimen Description BLOOD RIGHT ANTECUBITAL  Final   Special Requests   Final    BOTTLES DRAWN AEROBIC AND ANAEROBIC Blood Culture adequate volume   Culture   Final    NO GROWTH 4 DAYS Performed at [facility], [address] [place], [place] [address]    Report Status PENDING  Incomplete  Culture, blood (routine x 2)     Status: None (Preliminary result)   Collection Time: 09/13/20 12:51 AM   Specimen: BLOOD LEFT HAND  Result Value Ref Range Status   Specimen Description BLOOD LEFT HAND  Final   Special Requests   Final    BOTTLES DRAWN AEROBIC AND ANAEROBIC Blood Culture adequate volume   Culture   Final    NO GROWTH 4  DAYS Performed at [facility], [address], [place], [place] [address]    Report Status PENDING  Incomplete         Radiology Studies: DG Chest Port 1 View  Result Date: 09/16/2020 CLINICAL DATA:  Respiratory failure.  Pneumonia. EXAM: PORTABLE CHEST 1 VIEW COMPARISON:  Chest radiograph December 19, 21. CTA neck December 19, 21. FINDINGS: Endotracheal tube tip is approximately 4.8 cm above the carina. Similar position of a left Port-A-Cath with the tip projecting in the region of the superior cavoatrial junction. Improved lung opacities. Hazy left lung base opacity. No visible pleural effusions or pneumothorax. IMPRESSION: 1. Appropriate positioning of lines/tubes, as detailed above. 2. Improved lung opacities, compatible with improving pneumonia. 3. Subtle hazy opacity at the left lung base may represent layering small pleural effusion and/or atelectasis on this semi upright radiograph. Electronically Signed   By: Margaretha Sheffield MD   On: 09/16/2020 08:00        Scheduled Meds: . Chlorhexidine Gluconate Cloth  6 each Topical Daily  . glycopyrrolate  0.2 mg Intravenous Q4H  . sodium chloride flush  3 mL Intravenous Q12H   Continuous Infusions: . sodium chloride    . morphine 1 mg/hr (09/17/20 1221)     LOS: 5 days    Time spent: 35 minutes    John Cutbirth D Manuella Ghazi, DO [facility]  If 7PM-7AM, please contact night-coverage [email or web address] 09/17/2020, 2:21 PM

## 2020-09-17 NOTE — Progress Notes (Signed)
RT extubated patient at 1130hr  Per MD order to 2L O2 via nasal cannula. Patient family and chaplain at bedside

## 2020-09-18 DIAGNOSIS — G9341 Metabolic encephalopathy: Secondary | ICD-10-CM | POA: Diagnosis not present

## 2020-09-18 LAB — CULTURE, BLOOD (ROUTINE X 2)
Culture: NO GROWTH
Culture: NO GROWTH
Special Requests: ADEQUATE
Special Requests: ADEQUATE

## 2020-09-18 NOTE — Progress Notes (Addendum)
Patients daughter voiced concerns regarding patients care and treatment plan. This nurse paged Dr. Manuella Ghazi to see if he would come to the room to speak to the family. Dr. Manuella Ghazi stated he was not on campus but would speak to the family over phone. The patients daughter gave this nurse her phone number to give to Dr. Manuella Ghazi. Dr. Manuella Ghazi called daughter to discuss concerns.

## 2020-09-18 NOTE — Progress Notes (Signed)
Family in room asking a lot of questions about patients condition. The family stated that "his arms where swollen and cold before now they are not so that means he's getting better right?", "You all are giving him all this medicine (morphine) making him sleep so he can wake up to eat!". I informed them that im a cna that I cant really answer that for them but ill let the nurse know. I told them that hes on comfort care that we are trying to make sure he stays comfortable and if he wakes up and asks for something to eat or drink that I will make sure its ok first then get it for him. I asked the family if there was anything I could get for them and that I was going to take his vital signs while I was in the room. They took pictures on there cell phones of the vital signs, normal range except respirations low, and me taking them without asking or telling me they where. When I left the room and looked at my notes I seen the previous cna reported not to do blood pressure in left side which is where I took it. I notified the patients nurse of everything that happened.

## 2020-09-18 NOTE — Progress Notes (Signed)
PROGRESS NOTE    John Claire Sr.  RSW:[id number] DOB: 24-Dec-1950 DOA: 10/09/2020 PCP: Freddy Finner, NP   Brief Narrative:  As per H&P written by Dr. 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 Haywood Pao presents to the emergency department via EMS due to being unresponsive at home. Patient was recently admitted on 12/15 and discharged on 12/18 due to DKA. Patient was unable to provide history due to being intubated and sedated, history was obtained fromdaughter at bedside,ED physician and ED medical record. Per report, patient went to use the restroom around4 AM this morning,around 10 AM, he told the son that he was going back to bed. Daughter at bedside called from work this afternoon to check on patient, and patient's wife tried to wake him up at2 PM today but hewaslethargic andunresponsive, EMS was activated, on arrival of EMS team, blood glucose was noted to be in the 30s, 1 amp of D50 was given in the field, but blood glucose was still 31 when patient arrived to the ED. Per daughter, a usedinsulin syringewas noted in the trash and patient was reported to have had only one sandwich last night.  ED Course: In the emergency department, he was febrile, tachycardic, and intermittently tachypneic. Several rounds of IV D50 were given due to hypoglycemia and patient was started on IV D10W. Teleneurology was consulted by ED physician, and CT of head without contrast done showed no acute intracranial process. CT angiography of head and neck showed no acute large or medium vessel occlusion. Patient was intubated due to inability to protect airway. Chest x-ray done showed hazy bilateral airspace opacities  which may represent multifocal pneumonia in the appropriate clinical setting. Patient was empirically started on IV antibiotics (vancomycin, doxycycline and cefepime). PCCM was consulted, no beds in [facility] and [facility],it was [facility] for [facility] labs and ABG by [facility] with plan to be seen here in the morning by PCCM. Hospitalist was asked to admit patient for further evaluation management.  -Patient has been terminally extubated and is now on comfort care.  Assessment & Plan:   Active Problems:   Essential hypertension   Pancreatic cancer metastasized to liver (HCC)   Port-A-Cath in place   Acute metabolic encephalopathy   Hypoglycemia   Multifocal pneumonia   Hypoalbuminemia   Lactic acidosis   Elevated liver enzymes   Prolonged QT interval   Sepsis (HCC)   Palliative care by specialist   1-acute metabolic encephalopathy in the setting of hypoglycemia-persistent   2-multifocalaspirationpneumonia -Hold further antibiotic treatments -That is post terminal extubation on 12/24  3-severe sepsis: POA   4-prolonged QT  5-metastatic pancreatic cancer -Currently on comfort care status post terminal extubation -Continue morphine infusion for comfort and anticipate hospital death   -Transfer to MedSurg floor and continue on comfort measures with anticipated hospital death.  DVT prophylaxis:Lovenox Code Status:DNR Family Communication:daughter at bedside12/25 Disposition:  Status is: Inpatient  Dispo: The patient is from: home Anticipated d/c is to:Anticipated hospital death Anticipated d/c date KX:[id number] in-hospital death Patient currently no medically ready for discharge.   Consultants:  PCCM  Palliative care   Procedures: See below for x-ray report.   Antimicrobials:  Cefepime-DC 12/24 Doxycycline-DC 12/24  Subjective: Patient seen and  evaluated today and is unresponsive on morphine drip.  Daughter is  at bedside.  Objective: Vitals:   09/17/20 1600 09/17/20 1700 09/17/20 2147 09/18/20 1455  BP:   99/72 107/73  Pulse: 68 63 78 91  Resp: (!) 9 (!) 9 (!) 8 (!) 9  Temp:   97.9 F (36.6 C) 98.8 F (37.1 C)  TempSrc:    Oral  SpO2: 100% 100% 95% 93%  Weight:      Height:        Intake/Output Summary (Last 24 hours) at 09/18/2020 1459 Last data filed at 09/18/2020 0500 Gross per 24 hour  Intake 3121.95 ml  Output 700 ml  Net 2421.95 ml   Filed Weights   09/15/20 0452 09/16/20 0500 09/17/20 0500  Weight: 66.1 kg 64.9 kg 64.9 kg    Examination:  General exam: Unresponsive Respiratory system: Clear to auscultation. Respiratory effort normal. Cardiovascular system: S1 & S2 heard, RRR.  Gastrointestinal system: Abdomen is soft Central nervous system: Unresponsive Extremities: No edema Skin: No significant lesions noted Psychiatry: Cannot be assessed    Data Reviewed: I have personally reviewed following labs and imaging studies  CBC: Recent Labs  Lab 2020/09/15 1528 09/13/20 0238 09/16/20 0448  WBC 4.9 5.3 8.1  NEUTROABS 3.9  --   --   HGB 11.1* 10.0* 11.4*  HCT 34.4* 30.5* 35.1*  MCV 93.5 92.4 93.9  PLT 185 135* 175   Basic Metabolic Panel: Recent Labs  Lab 2020-09-15 1528 09/13/20 0051 09/13/20 0238 09/15/20 0429 09/16/20 0448  NA 131* 132* 133*  --  134*  K 3.6 2.9* 2.8*  --  3.9  CL 100 103 104  --  107  CO2 25 22 23   --  19*  GLUCOSE 189* 70 58*  --  301*  BUN 8 7* 6*  --  16  CREATININE 0.41* 0.40* 0.42* 0.49* 0.54*  CALCIUM 8.1* 7.6* 7.7*  --  8.1*  MG  --   --  1.6*  --  1.8  PHOS  --   --  3.0  --  2.8   GFR: Estimated Creatinine Clearance: 81.1 mL/min (A) (by C-G formula based on SCr of 0.54 mg/dL (L)). Liver Function Tests: Recent Labs  Lab 2020/09/15 1528 09/13/20 0238  AST 71* 73*  ALT 44 38  ALKPHOS 276* 232*  BILITOT 0.6 0.5  PROT 6.2* 5.5*  ALBUMIN 2.6* 2.1*    No results for input(s): LIPASE, AMYLASE in the last 168 hours. No results for input(s): AMMONIA in the last 168 hours. Coagulation Profile: Recent Labs  Lab 09-15-20 1528 09/13/20 0238  INR 0.9 1.1   Cardiac Enzymes: No results for input(s): CKTOTAL, CKMB, CKMBINDEX, TROPONINI in the last 168 hours. BNP (last 3 results) No results for input(s): PROBNP in the last 8760 hours. HbA1C: No results for input(s): HGBA1C in the last 72 hours. CBG: Recent Labs  Lab 09/16/20 2004 09/17/20 0020 09/17/20 0440 09/17/20 0826 09/17/20 0907  GLUCAP 178* 154* 73 66* 126*   Lipid Profile: No results for input(s): CHOL, HDL, LDLCALC, TRIG, CHOLHDL, LDLDIRECT in the last 72 hours. Thyroid Function Tests: No results for input(s): TSH, T4TOTAL, FREET4, T3FREE, THYROIDAB in the last 72 hours. Anemia Panel: No results for input(s): VITAMINB12, FOLATE, FERRITIN, TIBC, IRON, RETICCTPCT in the last 72 hours. Sepsis Labs: Recent Labs  Lab 09-15-20 1528 Sep 15, 2020 1721 09/13/20 0051 09/13/20 0237  PROCALCITON  --   --  0.28  --   LATICACIDVEN 1.9 2.5* 2.2* 1.3    Recent Results (from the past 240 hour(s))  Resp Panel  by RT-PCR (Flu A&B, Covid) Nasopharyngeal Swab     Status: None   Collection Time: 09/08/20  7:24 PM   Specimen: Nasopharyngeal Swab; Nasopharyngeal(NP) swabs in vial transport medium  Result Value Ref Range Status   SARS Coronavirus 2 by RT PCR NEGATIVE NEGATIVE Final    Comment: (NOTE) SARS-CoV-2 target nucleic acids are NOT DETECTED.  The SARS-CoV-2 RNA is generally detectable in upper respiratory specimens during the acute phase of infection. The lowest concentration of SARS-CoV-2 viral copies this assay can detect is 138 copies/mL. A negative result does not preclude SARS-Cov-2 infection and should not be used as the sole basis for treatment or other patient management decisions. A negative result may occur with  improper specimen collection/handling, submission of  specimen other than nasopharyngeal swab, presence of viral mutation(s) within the areas targeted by this assay, and inadequate number of viral copies(<138 copies/mL). A negative result must be combined with clinical observations, patient history, and epidemiological information. The expected result is Negative.  Fact Sheet for Patients:  EntrepreneurPulse.com.au  Fact Sheet for Healthcare Providers:  IncredibleEmployment.be  This test is no t yet approved or cleared by the [place] FDA and  has been authorized for detection and/or diagnosis of SARS-CoV-2 by FDA under an Emergency Use Authorization (EUA). This EUA will remain  in effect (meaning this test can be used) for the duration of the COVID-19 declaration under Section 564(b)(1) of the Act, 21 U.S.C.section 360bbb-3(b)(1), unless the authorization is terminated  or revoked sooner.       Influenza A by PCR NEGATIVE NEGATIVE Final   Influenza B by PCR NEGATIVE NEGATIVE Final    Comment: (NOTE) The Xpert Xpress SARS-CoV-2/FLU/RSV plus assay is intended as an aid in the diagnosis of influenza from Nasopharyngeal swab specimens and should not be used as a sole basis for treatment. Nasal washings and aspirates are unacceptable for Xpert Xpress SARS-CoV-2/FLU/RSV testing.  Fact Sheet for Patients: EntrepreneurPulse.com.au  Fact Sheet for Healthcare Providers: IncredibleEmployment.be  This test is not yet approved or cleared by the [place] FDA and has been authorized for detection and/or diagnosis of SARS-CoV-2 by FDA under an Emergency Use Authorization (EUA). This EUA will remain in effect (meaning this test can be used) for the duration of the COVID-19 declaration under Section 564(b)(1) of the Act, 21 U.S.C. section 360bbb-3(b)(1), unless the authorization is terminated or revoked.  Performed at [facility], [address],  [place], [place] [address]   Urine culture     Status: None   Collection Time: 09-16-2020  3:20 PM   Specimen: In/Out Cath Urine  Result Value Ref Range Status   Specimen Description   Final    IN/OUT CATH URINE Performed at [facility], [address], [place], [place] [address]    Special Requests   Final    NONE Performed at [facility], [address] [place], [place] [address]    Culture   Final    NO GROWTH Performed at [facility], [address], [place], [place] [address]    Report Status 09/13/2020 FINAL  Final  Resp Panel by RT-PCR (Flu A&B, Covid) Nasopharyngeal Swab     Status: None   Collection Time: 16-Sep-2020  3:27 PM   Specimen: Nasopharyngeal Swab; Nasopharyngeal(NP) swabs in vial transport medium  Result Value Ref Range Status   SARS Coronavirus 2 by RT PCR NEGATIVE NEGATIVE Final    Comment: (NOTE) SARS-CoV-2 target nucleic acids are NOT DETECTED.  The SARS-CoV-2 RNA  is generally detectable in upper respiratory specimens during the acute phase of infection. The lowest concentration of SARS-CoV-2 viral copies this assay can detect is 138 copies/mL. A negative result does not preclude SARS-Cov-2 infection and should not be used as the sole basis for treatment or other patient management decisions. A negative result may occur with  improper specimen collection/handling, submission of specimen other than nasopharyngeal swab, presence of viral mutation(s) within the areas targeted by this assay, and inadequate number of viral copies(<138 copies/mL). A negative result must be combined with clinical observations, patient history, and epidemiological information. The expected result is Negative.  Fact Sheet for Patients:  BloggerCourse.com  Fact Sheet for Healthcare Providers:  SeriousBroker.it  This test is no t yet approved or cleared by the [place] FDA and  has been authorized for detection and/or  diagnosis of SARS-CoV-2 by FDA under an Emergency Use Authorization (EUA). This EUA will remain  in effect (meaning this test can be used) for the duration of the COVID-19 declaration under Section 564(b)(1) of the Act, 21 U.S.C.section 360bbb-3(b)(1), unless the authorization is terminated  or revoked sooner.       Influenza A by PCR NEGATIVE NEGATIVE Final   Influenza B by PCR NEGATIVE NEGATIVE Final    Comment: (NOTE) The Xpert Xpress SARS-CoV-2/FLU/RSV plus assay is intended as an aid in the diagnosis of influenza from Nasopharyngeal swab specimens and should not be used as a sole basis for treatment. Nasal washings and aspirates are unacceptable for Xpert Xpress SARS-CoV-2/FLU/RSV testing.  Fact Sheet for Patients: BloggerCourse.com  Fact Sheet for Healthcare Providers: SeriousBroker.it  This test is not yet approved or cleared by the [place] FDA and has been authorized for detection and/or diagnosis of SARS-CoV-2 by FDA under an Emergency Use Authorization (EUA). This EUA will remain in effect (meaning this test can be used) for the duration of the COVID-19 declaration under Section 564(b)(1) of the Act, 21 U.S.C. section 360bbb-3(b)(1), unless the authorization is terminated or revoked.  Performed at [facility], [address] [place], [place] [address]   Blood Culture (routine x 2)     Status: None   Collection Time: 16-Sep-2020  3:29 PM   Specimen: BLOOD  Result Value Ref Range Status   Specimen Description BLOOD PORTA CATH  Final   Special Requests   Final    BOTTLES DRAWN AEROBIC AND ANAEROBIC Blood Culture adequate volume   Culture   Final    NO GROWTH 5 DAYS Performed at [facility], [address], [place], [place] [address]    Report Status 09/17/2020 FINAL  Final  Blood Culture (routine x 2)     Status: None   Collection Time: 09-16-20  3:38 PM   Specimen: BLOOD  Result Value Ref Range Status    Specimen Description BLOOD BLOOD RIGHT WRIST  Final   Special Requests   Final    BOTTLES DRAWN AEROBIC AND ANAEROBIC Blood Culture results may not be optimal due to an inadequate volume of blood received in culture bottles   Culture   Final    NO GROWTH 5 DAYS Performed at [facility], [address], [place], [place] [address]    Report Status 09/17/2020 FINAL  Final  MRSA PCR Screening     Status: None   Collection Time: 09-16-2020  3:59 PM   Specimen: Nasopharyngeal  Result Value Ref Range Status   MRSA by PCR NEGATIVE NEGATIVE Final    Comment:  The GeneXpert MRSA Assay (FDA approved for NASAL specimens only), is one component of a comprehensive MRSA colonization surveillance program. It is not intended to diagnose MRSA infection nor to guide or monitor treatment for MRSA infections. Performed at [facility], [address] [place], [place] [address]   Culture, blood (routine x 2)     Status: None   Collection Time: 09/13/20 12:51 AM   Specimen: BLOOD  Result Value Ref Range Status   Specimen Description BLOOD RIGHT ANTECUBITAL  Final   Special Requests   Final    BOTTLES DRAWN AEROBIC AND ANAEROBIC Blood Culture adequate volume   Culture   Final    NO GROWTH 5 DAYS Performed at [facility], [address] [place], [place] [address]    Report Status 09/18/2020 FINAL  Final  Culture, blood (routine x 2)     Status: None   Collection Time: 09/13/20 12:51 AM   Specimen: BLOOD LEFT HAND  Result Value Ref Range Status   Specimen Description BLOOD LEFT HAND  Final   Special Requests   Final    BOTTLES DRAWN AEROBIC AND ANAEROBIC Blood Culture adequate volume   Culture   Final    NO GROWTH 5 DAYS Performed at [facility], [address], [place], [place] [address]    Report Status 09/18/2020 FINAL  Final         Radiology Studies: No results found.      Scheduled Meds: . Chlorhexidine Gluconate Cloth  6 each Topical Daily  . glycopyrrolate  0.2 mg  Intravenous Q4H  . sodium chloride flush  3 mL Intravenous Q12H   Continuous Infusions: . sodium chloride    . morphine 1 mg/hr (09/17/20 1816)     LOS: 6 days    Time spent: 35 minutes    Taniah Reinecke D Manuella Ghazi, DO [facility]  If 7PM-7AM, please contact night-coverage [email or web address] 09/18/2020, 2:59 PM

## 2020-09-19 DIAGNOSIS — G9341 Metabolic encephalopathy: Secondary | ICD-10-CM | POA: Diagnosis not present

## 2020-09-19 LAB — GLUCOSE, CAPILLARY: Glucose-Capillary: 99 mg/dL (ref 70–99)

## 2020-09-19 NOTE — Progress Notes (Signed)
Mouthcare and reposition pt. Family at bedside. Sister now at bedside and agrees with current treatment plan. Family refused finger stick for blood glucose

## 2020-09-19 NOTE — Progress Notes (Signed)
PROGRESS NOTE    John Chilson Sr.  [id number] DOB: 10-07-1950 DOA: 09/13/2020 PCP: Perlie Mayo, NP   Brief Narrative:  As per H&P written by Dr. 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 Theodoro Kalata presents to the emergency department via EMS due to being unresponsive at home. Patient was recently admitted on 12/15 and discharged on 12/18 due to DKA. Patient was unable to provide history due to being intubated and sedated, history was obtained fromdaughter at bedside,ED physician and ED medical record. Per report, patient went to use the restroom around4 AM this morning,around 10 AM, he told the son that he was going back to bed. Daughter at bedside called from work this afternoon to check on patient, and patient's wife tried to wake him up at2 PM today but hewaslethargic andunresponsive, EMS was activated, on arrival of EMS team, blood glucose was noted to be in the 30s, 1 amp of D50 was given in the field, but blood glucose was still 31 when patient arrived to the ED. Per daughter, a usedinsulin syringewas noted in the trash and patient was reported to have had only one sandwich last night.  ED Course: In the emergency department, he was febrile, tachycardic, and intermittently tachypneic. Several rounds of IV D50 were given due to hypoglycemia and patient was started on IV D10W. Teleneurology was consulted by ED physician, and CT of head without contrast done showed no acute intracranial process. CT angiography of head and neck showed no acute large or medium vessel occlusion. Patient was intubated due to inability to protect airway. Chest x-ray done showed hazy bilateral airspace opacities  which may represent multifocal pneumonia in the appropriate clinical setting. Patient was empirically started on IV antibiotics (vancomycin, doxycycline and cefepime). PCCM was consulted, no beds in [facility],it was [facility] labs and ABG by [facility] with plan to be seen here in the morning by PCCM. Hospitalist was asked to admit patient for further evaluation management.  -Patient has been terminally extubated and is now on comfort care.   Assessment & Plan:   Active Problems:   Essential hypertension   Pancreatic cancer metastasized to liver (HCC)   Port-A-Cath in place   Acute metabolic encephalopathy   Hypoglycemia   Multifocal pneumonia   Hypoalbuminemia   Lactic acidosis   Elevated liver enzymes   Prolonged QT interval   Sepsis ([place])   Palliative care by specialist   1-acute metabolic encephalopathy in the setting of hypoglycemia-persistent   2-multifocalaspirationpneumonia -Hold further antibiotic treatments -That is post terminal extubation on 12/24  3-severe sepsis: POA   4-prolonged QT  5-metastatic pancreatic cancer -Currently on comfort care status post terminal extubation -Continue morphine infusion for comfort and anticipate hospital death   -Transfer to [place] floor and continue on comfort measures with anticipated hospital death.  DVT prophylaxis:Lovenox Code Status:DNR Family Communication:daughter at bedside12/25 Disposition:  Status is: Inpatient  Dispo: The patient is from: home Anticipated d/c is to:Anticipated hospital death Anticipated d/c date [id number] in-hospital death Patient currently no medically ready for discharge.   Consultants:  PCCM  Palliative care   Procedures: See below for x-ray report.   Antimicrobials:  Cefepime-DC 12/24 Doxycycline-DC 12/24   Subjective: Patient seen and  evaluated today and remains unresponsive on morphine drip.  Family members had some difficulty understanding the need for morphine drip and comfort care on 12/25.  Daughter is requesting that usual visiting hours are enforced.  Objective: Vitals:   09/17/20 1700 09/17/20 2147 09/18/20 1455 09/18/20 2047  BP:  99/72 107/73 111/77  Pulse: 63 78 91 95  Resp: (!) 9 (!) 8 (!) 9 (!) 8  Temp:  97.9 F (36.6 C) 98.8 F (37.1 C) 97.6 F (36.4 C)  TempSrc:   Oral Oral  SpO2: 100% 95% 93% 92%  Weight:      Height:        Intake/Output Summary (Last 24 hours) at 09/19/2020 1220 Last data filed at 09/19/2020 0900 Gross per 24 hour  Intake 11.35 ml  Output --  Net 11.35 ml   Filed Weights   09/15/20 0452 09/16/20 0500 09/17/20 0500  Weight: 66.1 kg 64.9 kg 64.9 kg    Examination:  General exam: Unresponsive Respiratory system: Clear to auscultation. Respiratory effort normal. Cardiovascular system: S1 & S2 heard, RRR.  Gastrointestinal system: Abdomen is soft Central nervous system: Unresponsive Extremities: No edema Skin: No significant lesions noted Psychiatry: Cannot be assessed    Data Reviewed: I have personally reviewed following labs and imaging studies  CBC: Recent Labs  Lab 09/06/2020 1528 09/13/20 0238 09/16/20 0448  WBC 4.9 5.3 8.1  NEUTROABS 3.9  --   --   HGB 11.1* 10.0* 11.4*  HCT 34.4* 30.5* 35.1*  MCV 93.5 92.4 93.9  PLT 185 135* 573   Basic Metabolic Panel: Recent Labs  Lab 09/19/2020 1528 09/13/20 0051 09/13/20 0238 09/15/20 0429 09/16/20 0448  NA 131* 132* 133*  --  134*  K 3.6 2.9* 2.8*  --  3.9  CL 100 103 104  --  107  CO2 25 22 23   --  19*  GLUCOSE 189* 70 58*  --  301*  BUN 8 7* 6*  --  16  CREATININE 0.41* 0.40* 0.42* 0.49* 0.54*  CALCIUM 8.1* 7.6* 7.7*  --  8.1*  MG  --   --  1.6*  --  1.8  PHOS  --   --  3.0  --  2.8   GFR: Estimated Creatinine Clearance: 81.1 mL/min (A) (by C-G formula based on SCr of 0.54 mg/dL (L)). Liver  Function Tests: Recent Labs  Lab 09/21/2020 1528 09/13/20 0238  AST 71* 73*  ALT 44 38  ALKPHOS 276* 232*  BILITOT 0.6 0.5  PROT 6.2* 5.5*  ALBUMIN 2.6* 2.1*   No results for input(s): LIPASE, AMYLASE in the last 168 hours. No results for input(s): AMMONIA in the last 168 hours. Coagulation Profile: Recent Labs  Lab 08/30/2020 1528 09/13/20 0238  INR 0.9 1.1   Cardiac Enzymes: No results for input(s): CKTOTAL, CKMB, CKMBINDEX, TROPONINI in the last 168 hours. BNP (last 3 results) No results for input(s): PROBNP in the last 8760 hours. HbA1C: No results for input(s): HGBA1C in the last 72 hours. CBG: Recent Labs  Lab 09/17/20 0020 09/17/20 0440 09/17/20 0826 09/17/20 0907 09/19/20 0118  GLUCAP 154* 73 66* 126* 99   Lipid Profile: No results for input(s): CHOL, HDL, LDLCALC, TRIG, CHOLHDL, LDLDIRECT in the last 72 hours. Thyroid Function Tests: No results for input(s): TSH, T4TOTAL, FREET4, T3FREE, THYROIDAB in the last 72 hours. Anemia Panel: No results for input(s): VITAMINB12, FOLATE, FERRITIN, TIBC, IRON, RETICCTPCT in the last 72 hours. Sepsis Labs: Recent Labs  Lab 09/08/2020 1528 09/03/2020 1721 09/13/20 0051 09/13/20 0237  PROCALCITON  --   --  0.28  --   LATICACIDVEN 1.9 2.5* 2.2* 1.3    Recent Results (from the past 240 hour(s))  Urine culture     Status: None   Collection Time: 09-04-2020  3:20 PM   Specimen: In/Out Cath Urine  Result Value Ref Range Status   Specimen Description   Final    IN/OUT CATH URINE Performed at [facility], [address] [place], [place] [address]    Special Requests   Final    NONE Performed at [facility], [address] [place], [place] [address]    Culture   Final    NO GROWTH Performed at [facility], [address] [place], [place] [address]    Report Status 09/13/2020 FINAL  Final  Resp Panel by RT-PCR (Flu A&B, Covid) Nasopharyngeal Swab     Status: None   Collection Time: 09-04-2020  3:27 PM    Specimen: Nasopharyngeal Swab; Nasopharyngeal(NP) swabs in vial transport medium  Result Value Ref Range Status   SARS Coronavirus 2 by RT PCR NEGATIVE NEGATIVE Final    Comment: (NOTE) SARS-CoV-2 target nucleic acids are NOT DETECTED.  The SARS-CoV-2 RNA is generally detectable in upper respiratory specimens during the acute phase of infection. The lowest concentration of SARS-CoV-2 viral copies this assay can detect is 138 copies/mL. A negative result does not preclude SARS-Cov-2 infection and should not be used as the sole basis for treatment or other patient management decisions. A negative result may occur with  improper specimen collection/handling, submission of specimen other than nasopharyngeal swab, presence of viral mutation(s) within the areas targeted by this assay, and inadequate number of viral copies(<138 copies/mL). A negative result must be combined with clinical observations, patient history, and epidemiological information. The expected result is Negative.  Fact Sheet for Patients:  BloggerCourse.comhttps://www.fda.gov/media/152166/download  Fact Sheet for Healthcare Providers:  SeriousBroker.ithttps://www.fda.gov/media/152162/download  This test is no t yet approved or cleared by the [place] FDA and  has been authorized for detection and/or diagnosis of SARS-CoV-2 by FDA under an Emergency Use Authorization (EUA). This EUA will remain  in effect (meaning this test can be used) for the duration of the COVID-19 declaration under Section 564(b)(1) of the Act, 21 U.S.C.section 360bbb-3(b)(1), unless the authorization is terminated  or revoked sooner.       Influenza A by PCR NEGATIVE NEGATIVE Final   Influenza B by PCR NEGATIVE NEGATIVE Final    Comment: (NOTE) The Xpert Xpress SARS-CoV-2/FLU/RSV plus assay is intended as an aid in the diagnosis of influenza from Nasopharyngeal swab specimens and should not be used as a sole basis for treatment. Nasal washings and aspirates are  unacceptable for Xpert Xpress SARS-CoV-2/FLU/RSV testing.  Fact Sheet for Patients: BloggerCourse.comhttps://www.fda.gov/media/152166/download  Fact Sheet for Healthcare Providers: SeriousBroker.ithttps://www.fda.gov/media/152162/download  This test is not yet approved or cleared by the [place] FDA and has been authorized for detection and/or diagnosis of SARS-CoV-2 by FDA under an Emergency Use Authorization (EUA). This EUA will remain in effect (meaning this test can be used) for the duration of the COVID-19 declaration under Section 564(b)(1) of the Act, 21 U.S.C. section 360bbb-3(b)(1), unless the authorization is terminated or revoked.  Performed at [facility], [address] [place], [place] [address]   Blood Culture (routine x 2)     Status: None   Collection Time: 09-04-2020  3:29 PM   Specimen: BLOOD  Result Value Ref Range Status   Specimen Description BLOOD PORTA CATH  Final   Special Requests  Final    BOTTLES DRAWN AEROBIC AND ANAEROBIC Blood Culture adequate volume   Culture   Final    NO GROWTH 5 DAYS Performed at [facility], [address], [place], [place] [address]    Report Status 09/17/2020 FINAL  Final  Blood Culture (routine x 2)     Status: None   Collection Time: 09/19/2020  3:38 PM   Specimen: BLOOD  Result Value Ref Range Status   Specimen Description BLOOD BLOOD RIGHT WRIST  Final   Special Requests   Final    BOTTLES DRAWN AEROBIC AND ANAEROBIC Blood Culture results may not be optimal due to an inadequate volume of blood received in culture bottles   Culture   Final    NO GROWTH 5 DAYS Performed at [facility], [address] [place], [place] [address]    Report Status 09/17/2020 FINAL  Final  MRSA PCR Screening     Status: None   Collection Time: 09/11/2020  3:59 PM   Specimen: Nasopharyngeal  Result Value Ref Range Status   MRSA by PCR NEGATIVE NEGATIVE Final    Comment:        The GeneXpert MRSA Assay (FDA approved for NASAL specimens only), is one component of  a comprehensive MRSA colonization surveillance program. It is not intended to diagnose MRSA infection nor to guide or monitor treatment for MRSA infections. Performed at [facility], [address], [place], [place] [address]   Culture, blood (routine x 2)     Status: None   Collection Time: 09/13/20 12:51 AM   Specimen: BLOOD  Result Value Ref Range Status   Specimen Description BLOOD RIGHT ANTECUBITAL  Final   Special Requests   Final    BOTTLES DRAWN AEROBIC AND ANAEROBIC Blood Culture adequate volume   Culture   Final    NO GROWTH 5 DAYS Performed at [facility], [address], [place], [place] [address]    Report Status 09/18/2020 FINAL  Final  Culture, blood (routine x 2)     Status: None   Collection Time: 09/13/20 12:51 AM   Specimen: BLOOD LEFT HAND  Result Value Ref Range Status   Specimen Description BLOOD LEFT HAND  Final   Special Requests   Final    BOTTLES DRAWN AEROBIC AND ANAEROBIC Blood Culture adequate volume   Culture   Final    NO GROWTH 5 DAYS Performed at [facility], [address] [place], [place] [address]    Report Status 09/18/2020 FINAL  Final         Radiology Studies: No results found.      Scheduled Meds: . Chlorhexidine Gluconate Cloth  6 each Topical Daily  . glycopyrrolate  0.2 mg Intravenous Q4H  . sodium chloride flush  3 mL Intravenous Q12H   Continuous Infusions: . sodium chloride    . morphine 1 mg/hr (09/17/20 1816)     LOS: 7 days    Time spent: 35 minutes    Dymond Gutt D Manuella Ghazi, DO [facility]  If 7PM-7AM, please contact night-coverage [email or web address] 09/19/2020, 12:20 PM

## 2020-09-19 NOTE — Progress Notes (Signed)
Pt daughter and family on video call requested to speak with hospital administrators and the "owners of the hospital" as they were dissatisfied with the care the pt was receiving. The family stated they were lied to and told once he was extubated he would not live long. The family was concerned that the pt has not had anything to eat and he could develop sores in his mouth. The family member on the phone was irate, cursing, and threatening to call a Chief Executive Officer. However, the family at bedside was calm and cooperative. I advised the family that the pt is unable to protect his airway so we are unable to feed him. In addition mouth care was provided. AC was notified and advised she would speak to the family when available.

## 2020-09-20 DIAGNOSIS — G9341 Metabolic encephalopathy: Secondary | ICD-10-CM | POA: Diagnosis not present

## 2020-09-20 DIAGNOSIS — C787 Secondary malignant neoplasm of liver and intrahepatic bile duct: Secondary | ICD-10-CM | POA: Diagnosis not present

## 2020-09-20 DIAGNOSIS — C259 Malignant neoplasm of pancreas, unspecified: Secondary | ICD-10-CM | POA: Diagnosis not present

## 2020-09-20 DIAGNOSIS — Z515 Encounter for palliative care: Secondary | ICD-10-CM | POA: Diagnosis not present

## 2020-09-20 DIAGNOSIS — J9600 Acute respiratory failure, unspecified whether with hypoxia or hypercapnia: Secondary | ICD-10-CM | POA: Diagnosis not present

## 2020-09-20 NOTE — Progress Notes (Signed)
Pt's daughter here, updated on current status and plan of care. States understanding. Advised to notify nurse if any questions or concerns.

## 2020-09-20 NOTE — Progress Notes (Signed)
PROGRESS NOTE    [name].  ZOX:[id number] DOB: 08/14/1951 DOA: 09/02/2020 PCP: Freddy FinnerMills, Hannah M, NP   Brief Narrative:  As per H&P written by Dr. 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 Haywood PaoFongwho presents to the emergency department via EMS due to being unresponsive at home. Patient was recently admitted on 12/15 and discharged on 12/18 due to DKA. Patient was unable to provide history due to being intubated and sedated, history was obtained fromdaughter at bedside,ED physician and ED medical record. Per report, patient went to use the restroom around4 AM this morning,around 10 AM, he told the son that he was going back to bed. Daughter at bedside called from work this afternoon to check on patient, and patient's wife tried to wake him up at2 PM today but hewaslethargic andunresponsive, EMS was activated, on arrival of EMS team, blood glucose was noted to be in the 30s, 1 amp of D50 was given in the field, but blood glucose was still 31 when patient arrived to the ED. Per daughter, a usedinsulin syringewas noted in the trash and patient was reported to have had only one sandwich last night.  ED Course: In the emergency department, he was febrile, tachycardic, and intermittently tachypneic. Several rounds of IV D50 were given due to hypoglycemia and patient was started on IV D10W. Teleneurology was consulted by ED physician, and CT of head without contrast done showed no acute intracranial process. CT angiography of head and neck showed no acute large or medium vessel occlusion. Patient was intubated due to inability to protect airway. Chest x-ray done showed hazy bilateral airspace opacities  which may represent multifocal pneumonia in the appropriate clinical setting. Patient was empirically started on IV antibiotics (vancomycin, doxycycline and cefepime). [facility] was consulted, [facility] and ABG by [facility] with plan to be seen here in the morning by PCCM. Hospitalist was asked to admit patient for further evaluation management.  -Patient has been terminally extubated and is now on comfort care.   Assessment & Plan:   Active Problems:   Essential hypertension   Pancreatic cancer metastasized to liver (HCC)   Port-A-Cath in place   Acute metabolic encephalopathy   Hypoglycemia   Multifocal pneumonia   Hypoalbuminemia   Lactic acidosis   Elevated liver enzymes   Prolonged QT interval   Sepsis (HCC)   Palliative care by specialist   1-acute metabolic encephalopathy in the setting of hypoglycemia-persistent   2-multifocalaspirationpneumonia -Hold further antibiotic treatments -That is post terminal extubation on 12/24  3-severe sepsis: POA   4-prolonged QT  5-metastatic pancreatic cancer -Currently on comfort care status post terminal extubation -Continue morphine infusion for comfort and anticipate hospital death   -Transfer to MedSurg floor and continue on comfort measures with anticipated hospital death.  DVT prophylaxis:Lovenox Code Status:DNR Family Communication:daughter at bedside12/25 Disposition:  Status is: Inpatient  Dispo: The patient is from: home Anticipated d/c is to:Anticipated hospital death Anticipated d/c date WJ:[id number]:Anticipate in-hospital death Patient currently no medically ready for discharge.   Consultants:  PCCM  Palliative care   Procedures: See below for x-ray report.   Antimicrobials:  Cefepime-DC 12/24 Doxycycline-DC 12/24   Subjective: Patient seen and  evaluated today and appears comfortable and unresponsive on morphine  drip.  He was started on 4 L nasal cannula overnight due to low oxygen saturations.  Objective: Vitals:   09/18/20 2047 09/20/20 0000 09/20/20 0500 09/20/20 1200  BP: 111/77 93/67 95/67    Pulse: 95 (!) 117 (!) 111 (!) 110  Resp: (!) 8 11  20   Temp: 97.6 F (36.4 C)  98.6 F (37 C)   TempSrc: Oral     SpO2: 92% 100% (!) 88%   Weight:      Height:        Intake/Output Summary (Last 24 hours) at 09/20/2020 1428 Last data filed at 09/20/2020 0924 Gross per 24 hour  Intake 3 ml  Output 950 ml  Net -947 ml   Filed Weights   09/15/20 0452 09/16/20 0500 09/17/20 0500  Weight: 66.1 kg 64.9 kg 64.9 kg    Examination:  General exam: Appears calm and comfortable, unresponsive Respiratory system: Clear to auscultation. Respiratory effort normal.  Currently on nasal cannula oxygen Cardiovascular system: S1 & S2 heard, RRR.  Gastrointestinal system: Abdomen is soft Central nervous system: Unresponsive Extremities: No edema Skin: No significant lesions noted Psychiatry: Cannot be assessed    Data Reviewed: I have personally reviewed following labs and imaging studies  CBC: Recent Labs  Lab 09/16/20 0448  WBC 8.1  HGB 11.4*  HCT 35.1*  MCV 93.9  PLT 0000000   Basic Metabolic Panel: Recent Labs  Lab 09/15/20 0429 09/16/20 0448  NA  --  134*  K  --  3.9  CL  --  107  CO2  --  19*  GLUCOSE  --  301*  BUN  --  16  CREATININE 0.49* 0.54*  CALCIUM  --  8.1*  MG  --  1.8  PHOS  --  2.8   GFR: Estimated Creatinine Clearance: 81.1 mL/min (A) (by C-G formula based on SCr of 0.54 mg/dL (L)). Liver Function Tests: No results for input(s): AST, ALT, ALKPHOS, BILITOT, PROT, ALBUMIN in the last 168 hours. No results for input(s): LIPASE, AMYLASE in the last 168 hours. No results for input(s): AMMONIA in the last 168 hours. Coagulation Profile: No results for input(s): INR, PROTIME in the last 168  hours. Cardiac Enzymes: No results for input(s): CKTOTAL, CKMB, CKMBINDEX, TROPONINI in the last 168 hours. BNP (last 3 results) No results for input(s): PROBNP in the last 8760 hours. HbA1C: No results for input(s): HGBA1C in the last 72 hours. CBG: Recent Labs  Lab 09/17/20 0020 09/17/20 0440 09/17/20 0826 09/17/20 0907 09/19/20 0118  GLUCAP 154* 73 66* 126* 99   Lipid Profile: No results for input(s): CHOL, HDL, LDLCALC, TRIG, CHOLHDL, LDLDIRECT in the last 72 hours. Thyroid Function Tests: No results for input(s): TSH, T4TOTAL, FREET4, T3FREE, THYROIDAB in the last 72 hours. Anemia Panel: No results for input(s): VITAMINB12, FOLATE, FERRITIN, TIBC, IRON, RETICCTPCT in the last 72 hours. Sepsis Labs: No results for input(s): PROCALCITON, LATICACIDVEN in the last 168 hours.  Recent Results (from the past 240 hour(s))  Urine culture     Status: None   Collection Time: 09/15/2020  3:20 PM   Specimen: In/Out Cath Urine  Result Value Ref Range Status   Specimen Description   Final    IN/OUT CATH URINE Performed at [facility], [address] [place], [place] [address]    Special Requests   Final    NONE Performed at [facility], [address], [place], [place] [address]    Culture   Final    NO GROWTH  Performed at [facility], [address] [place], [place] [address]    Report Status 09/13/2020 FINAL  Final  Resp Panel by RT-PCR (Flu A&B, Covid) Nasopharyngeal Swab     Status: None   Collection Time: 09/23/2020  3:27 PM   Specimen: Nasopharyngeal Swab; Nasopharyngeal(NP) swabs in vial transport medium  Result Value Ref Range Status   SARS Coronavirus 2 by RT PCR NEGATIVE NEGATIVE Final    Comment: (NOTE) SARS-CoV-2 target nucleic acids are NOT DETECTED.  The SARS-CoV-2 RNA is generally detectable in upper respiratory specimens during the acute phase of infection. The lowest concentration of SARS-CoV-2 viral copies this assay can detect is 138 copies/mL. A  negative result does not preclude SARS-Cov-2 infection and should not be used as the sole basis for treatment or other patient management decisions. A negative result may occur with  improper specimen collection/handling, submission of specimen other than nasopharyngeal swab, presence of viral mutation(s) within the areas targeted by this assay, and inadequate number of viral copies(<138 copies/mL). A negative result must be combined with clinical observations, patient history, and epidemiological information. The expected result is Negative.  Fact Sheet for Patients:  BloggerCourse.com  Fact Sheet for Healthcare Providers:  SeriousBroker.it  This test is no t yet approved or cleared by the [place] FDA and  has been authorized for detection and/or diagnosis of SARS-CoV-2 by FDA under an Emergency Use Authorization (EUA). This EUA will remain  in effect (meaning this test can be used) for the duration of the COVID-19 declaration under Section 564(b)(1) of the Act, 21 U.S.C.section 360bbb-3(b)(1), unless the authorization is terminated  or revoked sooner.       Influenza A by PCR NEGATIVE NEGATIVE Final   Influenza B by PCR NEGATIVE NEGATIVE Final    Comment: (NOTE) The Xpert Xpress SARS-CoV-2/FLU/RSV plus assay is intended as an aid in the diagnosis of influenza from Nasopharyngeal swab specimens and should not be used as a sole basis for treatment. Nasal washings and aspirates are unacceptable for Xpert Xpress SARS-CoV-2/FLU/RSV testing.  Fact Sheet for Patients: BloggerCourse.com  Fact Sheet for Healthcare Providers: SeriousBroker.it  This test is not yet approved or cleared by the [place] FDA and has been authorized for detection and/or diagnosis of SARS-CoV-2 by FDA under an Emergency Use Authorization (EUA). This EUA will remain in effect (meaning this test can  be used) for the duration of the COVID-19 declaration under Section 564(b)(1) of the Act, 21 U.S.C. section 360bbb-3(b)(1), unless the authorization is terminated or revoked.  Performed at [facility], [address], [place], [place] [address]   Blood Culture (routine x 2)     Status: None   Collection Time: 09/08/2020  3:29 PM   Specimen: BLOOD  Result Value Ref Range Status   Specimen Description BLOOD PORTA CATH  Final   Special Requests   Final    BOTTLES DRAWN AEROBIC AND ANAEROBIC Blood Culture adequate volume   Culture   Final    NO GROWTH 5 DAYS Performed at [facility], [address] [place], [place] [address]    Report Status 09/17/2020 FINAL  Final  Blood Culture (routine x 2)     Status: None   Collection Time: 08/29/2020  3:38 PM   Specimen: BLOOD  Result Value Ref Range Status   Specimen Description BLOOD BLOOD RIGHT WRIST  Final   Special Requests   Final    BOTTLES DRAWN AEROBIC AND ANAEROBIC Blood Culture results may not be optimal  due to an inadequate volume of blood received in culture bottles   Culture   Final    NO GROWTH 5 DAYS Performed at [facility], [address], [place], [place] [address]    Report Status 09/17/2020 FINAL  Final  MRSA PCR Screening     Status: None   Collection Time: 09/03/2020  3:59 PM   Specimen: Nasopharyngeal  Result Value Ref Range Status   MRSA by PCR NEGATIVE NEGATIVE Final    Comment:        The GeneXpert MRSA Assay (FDA approved for NASAL specimens only), is one component of a comprehensive MRSA colonization surveillance program. It is not intended to diagnose MRSA infection nor to guide or monitor treatment for MRSA infections. Performed at [facility], [address] [place], [place] [address]   Culture, blood (routine x 2)     Status: None   Collection Time: 09/13/20 12:51 AM   Specimen: BLOOD  Result Value Ref Range Status   Specimen Description BLOOD RIGHT ANTECUBITAL  Final   Special Requests   Final     BOTTLES DRAWN AEROBIC AND ANAEROBIC Blood Culture adequate volume   Culture   Final    NO GROWTH 5 DAYS Performed at [facility], [address], [place], [place] [address]    Report Status 09/18/2020 FINAL  Final  Culture, blood (routine x 2)     Status: None   Collection Time: 09/13/20 12:51 AM   Specimen: BLOOD LEFT HAND  Result Value Ref Range Status   Specimen Description BLOOD LEFT HAND  Final   Special Requests   Final    BOTTLES DRAWN AEROBIC AND ANAEROBIC Blood Culture adequate volume   Culture   Final    NO GROWTH 5 DAYS Performed at [facility], [address] [place], [place] [address]    Report Status 09/18/2020 FINAL  Final         Radiology Studies: No results found.      Scheduled Meds: . Chlorhexidine Gluconate Cloth  6 each Topical Daily  . glycopyrrolate  0.2 mg Intravenous Q4H  . sodium chloride flush  3 mL Intravenous Q12H   Continuous Infusions: . sodium chloride    . morphine 1 mg/hr (09/20/20 0359)     LOS: 8 days    Time spent: 35 minutes    Julian Askin D Manuella Ghazi, DO [facility]  If 7PM-7AM, please contact night-coverage [email or web address] 09/20/2020, 2:28 PM

## 2020-09-20 NOTE — Progress Notes (Addendum)
Palliative: [name] is resting quietly in bed.  He is now full comfort care, and appears comfortable.  He does not respond in any meaningful way to touch.  Family is at bedside.  Family states that they do feel [name] is comfortable.  No needs at this time.  Desaturation is noted.  Conference with attending, bedside nursing staff, transition of care team related to patient condition, needs, goals of care, comfort care.  Addendum: Return to [name]'s room later in the afternoon.  He continues to appear comfortable. Cartoons were playing loudly on the television, volume lowered for comfort.  Another daughter is at bedside, therefore I do not discuss transfer to residential hospice.  We talked about the use of oxygen, prolonging the dying process, oxygen removed.  Daughter states that she feels that [name] is comfortable, morphine 1 mg bolus IV given.  Attending updated.  Plan: Full comfort care, morphine continuous infusion for comfort Prognosis: Hours to days, anticipate in hospital death.  35 minutes Lillia Carmel, NP Palliative medicine team Team phone [phone] Greater than 50% of this time was spent counseling and coordinating care related to the above assessment and plan.

## 2020-09-20 NOTE — Progress Notes (Signed)
Both daughters at bedside most of the day. Both daughters very upset after palliative care nurse practitioner in to see pt. Had NP call pt's oldest daughter [name] to discuss care. Daughter cursing NP over phone, states, "Go kill your own parents but you ain't killing mine! He ain't in no distress and the doctor said we would leave the morphine at 1! Who are you to come in here and change stuff? You aint no doctor. I know morphine kills people!"  Once daughter off phone with NP, this nurse spoke with daughter to review s/s of distress so that everyone is on the same page for criteria to increase morphine drip if needed. Daughter states, "I'll know if he's in distress and I'll let you know if he gets worse." Daughter [name] is able to give verbal return of s/s distress. She is much calmer now than earlier.  Pt remains unresponsive, occasionally opens eyes briefly but not to any particular stimuli and does not make eye contact. Resp 20/min, deeper than before but not labored. HR 130 apically. No mottling noted at this time, cap refill 3 sec.

## 2020-09-21 ENCOUNTER — Ambulatory Visit (HOSPITAL_COMMUNITY): Payer: Medicare Other

## 2020-09-21 ENCOUNTER — Other Ambulatory Visit (HOSPITAL_COMMUNITY): Payer: Medicare Other

## 2020-09-21 ENCOUNTER — Ambulatory Visit (HOSPITAL_COMMUNITY): Payer: Medicare Other | Admitting: Hematology

## 2020-09-21 DIAGNOSIS — Z515 Encounter for palliative care: Secondary | ICD-10-CM | POA: Diagnosis not present

## 2020-09-21 DIAGNOSIS — A419 Sepsis, unspecified organism: Secondary | ICD-10-CM | POA: Diagnosis not present

## 2020-09-21 DIAGNOSIS — C259 Malignant neoplasm of pancreas, unspecified: Secondary | ICD-10-CM | POA: Diagnosis not present

## 2020-09-21 DIAGNOSIS — G9341 Metabolic encephalopathy: Secondary | ICD-10-CM | POA: Diagnosis not present

## 2020-09-21 DIAGNOSIS — J9600 Acute respiratory failure, unspecified whether with hypoxia or hypercapnia: Secondary | ICD-10-CM | POA: Diagnosis not present

## 2020-09-21 MED ORDER — ACETAMINOPHEN 650 MG RE SUPP
650.0000 mg | RECTAL | Status: DC | PRN
  Administered 2020-09-21 – 2020-09-25 (×2): 650 mg via RECTAL
  Filled 2020-09-21 (×2): qty 1

## 2020-09-21 NOTE — Progress Notes (Signed)
Palliative: Mr. Waddell remains on comfort measures only. Conference with bedside nursing staff and attending related to patient condition, needs. Per staff Mr. Winegar remains comfortable, and although he is having an increased respiratory rate, his daughters who are at bedside declined any increase medications for comfort. Mr. Beichner is now on room air.  Conference with chaplain service and patient experience liaison to assist this grieving family.  Plan: Continue full comfort care. Prognosis: Hours, anticipate in hospital death.  25 minutes Lillia Carmel, NP Palliative medicine team Team phone [phone] Greater than 50% of this time was spent counseling and coordinating care related to the above assessment and plan.

## 2020-09-21 NOTE — Progress Notes (Signed)
PROGRESS NOTE    John Bethley Sr.  [id number] DOB: 1950/11/29 DOA: 09/21/2020 PCP: Perlie Mayo, NP   Brief Narrative:  As per H&P written by Dr. 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 Theodoro Kalata presents to the emergency department via EMS due to being unresponsive at home. Patient was recently admitted on 12/15 and discharged on 12/18 due to DKA. Patient was unable to provide history due to being intubated and sedated, history was obtained fromdaughter at bedside,ED physician and ED medical record. Per report, patient went to use the restroom around4 AM this morning,around 10 AM, he told the son that he was going back to bed. Daughter at bedside called from work this afternoon to check on patient, and patient's wife tried to wake him up at2 PM today but hewaslethargic andunresponsive, EMS was activated, on arrival of EMS team, blood glucose was noted to be in the 30s, 1 amp of D50 was given in the field, but blood glucose was still 31 when patient arrived to the ED. Per daughter, a usedinsulin syringewas noted in the trash and patient was reported to have had only one sandwich last night.  ED Course: In the emergency department, he was febrile, tachycardic, and intermittently tachypneic. Several rounds of IV D50 were given due to hypoglycemia and patient was started on IV D10W. Teleneurology was consulted by ED physician, and CT of head without contrast done showed no acute intracranial process. CT angiography of head and neck showed no acute large or medium vessel occlusion. Patient was intubated due to inability to protect airway. Chest x-ray done showed hazy bilateral airspace opacities  which may represent multifocal pneumonia in the appropriate clinical setting. Patient was empirically started on IV antibiotics (vancomycin, doxycycline and cefepime). PCCM was consulted, no beds in [facility] and [facility],it was recommended for patient to be [facility] follow-up of labs and ABG by Elinkand with plan to be seen here in the morning by PCCM. Hospitalist was asked to admit patient for further evaluation management.  -Patient has been terminally extubated and is now on comfort care.   Assessment & Plan:   Active Problems:   Essential hypertension   Pancreatic cancer metastasized to liver (HCC)   Port-A-Cath in place   Acute metabolic encephalopathy   Hypoglycemia   Multifocal pneumonia   Hypoalbuminemia   Lactic acidosis   Elevated liver enzymes   Prolonged QT interval   Sepsis ([place])   Palliative care by specialist   1-acute metabolic encephalopathy in the setting of hypoglycemia-persistent   2-multifocalaspirationpneumonia -Hold further antibiotic treatments -That is post terminal extubation on 12/24  3-severe sepsis: POA   4-prolonged QT  5-metastatic pancreatic cancer -Currently on comfort care status post terminal extubation -Continue morphine infusion for comfort and anticipate hospital death   -Transfer to [place] floor and continue on comfort measures with anticipated hospital death.  DVT prophylaxis:Lovenox Code Status:DNR Family Communication:daughter at bedside12/28 Disposition:  Status is: Inpatient  Dispo: The patient is from: home Anticipated d/c is to:Anticipated hospital death Anticipated d/c date [id number] in-hospital death Patient currently no medically ready for discharge.   Consultants:  PCCM  Palliative care   Procedures: See below for x-ray report.   Antimicrobials:  Cefepime-DC 12/24 Doxycycline-DC 12/24   Subjective: Patient seen and  evaluated today and appears comfortable with no acute overnight  events noted.  He is currently on morphine drip.  Objective: Vitals:   09/20/20 0000 09/20/20 0500 09/20/20 1200 09/20/20 2044  BP: 93/67 95/67  110/77  Pulse: (!) 117 (!) 111 (!) 110 (!) 114  Resp: 11  20 20   Temp:  98.6 F (37 C)  98.6 F (37 C)  TempSrc:    Oral  SpO2: 100% (!) 88%  (!) 75%  Weight:      Height:        Intake/Output Summary (Last 24 hours) at 09/21/2020 1536 Last data filed at 09/21/2020 1022 Gross per 24 hour  Intake 3 ml  Output 700 ml  Net -697 ml   Filed Weights   09/15/20 0452 09/16/20 0500 09/17/20 0500  Weight: 66.1 kg 64.9 kg 64.9 kg    Examination:  General exam: Appears calm and comfortable, unresponsive Respiratory system: Clear to auscultation. Respiratory effort normal. Cardiovascular system: S1 & S2 heard, RRR.  Gastrointestinal system: Abdomen is soft Central nervous system: Unresponsive Extremities: No edema Skin: No significant lesions noted Psychiatry: Cannot be assessed    Data Reviewed: I have personally reviewed following labs and imaging studies  CBC: Recent Labs  Lab 09/16/20 0448  WBC 8.1  HGB 11.4*  HCT 35.1*  MCV 93.9  PLT 0000000   Basic Metabolic Panel: Recent Labs  Lab 09/15/20 0429 09/16/20 0448  NA  --  134*  K  --  3.9  CL  --  107  CO2  --  19*  GLUCOSE  --  301*  BUN  --  16  CREATININE 0.49* 0.54*  CALCIUM  --  8.1*  MG  --  1.8  PHOS  --  2.8   GFR: Estimated Creatinine Clearance: 81.1 mL/min (A) (by C-G formula based on SCr of 0.54 mg/dL (L)). Liver Function Tests: No results for input(s): AST, ALT, ALKPHOS, BILITOT, PROT, ALBUMIN in the last 168 hours. No results for input(s): LIPASE, AMYLASE in the last 168 hours. No results for input(s): AMMONIA in the last 168 hours. Coagulation Profile: No results for input(s): INR, PROTIME in the last 168 hours. Cardiac Enzymes: No results for input(s): CKTOTAL, CKMB, CKMBINDEX,  TROPONINI in the last 168 hours. BNP (last 3 results) No results for input(s): PROBNP in the last 8760 hours. HbA1C: No results for input(s): HGBA1C in the last 72 hours. CBG: Recent Labs  Lab 09/17/20 0020 09/17/20 0440 09/17/20 0826 09/17/20 0907 09/19/20 0118  GLUCAP 154* 73 66* 126* 99   Lipid Profile: No results for input(s): CHOL, HDL, LDLCALC, TRIG, CHOLHDL, LDLDIRECT in the last 72 hours. Thyroid Function Tests: No results for input(s): TSH, T4TOTAL, FREET4, T3FREE, THYROIDAB in the last 72 hours. Anemia Panel: No results for input(s): VITAMINB12, FOLATE, FERRITIN, TIBC, IRON, RETICCTPCT in the last 72 hours. Sepsis Labs: No results for input(s): PROCALCITON, LATICACIDVEN in the last 168 hours.  Recent Results (from the past 240 hour(s))  Urine culture     Status: None   Collection Time: 08/25/2020  3:20 PM   Specimen: In/Out Cath Urine  Result Value Ref Range Status   Specimen Description   Final    IN/OUT CATH URINE Performed at [facility], [address], [place], [place] [address]    Special Requests   Final    NONE Performed at [facility], [address] [place], [place] [address]    Culture   Final    NO GROWTH Performed at [facility], [place] [address], [place], [place]  [address]    Report Status 09/13/2020 FINAL  Final  Resp Panel by RT-PCR (Flu A&B, Covid) Nasopharyngeal Swab     Status: None   Collection Time: 09/07/2020  3:27 PM   Specimen: Nasopharyngeal Swab; Nasopharyngeal(NP) swabs in vial transport medium  Result Value Ref Range Status   SARS Coronavirus 2 by RT PCR NEGATIVE NEGATIVE Final    Comment: (NOTE) SARS-CoV-2 target nucleic acids are NOT DETECTED.  The SARS-CoV-2 RNA is generally detectable in upper respiratory specimens during the acute phase of infection. The lowest concentration of SARS-CoV-2 viral copies this assay can detect is 138 copies/mL. A negative result does not preclude SARS-Cov-2 infection and should not be used  as the sole basis for treatment or other patient management decisions. A negative result may occur with  improper specimen collection/handling, submission of specimen other than nasopharyngeal swab, presence of viral mutation(s) within the areas targeted by this assay, and inadequate number of viral copies(<138 copies/mL). A negative result must be combined with clinical observations, patient history, and epidemiological information. The expected result is Negative.  Fact Sheet for Patients:  BloggerCourse.com  Fact Sheet for Healthcare Providers:  SeriousBroker.it  This test is no t yet approved or cleared by the [place] FDA and  has been authorized for detection and/or diagnosis of SARS-CoV-2 by FDA under an Emergency Use Authorization (EUA). This EUA will remain  in effect (meaning this test can be used) for the duration of the COVID-19 declaration under Section 564(b)(1) of the Act, 21 U.S.C.section 360bbb-3(b)(1), unless the authorization is terminated  or revoked sooner.       Influenza A by PCR NEGATIVE NEGATIVE Final   Influenza B by PCR NEGATIVE NEGATIVE Final    Comment: (NOTE) The Xpert Xpress SARS-CoV-2/FLU/RSV plus assay is intended as an aid in the diagnosis of influenza from Nasopharyngeal swab specimens and should not be used as a sole basis for treatment. Nasal washings and aspirates are unacceptable for Xpert Xpress SARS-CoV-2/FLU/RSV testing.  Fact Sheet for Patients: BloggerCourse.com  Fact Sheet for Healthcare Providers: SeriousBroker.it  This test is not yet approved or cleared by the [place] FDA and has been authorized for detection and/or diagnosis of SARS-CoV-2 by FDA under an Emergency Use Authorization (EUA). This EUA will remain in effect (meaning this test can be used) for the duration of the COVID-19 declaration under Section 564(b)(1)  of the Act, 21 U.S.C. section 360bbb-3(b)(1), unless the authorization is terminated or revoked.  Performed at [facility], [address] [place], [place] [address]   Blood Culture (routine x 2)     Status: None   Collection Time: 09/03/2020  3:29 PM   Specimen: BLOOD  Result Value Ref Range Status   Specimen Description BLOOD PORTA CATH  Final   Special Requests   Final    BOTTLES DRAWN AEROBIC AND ANAEROBIC Blood Culture adequate volume   Culture   Final    NO GROWTH 5 DAYS Performed at [facility], [address], [place], [place] [address]    Report Status 09/17/2020 FINAL  Final  Blood Culture (routine x 2)     Status: None   Collection Time: 09/07/2020  3:38 PM   Specimen: BLOOD  Result Value Ref Range Status   Specimen Description BLOOD BLOOD RIGHT WRIST  Final   Special Requests   Final    BOTTLES DRAWN AEROBIC AND ANAEROBIC Blood Culture results may not be optimal due to an inadequate volume of blood received in culture bottles  Culture   Final    NO GROWTH 5 DAYS Performed at [facility], [address] [place], [place] [address]    Report Status 09/17/2020 FINAL  Final  MRSA PCR Screening     Status: None   Collection Time: 09/11/2020  3:59 PM   Specimen: Nasopharyngeal  Result Value Ref Range Status   MRSA by PCR NEGATIVE NEGATIVE Final    Comment:        The GeneXpert MRSA Assay (FDA approved for NASAL specimens only), is one component of a comprehensive MRSA colonization surveillance program. It is not intended to diagnose MRSA infection nor to guide or monitor treatment for MRSA infections. Performed at [facility], [address] [place], [place] [address]   Culture, blood (routine x 2)     Status: None   Collection Time: 09/13/20 12:51 AM   Specimen: BLOOD  Result Value Ref Range Status   Specimen Description BLOOD RIGHT ANTECUBITAL  Final   Special Requests   Final    BOTTLES DRAWN AEROBIC AND ANAEROBIC Blood Culture adequate volume   Culture    Final    NO GROWTH 5 DAYS Performed at [facility], [address], [place], [place] [address]    Report Status 09/18/2020 FINAL  Final  Culture, blood (routine x 2)     Status: None   Collection Time: 09/13/20 12:51 AM   Specimen: BLOOD LEFT HAND  Result Value Ref Range Status   Specimen Description BLOOD LEFT HAND  Final   Special Requests   Final    BOTTLES DRAWN AEROBIC AND ANAEROBIC Blood Culture adequate volume   Culture   Final    NO GROWTH 5 DAYS Performed at [facility], [address], [place],  [address]    Report Status 09/18/2020 FINAL  Final         Radiology Studies: No results found.      Scheduled Meds: . Chlorhexidine Gluconate Cloth  6 each Topical Daily  . glycopyrrolate  0.2 mg Intravenous Q4H  . sodium chloride flush  3 mL Intravenous Q12H   Continuous Infusions: . sodium chloride    . morphine 1 mg/hr (09/20/20 0359)     LOS: 9 days    Time spent: 35 minutes    Jamario Colina D Manuella Ghazi, DO [facility]  If 7PM-7AM, please contact night-coverage [email or web address] 09/21/2020, 3:36 PM

## 2020-09-22 DIAGNOSIS — C787 Secondary malignant neoplasm of liver and intrahepatic bile duct: Secondary | ICD-10-CM | POA: Diagnosis not present

## 2020-09-22 DIAGNOSIS — C259 Malignant neoplasm of pancreas, unspecified: Secondary | ICD-10-CM | POA: Diagnosis not present

## 2020-09-22 DIAGNOSIS — G9341 Metabolic encephalopathy: Secondary | ICD-10-CM | POA: Diagnosis not present

## 2020-09-22 DIAGNOSIS — Z515 Encounter for palliative care: Secondary | ICD-10-CM | POA: Diagnosis not present

## 2020-09-22 DIAGNOSIS — A419 Sepsis, unspecified organism: Secondary | ICD-10-CM | POA: Diagnosis not present

## 2020-09-22 MED ORDER — MORPHINE SULFATE (PF) 2 MG/ML IV SOLN
1.0000 mg | INTRAVENOUS | Status: DC | PRN
  Administered 2020-09-22: 2 mg via INTRAVENOUS
  Filled 2020-09-22: qty 1

## 2020-09-22 NOTE — Progress Notes (Signed)
Palliative: Mr. Overholser is resting quietly in bed.  He remains on comfort measures only, and appears comfortable.  He does not respond in any meaningful way to voice or touch.  His daughter, Mat Carne, is at bedside.  Joint conference with chaplain Delford Field.  Mat Carne shares that she feels that Mr. Trentman is being sedated, and she would like a trial off morphine.  We talked about signs and symptoms of discomfort, encouraging client to reach out to nursing if she sees any of these.  Call to wife, Jamond Neels.  She states that she has been in contact with their daughter Mat Carne, and is aware of the treatment plan.  She tells me that she is in agreement to pause/stop the morphine continuous infusion, and manage symptoms with as needed morphine.  Mr. Wenzler is still married to Rella Larve but she and daughter, Kobee Medlen, make choices as a team.  Georgeann Oppenheim tells me clearly that she prefers that [place] make choices.  Conference with attending and bedside nursing staff related to patient condition, needs, goals of care, stopping of continuous infusion of morphine.  Plan:   Full comfort care, anticipate hospital death Prognosis: Hours to days.  Prognostication can sometimes be difficult.  Mr. Sheahan was not expected to survive this long after compassionate extubation.  It does seem that he has hours to days left, there are no adjuvant therapies (such as oxygen) that are prolonging life.  It would seem unwise to transition to residential hospice at this point.  40 minutes  Lillia Carmel, NP Palliative medicine team Team phone [phone] Greater than 50% of this time was spent counseling and coordinating care related to the above assessment and plan.

## 2020-09-22 NOTE — Progress Notes (Signed)
PROGRESS NOTE    John Sroka Sr.  GGY:[id number] DOB: 08/06/1951 DOA: 2020-09-22 PCP: Freddy Finner, NP   Brief Narrative:  As per H&P written by Dr. 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 [place] Pao presents to the emergency department via EMS due to being unresponsive at home. Patient was recently admitted on 12/15 and discharged on 12/18 due to DKA. Patient was unable to provide history due to being intubated and sedated, history was obtained fromdaughter at bedside,ED physician and ED medical record. Per report, patient went to use the restroom around4 AM this morning,around 10 AM, he told the son that he was going back to bed. Daughter at bedside called from work this afternoon to check on patient, and patient's wife tried to wake him up at2 PM today but hewaslethargic andunresponsive, EMS was activated, on arrival of EMS team, blood glucose was noted to be in the 30s, 1 amp of D50 was given in the field, but blood glucose was still 31 when patient arrived to the ED. Per daughter, a usedinsulin syringewas noted in the trash and patient was reported to have had only one sandwich last night.  ED Course: In the emergency department, he was febrile, tachycardic, and intermittently tachypneic. Several rounds of IV D50 were given due to hypoglycemia and patient was started on IV D10W. Teleneurology was consulted by ED physician, and CT of head without contrast done showed no acute intracranial process. CT angiography of head and neck showed no acute large or medium vessel occlusion. Patient was intubated due to inability to protect airway. Chest x-ray done showed hazy bilateral airspace opacities  which may represent multifocal pneumonia in the appropriate clinical setting. Patient was empirically started on IV antibiotics (vancomycin, doxycycline and cefepime). PCCM was consulted, no beds in [facility],it was recommended for patient to be admitted here at[facility]with follow-up of labs and ABG by El[facility]and with plan to be seen here in the morning by PCCM. Hospitalist was asked to admit patient for further evaluation management.  -Patient has been terminally extubated and is now on comfort care.   Assessment & Plan:   Active Problems:   Essential hypertension   Pancreatic cancer metastasized to liver (HCC)   Port-A-Cath in place   Acute metabolic encephalopathy   Hypoglycemia   Multifocal pneumonia   Hypoalbuminemia   Lactic acidosis   Elevated liver enzymes   Prolonged QT interval   Sepsis (HCC)   Palliative care by specialist   1-acute metabolic encephalopathy in the setting of hypoglycemia-persistent   2-multifocalaspirationpneumonia -Hold further antibiotic treatments -That is post terminal extubation on 12/24  3-severe sepsis: POA   4-prolonged QT  5-metastatic pancreatic cancer -Currently on comfort care status post terminal extubation -Continue for comfort measures in accordance with family's understanding of the patient's wishes     DVT prophylaxis:N/A, comfort measures Code Status:DNR Family Communication: Daughter at bedside12/29 Disposition:  Status is: Inpatient  Dispo: The patient is from: home Anticipated d/c is [id number] hospice Anticipated d/c date XH:[id number] bed available Patient currently medically ready for discharge.   Consultants:  PCCM  Palliative care     Antimicrobials:  Cefepime-DC 12/24 Doxycycline-DC 12/24   Subjective: Patient has been resting comfortably, no new fever, no events overnight, no respiratory distress, no obvious pain.  Objective: Vitals:    09/21/20 2116 09/22/20  1100 09/22/20 1318 09/22/20 1631  BP: 123/83   (!) 151/114  Pulse: 100   (!) 121  Resp: 18 13 16 20   Temp: 97.9 F (36.6 C)     TempSrc: Axillary     SpO2: (!) 88%   94%  Weight:      Height:        Intake/Output Summary (Last 24 hours) at 09/22/2020 1706 Last data filed at 09/22/2020 0603 Gross per 24 hour  Intake --  Output 100 ml  Net -100 ml   Filed Weights   09/15/20 0452 09/16/20 0500 09/17/20 0500  Weight: 66.1 kg 64.9 kg 64.9 kg    Examination:  General exam: Appears somnolent, does not respond to touch or voice.  No spontaneous movements or verbalizations.  He has obvious temporal wasting, loss of subcutaneous muscle mass and fat. Respiratory system: Normal respiratory effort, no rales or wheezing Cardiovascular system: Regular rate and rhythm, normal S1-S2, no murmurs, no peripheral edema Gastrointestinal system: Soft, no tenderness or grimace to palpation, no ascites or distention Central nervous system: Unresponsive to voice or touch, does not respond to pain, no spontaneous verbalizations or movements Extremities: Left BKA Skin: Normal Psychiatry: Vegetative   Data Reviewed: I have personally reviewed following labs and imaging studies  CBC: Recent Labs  Lab 09/16/20 0448  WBC 8.1  HGB 11.4*  HCT 35.1*  MCV 93.9  PLT 175   Basic Metabolic Panel: Recent Labs  Lab 09/16/20 0448  NA 134*  K 3.9  CL 107  CO2 19*  GLUCOSE 301*  BUN 16  CREATININE 0.54*  CALCIUM 8.1*  MG 1.8  PHOS 2.8   GFR: Estimated Creatinine Clearance: 81.1 mL/min (A) (by C-G formula based on SCr of 0.54 mg/dL (L)). Liver Function Tests: No results for input(s): AST, ALT, ALKPHOS, BILITOT, PROT, ALBUMIN in the last 168 hours. No results for input(s): LIPASE, AMYLASE in the last 168 hours. No results for input(s): AMMONIA in the last 168 hours. Coagulation Profile: No results for input(s): INR, PROTIME in the last 168 hours. Cardiac  Enzymes: No results for input(s): CKTOTAL, CKMB, CKMBINDEX, TROPONINI in the last 168 hours. BNP (last 3 results) No results for input(s): PROBNP in the last 8760 hours. HbA1C: No results for input(s): HGBA1C in the last 72 hours. CBG: Recent Labs  Lab 09/17/20 0020 09/17/20 0440 09/17/20 0826 09/17/20 0907 09/19/20 0118  GLUCAP 154* 73 66* 126* 99   Lipid Profile: No results for input(s): CHOL, HDL, LDLCALC, TRIG, CHOLHDL, LDLDIRECT in the last 72 hours. Thyroid Function Tests: No results for input(s): TSH, T4TOTAL, FREET4, T3FREE, THYROIDAB in the last 72 hours. Anemia Panel: No results for input(s): VITAMINB12, FOLATE, FERRITIN, TIBC, IRON, RETICCTPCT in the last 72 hours. Sepsis Labs: No results for input(s): PROCALCITON, LATICACIDVEN in the last 168 hours.  Recent Results (from the past 240 hour(s))  Culture, blood (routine x 2)     Status: None   Collection Time: 09/13/20 12:51 AM   Specimen: BLOOD  Result Value Ref Range Status   Specimen Description BLOOD RIGHT ANTECUBITAL  Final   Special Requests   Final    BOTTLES DRAWN AEROBIC AND ANAEROBIC Blood Culture adequate volume   Culture   Final    NO GROWTH 5 DAYS Performed at [facility], [address], [place], [place]    Report Status 09/18/2020 FINAL  Final  Culture, blood (routine x 2)     Status: None   Collection Time: 09/13/20 12:51 AM  Specimen: BLOOD LEFT HAND  Result Value Ref Range Status   Specimen Description BLOOD LEFT HAND  Final   Special Requests   Final    BOTTLES DRAWN AEROBIC AND ANAEROBIC Blood Culture adequate volume   Culture   Final    NO GROWTH 5 DAYS Performed at [facility], [address], [place], [place] [address]    Report Status 09/18/2020 FINAL  Final         Radiology Studies: No results found.      Scheduled Meds: . Chlorhexidine Gluconate Cloth  6 each Topical Daily  . glycopyrrolate  0.2 mg Intravenous Q4H  . sodium chloride flush  3 mL  Intravenous Q12H   Continuous Infusions: . sodium chloride    . morphine 1 mg/hr (09/20/20 0359)     LOS: 10 days    Time spent: 25 minutes    Edwin Dada, MD [facility]  If 7PM-7AM, please contact night-coverage [email or web address] 09/22/2020, 5:06 PM

## 2020-09-22 NOTE — Progress Notes (Signed)
Upon entering pts room to give 1800 medication dose I noticed pt was breathing labored and more rapid than before. RR 24, RR previously was 13 when morphine IVF was paused per family request and NP order. Up until this time pt was comfortable and didn't appear to be in any distress. Daughter Mat Carne wasn't present at bedside but other sister was. This nurse explained that increased RR is a sign of distress and I wish to releive some discomfort. Sister Mat Carne was on other sisters cell phone on video chat and responded "lets try the morphine PRN and not the morphine IV". Morphine 2mg  given via IV. Both sisters were aware of given medication and reasoning why. Other sister still present bedside and has no further questions at this time.

## 2020-09-23 ENCOUNTER — Encounter (HOSPITAL_COMMUNITY): Payer: Medicare Other

## 2020-09-23 DIAGNOSIS — G9341 Metabolic encephalopathy: Secondary | ICD-10-CM | POA: Diagnosis not present

## 2020-09-23 NOTE — Progress Notes (Signed)
PROGRESS NOTE    Koa Biderman Sr.  [id number] DOB: [birth year] DOA: 09/14/2020 PCP: Perlie Mayo, NP   Brief Narrative:  [name] is a 69 y.o. M with DM and recent episode of DKA, left BKA, metastatic pancreatic cancer status post palliative chemo who presented after being found unresponsive.  Patient was able to get up and go to the bathroom the morning of admission, went back to bed around 10 AM.  In the afternoon family went to check on him that he was unresponsive and lethargic.  EMS was activated, found his glucose in the 30s.  In the ER patient febrile, tachycardic, tachypneic.  CT head unremarkable, CT angiogram of the neck unremarkable.  Chest x-ray showed bilateral base opacities.  Patient was started on IV antibiotics, intubated, and admitted to the ICU.          Assessment & Plan:   Acute metabolic encephalopathy due to sepsis and hypoglycemia Persistent encephalopathy  Severe sepsis due to multifocal pneumonia   Metastatic pancreatic cancer Severe protein calorie malnutrition As evidenced by weight loss of greater than 10 kg in the last 6 months, chronic illness, and poor oral take   Patient was admitted, started on broad-spectrum antibiotics and intubated.  He had no meaningful neurological recovery with weaning of sedation, and family elected to terminally extubate, as the patient would not have wished to be dependent on life support for prolonged period of time, and he has no meaningful medical expectation of recovery to his prior level of function at this point.  -Full comfort measures -Continue IV morphine during the night, titrate during the day     DVT prophylaxis:N/A, comfort measures Code Status:DNR Family Communication: Daughter clay at bedside12/30 Disposition:  Status is: Inpatient  Dispo: The patient is from: home Anticipated d/c is [id number] hospice Anticipated d/c date [id number] bed  available Patient currently medically ready for discharge.     Subjective: Yesterday evening when family left, the patient appeared to have some uncomfortable respirations.  However overnight he was comfortable, he has had no respiratory distress or fever today.  No new events.  Objective: Vitals:   09/22/20 1719 09/22/20 2226 09/23/20 1000 09/23/20 1400  BP:  (!) 166/84  131/89  Pulse:  (!) 124  (!) 107  Resp: (!) 24 (!) 28 20 18   Temp:  98.2 F (36.8 C)  98.7 F (37.1 C)  TempSrc:  Axillary  Oral  SpO2:  96%  90%  Weight:      Height:        Intake/Output Summary (Last 24 hours) at 09/23/2020 1730 Last data filed at 09/23/2020 1030 Gross per 24 hour  Intake 0 ml  Output 950 ml  Net -950 ml   Filed Weights   09/15/20 0452 09/16/20 0500 09/17/20 0500  Weight: 66.1 kg 64.9 kg 64.9 kg    Examination:  General exam: Still appears somnolent, does not respond to touch or voice, Respiratory system: Occasional snorting breaths, no rales or wheezing Cardiovascular system: Heart rate normal, no murmurs, encroaching mild peripheral edema Gastrointestinal system: Abdomen soft without grimace to palpation, no ascites or distention Central nervous system: Unresponsive to voice or touch, does not respond to pain, no spontaneous verbalizations or movements Extremities: Left BKA Skin: Normal without rashes or lesions Psychiatry: Vegetative   Data Reviewed: I have personally reviewed following labs and imaging studies  CBC: No results for input(s): WBC, NEUTROABS, HGB, HCT, MCV, PLT in the last 168 hours. Basic Metabolic Panel: No results  for input(s): NA, K, CL, CO2, GLUCOSE, BUN, CREATININE, CALCIUM, MG, PHOS in the last 168 hours. GFR: Estimated Creatinine Clearance: 81.1 mL/min (A) (by C-G formula based on SCr of 0.54 mg/dL (L)). Liver Function Tests: No results for input(s): AST, ALT, ALKPHOS, BILITOT, PROT, ALBUMIN in the last 168 hours. No results for  input(s): LIPASE, AMYLASE in the last 168 hours. No results for input(s): AMMONIA in the last 168 hours. Coagulation Profile: No results for input(s): INR, PROTIME in the last 168 hours. Cardiac Enzymes: No results for input(s): CKTOTAL, CKMB, CKMBINDEX, TROPONINI in the last 168 hours. BNP (last 3 results) No results for input(s): PROBNP in the last 8760 hours. HbA1C: No results for input(s): HGBA1C in the last 72 hours. CBG: Recent Labs  Lab 09/17/20 0020 09/17/20 0440 09/17/20 0826 09/17/20 0907 09/19/20 0118  GLUCAP 154* 73 66* 126* 99   Lipid Profile: No results for input(s): CHOL, HDL, LDLCALC, TRIG, CHOLHDL, LDLDIRECT in the last 72 hours. Thyroid Function Tests: No results for input(s): TSH, T4TOTAL, FREET4, T3FREE, THYROIDAB in the last 72 hours. Anemia Panel: No results for input(s): VITAMINB12, FOLATE, FERRITIN, TIBC, IRON, RETICCTPCT in the last 72 hours. Sepsis Labs: No results for input(s): PROCALCITON, LATICACIDVEN in the last 168 hours.  No results found for this or any previous visit (from the past 240 hour(s)).       Radiology Studies: No results found.      Scheduled Meds: . glycopyrrolate  0.2 mg Intravenous Q4H  . sodium chloride flush  3 mL Intravenous Q12H   Continuous Infusions: . sodium chloride    . morphine 1 mg/hr (09/22/20 2014)     LOS: 11 days    Time spent: 25 minutes    Alberteen Sam, MD [facility]  If 7PM-7AM, please contact night-coverage [email or web address] 09/23/2020, 5:30 PM

## 2020-09-24 DIAGNOSIS — G9341 Metabolic encephalopathy: Secondary | ICD-10-CM | POA: Diagnosis not present

## 2020-09-24 MED ORDER — MORPHINE 100MG IN NS 100ML (1MG/ML) PREMIX INFUSION
1.0000 mg/h | INTRAVENOUS | Status: DC
  Administered 2020-09-24 – 2020-09-26 (×2): 1 mg/h via INTRAVENOUS
  Filled 2020-09-24: qty 100

## 2020-09-24 NOTE — Progress Notes (Signed)
PROGRESS NOTE    John Kinkade Sr.  [id number] DOB: [birth year] DOA: 09/13/2020 PCP: Perlie Mayo, NP   Brief Narrative:  Mr. Staggers is a 69 y.o. M with DM and recent episode of DKA, left BKA, metastatic pancreatic cancer status post palliative chemo who presented after being found unresponsive.  Patient was able to get up and go to the bathroom the morning of admission, went back to bed around 10 AM.  In the afternoon family went to check on him that he was unresponsive and lethargic.  EMS was activated, found his glucose in the 30s.  In the ER patient febrile, tachycardic, tachypneic.  CT head unremarkable, CT angiogram of the neck unremarkable.  Chest x-ray showed bilateral base opacities.  Patient was started on IV antibiotics, intubated, and admitted to the ICU.          Assessment & Plan:   Acute metabolic encephalopathy due to sepsis and hypoglycemia Persistent encephalopathy  Severe sepsis due to multifocal pneumonia   Metastatic pancreatic cancer Severe protein calorie malnutrition As evidenced by weight loss of greater than 10 kg in the last 6 months, chronic illness, and poor oral take   Patient was admitted, started on broad-spectrum antibiotics and intubated.  He had no meaningful neurological recovery with weaning of sedation, and family elected to terminally extubate, as the patient would not have wished to be dependent on life support for prolonged period of time, and he has no meaningful medical expectation of recovery to his prior level of function at this point.  -Full comfort measures -Continue morphine drip at night, and as needed      DVT prophylaxis:N/A, comfort measures Code Status:DNR Family Communication: Daughter clay at bedside12/30 Disposition:  Status is: Inpatient  Dispo: The patient is from: home Anticipated d/c is [id number] hospice Anticipated d/c date [id number] bed  available Patient currently medically ready for discharge.     Subjective: No events overnight.  No fever.  Objective: Vitals:   09/22/20 2226 09/23/20 1000 09/23/20 1400 09/23/20 2041  BP: (!) 166/84  131/89 135/87  Pulse: (!) 124  (!) 107 (!) 112  Resp: (!) 28 20 18 20   Temp: 98.2 F (36.8 C)  98.7 F (37.1 C) 98 F (36.7 C)  TempSrc: Axillary  Oral   SpO2: 96%  90% (!) 85%  Weight:      Height:        Intake/Output Summary (Last 24 hours) at 09/24/2020 1543 Last data filed at 09/24/2020 1257 Gross per 24 hour  Intake 0 ml  Output 450 ml  Net -450 ml   Filed Weights   09/15/20 0452 09/16/20 0500 09/17/20 0500  Weight: 66.1 kg 64.9 kg 64.9 kg    Examination:  General exam: Remains comatose Respiratory system: Occasional snorting breaths, no rales or wheezing Cardiovascular system: Heart rate normal, no murmurs, encroaching mild peripheral edema Gastrointestinal system: Abdomen soft without grimace to palpation, no ascites or distention Central nervous system: Unresponsive to voice or touch, does not respond to pain, no spontaneous verbalizations or movements Extremities: Left BKA Skin: Normal without rashes or lesions Psychiatry: Vegetative   Data Reviewed: I have personally reviewed following labs and imaging studies  CBC: No results for input(s): WBC, NEUTROABS, HGB, HCT, MCV, PLT in the last 168 hours. Basic Metabolic Panel: No results for input(s): NA, K, CL, CO2, GLUCOSE, BUN, CREATININE, CALCIUM, MG, PHOS in the last 168 hours. GFR: Estimated Creatinine Clearance: 81.1 mL/min (A) (by C-G formula based on SCr of  0.54 mg/dL (L)). Liver Function Tests: No results for input(s): AST, ALT, ALKPHOS, BILITOT, PROT, ALBUMIN in the last 168 hours. No results for input(s): LIPASE, AMYLASE in the last 168 hours. No results for input(s): AMMONIA in the last 168 hours. Coagulation Profile: No results for input(s): INR, PROTIME in the last 168  hours. Cardiac Enzymes: No results for input(s): CKTOTAL, CKMB, CKMBINDEX, TROPONINI in the last 168 hours. BNP (last 3 results) No results for input(s): PROBNP in the last 8760 hours. HbA1C: No results for input(s): HGBA1C in the last 72 hours. CBG: Recent Labs  Lab 09/19/20 0118  GLUCAP 99   Lipid Profile: No results for input(s): CHOL, HDL, LDLCALC, TRIG, CHOLHDL, LDLDIRECT in the last 72 hours. Thyroid Function Tests: No results for input(s): TSH, T4TOTAL, FREET4, T3FREE, THYROIDAB in the last 72 hours. Anemia Panel: No results for input(s): VITAMINB12, FOLATE, FERRITIN, TIBC, IRON, RETICCTPCT in the last 72 hours. Sepsis Labs: No results for input(s): PROCALCITON, LATICACIDVEN in the last 168 hours.  No results found for this or any previous visit (from the past 240 hour(s)).       Radiology Studies: No results found.      Scheduled Meds: . glycopyrrolate  0.2 mg Intravenous Q4H  . sodium chloride flush  3 mL Intravenous Q12H   Continuous Infusions: . sodium chloride       LOS: 12 days    Time spent: 25 minutes    Alberteen Sam, MD [facility]  If 7PM-7AM, please contact night-coverage [email or web address] 09/24/2020, 3:43 PM

## 2020-09-24 NOTE — TOC Progression Note (Signed)
Transition of Care (TOC) - Progression Note    Patient Details  Name: John Brotherton Sr. MRN: [id number] Date of Birth: 08-02-1951  Transition of Care [facility]) CM/SW Contact  Leitha Bleak, RN Phone Number: 09/24/2020, 2:50 PM  Clinical Narrative:   MD continuing Palliative discussion with family and better to be in a home like setting to keep patient comfortable. Mat Carne - daughter is okay with referral to [place] [facility] and open to them call her to discuss residential hospice.  Referral sent. Administration is closed. Left message with on Call operator.    Expected Discharge Plan: Hospice Medical Facility Barriers to Discharge: Hospice Bed not available  Expected Discharge Plan and Services Expected Discharge Plan: Hospice Medical Facility      Expected Discharge Date: 09/14/20                Readmission Risk Interventions Readmission Risk Prevention Plan 09/24/2020  Transportation Screening Complete  HRI or Home Care Consult Complete  Social Work Consult for Recovery Care Planning/Counseling Complete  Palliative Care Screening Complete  Medication Review [employer]) Complete  Some recent data might be hidden

## 2020-09-25 DIAGNOSIS — G9341 Metabolic encephalopathy: Secondary | ICD-10-CM | POA: Diagnosis not present

## 2020-09-25 NOTE — Progress Notes (Signed)
Pt sp02 77% on RA. Family member in room called another sister on the phone to make her aware of pt 02 level.  Both family members were agreeable to allow this nurse and RT place o2 via n/c at 2lpm on pt.

## 2020-09-25 NOTE — Progress Notes (Addendum)
PROGRESS NOTE    Garnet Overfield Sr.  JTT:[id number] DOB: 30-Oct-1950 DOA: 09/01/2020 PCP: Freddy Finner, NP   Brief Narrative:  Mr. Wachter is a 70 y.o. M with DM and recent episode of DKA, left BKA, metastatic pancreatic cancer status post palliative chemo who presented after being found unresponsive.  Patient was able to get up and go to the bathroom the morning of admission, went back to bed around 10 AM.  In the afternoon family went to check on him that he was unresponsive and lethargic.  EMS was activated, found his glucose in the 30s.  In the ER patient febrile, tachycardic, tachypneic.  CT head unremarkable, CT angiogram of the neck unremarkable.  Chest x-ray showed bilateral base opacities.  Patient was started on IV antibiotics, intubated, and admitted to the ICU.          Assessment & Plan:   Acute metabolic encephalopathy due to sepsis and hypoglycemia Persistent encephalopathy  Severe sepsis due to multifocal pneumonia, POA   Metastatic pancreatic cancer Severe protein calorie malnutrition As evidenced by weight loss of greater than 10 kg in the last 6 months, chronic illness, and poor oral take   Patient was admitted, started on broad-spectrum antibiotics and intubated.  He had no meaningful neurological recovery with weaning of sedation, and family elected to terminally extubate, as the patient would not have wished to be dependent on life support for prolonged period of time, and he has no meaningful medical expectation of recovery to his prior level of function at this point.  -Full comfort measures -Continue morphine drip and push IV as needed -Expect imminent hospital death -Life expectancy less than 1 week     DVT prophylaxis:N/A, comfort measures Code Status:DNR Family Communication: Daughter clay at bedside1/1 Disposition:  Status is: Inpatient  Dispo: The patient is from: home Anticipated d/c is [id number]  hospice Anticipated d/c date [id number] bed available Patient currently medically ready for discharge.     Subjective: No events overnight, no fever  Objective: Vitals:   09/23/20 1400 09/23/20 2041 09/24/20 2110 09/25/20 1258  BP: 131/89 135/87 (!) 148/84 (!) 166/100  Pulse: (!) 107 (!) 112 (!) 109 (!) 130  Resp: 18 20 20  (!) 40  Temp: 98.7 F (37.1 C) 98 F (36.7 C) 98.7 F (37.1 C) 98.5 F (36.9 C)  TempSrc: Oral  Oral Oral  SpO2: 90% (!) 85% 94% (!) 85%  Weight:      Height:        Intake/Output Summary (Last 24 hours) at 09/25/2020 1747 Last data filed at 09/25/2020 0500 Gross per 24 hour  Intake --  Output 300 ml  Net -300 ml   Filed Weights   09/15/20 0452 09/16/20 0500 09/17/20 0500  Weight: 66.1 kg 64.9 kg 64.9 kg    Examination:  General exam: Remains comatose Respiratory system: Tachypneic, no rales Cardiovascular system: Tachycardic, no murmur Gastrointestinal system: Abdomen soft without grimace to palpation, no ascites or distention Central nervous system: Unresponsive to voice or touch, does not respond to pain, no spontaneous verbalizations or movements Extremities: Left BKA Skin: Normal without rashes or lesions Psychiatry: Vegetative   Data Reviewed: I have personally reviewed following labs and imaging studies  CBC: No results for input(s): WBC, NEUTROABS, HGB, HCT, MCV, PLT in the last 168 hours. Basic Metabolic Panel: No results for input(s): NA, K, CL, CO2, GLUCOSE, BUN, CREATININE, CALCIUM, MG, PHOS in the last 168 hours. GFR: Estimated Creatinine Clearance: 81.1 mL/min (A) (by C-G formula  based on SCr of 0.54 mg/dL (L)). Liver Function Tests: No results for input(s): AST, ALT, ALKPHOS, BILITOT, PROT, ALBUMIN in the last 168 hours. No results for input(s): LIPASE, AMYLASE in the last 168 hours. No results for input(s): AMMONIA in the last 168 hours. Coagulation Profile: No results for input(s): INR, PROTIME in  the last 168 hours. Cardiac Enzymes: No results for input(s): CKTOTAL, CKMB, CKMBINDEX, TROPONINI in the last 168 hours. BNP (last 3 results) No results for input(s): PROBNP in the last 8760 hours. HbA1C: No results for input(s): HGBA1C in the last 72 hours. CBG: Recent Labs  Lab 09/19/20 0118  GLUCAP 99   Lipid Profile: No results for input(s): CHOL, HDL, LDLCALC, TRIG, CHOLHDL, LDLDIRECT in the last 72 hours. Thyroid Function Tests: No results for input(s): TSH, T4TOTAL, FREET4, T3FREE, THYROIDAB in the last 72 hours. Anemia Panel: No results for input(s): VITAMINB12, FOLATE, FERRITIN, TIBC, IRON, RETICCTPCT in the last 72 hours. Sepsis Labs: No results for input(s): PROCALCITON, LATICACIDVEN in the last 168 hours.  No results found for this or any previous visit (from the past 240 hour(s)).       Radiology Studies: No results found.      Scheduled Meds: . glycopyrrolate  0.2 mg Intravenous Q4H  . sodium chloride flush  3 mL Intravenous Q12H   Continuous Infusions: . sodium chloride    . morphine Stopped (09/25/20 0609)     LOS: 13 days    Time spent: 15 minutes    Edwin Dada, MD [facility]  If 7PM-7AM, please contact night-coverage [email or web address] 09/25/2020, 5:47 PM

## 2020-09-25 NOTE — Progress Notes (Signed)
Nurse called about pt spo2 pt placed on 2lpm can per patient family request

## 2020-09-25 DEATH — deceased

## 2020-09-26 DIAGNOSIS — G9341 Metabolic encephalopathy: Secondary | ICD-10-CM | POA: Diagnosis not present

## 2020-09-26 LAB — RESP PANEL BY RT-PCR (FLU A&B, COVID) ARPGX2
Influenza A by PCR: NEGATIVE
Influenza B by PCR: NEGATIVE
SARS Coronavirus 2 by RT PCR: NEGATIVE

## 2020-09-26 MED ORDER — MORPHINE BOLUS VIA INFUSION: 1.0000 mg | INTRAVENOUS | 0 refills | Status: AC | PRN

## 2020-09-26 MED ORDER — ACETAMINOPHEN 650 MG RE SUPP: 650.0000 mg | RECTAL | 0 refills | Status: AC | PRN

## 2020-09-26 MED ORDER — MORPHINE 100MG IN NS 100ML (1MG/ML) PREMIX INFUSION: 1.0000 mg/h | INTRAVENOUS | 0 refills | Status: AC

## 2020-09-26 MED ORDER — GLYCOPYRROLATE 0.2 MG/ML IJ SOLN: 0.2000 mg | INTRAMUSCULAR | Status: AC | PRN

## 2020-09-26 MED ORDER — LORAZEPAM 2 MG/ML IJ SOLN: 1.0000 mg | INTRAMUSCULAR | 0 refills | Status: AC | PRN

## 2020-09-26 NOTE — Discharge Summary (Signed)
Physician Discharge Summary  John Deupree Sr. SWN:[id number] DOB: 04-11-1951 DOA: 09/13/2020  PCP: Perlie Mayo, NP  Admit date: 09/18/2020 Discharge date: 09/26/2020  Admitted From: Home  Disposition:  Residential hospice    Discharge Condition: Declining  CODE STATUS: DNR   Brief/Interim Summary: [name] is a 70 y.o. M with metastatic pancreatic cancer on palliative chemo, DM and recent episode of DKA and left BKA who presented after being found unresponsive.  Per report, patient was able to get up and go to the bathroom the morning of admission, went back to bed around 10 AM.  In the afternoon family went to check on him that he was unresponsive and lethargic.  EMS was activated, found his glucose in the 30s.    In the ER patient febrile, tachycardic, tachypneic.  CT head unremarkable, CT angiogram of the neck unremarkable.  Chest x-ray showed bilateral base opacities.  Patient was started on IV antibiotics, intubated, and admitted to the ICU.       PRINCIPAL HOSPITAL DIAGNOSIS: Acute metabolic encephalopathy due to sepsis and hypoglycemia    Discharge Diagnoses:   Acute metabolic encephalopathy due to sepsis and hypoglycemia Persistent encephalopathy Severe sepsis due to multifocal pneumonia, POA Metastatic pancreatic cancer Severe protein calorie malnutrition  Patient was admitted, started on broad-spectrum antibiotics and IV dextrose and intubated.    He was treated with aggressive antibiotic therapy but had no meaningful neurological recovery with weaning of sedation, and family elected to terminally extubate.  The patient's family was able to articulate clearly that he would not have wished to be dependent on life support for prolonged period of time, nor to have his life artificially prolonged with feeding tubes, IV fluids and confinement to a nursing home, given that there was no medical expectation of recovery to his prior level of function.  Hospice were  consulted and the patient was started on morphine infusion.          Discharge Instructions   Allergies as of 09/26/2020      Reactions   Tea Itching, Rash      Medication List    STOP taking these medications   Accu-Chek Guide Me w/Device Kit   Accu-Chek Guide test strip Generic drug: glucose blood   Accu-Chek Softclix Lancets lancets   acetaminophen 500 MG tablet Commonly known as: TYLENOL Replaced by: acetaminophen 650 MG suppository   bisacodyl 5 MG EC tablet Commonly known as: DULCOLAX   blood glucose meter kit and supplies Kit   dexamethasone 10 MG/ML injection Commonly known as: DECADRON   fluorouracil CALGB 93818 in sodium chloride 0.9 % 150 mL   fosaprepitant 150 MG Solr injection Commonly known as: EMEND   Insulin Pen Needle 32G X 4 MM Misc   IRINOTECAN HCL IV   LEUCOVORIN CALCIUM IV   lidocaine-prilocaine cream Commonly known as: EMLA   lipase/protease/amylase 36000 UNITS Cpep capsule Commonly known as: Creon   metFORMIN 500 MG tablet Commonly known as: GLUCOPHAGE   OXALIPLATIN IV   OXYCODONE HCL PO   palonosetron 0.25 MG/5ML injection Commonly known as: ALOXI   potassium chloride SA 20 MEQ tablet Commonly known as: KLOR-CON   prochlorperazine 10 MG tablet Commonly known as: COMPAZINE   tamsulosin 0.4 MG Caps capsule Commonly known as: Flomax   Tresiba FlexTouch 100 UNIT/ML FlexTouch Pen Generic drug: insulin degludec     TAKE these medications   acetaminophen 650 MG suppository Commonly known as: TYLENOL Place 1 suppository (650 mg total) rectally every 4 (  four) hours as needed for fever. Replaces: acetaminophen 500 MG tablet   glycopyrrolate 0.2 MG/ML injection Commonly known as: ROBINUL Inject 1 mL (0.2 mg total) into the vein every 4 (four) hours as needed (secretions).   LORazepam 2 MG/ML injection Commonly known as: ATIVAN Inject 0.5 mLs (1 mg total) into the vein every 4 (four) hours as needed for anxiety or  seizure.   morphine 1 mg/mL Soln infusion Inject 1 mg/hr into the vein continuous.   morphine 5 mg/mL Soln Inject 1-2 mg into the vein every 15 (fifteen) minutes as needed (pain/dyspnea/air hunger/tachypnea).       Allergies  Allergen Reactions  . Tea Itching and Rash    Consultations:  Palliative Care   Procedures/Studies: CT Angio Head W or Wo Contrast  Result Date: 09/14/2020 CLINICAL DATA:  Found unresponsive. Recent diagnosis of pancreatic cancer. Stroke suspected. EXAM: CT ANGIOGRAPHY HEAD AND NECK TECHNIQUE: Multidetector CT imaging of the head and neck was performed using the standard protocol during bolus administration of intravenous contrast. Multiplanar CT image reconstructions and MIPs were obtained to evaluate the vascular anatomy. Carotid stenosis measurements (when applicable) are obtained utilizing NASCET criteria, using the distal internal carotid diameter as the denominator. CONTRAST:  57mL OMNIPAQUE IOHEXOL 350 MG/ML SOLN COMPARISON:  Head CT same day FINDINGS: CTA NECK FINDINGS Aortic arch: Aortic atherosclerosis. No aneurysm or dissection. Branching pattern is normal. Mild atherosclerotic change at the brachiocephalic vessel origins. Right carotid system: Common carotid artery widely patent to the bifurcation. Calcified plaque at the carotid bifurcation and ICA bulb. No stenosis compared to the more distal cervical ICA diameter. Left carotid system: Common carotid artery is patent to the bifurcation with areas of plaque but no significant narrowing. Carotid bifurcation shows calcified plaque. ICA bulb shows calcified plaque with minimal diameter of 2.5 mm. Compared to a more distal cervical ICA diameter of 4 mm, this indicates a 40% stenosis. Vertebral arteries: 30% stenosis of the proximal right subclavian artery. 50% stenosis of the right vertebral artery origin. Advanced calcified plaque of the left subclavian artery just proximal to the vertebral artery origin with  stenosis of 70%. Severe stenosis of the proximal left vertebral artery. Beyond their origins, both vertebral arteries are widely patent through the cervical region to the foramen magnum. Skeleton: Ordinary cervical spondylosis. Other neck: No mass or lymphadenopathy. Upper chest: Widespread patchy infiltrates in the left upper lobe consistent with bronchopneumonia. Underlying emphysema and pulmonary scarring. Review of the MIP images confirms the above findings CTA HEAD FINDINGS Anterior circulation: Both internal carotid arteries patent through the skull base and siphon regions. Severe stenosis in the carotid siphon regions, worse on the left than the right. Near occlusion on the left. Supraclinoid internal carotid arteries show severe atherosclerotic narrowing, worse on the left than the right. Flow is present in both anterior and middle cerebral arteries. There is considerable narrowing and irregularity of the more distal branch vessels, worse on the right than the left. I do not identify an acute vascular occlusion. Posterior circulation: Both vertebral arteries are patent through the foramen magnum to the basilar. No basilar stenosis. Posterior circulation branch vessels show flow. Atherosclerotic narrowing and irregularity of the PCA branches. Venous sinuses: Patent and normal. Anatomic variants: None significant. Review of the MIP images confirms the above findings IMPRESSION: 1. No acute large or medium vessel occlusion. 2. Atherosclerotic disease at both carotid bifurcations. No stenosis on the right. 40% stenosis of the left ICA bulb. 3. 50% stenosis of the right vertebral  artery origin. 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 Severe stenosis of the proximal left vertebral artery. 4. Severe stenosis in both carotid siphon and supraclinoid ICA regions. 5. Severe atherosclerotic narrowing and irregularity of the more distal  intracranial branch vessels diffusely. 6. Widespread patchy infiltrates in the left upper lobe consistent with bronchopneumonia. 7. Emphysema and aortic atherosclerosis. Aortic Atherosclerosis (ICD10-I70.0) and Emphysema (ICD10-J43.9). Electronically Signed   By: Nelson Chimes M.D.   On: 09/09/2020 18:20   CT Head Wo Contrast  Result Date: 09/04/2020 CLINICAL DATA:  Unresponsive, history of pancreatic cancer EXAM: CT HEAD WITHOUT CONTRAST TECHNIQUE: Contiguous axial images were obtained from the base of the skull through the vertex without intravenous contrast. COMPARISON:  None. FINDINGS: Brain: Evaluation is limited by patient motion throughout the study. Focal hypodensities in the bilateral basal ganglia consistent with chronic lacunar infarcts. No signs of acute infarct or hemorrhage. Lateral ventricles and remaining midline structures are unremarkable. No acute extra-axial fluid collections. No mass effect. Vascular: No hyperdense vessel or unexpected calcification. Skull: Normal. Negative for fracture or focal lesion. Sinuses/Orbits: No acute finding. Other: None. IMPRESSION: 1. Limited evaluation due to patient motion. No acute intracranial process. Electronically Signed   By: Randa Ngo M.D.   On: 09/11/2020 16:41   CT Angio Neck W and/or Wo Contrast  Result Date: 09/01/2020 CLINICAL DATA:  Found unresponsive. Recent diagnosis of pancreatic cancer. Stroke suspected. EXAM: CT ANGIOGRAPHY HEAD AND NECK TECHNIQUE: Multidetector CT imaging of the head and neck was performed using the standard protocol during bolus administration of intravenous contrast. Multiplanar CT image reconstructions and MIPs were obtained to evaluate the vascular anatomy. Carotid stenosis measurements (when applicable) are obtained utilizing NASCET criteria, using the distal internal carotid diameter as the denominator. CONTRAST:  31mL OMNIPAQUE IOHEXOL 350 MG/ML SOLN COMPARISON:  Head CT same day FINDINGS: CTA NECK FINDINGS  Aortic arch: Aortic atherosclerosis. No aneurysm or dissection. Branching pattern is normal. Mild atherosclerotic change at the brachiocephalic vessel origins. Right carotid system: Common carotid artery widely patent to the bifurcation. Calcified plaque at the carotid bifurcation and ICA bulb. No stenosis compared to the more distal cervical ICA diameter. Left carotid system: Common carotid artery is patent to the bifurcation with areas of plaque but no significant narrowing. Carotid bifurcation shows calcified plaque. ICA bulb shows calcified plaque with minimal diameter of 2.5 mm. Compared to a more distal cervical ICA diameter of 4 mm, this indicates a 40% stenosis. Vertebral arteries: 30% stenosis of the proximal right subclavian artery. 50% stenosis of the right vertebral artery origin. Advanced calcified plaque of the left subclavian artery just proximal to the vertebral artery origin with stenosis of 70%. Severe stenosis of the proximal left vertebral artery. Beyond their origins, both vertebral arteries are widely patent through the cervical region to the foramen magnum. Skeleton: Ordinary cervical spondylosis. Other neck: No mass or lymphadenopathy. Upper chest: Widespread patchy infiltrates in the left upper lobe consistent with bronchopneumonia. Underlying emphysema and pulmonary scarring. Review of the MIP images confirms the above findings CTA HEAD FINDINGS Anterior circulation: Both internal carotid arteries patent through the skull base and siphon regions. Severe stenosis in the carotid siphon regions, worse on the left than the right. Near occlusion on the left. Supraclinoid internal carotid arteries show severe atherosclerotic narrowing, worse on the left than the right. Flow is present in both anterior and middle cerebral arteries. There is considerable narrowing and irregularity of  the more distal branch vessels, worse on the right than the left. I do not identify an acute vascular occlusion.  Posterior circulation: Both vertebral arteries are patent through the foramen magnum to the basilar. No basilar stenosis. Posterior circulation branch vessels show flow. Atherosclerotic narrowing and irregularity of the PCA branches. Venous sinuses: Patent and normal. Anatomic variants: None significant. Review of the MIP images confirms the above findings IMPRESSION: 1. No acute large or medium vessel occlusion. 2. Atherosclerotic disease at both carotid bifurcations. No stenosis on the right. 40% stenosis of the left ICA bulb. 3. 50% stenosis of the right vertebral artery origin. 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 Severe stenosis of the proximal left vertebral artery. 4. Severe stenosis in both carotid siphon and supraclinoid ICA regions. 5. Severe atherosclerotic narrowing and irregularity of the more distal intracranial branch vessels diffusely. 6. Widespread patchy infiltrates in the left upper lobe consistent with bronchopneumonia. 7. Emphysema and aortic atherosclerosis. Aortic Atherosclerosis (ICD10-I70.0) and Emphysema (ICD10-J43.9). Electronically Signed   By: Paulina Fusi M.D.   On: 09/23/2020 18:20   DG Chest Port 1 View  Result Date: 09/16/2020 CLINICAL DATA:  Respiratory failure.  Pneumonia. EXAM: PORTABLE CHEST 1 VIEW COMPARISON:  Chest radiograph December 19, 21. CTA neck December 19, 21. FINDINGS: Endotracheal tube tip is approximately 4.8 cm above the carina. Similar position of a left Port-A-Cath with the tip projecting in the region of the superior cavoatrial junction. Improved lung opacities. Hazy left lung base opacity. No visible pleural effusions or pneumothorax. IMPRESSION: 1. Appropriate positioning of lines/tubes, as detailed above. 2. Improved lung opacities, compatible with improving pneumonia. 3. Subtle hazy opacity at the left lung base may represent layering small pleural effusion and/or  atelectasis on this semi upright radiograph. Electronically Signed   By: Feliberto Harts MD   On: 09/16/2020 08:00   DG Chest Portable 1 View  Result Date: 09/19/2020 CLINICAL DATA:  Post intubation EXAM: PORTABLE CHEST 1 VIEW COMPARISON:  September 12, 2020 FINDINGS: The left subclavian Port-A-Cath is unchanged. The endotracheal tube terminates above the carina by approximately 4.4 cm. The enteric tube extends below the left hemidiaphragm. There is no pneumothorax. Bilateral hazy airspace opacities are noted. There is no large pleural effusion. IMPRESSION: 1. Lines and tubes as above.  No pneumothorax. 2. Hazy bilateral airspace opacities which may represent multifocal pneumonia in the appropriate clinical setting. Electronically Signed   By: Katherine Mantle M.D.   On: 08/27/2020 20:32   DG Chest Port 1 View  Result Date: 08/28/2020 CLINICAL DATA:  Sepsis, unresponsive, history of pancreatic cancer EXAM: PORTABLE CHEST 1 VIEW COMPARISON:  05/21/2020 FINDINGS: Single frontal view of the chest demonstrates an unremarkable cardiac silhouette. Left chest wall port unchanged. Chronic areas of scarring are seen throughout the lungs, stable. No effusion or pneumothorax. There is a prominent skin fold overlying the left chest. No acute bony abnormalities. IMPRESSION: 1. Chronic areas of scarring.  No acute intrathoracic process. 2. Prominent skin fold overlying left lung.  No pneumothorax. Electronically Signed   By: Sharlet Salina M.D.   On: 09/05/2020 16:38   DG Foot Complete Right  Result Date: 09/08/2020 CLINICAL DATA:  Diabetic foot. Right foot pain with wound about the great toe. EXAM: RIGHT FOOT COMPLETE - 3+ VIEW COMPARISON:  None. FINDINGS: No abnormal bone density, bony destruction or erosion to suggest osteomyelitis. Normal alignment. Osteoarthritis of the first metatarsal phalangeal joint. Advanced vascular  calcifications. No soft tissue air or radiopaque foreign body. Site of wound is not  well delineated by radiograph. IMPRESSION: 1. No radiographic evidence of osteomyelitis. 2. Osteoarthritis of the first metatarsophalangeal joint. 3. Advanced vascular calcifications. Electronically Signed   By: Keith Rake M.D.   On: 09/08/2020 19:05   EEG adult  Result Date: 09/15/2020 Lora Havens, MD     09/15/2020  9:52 AM Patient Name: John Islam Sr. MRN: [id number] Epilepsy Attending: Lora Havens Referring Physician/Provider: Dr Kara Mead Date: 09/14/2020 Duration: 23.35 mins Patient history: 70 year old male with altered mental status.  EEG to evaluate for seizures. Level of alertness: comatose AEDs during EEG study: None Technical aspects: This EEG study was done with scalp electrodes positioned according to the 10-20 International system of electrode placement. Electrical activity was acquired at a sampling rate of $Remov'500Hz'IgRFkq$  and reviewed with a high frequency filter of $RemoveB'70Hz'tzqZIPCg$  and a low frequency filter of $RemoveB'1Hz'YwjGSRqF$ . EEG data were recorded continuously and digitally stored. Description: EEG showed continuous generalized 2-3 Hz delta slowing.  Periodic discharges with triphasic morphology at 1 Hz were also noted.  Hyperventilation and photic stimulation were not performed.   ABNORMALITY -Continuous slow, generalized -Periodic discharges with triphasic morphology, generalized IMPRESSION: This study is suggestive of severe diffuse encephalopathy, nonspecific etiology but could be secondary to toxic-metabolic causes. Of note, at times periodic discharges with triphasic morphology can be on the ictal-interictal continuum but given the morphology and the frequency of these discharges it is less likely.  No seizures or definite epileptiform discharges were seen throughout the recording. Priyanka Barbra Sarks      Subjective: No fever, no respiratory distress, the patient appears closer to death.  Discharge Exam: Vitals:   09/25/20 2227 09/25/20 2240  BP:    Pulse: (!) 134   Resp:    Temp: (!)  103.1 F (39.5 C)   SpO2: (!) 73% (!) 77%   Vitals:   09/25/20 1258 09/25/20 2127 09/25/20 2227 09/25/20 2240  BP: (!) 166/100 115/74    Pulse: (!) 130 (!) 136 (!) 134   Resp: (!) 40 (!) 28    Temp: 98.5 F (36.9 C) (!) 103.1 F (39.5 C) (!) 103.1 F (39.5 C)   TempSrc: Oral Oral Oral   SpO2: (!) 85% (!) 66% (!) 73% (!) 77%  Weight:      Height:        General: Comatose, mouth open Cardiovascular: RRR, nl S1-S2, no murmurs appreciated.  Mild edema. Respiratory: Tachypneic, agonal Abdominal: Abdomen soft and no grimace to palpation Neuro/Psych: Comatose.   The results of significant diagnostics from this hospitalization (including imaging, microbiology, ancillary and laboratory) are listed below for reference.     Microbiology: Recent Results (from the past 240 hour(s))  Resp Panel by RT-PCR (Flu A&B, Covid) Nasopharyngeal Swab     Status: None   Collection Time: 09/26/20 11:51 AM   Specimen: Nasopharyngeal Swab; Nasopharyngeal(NP) swabs in vial transport medium  Result Value Ref Range Status   SARS Coronavirus 2 by RT PCR NEGATIVE NEGATIVE Final    Comment: (NOTE) SARS-CoV-2 target nucleic acids are NOT DETECTED.  The SARS-CoV-2 RNA is generally detectable in upper respiratory specimens during the acute phase of infection. The lowest concentration of SARS-CoV-2 viral copies this assay can detect is 138 copies/mL. A negative result does not preclude SARS-Cov-2 infection and should not be used as the sole basis for treatment or other patient management decisions. A negative result may occur with  improper specimen  collection/handling, submission of specimen other than nasopharyngeal swab, presence of viral mutation(s) within the areas targeted by this assay, and inadequate number of viral copies(<138 copies/mL). A negative result must be combined with clinical observations, patient history, and epidemiological information. The expected result is Negative.  Fact Sheet  for Patients:  BloggerCourse.com  Fact Sheet for Healthcare Providers:  SeriousBroker.it  This test is no t yet approved or cleared by the [place] FDA and  has been authorized for detection and/or diagnosis of SARS-CoV-2 by FDA under an Emergency Use Authorization (EUA). This EUA will remain  in effect (meaning this test can be used) for the duration of the COVID-19 declaration under Section 564(b)(1) of the Act, 21 U.S.C.section 360bbb-3(b)(1), unless the authorization is terminated  or revoked sooner.       Influenza A by PCR NEGATIVE NEGATIVE Final   Influenza B by PCR NEGATIVE NEGATIVE Final    Comment: (NOTE) The Xpert Xpress SARS-CoV-2/FLU/RSV plus assay is intended as an aid in the diagnosis of influenza from Nasopharyngeal swab specimens and should not be used as a sole basis for treatment. Nasal washings and aspirates are unacceptable for Xpert Xpress SARS-CoV-2/FLU/RSV testing.  Fact Sheet for Patients: BloggerCourse.com  Fact Sheet for Healthcare Providers: SeriousBroker.it  This test is not yet approved or cleared by the [place] FDA and has been authorized for detection and/or diagnosis of SARS-CoV-2 by FDA under an Emergency Use Authorization (EUA). This EUA will remain in effect (meaning this test can be used) for the duration of the COVID-19 declaration under Section 564(b)(1) of the Act, 21 U.S.C. section 360bbb-3(b)(1), unless the authorization is terminated or revoked.  Performed at [facility], [address] [place], [place] [address]      Labs: BNP (last 3 results) No results for input(s): BNP in the last 8760 hours. Basic Metabolic Panel: No results for input(s): NA, K, CL, CO2, GLUCOSE, BUN, CREATININE, CALCIUM, MG, PHOS in the last 168 hours. Liver Function Tests: No results for input(s): AST, ALT, ALKPHOS, BILITOT, PROT, ALBUMIN in  the last 168 hours. No results for input(s): LIPASE, AMYLASE in the last 168 hours. No results for input(s): AMMONIA in the last 168 hours. CBC: No results for input(s): WBC, NEUTROABS, HGB, HCT, MCV, PLT in the last 168 hours. Cardiac Enzymes: No results for input(s): CKTOTAL, CKMB, CKMBINDEX, TROPONINI in the last 168 hours. BNP: Invalid input(s): POCBNP CBG: No results for input(s): GLUCAP in the last 168 hours. D-Dimer No results for input(s): DDIMER in the last 72 hours. Hgb A1c No results for input(s): HGBA1C in the last 72 hours. Lipid Profile No results for input(s): CHOL, HDL, LDLCALC, TRIG, CHOLHDL, LDLDIRECT in the last 72 hours. Thyroid function studies No results for input(s): TSH, T4TOTAL, T3FREE, THYROIDAB in the last 72 hours.  Invalid input(s): FREET3 Anemia work up No results for input(s): VITAMINB12, FOLATE, FERRITIN, TIBC, IRON, RETICCTPCT in the last 72 hours. Urinalysis    Component Value Date/Time   COLORURINE YELLOW 09/17/2020 1520   APPEARANCEUR CLEAR 09/10/2020 1520   LABSPEC 1.012 09/19/2020 1520   PHURINE 8.0 09/08/2020 1520   GLUCOSEU 50 (A) 09/05/2020 1520   HGBUR NEGATIVE 09/18/2020 1520   BILIRUBINUR NEGATIVE 09/14/2020 1520   KETONESUR NEGATIVE 09/17/2020 1520   PROTEINUR NEGATIVE 09/19/2020 1520   NITRITE NEGATIVE 08/30/2020 1520   LEUKOCYTESUR NEGATIVE 09/13/2020 1520   Sepsis Labs Invalid input(s): PROCALCITONIN,  WBC,  LACTICIDVEN Microbiology Recent Results (from the past 240 hour(s))  Resp Panel by RT-PCR (Flu  A&B, Covid) Nasopharyngeal Swab     Status: None   Collection Time: 09/26/20 11:51 AM   Specimen: Nasopharyngeal Swab; Nasopharyngeal(NP) swabs in vial transport medium  Result Value Ref Range Status   SARS Coronavirus 2 by RT PCR NEGATIVE NEGATIVE Final    Comment: (NOTE) SARS-CoV-2 target nucleic acids are NOT DETECTED.  The SARS-CoV-2 RNA is generally detectable in upper respiratory specimens during the acute phase of  infection. The lowest concentration of SARS-CoV-2 viral copies this assay can detect is 138 copies/mL. A negative result does not preclude SARS-Cov-2 infection and should not be used as the sole basis for treatment or other patient management decisions. A negative result may occur with  improper specimen collection/handling, submission of specimen other than nasopharyngeal swab, presence of viral mutation(s) within the areas targeted by this assay, and inadequate number of viral copies(<138 copies/mL). A negative result must be combined with clinical observations, patient history, and epidemiological information. The expected result is Negative.  Fact Sheet for Patients:  EntrepreneurPulse.com.au  Fact Sheet for Healthcare Providers:  IncredibleEmployment.be  This test is no t yet approved or cleared by the [place] FDA and  has been authorized for detection and/or diagnosis of SARS-CoV-2 by FDA under an Emergency Use Authorization (EUA). This EUA will remain  in effect (meaning this test can be used) for the duration of the COVID-19 declaration under Section 564(b)(1) of the Act, 21 U.S.C.section 360bbb-3(b)(1), unless the authorization is terminated  or revoked sooner.       Influenza A by PCR NEGATIVE NEGATIVE Final   Influenza B by PCR NEGATIVE NEGATIVE Final    Comment: (NOTE) The Xpert Xpress SARS-CoV-2/FLU/RSV plus assay is intended as an aid in the diagnosis of influenza from Nasopharyngeal swab specimens and should not be used as a sole basis for treatment. Nasal washings and aspirates are unacceptable for Xpert Xpress SARS-CoV-2/FLU/RSV testing.  Fact Sheet for Patients: EntrepreneurPulse.com.au  Fact Sheet for Healthcare Providers: IncredibleEmployment.be  This test is not yet approved or cleared by the [place] FDA and has been authorized for detection and/or diagnosis of SARS-CoV-2  by FDA under an Emergency Use Authorization (EUA). This EUA will remain in effect (meaning this test can be used) for the duration of the COVID-19 declaration under Section 564(b)(1) of the Act, 21 U.S.C. section 360bbb-3(b)(1), unless the authorization is terminated or revoked.  Performed at [facility], [address] [place], [place] [address]      Time coordinating discharge: 40 minutes The [place] controlled substances registry was not reviewed for this hospice patient.       SIGNED:   Edwin Dada, MD  [facility] 09/26/2020, 2:52 PM

## 2020-09-26 NOTE — TOC Progression Note (Signed)
Transition of Care (TOC) - Progression Note    Patient Details  Name: John Pagliarulo Sr. MRN: [id number] Date of Birth: November 25, 1950  Transition of Care [facility]) CM/SW Contact  Barry Brunner, LCSW Phone Number: 09/26/2020, 4:31 PM  Clinical Narrative:    CSW spoke with patient's daughter about residential hospice referral. Patient's daughter stated that she was concerned about visitation and having her children be able to see her father. Patient's daughter also reported that she was concerned about the nurses availability to attend to her father along with the amount of morphine he was being given. CSW informed patient that hospice's visitation policy allows for 4 people in there room plus any children under 16. CSW also discussed the ability for the hospice nurse to give more specialized attention to her father due to the hospice nurse not being in an acute care setting with other patients who have varying needs. CSW expressed to patients daughter that it was noted that patient was experiencing discomfort and that patients in the hospice home setting often experience less discomfort due to less knows from machines, intercom messages, and decreased number of staff entering the room. Patient's daughter [name] expressed that she would like patient to be on less morphine, but also noticed that patient was experiencing discomfort. [name] agreeable to hospice referral. CSW placed referral with hospice and faxed notes with demographic information. Hospice reported that they would be able to take patient on 09/26/2020. CSW completed med necessity and set up transportation. CSW notified by MD that patient's family had called [facility] and stated that they no longer wanted hospice. MD and nurse consulted with family. Family now agreeable to hospice. CSW attempted to contact [facility] to update them. CSW not able to reach [facility]. Nurse rescheduled EMS for 10/01/2020. TOC to  follow.   Expected Discharge Plan: Hospice Medical Facility Barriers to Discharge: Hospice Bed not available  Expected Discharge Plan and Services Expected Discharge Plan: Hospice Medical Facility         Expected Discharge Date: 09/26/20                                     Social Determinants of Health (SDOH) Interventions    Readmission Risk Interventions Readmission Risk Prevention Plan 09/24/2020  Transportation Screening Complete  HRI or Home Care Consult Complete  Social Work Consult for Recovery Care Planning/Counseling Complete  Palliative Care Screening Complete  Medication Review Oceanographer) Complete  Some recent data might be hidden

## 2020-09-26 NOTE — Plan of Care (Signed)

## 2020-10-05 ENCOUNTER — Ambulatory Visit (HOSPITAL_COMMUNITY): Payer: Medicare Other

## 2020-10-05 ENCOUNTER — Ambulatory Visit (HOSPITAL_COMMUNITY): Payer: Medicare Other | Admitting: Hematology

## 2020-10-05 ENCOUNTER — Other Ambulatory Visit (HOSPITAL_COMMUNITY): Payer: Medicare Other

## 2020-10-07 ENCOUNTER — Encounter (HOSPITAL_COMMUNITY): Payer: Medicare Other

## 2020-10-26 NOTE — Progress Notes (Signed)
RN called to report that patient passed at 2:50 AM on 10-19-2020

## 2020-10-26 NOTE — TOC Transition Note (Signed)
Transition of Care [facility]) - CM/SW Discharge Note  Patient Details  Name: John Copher Sr. MRN: [id number] Date of Birth: [birth year]  Transition of Care [facility]) CM/SW Contact:  Ewing Schlein, LCSW Phone Number: 10/21/2020, 8:44 AM  Clinical Narrative: CSW notified Cassandra with [facility] that patient expired. CSW followed up with EMS to cancel transportation. TOC signing off.  Final next level of care: Expired Barriers to Discharge: Barriers Resolved  Patient Goals and CMS Choice Patient states their goals for this hospitalization and ongoing recovery are:: to go to [facility].gov Compare Post Acute Care list provided to:: Patient Represenative (must comment) Choice offered to / list presented to : Adult Children  Discharge Plan and Services       DME Arranged: N/A DME Agency: NA HH Arranged: NA HH Agency: NA  Readmission Risk Interventions Readmission Risk Prevention Plan 09/24/2020  Transportation Screening Complete  HRI or Home Care Consult Complete  Social Work Consult for Recovery Care Planning/Counseling Complete  Palliative Care Screening Complete  Medication Review Oceanographer) Complete  Some recent data might be hidden

## 2020-10-26 NOTE — Progress Notes (Signed)
Pt passed at 0250 this morning peacefully in his sleep with family at bedside. Verified by Lemont Fillers., RN. Family remained at bedside until 6a. Remaining morphine from drip wasted in stericycle with Lemont Fillers., RN. Post mortem care completed, awaiting security to take pt down to morgue pending return of elevator function.

## 2020-10-26 DEATH — deceased

## 2020-11-02 ENCOUNTER — Ambulatory Visit: Payer: Medicaid Other | Admitting: Nurse Practitioner

## 2021-11-09 IMAGING — CT CT ABD-PELV W/ CM
2 of 5 series · 15 of 46 positions shown, 17 images · IV contrast (Omnipaque or Isovue)
Comparison: None.

CLINICAL DATA: LEFT upper quadrant abdominal pain.

EXAM:
CT ABDOMEN AND PELVIS WITH CONTRAST
TECHNIQUE: Multidetector CT imaging of the abdomen and pelvis was performed
using the standard protocol following bolus administration of
intravenous contrast.
CONTRAST:  100mL OMNIPAQUE IOHEXOL 300 MG/ML  SOLN

[Series 2: axial st · axial · 0.87mm/px · z∈[+746,+1166]mm · 12 of 94 slices shown, 14 images]
[im 5/94  soft-tissue]
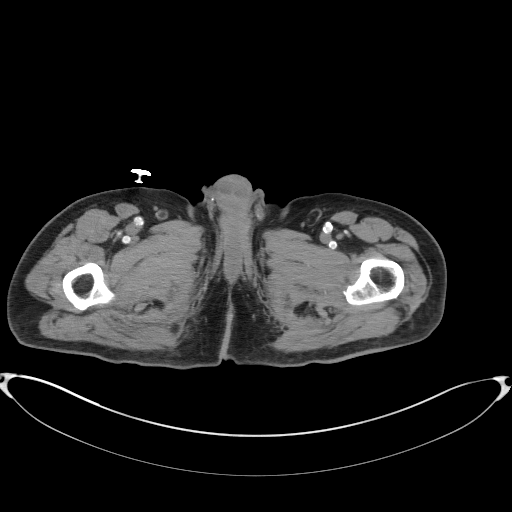
[im 5/94  bone]
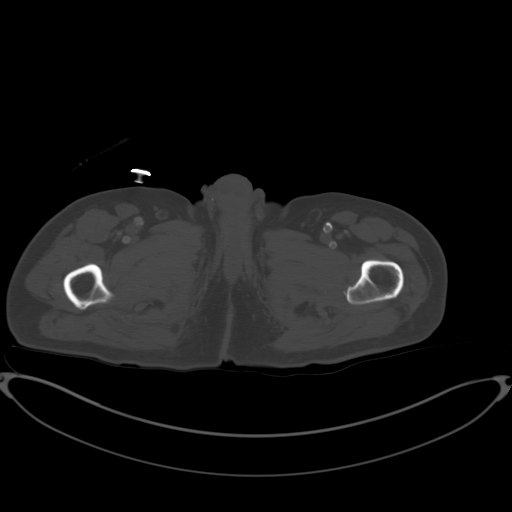
[im 15/94  soft-tissue]
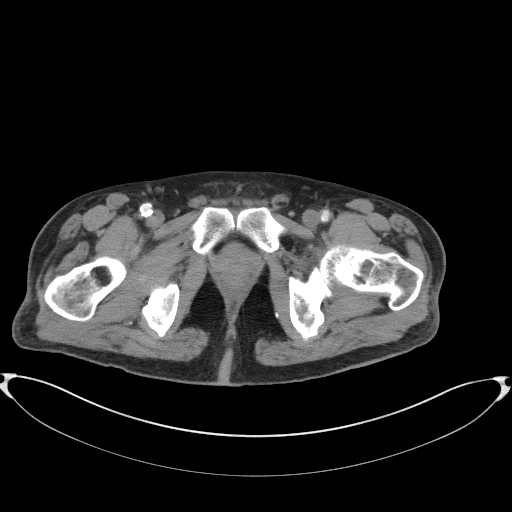
[im 20/94  soft-tissue]
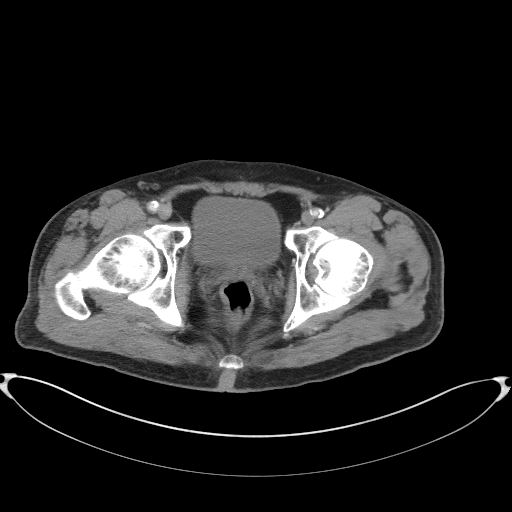
[im 30/94  soft-tissue]
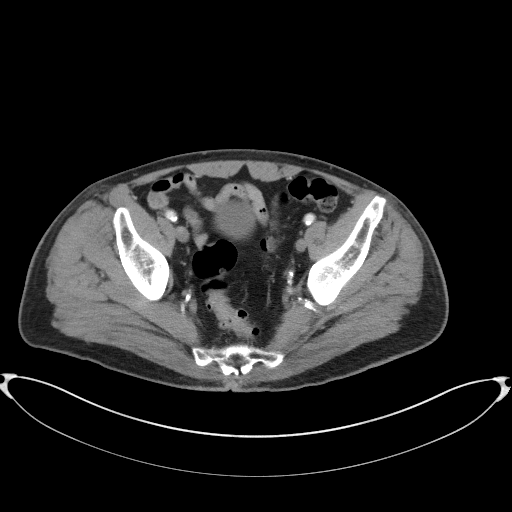
[im 35/94  soft-tissue]
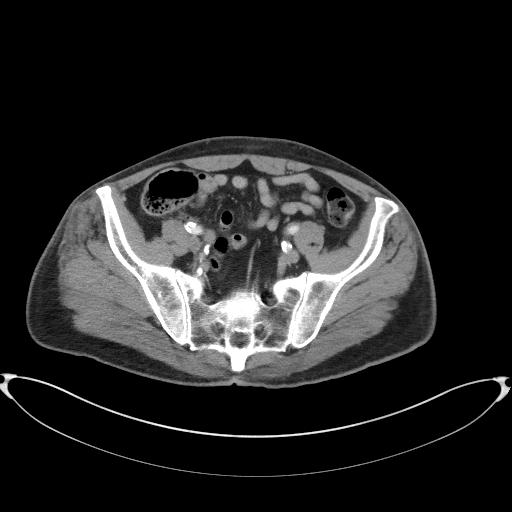
[im 45/94  soft-tissue]
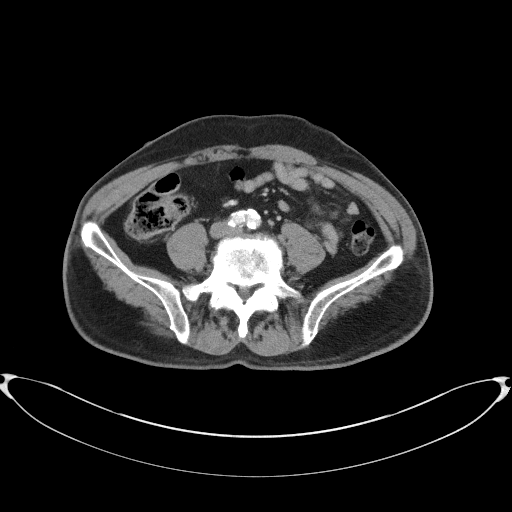
[im 49/94  soft-tissue]
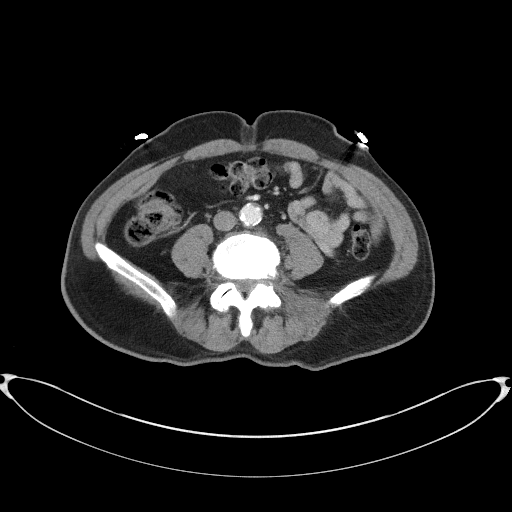
[im 59/94  soft-tissue]
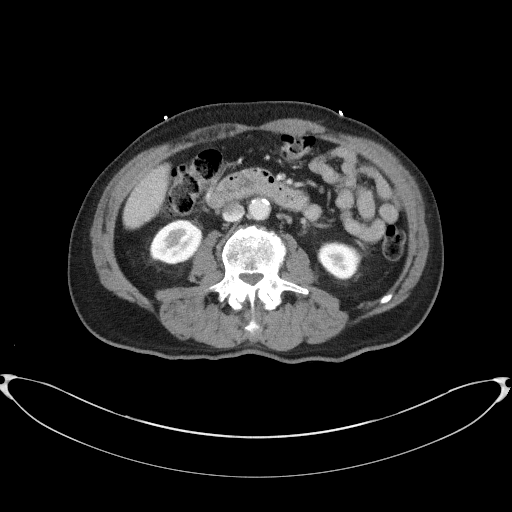
[im 64/94  soft-tissue]
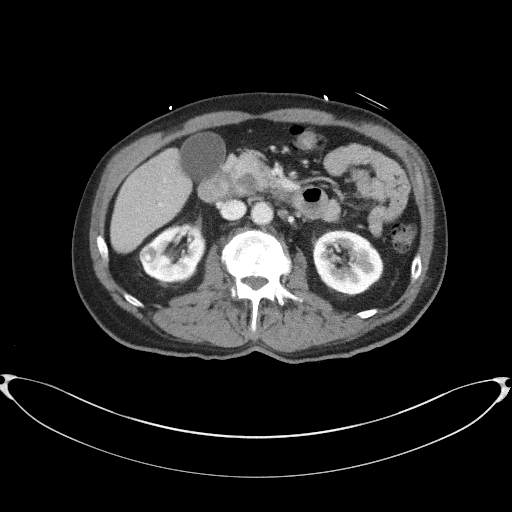
[im 64/94  bone]
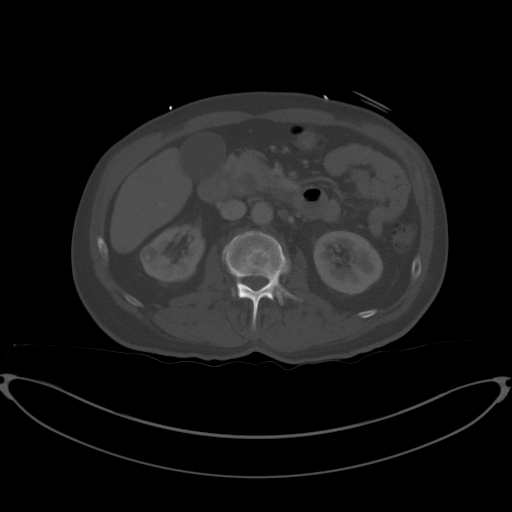
[im 74/94  soft-tissue]
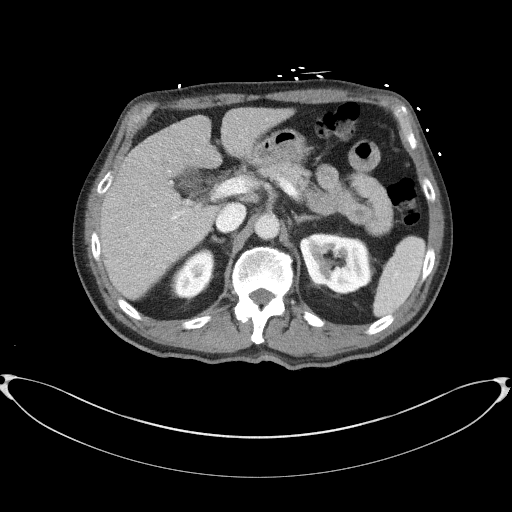
[im 79/94  soft-tissue]
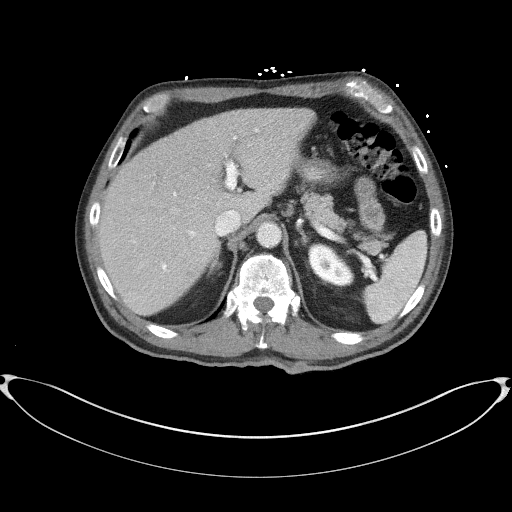
[im 89/94  soft-tissue]
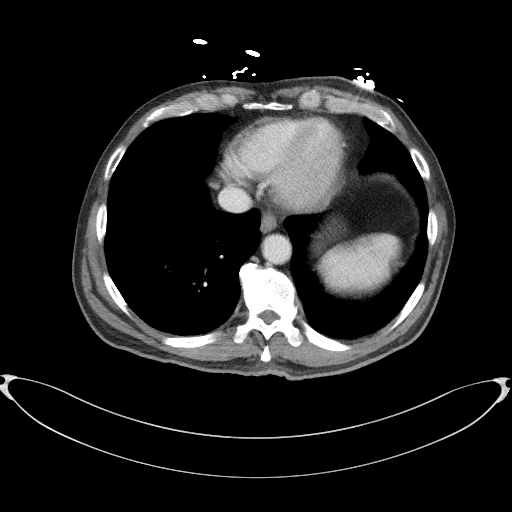

[Series 5: coronal st · coronal · 0.82mm/px · 3 of 102 slices shown]
[im 34/102  soft-tissue]
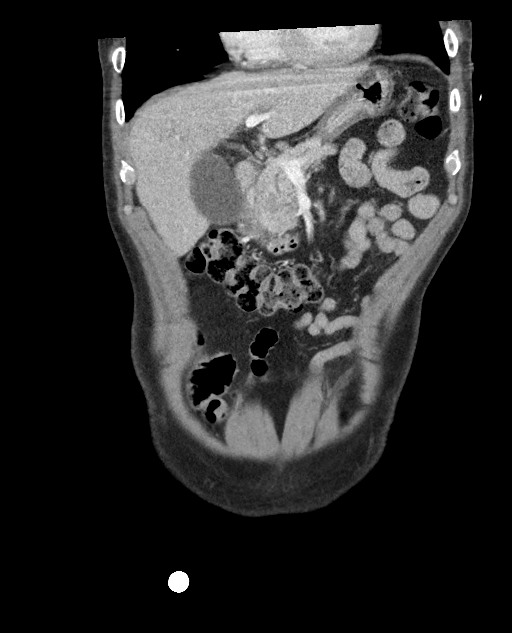
[im 45/102  soft-tissue]
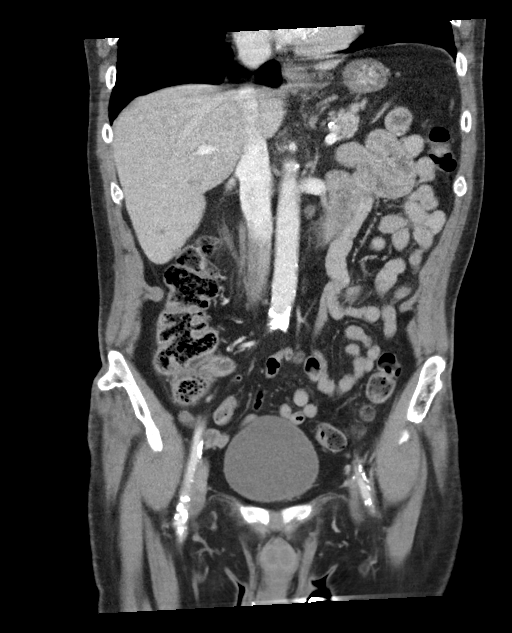
[im 57/102  soft-tissue]
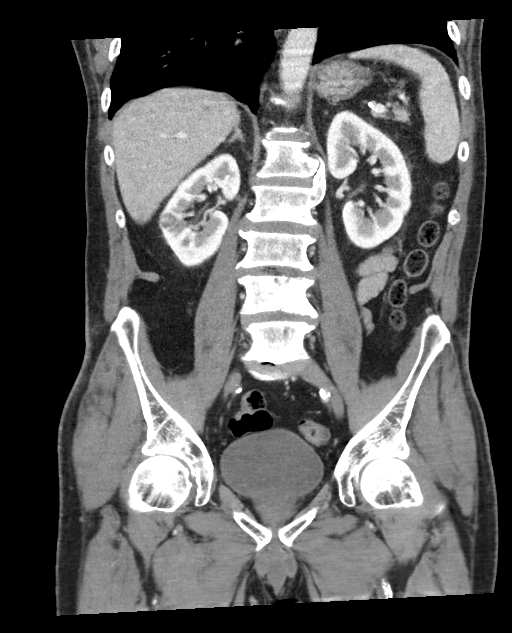

[15 of 46 positions shown; findings below may reference images not displayed]

FINDINGS: Lower chest: 5 mm pleural based nodular density within the RIGHT
lower lobe. Emphysematous blebs at the RIGHT lung base.

Hepatobiliary: Scattered small hypodense foci throughout the
bilateral liver lobes, largest within the posterior RIGHT liver lobe
measuring 1.1 cm majority too small to definitively characterize
largest suspicious for neoplastic/metastatic process.

Gallstones. Questionable gallbladder wall thickening. No
pericholecystic fluid. No bile duct dilatation seen

Pancreas: Hypodense mass within the head/uncinate process of the
pancreas, measuring 2.2 cm, highly suspicious for pancreatic
neoplasm. No pancreatic duct dilatation seen.

Spleen: Normal in size without focal abnormality.

Adrenals/Urinary Tract: Adrenal glands appear normal.

1.4 cm mass within the lateral cortex of the RIGHT kidney, with
probable enhancement centrally, suspicious for additional neoplastic
mass, alternatively angiomyolipoma (axial series 2, image 31;
coronal series 5, image 59). LEFT kidney appears. No hydronephrosis.
No ureteral or bladder calculi identified. Bladder appears normal.

Stomach/Bowel: No dilated large or small bowel loops. No evidence of
bowel wall inflammation. Appendix is normal. Stomach is
unremarkable, partially decompressed.

Vascular/Lymphatic: Aortic atherosclerosis. No acute appearing
vascular abnormality.

Mildly prominent lymph nodes within the upper abdomen, just above
the pancreatic head with short axis measurement of 11 mm and
portacaval space with short axis measurement of 12 mm. No enlarged
lymph nodes seen elsewhere within the abdomen or pelvis.

Reproductive: Prostate is unremarkable.

Other: No free fluid or free intraperitoneal air.

Musculoskeletal: Degenerative spondylosis of the thoracolumbar
spine, mild to moderate in degree. No acute or suspicious osseous
findings identified.
IMPRESSION: 1. Hypodense mass within the pancreatic head/uncinate process,
measuring 2.2 cm, highly suspicious for pancreatic neoplasm.
Recommend further characterization with pancreatic protocol MRI.
2. Scattered small hypodense foci throughout the bilateral liver
lobes, largest within the posterior RIGHT liver lobe measuring
cm, majority too small to definitively characterize but largest
suspicious for neoplastic/metastatic process.
3. 1.4 cm mass within the lateral cortex of the RIGHT kidney, with
probable enhancement centrally, suspicious for additional neoplastic
mass, alternatively benign angiomyolipoma. Recommend further
characterization with nonemergent renal MRI.
4. Mildly prominent lymph nodes within the upper abdomen, just above
the pancreatic head and portacaval space, suspicious for metastatic
lymphadenopathy.
5. 5 mm pleural based nodular density within the RIGHT lower lobe,
nonspecific but suspicious given the presumed pancreatic neoplasm,
liver metastases and possible concomitant renal neoplasm.
6. Cholelithiasis. Questionable gallbladder wall thickening but no
pericholecystic fluid to suggest acute cholecystitis. If any
localizable RIGHT upper quadrant pain and/or clinical suspicion for
acute cholecystitis, consider RIGHT upper quadrant ultrasound for
further characterization.

Aortic Atherosclerosis (LX6YN-Q3I.I) and Emphysema ([id number].I).

## 2021-12-25 IMAGING — MR MR ABDOMEN WO/W CM MRCP
20 of 21 series · 45 of 48 positions shown · IV contrast (7ML Gadavist)
Comparison: No prior abdominal MRI. CT the abdomen and pelvis
04/24/2020.

CLINICAL DATA: 69-year-old male with history of pancreatic cancer
presenting with jaundice and weakness.

EXAM:
MRI ABDOMEN WITHOUT AND WITH CONTRAST (INCLUDING MRCP)
TECHNIQUE: Multiplanar multisequence MR imaging of the abdomen was performed
both before and after the administration of intravenous contrast.
Heavily T2-weighted images of the biliary and pancreatic ducts were
obtained, and three-dimensional MRCP images were rendered by post
processing.
CONTRAST:  7mL GADAVIST GADOBUTROL 1 MMOL/ML IV SOLN

[Series 4: ax haste · axial · 6.0mm · 1.19mm/px · z∈[-150,+73]mm · 2 of 32 slices shown]
[im 1/32]
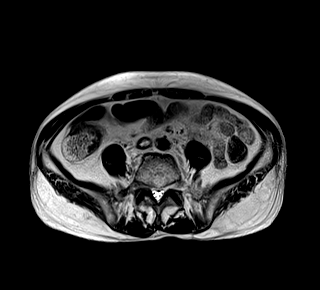
[im 32/32]
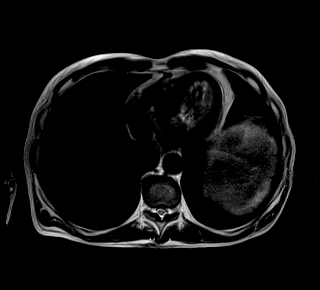

[Series 5: cor haste · coronal · 6.0mm · 1.25mm/px · 1 of 28 slices shown]
[im 1/28]
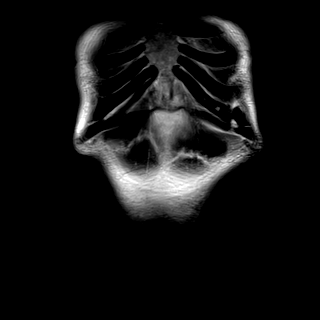

[Series 7: T2 fat-sat · axial · 6.0mm · 1.19mm/px · 1 of 32 slices shown]
[im 1/32]
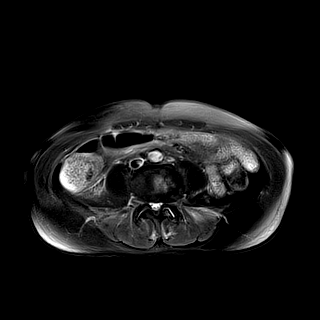

[Series 8: DWI · axial · 6.0mm · 1.42mm/px · 1 of 32 slices shown (1 of 4)]
[im 1/32]
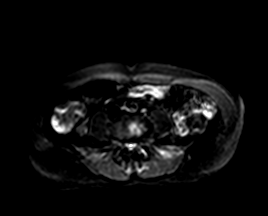

[Series 8: DWI · axial · 6.0mm · 1.42mm/px · 1 of 32 slices shown (2 of 4)]
[im 1/32]
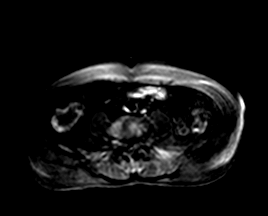

[Series 8: DWI · axial · 6.0mm · 1.42mm/px · 1 of 32 slices shown (3 of 4)]
[im 1/32]
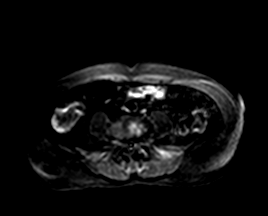

[Series 9: DWI · axial · 6.0mm · 1.42mm/px · 1 of 32 slices shown (4 of 4)]
[im 1/32]
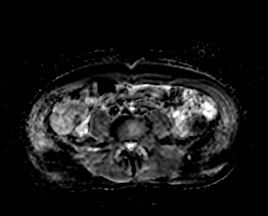

[Series 10: ax in and · axial · 3.0mm · 1.19mm/px · z∈[-145,+68]mm · 3 of 72 slices shown (1 of 2)]
[im 1/72]
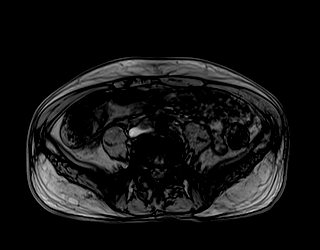
[im 36/72]
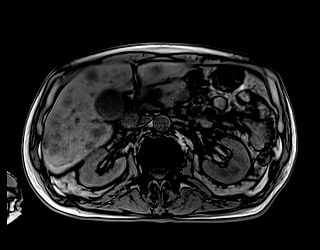
[im 72/72]
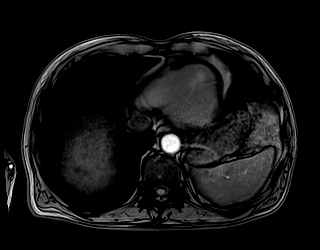

[Series 11: ax in and · axial · 3.0mm · 1.19mm/px · z∈[-145,+68]mm · 3 of 72 slices shown (2 of 2)]
[im 1/72]
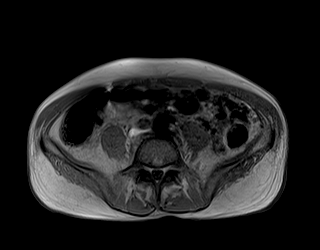
[im 36/72]
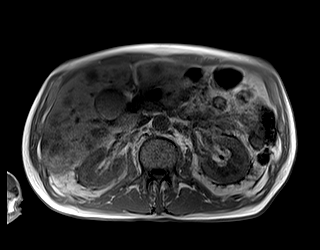
[im 72/72]
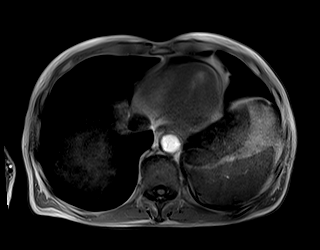

[Series 16: MRCP · coronal · 4.0mm · 1.12mm/px · 1 of 15 slices shown]
[im 1/15]
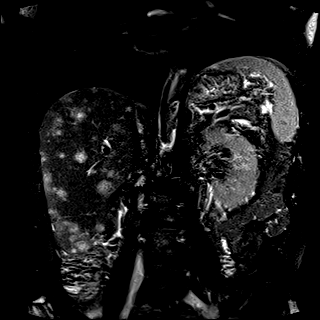

[Series 17: T1 dynamic · axial · non-contrast · 3.0mm · 1.19mm/px · z∈[-157,+80]mm · 3 of 80 slices shown (1 of 4)]
[im 1/80]
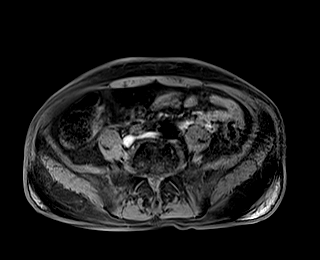
[im 40/80]
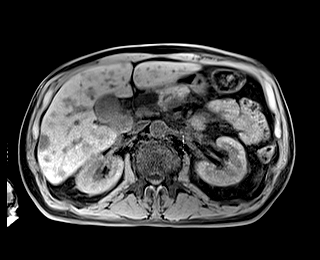
[im 80/80]
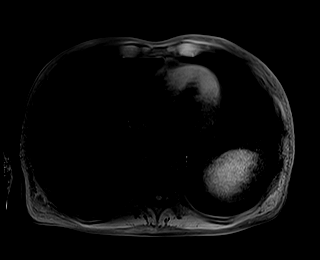

[Series 19: T1 dynamic post-contrast · axial · 3.0mm · 1.19mm/px · z∈[-157,+80]mm · 3 of 80 slices shown (1 of 6)]
[im 1/80]
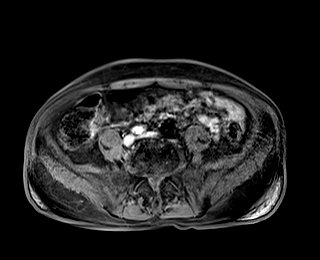
[im 40/80]
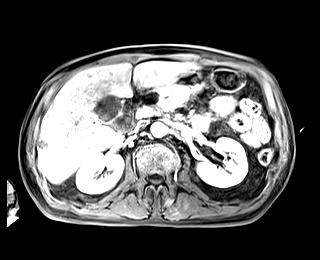
[im 80/80]
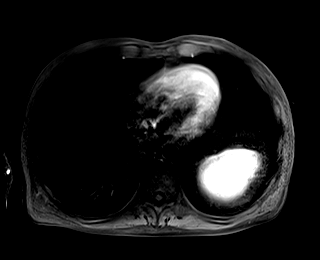

[Series 20: T1 dynamic · axial · 3.0mm · 1.19mm/px · z∈[-157,+80]mm · 3 of 80 slices shown (2 of 4)]
[im 1/80]
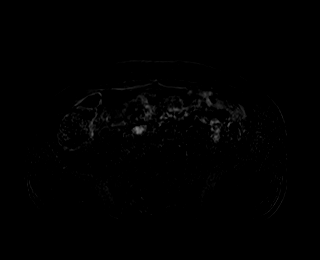
[im 40/80]
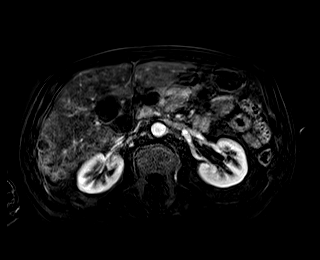
[im 80/80]
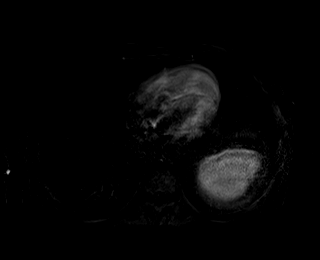

[Series 21: T1 dynamic post-contrast · axial · 3.0mm · 1.19mm/px · z∈[-157,+80]mm · 3 of 80 slices shown (2 of 6)]
[im 1/80]
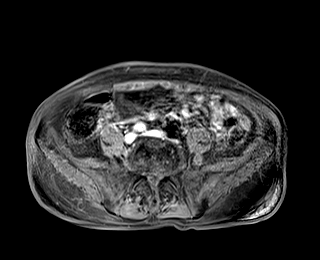
[im 40/80]
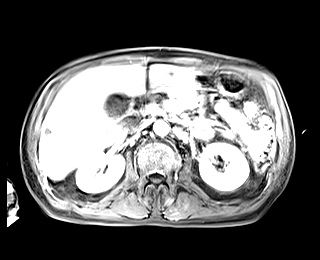
[im 80/80]
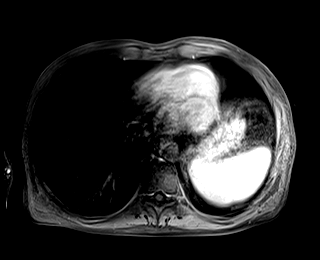

[Series 22: T1 dynamic · axial · 3.0mm · 1.19mm/px · z∈[-157,+80]mm · 3 of 80 slices shown (3 of 4)]
[im 1/80]
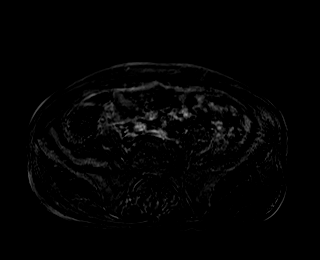
[im 40/80]
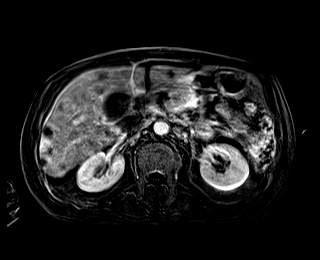
[im 80/80]
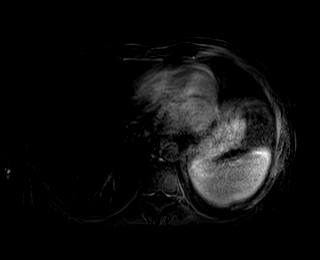

[Series 23: T1 dynamic post-contrast · axial · 3.0mm · 1.19mm/px · z∈[-157,+80]mm · 3 of 80 slices shown (3 of 6)]
[im 1/80]
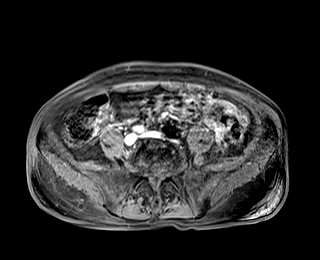
[im 40/80]
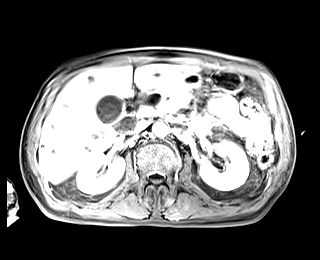
[im 80/80]
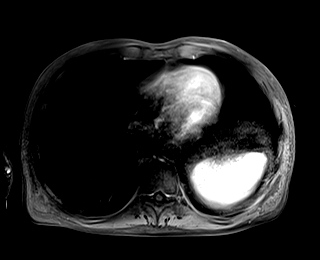

[Series 24: T1 dynamic · axial · 3.0mm · 1.19mm/px · z∈[-157,+80]mm · 3 of 80 slices shown (4 of 4)]
[im 1/80]
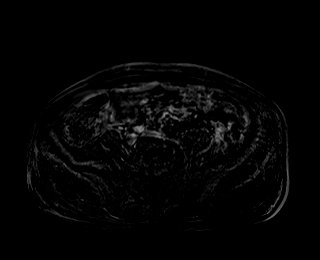
[im 40/80]
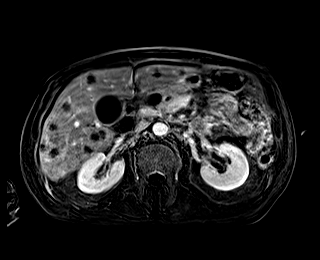
[im 80/80]
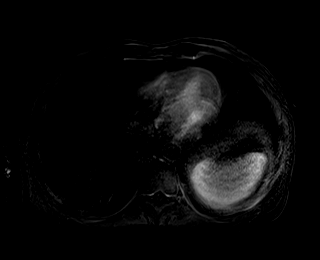

[Series 25: T1 dynamic post-contrast · coronal · 3.0mm · 1.46mm/px · 3 of 72 slices shown (4 of 6)]
[im 1/72]
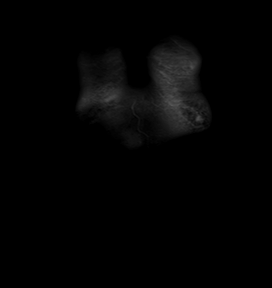
[im 36/72]
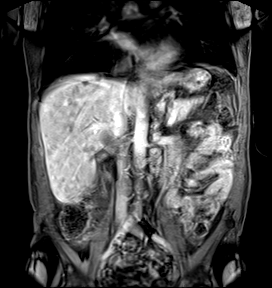
[im 72/72]
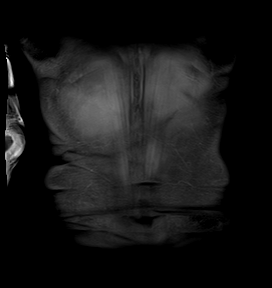

[Series 26: T1 dynamic post-contrast · axial · 3.0mm · 1.19mm/px · z∈[-157,+80]mm · 3 of 80 slices shown (5 of 6)]
[im 1/80]
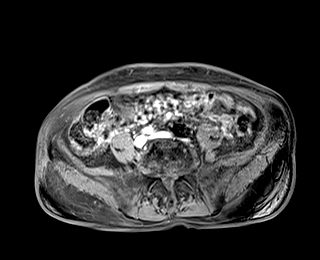
[im 40/80]
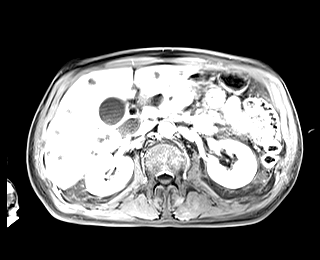
[im 80/80]
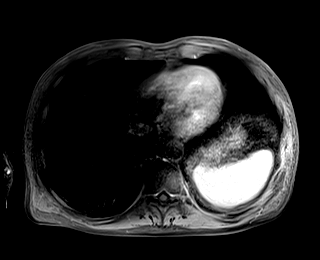

[Series 27: T1 dynamic post-contrast · axial · 3.0mm · 1.19mm/px · z∈[-157,+80]mm · 3 of 80 slices shown (6 of 6)]
[im 1/80]
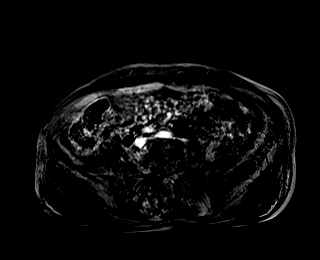
[im 40/80]
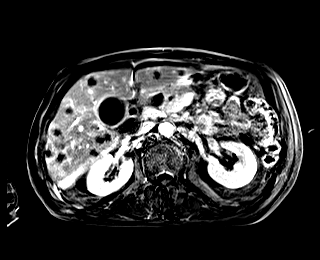
[im 80/80]
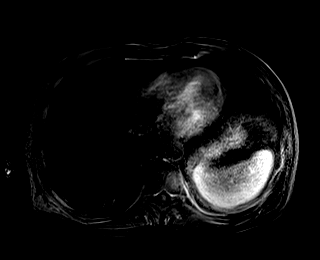

[45 of 48 positions shown; findings below may reference images not displayed]

FINDINGS: Lower chest: Unremarkable.

Hepatobiliary: Innumerable T1 hypointense, T2 hyperintense,
diffusion restricting centrally hypovascular peripherally enhancing
lesions are scattered throughout all aspects of the hepatic
parenchyma, increased in number and size compared to the prior CT
examination, compatible with widespread metastatic disease. Among
the largest of these lesions is a lesion in segment 8 of the liver
(axial image 15 of series 21) measuring 2.2 x 1.5 cm. No
intrahepatic biliary ductal dilatation. Common bile duct is dilated
measuring 1.4 cm in the porta hepatis, obstructed by the large
pancreatic mass (discussed below). Gallbladder is dilated with
multiple small filling defects within the gallbladder compatible
with gallstones. Gallbladder wall does not appear thickened and
there is no definite pericholecystic fluid or inflammatory changes
to clearly indicate an associated cholecystitis at this time.

Pancreas: Large mass in the head of the pancreas which is slightly
low T1 signal intensity, slightly high T2 signal intensity and is
generally hypovascular, estimated to measure approximately 2.4 x
x 3.1 cm (axial image 51 of series 23 and coronal image 26 of series
25). This appears to occlude the common bile duct. At this time, the
main pancreatic duct is ectatic but not dilated measuring up to 3 mm
in the body of the pancreas. Mild increased T2 signal intensity
surrounding the pancreas, which could reflect mild pancreatitis. No
well-defined peripancreatic fluid collection to suggest pseudocyst
at this time.

Spleen:  Unremarkable.

Adrenals/Urinary Tract: In the lateral aspect of the interpolar
region of the right kidney (axial image 20 of series 4) there is a
1.4 cm lesion which is generally T1 hypointense (with mild
heterogeneity in signal intensity), generally T2 hyperintense, with
some internal T2 hypointensity, which corresponds to areas of
internal nodular enhancement on post gadolinium images, concerning
for a small renal neoplasm. This is encapsulated within Gerota's
fascia and is well separated from the right renal vein which is
widely patent. Left kidney and bilateral adrenal glands are normal
in appearance. No hydroureteronephrosis in the visualized portions
of the abdomen.

Stomach/Bowel: Visualized portions are unremarkable.

Vascular/Lymphatic: Aortic atherosclerosis, without evidence of
aneurysm or dissection in the abdominal vasculature. Previously
described pancreatic mass appears separated from both the superior
mesenteric artery and vein, both of which are patent at this time.
Splenic vein, splenoportal confluence and portal vein are also
widely patent. Mildly enlarged portacaval lymph node measuring
cm in short axis (axial image 30 of series 23), which demonstrates
diffusion restriction, suspicious for a metastatic node.

Other:  Trace volume of perihepatic ascites.

Musculoskeletal: No definite suspicious appearing osseous lesions
are confidently identified on today's examination.
IMPRESSION: 1. Progression of disease with enlargement of the primary lesion in
the head of the pancreas, increased number and size of numerous
metastatic lesions throughout the liver, and probable metastatic
portacaval lymph node, as detailed above.
2. The pancreatic head mass is obstructing the common bile duct
resulting in extrahepatic biliary ductal dilatation.
3. 1.4 cm lesion in the lateral aspect of the interpolar region of
the right kidney highly suspicious for primary renal cell carcinoma.
This is encapsulated within Gerota's fascia, and is well separated
from the right renal vein which is patent.
4. Cholelithiasis.
5. Additional incidental findings, as above.
